# Patient Record
Sex: Male | Born: 1960
Health system: Southern US, Community
[De-identification: ages and names within clinical notes are randomized; demographics above are authoritative.]

## PROBLEM LIST (undated history)

## (undated) DIAGNOSIS — R932 Abnormal findings on diagnostic imaging of liver and biliary tract: Secondary | ICD-10-CM

## (undated) DIAGNOSIS — F329 Major depressive disorder, single episode, unspecified: Secondary | ICD-10-CM

## (undated) DIAGNOSIS — K766 Portal hypertension: Secondary | ICD-10-CM

## (undated) DIAGNOSIS — Z8619 Personal history of other infectious and parasitic diseases: Secondary | ICD-10-CM

## (undated) DIAGNOSIS — F319 Bipolar disorder, unspecified: Secondary | ICD-10-CM

## (undated) DIAGNOSIS — F419 Anxiety disorder, unspecified: Secondary | ICD-10-CM

## (undated) DIAGNOSIS — K3189 Other diseases of stomach and duodenum: Secondary | ICD-10-CM

## (undated) DIAGNOSIS — M539 Dorsopathy, unspecified: Secondary | ICD-10-CM

## (undated) DIAGNOSIS — I10 Essential (primary) hypertension: Secondary | ICD-10-CM

## (undated) DIAGNOSIS — Z972 Presence of dental prosthetic device (complete) (partial): Secondary | ICD-10-CM

## (undated) DIAGNOSIS — K0889 Other specified disorders of teeth and supporting structures: Secondary | ICD-10-CM

## (undated) DIAGNOSIS — M489 Spondylopathy, unspecified: Secondary | ICD-10-CM

## (undated) DIAGNOSIS — K7460 Unspecified cirrhosis of liver: Secondary | ICD-10-CM

## (undated) DIAGNOSIS — I8500 Esophageal varices without bleeding: Secondary | ICD-10-CM

## (undated) DIAGNOSIS — K2990 Gastroduodenitis, unspecified, without bleeding: Secondary | ICD-10-CM

## (undated) DIAGNOSIS — F4310 Post-traumatic stress disorder, unspecified: Secondary | ICD-10-CM

## (undated) DIAGNOSIS — K219 Gastro-esophageal reflux disease without esophagitis: Secondary | ICD-10-CM

## (undated) DIAGNOSIS — R519 Headache, unspecified: Secondary | ICD-10-CM

## (undated) DIAGNOSIS — R51 Headache: Secondary | ICD-10-CM

## (undated) DIAGNOSIS — F32A Depression, unspecified: Secondary | ICD-10-CM

## (undated) HISTORY — DX: Abnormal findings on diagnostic imaging of liver and biliary tract: R93.2

## (undated) HISTORY — DX: Gastro-esophageal reflux disease without esophagitis: K21.9

## (undated) HISTORY — DX: Major depressive disorder, single episode, unspecified: F32.9

## (undated) HISTORY — DX: Bipolar disorder, unspecified: F31.9

## (undated) HISTORY — PX: FACIAL RECONSTRUCTION SURGERY: SHX631

## (undated) HISTORY — DX: Depression, unspecified: F32.A

## (undated) HISTORY — DX: Essential (primary) hypertension: I10

---

## 2011-04-23 DIAGNOSIS — IMO0002 Reserved for concepts with insufficient information to code with codable children: Secondary | ICD-10-CM | POA: Diagnosis not present

## 2011-04-23 DIAGNOSIS — M7989 Other specified soft tissue disorders: Secondary | ICD-10-CM | POA: Diagnosis not present

## 2011-04-26 DIAGNOSIS — A419 Sepsis, unspecified organism: Secondary | ICD-10-CM | POA: Diagnosis not present

## 2011-04-26 DIAGNOSIS — A491 Streptococcal infection, unspecified site: Secondary | ICD-10-CM | POA: Diagnosis present

## 2011-04-26 DIAGNOSIS — F319 Bipolar disorder, unspecified: Secondary | ICD-10-CM | POA: Diagnosis not present

## 2011-04-26 DIAGNOSIS — F17200 Nicotine dependence, unspecified, uncomplicated: Secondary | ICD-10-CM | POA: Diagnosis present

## 2011-04-26 DIAGNOSIS — M79609 Pain in unspecified limb: Secondary | ICD-10-CM | POA: Diagnosis not present

## 2011-04-26 DIAGNOSIS — M549 Dorsalgia, unspecified: Secondary | ICD-10-CM | POA: Diagnosis not present

## 2011-04-26 DIAGNOSIS — IMO0002 Reserved for concepts with insufficient information to code with codable children: Secondary | ICD-10-CM | POA: Diagnosis not present

## 2011-04-26 DIAGNOSIS — Z7189 Other specified counseling: Secondary | ICD-10-CM | POA: Diagnosis not present

## 2012-05-15 DIAGNOSIS — J019 Acute sinusitis, unspecified: Secondary | ICD-10-CM | POA: Diagnosis not present

## 2012-06-13 DIAGNOSIS — F17200 Nicotine dependence, unspecified, uncomplicated: Secondary | ICD-10-CM | POA: Diagnosis not present

## 2012-06-13 DIAGNOSIS — F411 Generalized anxiety disorder: Secondary | ICD-10-CM | POA: Diagnosis not present

## 2012-06-13 DIAGNOSIS — H53149 Visual discomfort, unspecified: Secondary | ICD-10-CM | POA: Diagnosis not present

## 2012-06-13 DIAGNOSIS — G43909 Migraine, unspecified, not intractable, without status migrainosus: Secondary | ICD-10-CM | POA: Diagnosis not present

## 2012-06-13 DIAGNOSIS — G44009 Cluster headache syndrome, unspecified, not intractable: Secondary | ICD-10-CM | POA: Diagnosis not present

## 2012-07-26 DIAGNOSIS — R197 Diarrhea, unspecified: Secondary | ICD-10-CM | POA: Diagnosis not present

## 2012-09-30 DIAGNOSIS — R51 Headache: Secondary | ICD-10-CM | POA: Diagnosis not present

## 2012-09-30 DIAGNOSIS — M549 Dorsalgia, unspecified: Secondary | ICD-10-CM | POA: Diagnosis not present

## 2012-09-30 DIAGNOSIS — L259 Unspecified contact dermatitis, unspecified cause: Secondary | ICD-10-CM | POA: Diagnosis not present

## 2012-09-30 DIAGNOSIS — I10 Essential (primary) hypertension: Secondary | ICD-10-CM | POA: Diagnosis not present

## 2012-10-24 DIAGNOSIS — R7989 Other specified abnormal findings of blood chemistry: Secondary | ICD-10-CM | POA: Diagnosis not present

## 2012-10-24 DIAGNOSIS — R51 Headache: Secondary | ICD-10-CM | POA: Diagnosis not present

## 2012-10-24 DIAGNOSIS — I10 Essential (primary) hypertension: Secondary | ICD-10-CM | POA: Diagnosis not present

## 2012-11-01 DIAGNOSIS — M549 Dorsalgia, unspecified: Secondary | ICD-10-CM | POA: Diagnosis not present

## 2012-11-01 DIAGNOSIS — B1920 Unspecified viral hepatitis C without hepatic coma: Secondary | ICD-10-CM | POA: Diagnosis not present

## 2012-11-01 DIAGNOSIS — I10 Essential (primary) hypertension: Secondary | ICD-10-CM | POA: Diagnosis not present

## 2012-11-20 ENCOUNTER — Ambulatory Visit: Payer: Self-pay | Admitting: Surgery

## 2012-11-20 DIAGNOSIS — B182 Chronic viral hepatitis C: Secondary | ICD-10-CM | POA: Diagnosis not present

## 2012-11-20 LAB — PROTIME-INR
INR: 1
Prothrombin Time: 13.5 secs (ref 11.5–14.7)

## 2012-11-20 LAB — FERRITIN: Ferritin (ARMC): 478 ng/mL — ABNORMAL HIGH (ref 8–388)

## 2012-11-25 ENCOUNTER — Ambulatory Visit: Payer: Self-pay | Admitting: Gastroenterology

## 2012-11-25 DIAGNOSIS — B182 Chronic viral hepatitis C: Secondary | ICD-10-CM | POA: Diagnosis not present

## 2012-11-25 DIAGNOSIS — B1920 Unspecified viral hepatitis C without hepatic coma: Secondary | ICD-10-CM | POA: Diagnosis not present

## 2012-12-06 DIAGNOSIS — M549 Dorsalgia, unspecified: Secondary | ICD-10-CM | POA: Diagnosis not present

## 2012-12-06 DIAGNOSIS — J309 Allergic rhinitis, unspecified: Secondary | ICD-10-CM | POA: Diagnosis not present

## 2012-12-06 DIAGNOSIS — I10 Essential (primary) hypertension: Secondary | ICD-10-CM | POA: Diagnosis not present

## 2012-12-06 DIAGNOSIS — F411 Generalized anxiety disorder: Secondary | ICD-10-CM | POA: Diagnosis not present

## 2012-12-18 ENCOUNTER — Ambulatory Visit: Payer: Self-pay | Admitting: Pain Medicine

## 2012-12-18 DIAGNOSIS — M545 Low back pain, unspecified: Secondary | ICD-10-CM | POA: Diagnosis not present

## 2012-12-18 DIAGNOSIS — F411 Generalized anxiety disorder: Secondary | ICD-10-CM | POA: Diagnosis not present

## 2012-12-18 DIAGNOSIS — IMO0002 Reserved for concepts with insufficient information to code with codable children: Secondary | ICD-10-CM | POA: Diagnosis not present

## 2012-12-18 DIAGNOSIS — F3289 Other specified depressive episodes: Secondary | ICD-10-CM | POA: Diagnosis not present

## 2012-12-18 DIAGNOSIS — F410 Panic disorder [episodic paroxysmal anxiety] without agoraphobia: Secondary | ICD-10-CM | POA: Diagnosis not present

## 2012-12-18 DIAGNOSIS — M47817 Spondylosis without myelopathy or radiculopathy, lumbosacral region: Secondary | ICD-10-CM | POA: Diagnosis not present

## 2012-12-18 DIAGNOSIS — F329 Major depressive disorder, single episode, unspecified: Secondary | ICD-10-CM | POA: Diagnosis not present

## 2012-12-18 DIAGNOSIS — M79609 Pain in unspecified limb: Secondary | ICD-10-CM | POA: Diagnosis not present

## 2013-01-08 DIAGNOSIS — F312 Bipolar disorder, current episode manic severe with psychotic features: Secondary | ICD-10-CM | POA: Diagnosis not present

## 2013-01-08 DIAGNOSIS — K219 Gastro-esophageal reflux disease without esophagitis: Secondary | ICD-10-CM | POA: Diagnosis not present

## 2013-01-08 DIAGNOSIS — I10 Essential (primary) hypertension: Secondary | ICD-10-CM | POA: Diagnosis not present

## 2013-01-08 DIAGNOSIS — Z23 Encounter for immunization: Secondary | ICD-10-CM | POA: Diagnosis not present

## 2013-04-02 DIAGNOSIS — I10 Essential (primary) hypertension: Secondary | ICD-10-CM | POA: Diagnosis not present

## 2013-04-02 DIAGNOSIS — M549 Dorsalgia, unspecified: Secondary | ICD-10-CM | POA: Diagnosis not present

## 2013-04-02 DIAGNOSIS — L0291 Cutaneous abscess, unspecified: Secondary | ICD-10-CM | POA: Diagnosis not present

## 2013-04-02 DIAGNOSIS — J398 Other specified diseases of upper respiratory tract: Secondary | ICD-10-CM | POA: Diagnosis not present

## 2013-04-10 DIAGNOSIS — K219 Gastro-esophageal reflux disease without esophagitis: Secondary | ICD-10-CM | POA: Diagnosis not present

## 2013-04-10 DIAGNOSIS — F312 Bipolar disorder, current episode manic severe with psychotic features: Secondary | ICD-10-CM | POA: Diagnosis not present

## 2013-04-10 DIAGNOSIS — M549 Dorsalgia, unspecified: Secondary | ICD-10-CM | POA: Diagnosis not present

## 2013-04-10 DIAGNOSIS — I10 Essential (primary) hypertension: Secondary | ICD-10-CM | POA: Diagnosis not present

## 2013-05-12 DIAGNOSIS — H40019 Open angle with borderline findings, low risk, unspecified eye: Secondary | ICD-10-CM | POA: Diagnosis not present

## 2013-07-11 DIAGNOSIS — F312 Bipolar disorder, current episode manic severe with psychotic features: Secondary | ICD-10-CM | POA: Diagnosis not present

## 2013-07-11 DIAGNOSIS — K219 Gastro-esophageal reflux disease without esophagitis: Secondary | ICD-10-CM | POA: Diagnosis not present

## 2013-07-11 DIAGNOSIS — I10 Essential (primary) hypertension: Secondary | ICD-10-CM | POA: Diagnosis not present

## 2013-07-11 DIAGNOSIS — M549 Dorsalgia, unspecified: Secondary | ICD-10-CM | POA: Diagnosis not present

## 2013-07-15 DIAGNOSIS — F3132 Bipolar disorder, current episode depressed, moderate: Secondary | ICD-10-CM | POA: Diagnosis not present

## 2013-07-21 ENCOUNTER — Emergency Department: Payer: Self-pay | Admitting: Emergency Medicine

## 2013-07-21 DIAGNOSIS — M25469 Effusion, unspecified knee: Secondary | ICD-10-CM | POA: Diagnosis not present

## 2013-07-21 DIAGNOSIS — F17200 Nicotine dependence, unspecified, uncomplicated: Secondary | ICD-10-CM | POA: Diagnosis not present

## 2013-07-21 DIAGNOSIS — M1610 Unilateral primary osteoarthritis, unspecified hip: Secondary | ICD-10-CM | POA: Diagnosis not present

## 2013-07-21 DIAGNOSIS — I10 Essential (primary) hypertension: Secondary | ICD-10-CM | POA: Diagnosis not present

## 2013-07-21 DIAGNOSIS — M25459 Effusion, unspecified hip: Secondary | ICD-10-CM | POA: Diagnosis not present

## 2013-07-21 DIAGNOSIS — M169 Osteoarthritis of hip, unspecified: Secondary | ICD-10-CM | POA: Diagnosis not present

## 2013-07-24 ENCOUNTER — Emergency Department: Payer: Self-pay | Admitting: Emergency Medicine

## 2013-07-24 DIAGNOSIS — G8929 Other chronic pain: Secondary | ICD-10-CM | POA: Diagnosis not present

## 2013-07-24 DIAGNOSIS — M25559 Pain in unspecified hip: Secondary | ICD-10-CM | POA: Diagnosis not present

## 2013-07-24 DIAGNOSIS — I10 Essential (primary) hypertension: Secondary | ICD-10-CM | POA: Diagnosis not present

## 2013-07-24 DIAGNOSIS — M25569 Pain in unspecified knee: Secondary | ICD-10-CM | POA: Diagnosis not present

## 2013-07-24 DIAGNOSIS — F17200 Nicotine dependence, unspecified, uncomplicated: Secondary | ICD-10-CM | POA: Diagnosis not present

## 2013-08-01 ENCOUNTER — Encounter: Payer: Self-pay | Admitting: Physical Medicine & Rehabilitation

## 2013-08-20 ENCOUNTER — Emergency Department: Payer: Self-pay | Admitting: Emergency Medicine

## 2013-08-20 DIAGNOSIS — F17200 Nicotine dependence, unspecified, uncomplicated: Secondary | ICD-10-CM | POA: Diagnosis not present

## 2013-08-20 DIAGNOSIS — S61209A Unspecified open wound of unspecified finger without damage to nail, initial encounter: Secondary | ICD-10-CM | POA: Diagnosis not present

## 2013-08-20 DIAGNOSIS — Z79899 Other long term (current) drug therapy: Secondary | ICD-10-CM | POA: Diagnosis not present

## 2013-08-20 DIAGNOSIS — S6000XA Contusion of unspecified finger without damage to nail, initial encounter: Secondary | ICD-10-CM | POA: Diagnosis not present

## 2013-08-20 DIAGNOSIS — I10 Essential (primary) hypertension: Secondary | ICD-10-CM | POA: Diagnosis not present

## 2013-09-05 ENCOUNTER — Encounter: Payer: Self-pay | Admitting: Physical Medicine & Rehabilitation

## 2013-09-05 ENCOUNTER — Ambulatory Visit (HOSPITAL_BASED_OUTPATIENT_CLINIC_OR_DEPARTMENT_OTHER): Payer: Medicare Other | Admitting: Physical Medicine & Rehabilitation

## 2013-09-05 ENCOUNTER — Encounter: Payer: Medicare Other | Attending: Physical Medicine & Rehabilitation

## 2013-09-05 VITALS — BP 136/95 | HR 81 | Resp 14 | Wt 243.8 lb

## 2013-09-05 DIAGNOSIS — M545 Low back pain, unspecified: Secondary | ICD-10-CM | POA: Diagnosis not present

## 2013-09-05 DIAGNOSIS — K219 Gastro-esophageal reflux disease without esophagitis: Secondary | ICD-10-CM | POA: Diagnosis not present

## 2013-09-05 DIAGNOSIS — M25569 Pain in unspecified knee: Secondary | ICD-10-CM

## 2013-09-05 DIAGNOSIS — Z5181 Encounter for therapeutic drug level monitoring: Secondary | ICD-10-CM

## 2013-09-05 DIAGNOSIS — F319 Bipolar disorder, unspecified: Secondary | ICD-10-CM | POA: Insufficient documentation

## 2013-09-05 DIAGNOSIS — M549 Dorsalgia, unspecified: Secondary | ICD-10-CM | POA: Diagnosis not present

## 2013-09-05 DIAGNOSIS — R209 Unspecified disturbances of skin sensation: Secondary | ICD-10-CM | POA: Insufficient documentation

## 2013-09-05 DIAGNOSIS — G8929 Other chronic pain: Secondary | ICD-10-CM | POA: Diagnosis not present

## 2013-09-05 DIAGNOSIS — I10 Essential (primary) hypertension: Secondary | ICD-10-CM | POA: Insufficient documentation

## 2013-09-05 DIAGNOSIS — Z79899 Other long term (current) drug therapy: Secondary | ICD-10-CM | POA: Diagnosis not present

## 2013-09-05 DIAGNOSIS — S22000A Wedge compression fracture of unspecified thoracic vertebra, initial encounter for closed fracture: Secondary | ICD-10-CM | POA: Insufficient documentation

## 2013-09-05 DIAGNOSIS — S22009A Unspecified fracture of unspecified thoracic vertebra, initial encounter for closed fracture: Secondary | ICD-10-CM

## 2013-09-05 DIAGNOSIS — F17200 Nicotine dependence, unspecified, uncomplicated: Secondary | ICD-10-CM | POA: Insufficient documentation

## 2013-09-05 DIAGNOSIS — M25559 Pain in unspecified hip: Secondary | ICD-10-CM | POA: Diagnosis not present

## 2013-09-05 MED ORDER — TRAMADOL HCL 50 MG PO TABS: 50.0000 mg | ORAL_TABLET | Freq: Four times a day (QID) | ORAL | Status: DC

## 2013-09-05 NOTE — Patient Instructions (Signed)
Carpal Tunnel Syndrome The carpal tunnel is an area under the skin of the palm of your hand. Nerves, blood vessels, and strong tissues (tendons) pass through the tunnel. The tunnel can become puffy (swollen). If this happens, a nerve can be pinched in the wrist. This causes carpal tunnel syndrome.  HOME CARE  Take all medicine as told by your doctor.  If you were given a splint, wear it as told. Wear it at night or at times when your doctor told you to.  Rest your wrist from the activity that causes your pain.  Put ice on your wrist after long periods of wrist activity.  Put ice in a plastic bag.  Place a towel between your skin and the bag.  Leave the ice on for 15-20 minutes, 03-04 times a day.  Keep all doctor visits as told. GET HELP RIGHT AWAY IF:  You have new problems you cannot explain.  Your problems get worse and medicine does not help. MAKE SURE YOU:   Understand these instructions.  Will watch your condition.  Will get help right away if you are not doing well or get worse. Document Released: 03/09/2011 Document Revised: 06/12/2011 Document Reviewed: 03/09/2011 ExitCare Patient Information 2014 [employer].  

## 2013-09-05 NOTE — Progress Notes (Signed)
Subjective:    Patient ID: Christian Sanders, male    DOB: 12/13/1960, 53 y.o.   MRN: [id number]  HPI Chief complaint mid back pain Onset of pain 2001 after a fall. Reviewed MRIs from 2008 in 2009 which showed compression fracture, old at either T78 or T89, patient has not had any surgery for this in the past. Back pain is worse early in the morning as well as later in the evening. Standing bending and lifting seems to aggravate the pain.  Also has left hip pain that radiates down to the knee. X-rays from earlier this year demonstrate mild left sacroiliac joint arthritis, no knee osteoarthritis  On disability since 2010 for back pain and bipolar disorder.  Currently sees a psychiatrist for bipolar disorder. Also sees primary care physician for hypertension  Pain Inventory Average Pain 10 Pain Right Now 10 My pain is sharp, burning, dull, stabbing and aching  In the last 24 hours, has pain interfered with the following? General activity 10 Relation with others 8 Enjoyment of life 10 What TIME of day is your pain at its worst? all Sleep (in general) Poor  Pain is worse with: walking, bending, sitting, inactivity, standing, unsure and some activites Pain improves with: medication Relief from Meds: 8  Mobility walk without assistance how many minutes can you walk? 10 ability to climb steps?  yes do you drive?  yes  Function disabled: date disabled 2010 I need assistance with the following:  household duties and shopping  Neuro/Psych weakness numbness tingling trouble walking spasms dizziness depression anxiety  Prior Studies Any changes since last visit?  yes  Physicians involved in your care Primary care Venora MaplesJames Hawkins   No family history on file. History   Social History  . Marital Status: Unknown    Spouse Name: N/A    Number of Children: N/A  . Years of Education: N/A   Social History Main Topics  . Smoking status: Current Every Day Smoker -- 0.50  packs/day for 30 years    Types: Cigarettes  . Smokeless tobacco: Never Used  . Alcohol Use: None  . Drug Use: None  . Sexual Activity: None   Other Topics Concern  . None   Social History Narrative  . None   Past Surgical History  Procedure Laterality Date  . Facial reconstruction surgery      eye socket with 2 metal plates   Past Medical History  Diagnosis Date  . Hypertension   . GERD (gastroesophageal reflux disease)   . Depression   . Bipolar disorder    BP 136/95  Pulse 81  Resp 14  Wt 243 lb 12.8 oz (110.587 kg)  SpO2 97%  Opioid Risk Score: 3 Fall Risk Score: Low Fall Risk (0-5 points) (educated and handout on fall prevention inthe home was given) Review of Systems  Constitutional: Positive for unexpected weight change.  Gastrointestinal: Positive for diarrhea.  Musculoskeletal: Positive for gait problem.       Spasms  Neurological: Positive for dizziness, weakness and numbness.       Tingling  Psychiatric/Behavioral: Positive for dysphoric mood. The patient is nervous/anxious.   All other systems reviewed and are negative.      Objective:   Physical Exam  Nursing note and vitals reviewed. Constitutional: He is oriented to person, place, and time. He appears well-developed and well-nourished.  HENT:  Head: Normocephalic and atraumatic.  Eyes: Conjunctivae and EOM are normal. Pupils are equal, round, and reactive to light.  Neck:  Normal range of motion. Neck supple.  Cardiovascular: Normal rate, regular rhythm and normal heart sounds.   Pulmonary/Chest: Effort normal and breath sounds normal.  Abdominal: Soft. Bowel sounds are normal.  Musculoskeletal:       Right shoulder: Normal.       Right hip: Normal.       Left hip: Normal.       Right knee: Normal.       Left knee: Normal.       Thoracic back: He exhibits decreased range of motion.       Lumbar back: He exhibits decreased range of motion.  Phalen's Positive  Neurological: He is alert and  oriented to person, place, and time. He has normal strength. No sensory deficit. He displays a negative Romberg sign.  Reflex Scores:      Tricep reflexes are 1+ on the right side and 1+ on the left side.      Bicep reflexes are 1+ on the right side and 1+ on the left side.      Brachioradialis reflexes are 1+ on the right side and 1+ on the left side.      Patellar reflexes are 1+ on the right side and 1+ on the left side.      Achilles reflexes are 1+ on the right side and 1+ on the left side. Psychiatric: He has a normal mood and affect.          Assessment & Plan:  1. Chronic mid back pain. Has chronic depression deformity around T 7-T8 which is likely causing abnormal biomechanical stress to that area. Has been on Percocet in the past. Has not tried other narcotic analgesics. Opioid risks score is low  Recommend multimodal therapy including exercise, medication and injection. Steps approach to narcotic analgesics starting with tramadol 50 mg 4 times per day. If this is not helpful would go up to Tylenol #3  May need another round of physical therapy for thoracic extension exercises  2. Bilateral hand numbness with positive Phalen's sign, suspect carpal tunnel but we'll need to EMG to further assess in the meantime can start with wrist orthosis at night  3. Left-sided low back pain radiating to the hip as well as knee. Do not think he has primary hip or knee pathology based on x-rays as well as examination. This may be radiating pain from the low back or the sacroiliac area. We'll recommend sacroiliac injections given arthrosis noted on x-ray

## 2013-09-09 ENCOUNTER — Telehealth: Payer: Self-pay

## 2013-09-09 NOTE — Telephone Encounter (Signed)
Contacted patient's EC to inform her that Dr. Wynn Banker advised patient to stop the Tramadol for 2 days to see if the symptoms go away. Patient is nauseated and has a rash. Patient will notify us in 2 days and let us know if the symptoms go away.

## 2013-09-09 NOTE — Telephone Encounter (Signed)
Stop tramadol for 2 days and see if symptoms go away Please clarify is this nausea or vomiting?

## 2013-09-09 NOTE — Telephone Encounter (Signed)
Christian Sanders called on behalf of patient.  He thinks the tramadol is making him sick.  They would like to know what to do?

## 2013-09-09 NOTE — Telephone Encounter (Signed)
Attempted to contact patient/EC. Left a voicemail to return call to clinic.

## 2013-09-12 ENCOUNTER — Telehealth: Payer: Self-pay

## 2013-09-12 NOTE — Telephone Encounter (Signed)
Christian Sanders, patients fiance called to let us know that stopping the tramadol has stopped the rash.  They would like to know what to do next.  Please advise.

## 2013-09-12 NOTE — Telephone Encounter (Signed)
Advised patient we would need to get urine drug screen back before any other medication could be prescribed.

## 2013-09-12 NOTE — Telephone Encounter (Signed)
Need results of your Urine drug screen prior to a stronger narcotic such as Tylenol #3

## 2013-09-18 ENCOUNTER — Telehealth: Payer: Self-pay

## 2013-09-18 NOTE — Telephone Encounter (Signed)
Christian Sanders called on pt's behalf requesting a stronger medication for patient. Informed patient that per Dr. Wynn BankerKirsteins he would need to be seen before prescribing anything stronger. The reason being is Dr. Wynn BankerKirsteins wants a UDS did not inform pt that he needed a UDS though.

## 2013-09-30 DIAGNOSIS — F315 Bipolar disorder, current episode depressed, severe, with psychotic features: Secondary | ICD-10-CM | POA: Diagnosis not present

## 2013-10-14 DIAGNOSIS — M549 Dorsalgia, unspecified: Secondary | ICD-10-CM | POA: Diagnosis not present

## 2013-10-14 DIAGNOSIS — I10 Essential (primary) hypertension: Secondary | ICD-10-CM | POA: Diagnosis not present

## 2013-10-14 DIAGNOSIS — K219 Gastro-esophageal reflux disease without esophagitis: Secondary | ICD-10-CM | POA: Diagnosis not present

## 2013-10-14 DIAGNOSIS — F312 Bipolar disorder, current episode manic severe with psychotic features: Secondary | ICD-10-CM | POA: Diagnosis not present

## 2013-10-16 DIAGNOSIS — F315 Bipolar disorder, current episode depressed, severe, with psychotic features: Secondary | ICD-10-CM | POA: Diagnosis not present

## 2013-10-23 ENCOUNTER — Encounter: Payer: Medicare Other | Attending: Physical Medicine & Rehabilitation

## 2013-10-23 ENCOUNTER — Encounter: Payer: Self-pay | Admitting: Physical Medicine & Rehabilitation

## 2013-10-23 ENCOUNTER — Ambulatory Visit (HOSPITAL_BASED_OUTPATIENT_CLINIC_OR_DEPARTMENT_OTHER): Payer: Medicare Other | Admitting: Physical Medicine & Rehabilitation

## 2013-10-23 VITALS — BP 166/97 | HR 99 | Resp 16 | Ht 74.0 in | Wt 245.0 lb

## 2013-10-23 DIAGNOSIS — M25569 Pain in unspecified knee: Secondary | ICD-10-CM | POA: Insufficient documentation

## 2013-10-23 DIAGNOSIS — M549 Dorsalgia, unspecified: Secondary | ICD-10-CM | POA: Insufficient documentation

## 2013-10-23 DIAGNOSIS — F319 Bipolar disorder, unspecified: Secondary | ICD-10-CM | POA: Insufficient documentation

## 2013-10-23 DIAGNOSIS — I10 Essential (primary) hypertension: Secondary | ICD-10-CM | POA: Diagnosis not present

## 2013-10-23 DIAGNOSIS — F17200 Nicotine dependence, unspecified, uncomplicated: Secondary | ICD-10-CM | POA: Insufficient documentation

## 2013-10-23 DIAGNOSIS — K219 Gastro-esophageal reflux disease without esophagitis: Secondary | ICD-10-CM | POA: Insufficient documentation

## 2013-10-23 DIAGNOSIS — M25559 Pain in unspecified hip: Secondary | ICD-10-CM | POA: Insufficient documentation

## 2013-10-23 DIAGNOSIS — M79642 Pain in left hand: Secondary | ICD-10-CM

## 2013-10-23 DIAGNOSIS — R209 Unspecified disturbances of skin sensation: Secondary | ICD-10-CM | POA: Diagnosis not present

## 2013-10-23 DIAGNOSIS — M79609 Pain in unspecified limb: Secondary | ICD-10-CM

## 2013-10-23 DIAGNOSIS — G8929 Other chronic pain: Secondary | ICD-10-CM | POA: Insufficient documentation

## 2013-10-23 MED ORDER — ACETAMINOPHEN-CODEINE #3 300-30 MG PO TABS: 1.0000 | ORAL_TABLET | Freq: Three times a day (TID) | ORAL | Status: DC | PRN

## 2013-10-23 NOTE — Patient Instructions (Signed)
Electromyography (EMG) Test This is a test in which very small electrodes are placed into your muscle tissue. It looks at the electrical impulses of your muscle tissue. This test is used to determine whether or not there are involuntary or spontaneous muscle movements. Involuntary or spontaneous means muscle movements that happen by themselves. This may indicate injury or disease of the nerves which supply that muscle. PREPARATION FOR TEST No preparation or fasting is necessary. Some stimulants such as caffeine and tobacco may need to be avoided for 2-3 hours before test or as instructed by your caregiver. NORMAL FINDINGS No evidence of neuromuscular abnormalities. Ranges for normal findings may vary among different laboratories and hospitals. You should always check with your doctor after having lab work or other tests done to discuss the meaning of your test results and whether your values are considered within normal limits. MEANING OF TEST  Your caregiver will go over the test results with you and discuss the importance and meaning of your results, as well as treatment options and the need for additional tests if necessary. OBTAINING THE TEST RESULTS It is your responsibility to obtain your test results. Ask the lab or department performing the test when and how you will get your results. Document Released: 07/21/2004 Document Revised: 06/12/2011 Document Reviewed: 02/28/2008 ExitCare Patient Information 2015 [employer]. This information is not intended to replace advice given to you by your health care provider. Make sure you discuss any questions you have with your health care provider.  

## 2013-10-23 NOTE — Progress Notes (Signed)
EMG performed 10/23/2013.  See EMG report under media tab.

## 2013-12-11 ENCOUNTER — Ambulatory Visit: Payer: Medicare Other | Admitting: Physical Medicine & Rehabilitation

## 2013-12-12 DIAGNOSIS — J069 Acute upper respiratory infection, unspecified: Secondary | ICD-10-CM | POA: Diagnosis not present

## 2013-12-12 DIAGNOSIS — F312 Bipolar disorder, current episode manic severe with psychotic features: Secondary | ICD-10-CM | POA: Diagnosis not present

## 2013-12-12 DIAGNOSIS — J329 Chronic sinusitis, unspecified: Secondary | ICD-10-CM | POA: Diagnosis not present

## 2013-12-16 ENCOUNTER — Ambulatory Visit (HOSPITAL_BASED_OUTPATIENT_CLINIC_OR_DEPARTMENT_OTHER): Payer: Medicare Other | Admitting: Physical Medicine & Rehabilitation

## 2013-12-16 ENCOUNTER — Encounter: Payer: Self-pay | Admitting: Physical Medicine & Rehabilitation

## 2013-12-16 ENCOUNTER — Encounter: Payer: Medicare Other | Attending: Physical Medicine & Rehabilitation

## 2013-12-16 VITALS — BP 139/84 | HR 83 | Resp 16 | Ht 75.0 in | Wt 247.0 lb

## 2013-12-16 DIAGNOSIS — M549 Dorsalgia, unspecified: Secondary | ICD-10-CM

## 2013-12-16 DIAGNOSIS — G8929 Other chronic pain: Secondary | ICD-10-CM | POA: Diagnosis not present

## 2013-12-16 DIAGNOSIS — R209 Unspecified disturbances of skin sensation: Secondary | ICD-10-CM | POA: Insufficient documentation

## 2013-12-16 DIAGNOSIS — M25569 Pain in unspecified knee: Secondary | ICD-10-CM | POA: Insufficient documentation

## 2013-12-16 DIAGNOSIS — F319 Bipolar disorder, unspecified: Secondary | ICD-10-CM | POA: Diagnosis not present

## 2013-12-16 DIAGNOSIS — I10 Essential (primary) hypertension: Secondary | ICD-10-CM | POA: Diagnosis not present

## 2013-12-16 DIAGNOSIS — F17200 Nicotine dependence, unspecified, uncomplicated: Secondary | ICD-10-CM | POA: Diagnosis not present

## 2013-12-16 DIAGNOSIS — K219 Gastro-esophageal reflux disease without esophagitis: Secondary | ICD-10-CM | POA: Insufficient documentation

## 2013-12-16 DIAGNOSIS — IMO0002 Reserved for concepts with insufficient information to code with codable children: Secondary | ICD-10-CM

## 2013-12-16 DIAGNOSIS — M25559 Pain in unspecified hip: Secondary | ICD-10-CM | POA: Insufficient documentation

## 2013-12-16 DIAGNOSIS — M546 Pain in thoracic spine: Secondary | ICD-10-CM

## 2013-12-16 DIAGNOSIS — S22000S Wedge compression fracture of unspecified thoracic vertebra, sequela: Secondary | ICD-10-CM

## 2013-12-16 MED ORDER — ACETAMINOPHEN-CODEINE #3 300-30 MG PO TABS: 1.0000 | ORAL_TABLET | Freq: Three times a day (TID) | ORAL | Status: DC | PRN

## 2013-12-16 NOTE — Patient Instructions (Signed)
Continue walking every day.

## 2013-12-16 NOTE — Progress Notes (Signed)
Subjective:    Patient ID: Christian Sanders, male    DOB: [birth year], 53 y.o.   MRN: [id number]  HPI Hand pain and finger pain improving Discussed EMG results from 10/23/2013. This was essentially a normal study. No further workup of hand pain needed unless symptoms increased once again Still walking 15-36min, 7days a week  Pain Inventory Average Pain 8 Pain Right Now 7 My pain is sharp, burning, stabbing and aching  In the last 24 hours, has pain interfered with the following? General activity 6 Relation with others 8 Enjoyment of life 8 What TIME of day is your pain at its worst? morning,evening Sleep (in general) Poor  Pain is worse with: walking, bending, sitting, inactivity, standing, unsure and some activites Pain improves with: medication Relief from Meds: 7  Mobility walk without assistance transfers alone  Function disabled: date disabled na  Neuro/Psych weakness numbness dizziness confusion depression anxiety  Prior Studies Any changes since last visit?  no  Physicians involved in your care Any changes since last visit?  no   History reviewed. No pertinent family history. History   Social History  . Marital Status: Unknown    Spouse Name: N/A    Number of Children: N/A  . Years of Education: N/A   Social History Main Topics  . Smoking status: Current Every Day Smoker -- 0.50 packs/day for 30 years    Types: Cigarettes  . Smokeless tobacco: Never Used  . Alcohol Use: None  . Drug Use: None  . Sexual Activity: None   Other Topics Concern  . None   Social History Narrative  . None   Past Surgical History  Procedure Laterality Date  . Facial reconstruction surgery      eye socket with 2 metal plates   Past Medical History  Diagnosis Date  . Hypertension   . GERD (gastroesophageal reflux disease)   . Depression   . Bipolar disorder    BP 139/84  Pulse 83  Resp 16  Ht  (1.905 m)  Wt 247 lb (112.038 kg)  BMI 30.87 kg/m2  SpO2  98%  Opioid Risk Score:   Fall Risk Score: Low Fall Risk (0-5 points)   Review of Systems  Constitutional: Positive for chills, diaphoresis and unexpected weight change.  Gastrointestinal: Positive for diarrhea.  Neurological: Positive for dizziness, weakness and numbness.  Psychiatric/Behavioral: Positive for confusion. The patient is nervous/anxious.        Anxiety  All other systems reviewed and are negative.      Objective:   Physical Exam  Nursing note and vitals reviewed. Constitutional: He is oriented to person, place, and time. He appears well-developed and well-nourished.  HENT:  Head: Normocephalic and atraumatic.  Eyes: Pupils are equal, round, and reactive to light.  Neurological: He is alert and oriented to person, place, and time.  Psychiatric: He has a normal mood and affect.    No evidence of joint tenderness or joint swelling Negative Tinel's test, at the wrist Normal sensation to light touch and pinprick in both hands No evidence of hand intrinsic atrophy  Tenderness to palpation around ET 67 area     Assessment & Plan:  1. Chronic mid back pain. Has chronic depression deformity around T 7-T8 which is likely causing abnormal biomechanical stress to that area. Has been on Percocet in the past. Has not tried other narcotic analgesics. Opioid risks score is low  Recommend multimodal therapy including exercise, medication and injection. Responsive to Tylenol #3  May need another round of physical therapy for thoracic extension exercises  2. Bilateral hand numbness improved. No evidence of compression neuropathy in the upper extremities. No need for repeat EMG at this point  3. Left-sided low back pain radiating to the hip as well as knee at this point appears to be well controlled on current medication and exercise program.. We'll recommend sacroiliac injections if this becomes more severe given arthrosis noted on x-ray

## 2013-12-17 ENCOUNTER — Encounter: Payer: Self-pay | Admitting: Physical Medicine & Rehabilitation

## 2013-12-18 ENCOUNTER — Encounter: Payer: Self-pay | Admitting: Physical Medicine & Rehabilitation

## 2013-12-23 DIAGNOSIS — Z79899 Other long term (current) drug therapy: Secondary | ICD-10-CM | POA: Diagnosis not present

## 2013-12-23 DIAGNOSIS — F315 Bipolar disorder, current episode depressed, severe, with psychotic features: Secondary | ICD-10-CM | POA: Diagnosis not present

## 2013-12-24 DIAGNOSIS — J329 Chronic sinusitis, unspecified: Secondary | ICD-10-CM | POA: Diagnosis not present

## 2014-01-26 DIAGNOSIS — Z23 Encounter for immunization: Secondary | ICD-10-CM | POA: Diagnosis not present

## 2014-02-18 DIAGNOSIS — J302 Other seasonal allergic rhinitis: Secondary | ICD-10-CM | POA: Diagnosis not present

## 2014-02-18 DIAGNOSIS — G8929 Other chronic pain: Secondary | ICD-10-CM | POA: Diagnosis not present

## 2014-02-18 DIAGNOSIS — F312 Bipolar disorder, current episode manic severe with psychotic features: Secondary | ICD-10-CM | POA: Diagnosis not present

## 2014-02-18 DIAGNOSIS — M549 Dorsalgia, unspecified: Secondary | ICD-10-CM | POA: Diagnosis not present

## 2014-02-18 DIAGNOSIS — R51 Headache: Secondary | ICD-10-CM | POA: Diagnosis not present

## 2014-02-18 DIAGNOSIS — I10 Essential (primary) hypertension: Secondary | ICD-10-CM | POA: Diagnosis not present

## 2014-02-18 DIAGNOSIS — K219 Gastro-esophageal reflux disease without esophagitis: Secondary | ICD-10-CM | POA: Diagnosis not present

## 2014-03-11 ENCOUNTER — Emergency Department: Payer: Self-pay | Admitting: Emergency Medicine

## 2014-03-11 DIAGNOSIS — S39012A Strain of muscle, fascia and tendon of lower back, initial encounter: Secondary | ICD-10-CM | POA: Diagnosis not present

## 2014-03-11 DIAGNOSIS — I10 Essential (primary) hypertension: Secondary | ICD-10-CM | POA: Diagnosis not present

## 2014-03-11 DIAGNOSIS — Z720 Tobacco use: Secondary | ICD-10-CM | POA: Diagnosis not present

## 2014-03-11 DIAGNOSIS — Z79899 Other long term (current) drug therapy: Secondary | ICD-10-CM | POA: Diagnosis not present

## 2014-03-17 ENCOUNTER — Other Ambulatory Visit: Payer: Self-pay | Admitting: Physical Medicine & Rehabilitation

## 2014-03-17 ENCOUNTER — Encounter: Payer: Medicare Other | Attending: Physical Medicine & Rehabilitation

## 2014-03-17 ENCOUNTER — Encounter: Payer: Self-pay | Admitting: Physical Medicine & Rehabilitation

## 2014-03-17 ENCOUNTER — Ambulatory Visit (HOSPITAL_BASED_OUTPATIENT_CLINIC_OR_DEPARTMENT_OTHER): Payer: Medicare Other | Admitting: Physical Medicine & Rehabilitation

## 2014-03-17 ENCOUNTER — Ambulatory Visit (HOSPITAL_COMMUNITY)
Admission: RE | Admit: 2014-03-17 | Discharge: 2014-03-17 | Disposition: A | Discharge status: Home / Self Care | Payer: Medicare Other | Source: Ambulatory Visit | Attending: Physical Medicine & Rehabilitation | Admitting: Physical Medicine & Rehabilitation

## 2014-03-17 VITALS — BP 172/110 | HR 70 | Resp 14 | Ht 74.0 in | Wt 251.0 lb

## 2014-03-17 DIAGNOSIS — M5489 Other dorsalgia: Secondary | ICD-10-CM | POA: Diagnosis not present

## 2014-03-17 DIAGNOSIS — Z79899 Other long term (current) drug therapy: Secondary | ICD-10-CM

## 2014-03-17 DIAGNOSIS — S22000S Wedge compression fracture of unspecified thoracic vertebra, sequela: Secondary | ICD-10-CM

## 2014-03-17 DIAGNOSIS — M546 Pain in thoracic spine: Secondary | ICD-10-CM

## 2014-03-17 DIAGNOSIS — K219 Gastro-esophageal reflux disease without esophagitis: Secondary | ICD-10-CM | POA: Insufficient documentation

## 2014-03-17 DIAGNOSIS — G894 Chronic pain syndrome: Secondary | ICD-10-CM | POA: Diagnosis not present

## 2014-03-17 DIAGNOSIS — I10 Essential (primary) hypertension: Secondary | ICD-10-CM | POA: Insufficient documentation

## 2014-03-17 DIAGNOSIS — M549 Dorsalgia, unspecified: Secondary | ICD-10-CM

## 2014-03-17 DIAGNOSIS — Z5181 Encounter for therapeutic drug level monitoring: Secondary | ICD-10-CM

## 2014-03-17 DIAGNOSIS — M545 Low back pain, unspecified: Secondary | ICD-10-CM

## 2014-03-17 DIAGNOSIS — G8929 Other chronic pain: Secondary | ICD-10-CM | POA: Diagnosis not present

## 2014-03-17 DIAGNOSIS — F17210 Nicotine dependence, cigarettes, uncomplicated: Secondary | ICD-10-CM | POA: Diagnosis not present

## 2014-03-17 MED ORDER — ACETAMINOPHEN-CODEINE #3 300-30 MG PO TABS: 1.0000 | ORAL_TABLET | Freq: Three times a day (TID) | ORAL | Status: DC | PRN

## 2014-03-17 NOTE — Patient Instructions (Signed)
You may go across the street to [facility] to their outpatient radiology department for your back x-rays. I will review these and let you know if there is anything we need to follow-up on.  No news is good news

## 2014-03-17 NOTE — Progress Notes (Signed)
Subjective:    Patient ID: Christian Sanders, male    DOB: 04/20/1960, 53 y.o.   MRN: [id number]  HPI History of chronic thoracic pain secondary to prior history of compression fracture with angulation. Now complaining more of low back pain. Went to the emergency department and was prescribed 20 tablets of hydrocodone. We discussed that in the future if there is a back issue he should call this office first.He has a controlled substance agreement. Violation may result in discharge Overall getting decent relief from Tylenol No. 3 although the low back has been more problematic Lately.He states that he was helping a family member lift something when he had onset of low back pain. No pain shooting down the legs.  Pain Inventory Average Pain 8 Pain Right Now 8 My pain is constant, sharp and burning  In the last 24 hours, has pain interfered with the following? General activity 5 Relation with others 8 Enjoyment of life 8 What TIME of day is your pain at its worst? daytime Sleep (in general) Poor  Pain is worse with: walking, bending, standing and some activites Pain improves with: medication Relief from Meds: 3  Mobility walk without assistance how many minutes can you walk? 15 ability to climb steps?  yes do you drive?  yes  Function disabled: date disabled 2010  Neuro/Psych depression anxiety  Prior Studies Any changes since last visit?  no  Physicians involved in your care Any changes since last visit?  no   History reviewed. No pertinent family history. History   Social History  . Marital Status: Married    Spouse Name: N/A    Number of Children: N/A  . Years of Education: N/A   Social History Main Topics  . Smoking status: Current Every Day Smoker -- 0.50 packs/day for 30 years    Types: Cigarettes  . Smokeless tobacco: Never Used  . Alcohol Use: None  . Drug Use: None  . Sexual Activity: None   Other Topics Concern  . None   Social History Narrative    Past Surgical History  Procedure Laterality Date  . Facial reconstruction surgery      eye socket with 2 metal plates   Past Medical History  Diagnosis Date  . Hypertension   . GERD (gastroesophageal reflux disease)   . Depression   . Bipolar disorder    BP 172/110 mmHg  Pulse 70  Resp 14  Ht 6\' 2"  (1.88 m)  Wt 251 lb (113.853 kg)  BMI 32.21 kg/m2  SpO2 98%  Opioid Risk Score:   Fall Risk Score: High Fall Risk (>13 points)  Review of Systems  Constitutional: Negative.   HENT: Negative.   Eyes: Negative.   Respiratory: Negative.   Cardiovascular: Negative.   Gastrointestinal: Negative.   Endocrine: Negative.   Genitourinary: Negative.   Musculoskeletal: Positive for myalgias and back pain.  Skin: Negative.   Allergic/Immunologic: Negative.   Neurological: Negative.   Hematological: Negative.   Psychiatric/Behavioral: Positive for dysphoric mood. The patient is nervous/anxious.        Objective:   Physical Exam  Constitutional: He is oriented to person, place, and time. He appears well-developed and well-nourished.  Eyes: Conjunctivae are normal. Pupils are equal, round, and reactive to light.  Neurological: He is alert and oriented to person, place, and time.  Psychiatric: He has a normal mood and affect.  Nursing note and vitals reviewed.   Mild tenderness palpation in the thoracic paraspinal muscles starting around T4-T8  Moderate tenderness palpation in the lumbar paraspinal muscles starting L3 through S1  Negative straight leg raising test Normal strength in lower extremities Normal sensation in the lower extremities Normal pulses in the lower extremities  Skin shows evidence of eczema      Assessment & Plan:  1. History of closed compression of thoracic vertebraeWith chronic mid back pain. Getting good relief with Tylenol 3 one by mouth 3 times a day  2. Low back pain this is more acute. Has been over the last couple weeks. Received  hydrocodone at an urgent care. I do not see any evidence that there were films done. He has continued pain. Given his history of thoracic compression will check x-ray for lumbar compression fracture Discussed with patient agrees to plan. No evidence of radicular discomfort., There was no major trauma just a lifting injury.  As the patient was exiting the office, his wife took a poinsettia from the front desk  Will need to follow up with PCP regarding blood pressure. We did review that he is on 2 blood pressure medications and he states he's taking them

## 2014-03-18 LAB — PMP ALCOHOL METABOLITE (ETG): Ethyl Glucuronide (EtG): NEGATIVE ng/mL

## 2014-03-22 LAB — OPIATES/OPIOIDS (LC/MS-MS)
CODEINE URINE: NEGATIVE ng/mL — AB (ref <50)
HYDROCODONE: 59 ng/mL — AB (ref <50)
Hydromorphone: NEGATIVE ng/mL (ref <50)
Morphine Urine: NEGATIVE ng/mL — AB (ref <50)
NORHYDROCODONE, UR: NEGATIVE ng/mL (ref <50)
Noroxycodone, Ur: NEGATIVE ng/mL (ref <50)
Oxycodone, ur: NEGATIVE ng/mL (ref <50)
Oxymorphone: NEGATIVE ng/mL (ref <50)

## 2014-03-22 LAB — BENZODIAZEPINES (GC/LC/MS), URINE
Alprazolam metabolite (GC/LC/MS), ur confirm: NEGATIVE ng/mL (ref <25)
Clonazepam metabolite (GC/LC/MS), ur confirm: NEGATIVE ng/mL (ref <25)
FLURAZEPAMU: NEGATIVE ng/mL (ref <50)
Lorazepam (GC/LC/MS), ur confirm: NEGATIVE ng/mL (ref <50)
MIDAZOLAMU: NEGATIVE ng/mL (ref <50)
NORDIAZEPAMU: NEGATIVE ng/mL (ref <50)
OXAZEPAMU: NEGATIVE ng/mL (ref <50)
Temazepam (GC/LC/MS), ur confirm: NEGATIVE ng/mL (ref <50)
Triazolam metabolite (GC/LC/MS), ur confirm: NEGATIVE ng/mL (ref <50)

## 2014-03-24 LAB — PRESCRIPTION MONITORING PROFILE (SOLSTAS)
Amphetamine/Meth: NEGATIVE ng/mL
BUPRENORPHINE, URINE: NEGATIVE ng/mL
Barbiturate Screen, Urine: NEGATIVE ng/mL
CARISOPRODOL, URINE: NEGATIVE ng/mL
Cannabinoid Scrn, Ur: NEGATIVE ng/mL
Cocaine Metabolites: NEGATIVE ng/mL
Creatinine, Urine: 30.84 mg/dL (ref >20.0)
FENTANYL URINE: NEGATIVE ng/mL
MDMA URINE: NEGATIVE ng/mL
Meperidine, Ur: NEGATIVE ng/mL
Methadone Screen, Urine: NEGATIVE ng/mL
NITRITES URINE, INITIAL: NEGATIVE ug/mL
Oxycodone Screen, Ur: NEGATIVE ng/mL
PROPOXYPHENE: NEGATIVE ng/mL
Tapentadol, urine: NEGATIVE ng/mL
Tramadol Scrn, Ur: NEGATIVE ng/mL
Zolpidem, Urine: NEGATIVE ng/mL
pH, Initial: 5.5 pH (ref 4.5–8.9)

## 2014-03-30 ENCOUNTER — Telehealth: Payer: Self-pay | Admitting: *Deleted

## 2014-03-30 NOTE — Telephone Encounter (Signed)
Pt had x-rays, calling for results, I called pt back informing him that x-rays would be reviewed when Dr. Wynn BankerKirsteins gets back into town

## 2014-04-08 NOTE — Progress Notes (Signed)
Urine drug screen for this encounter is consistent for metabolites of prescribed medication. Med was taken one day before test.

## 2014-04-16 ENCOUNTER — Telehealth: Payer: Self-pay | Admitting: *Deleted

## 2014-04-16 NOTE — Telephone Encounter (Signed)
See Dr Wynn BankerKirsteins message.  We need to schedule him an appt with Kirsteins as soon as available

## 2014-04-16 NOTE — Telephone Encounter (Signed)
-----   Message from Erick ColaceAndrew E Kirsteins, MD sent at 04/11/2014 10:49 AM EST ----- Lets get pt in to see me this week, may need MRI if no better

## 2014-04-17 NOTE — Telephone Encounter (Signed)
FYI 2nd attempt to reach patient.  No voicemail and phone rings and then disconnects.

## 2014-04-17 NOTE — Telephone Encounter (Signed)
I have tried calling patient to set up an appointment, but there is no voicemail, phone just hangs up after a few rings.

## 2014-04-21 DIAGNOSIS — K089 Disorder of teeth and supporting structures, unspecified: Secondary | ICD-10-CM | POA: Diagnosis not present

## 2014-04-21 DIAGNOSIS — K088 Other specified disorders of teeth and supporting structures: Secondary | ICD-10-CM | POA: Diagnosis not present

## 2014-04-21 DIAGNOSIS — Z720 Tobacco use: Secondary | ICD-10-CM | POA: Diagnosis not present

## 2014-04-21 DIAGNOSIS — F17210 Nicotine dependence, cigarettes, uncomplicated: Secondary | ICD-10-CM | POA: Diagnosis not present

## 2014-05-22 DIAGNOSIS — M549 Dorsalgia, unspecified: Secondary | ICD-10-CM | POA: Diagnosis not present

## 2014-05-22 DIAGNOSIS — J302 Other seasonal allergic rhinitis: Secondary | ICD-10-CM | POA: Diagnosis not present

## 2014-05-22 DIAGNOSIS — K219 Gastro-esophageal reflux disease without esophagitis: Secondary | ICD-10-CM | POA: Diagnosis not present

## 2014-05-22 DIAGNOSIS — L301 Dyshidrosis [pompholyx]: Secondary | ICD-10-CM | POA: Diagnosis not present

## 2014-05-22 DIAGNOSIS — I10 Essential (primary) hypertension: Secondary | ICD-10-CM | POA: Diagnosis not present

## 2014-05-22 DIAGNOSIS — Z1389 Encounter for screening for other disorder: Secondary | ICD-10-CM | POA: Diagnosis not present

## 2014-05-22 DIAGNOSIS — F312 Bipolar disorder, current episode manic severe with psychotic features: Secondary | ICD-10-CM | POA: Diagnosis not present

## 2014-05-22 DIAGNOSIS — G8929 Other chronic pain: Secondary | ICD-10-CM | POA: Diagnosis not present

## 2014-06-16 ENCOUNTER — Encounter: Payer: Medicare Other | Attending: Physical Medicine & Rehabilitation

## 2014-06-16 ENCOUNTER — Ambulatory Visit: Payer: Medicare Other | Admitting: Physical Medicine & Rehabilitation

## 2014-06-16 DIAGNOSIS — M549 Dorsalgia, unspecified: Secondary | ICD-10-CM | POA: Insufficient documentation

## 2014-06-16 DIAGNOSIS — F17210 Nicotine dependence, cigarettes, uncomplicated: Secondary | ICD-10-CM | POA: Insufficient documentation

## 2014-06-16 DIAGNOSIS — G8929 Other chronic pain: Secondary | ICD-10-CM | POA: Insufficient documentation

## 2014-06-16 DIAGNOSIS — I10 Essential (primary) hypertension: Secondary | ICD-10-CM | POA: Insufficient documentation

## 2014-06-16 DIAGNOSIS — K219 Gastro-esophageal reflux disease without esophagitis: Secondary | ICD-10-CM | POA: Insufficient documentation

## 2014-06-22 DIAGNOSIS — R51 Headache: Secondary | ICD-10-CM | POA: Diagnosis not present

## 2014-06-22 DIAGNOSIS — I10 Essential (primary) hypertension: Secondary | ICD-10-CM | POA: Diagnosis not present

## 2014-06-22 DIAGNOSIS — M549 Dorsalgia, unspecified: Secondary | ICD-10-CM | POA: Diagnosis not present

## 2014-06-22 DIAGNOSIS — F312 Bipolar disorder, current episode manic severe with psychotic features: Secondary | ICD-10-CM | POA: Diagnosis not present

## 2014-06-22 DIAGNOSIS — K219 Gastro-esophageal reflux disease without esophagitis: Secondary | ICD-10-CM | POA: Diagnosis not present

## 2014-06-22 DIAGNOSIS — G8929 Other chronic pain: Secondary | ICD-10-CM | POA: Diagnosis not present

## 2014-06-22 DIAGNOSIS — R000 Tachycardia, unspecified: Secondary | ICD-10-CM | POA: Diagnosis not present

## 2014-07-13 DIAGNOSIS — I10 Essential (primary) hypertension: Secondary | ICD-10-CM | POA: Diagnosis not present

## 2014-07-13 DIAGNOSIS — R000 Tachycardia, unspecified: Secondary | ICD-10-CM | POA: Diagnosis not present

## 2014-07-13 DIAGNOSIS — F312 Bipolar disorder, current episode manic severe with psychotic features: Secondary | ICD-10-CM | POA: Diagnosis not present

## 2014-07-13 DIAGNOSIS — K219 Gastro-esophageal reflux disease without esophagitis: Secondary | ICD-10-CM | POA: Diagnosis not present

## 2014-07-13 DIAGNOSIS — Z720 Tobacco use: Secondary | ICD-10-CM | POA: Diagnosis not present

## 2014-08-07 DIAGNOSIS — I10 Essential (primary) hypertension: Secondary | ICD-10-CM | POA: Diagnosis not present

## 2014-08-07 DIAGNOSIS — L732 Hidradenitis suppurativa: Secondary | ICD-10-CM | POA: Diagnosis not present

## 2014-08-17 DIAGNOSIS — B078 Other viral warts: Secondary | ICD-10-CM | POA: Diagnosis not present

## 2014-08-17 DIAGNOSIS — R51 Headache: Secondary | ICD-10-CM | POA: Diagnosis not present

## 2014-08-17 DIAGNOSIS — R000 Tachycardia, unspecified: Secondary | ICD-10-CM | POA: Diagnosis not present

## 2014-08-17 DIAGNOSIS — I10 Essential (primary) hypertension: Secondary | ICD-10-CM | POA: Diagnosis not present

## 2014-08-17 DIAGNOSIS — J302 Other seasonal allergic rhinitis: Secondary | ICD-10-CM | POA: Diagnosis not present

## 2014-08-17 DIAGNOSIS — K219 Gastro-esophageal reflux disease without esophagitis: Secondary | ICD-10-CM | POA: Diagnosis not present

## 2014-08-17 DIAGNOSIS — M549 Dorsalgia, unspecified: Secondary | ICD-10-CM | POA: Diagnosis not present

## 2014-08-17 DIAGNOSIS — F312 Bipolar disorder, current episode manic severe with psychotic features: Secondary | ICD-10-CM | POA: Diagnosis not present

## 2014-09-07 ENCOUNTER — Other Ambulatory Visit: Payer: Self-pay | Admitting: Family Medicine

## 2014-09-07 MED ORDER — TRIAMCINOLONE ACETONIDE 0.1 % EX CREA: 1.0000 "application " | TOPICAL_CREAM | Freq: Two times a day (BID) | CUTANEOUS | Status: DC

## 2014-09-23 ENCOUNTER — Telehealth: Payer: Self-pay

## 2014-09-23 ENCOUNTER — Other Ambulatory Visit: Payer: Self-pay

## 2014-09-23 NOTE — Telephone Encounter (Signed)
Pt scheduled for a colonoscopy on 10-09-14 at [facility]. Instructs/rx mailed.

## 2014-09-23 NOTE — Telephone Encounter (Signed)
-----   Message from Samar R Ellison sent at 09/07/2014 10:26 AM EDT ----- Regarding: Patient requesting call from nursing services From: Edmonds, Michelle Sent: 08/25/2014  Office Visit: 11/20/2012  [phone] Colon traige 

## 2014-09-23 NOTE — Telephone Encounter (Signed)
Gastroenterology Pre-Procedure Review  Request Date: 10-09-14 Requesting Physician: Dr. Venora Maples  PATIENT REVIEW QUESTIONS: The patient responded to the following health history questions as indicated:    1. Are you having any GI issues? yes (constipation) 2. Do you have a personal history of Polyps? no 3. Do you have a family history of Colon Cancer or Polyps? no 4. Diabetes Mellitus? no 5. Joint replacements in the past 12 months?no 6. Major health problems in the past 3 months?no 7. Any artificial heart valves, MVP, or defibrillator?no    MEDICATIONS & ALLERGIES:    Patient reports the following regarding taking any anticoagulation/antiplatelet therapy:   Plavix, Coumadin, Eliquis, Xarelto, Lovenox, Pradaxa, Brilinta, or Effient? no Aspirin? no  Patient confirms/reports the following medications:  Current Outpatient Prescriptions  Medication Sig Dispense Refill  . hydrochlorothiazide (MICROZIDE) 12.5 MG capsule Take 12.5 mg by mouth daily.    Marland Kitchen lisinopril (PRINIVIL,ZESTRIL) 40 MG tablet Take 40 mg by mouth daily.    Marland Kitchen omeprazole (PRILOSEC) 20 MG capsule Take 20 mg by mouth daily.    Marland Kitchen acetaminophen-codeine (TYLENOL #3) 300-30 MG per tablet Take 1 tablet by mouth every 8 (eight) hours as needed for moderate pain. (Patient not taking: Reported on 09/23/2014) 90 tablet 2  . ALPRAZolam (XANAX) 0.5 MG tablet     . amoxicillin-clavulanate (AUGMENTIN) 875-125 MG per tablet     . baclofen (LIORESAL) 20 MG tablet Take 20 mg by mouth 3 (three) times daily.    Marland Kitchen escitalopram (LEXAPRO) 10 MG tablet Take 10 mg by mouth daily.    Marland Kitchen etodolac (LODINE) 500 MG tablet     . QUEtiapine (SEROQUEL) 50 MG tablet Take 150 mg by mouth at bedtime.    . triamcinolone cream (KENALOG) 0.1 % Apply 1 application topically 2 (two) times daily. (Patient not taking: Reported on 09/23/2014) 28.4 g 1   No current facility-administered medications for this visit.    Patient confirms/reports the following  allergies:  No Known Allergies  No orders of the defined types were placed in this encounter.    AUTHORIZATION INFORMATION Primary Insurance: 1D#: Group #:  Secondary Insurance: 1D#: Group #:  SCHEDULE INFORMATION: Date: 10-09-14 Time: Location: [facility]

## 2014-09-23 NOTE — Telephone Encounter (Signed)
-----   Message from Myrtis Hopping sent at 09/07/2014 10:26 AM EDT ----- Regarding: Patient requesting call from nursing services From: Suella Grove Sent: 08/25/2014  Office Visit: 11/20/2012  [phone] Colon traige

## 2014-10-06 ENCOUNTER — Encounter: Payer: Self-pay | Admitting: *Deleted

## 2014-10-08 NOTE — Discharge Instructions (Signed)

## 2014-10-09 ENCOUNTER — Ambulatory Visit: Payer: Medicare Other | Admitting: Anesthesiology

## 2014-10-09 ENCOUNTER — Ambulatory Visit
Admission: RE | Admit: 2014-10-09 | Discharge: 2014-10-09 | Disposition: A | Discharge status: Home / Self Care | Payer: Medicare Other | Source: Ambulatory Visit | Attending: Gastroenterology | Admitting: Gastroenterology

## 2014-10-09 ENCOUNTER — Encounter
Admission: RE | Disposition: A | Discharge status: Home / Self Care | Payer: Self-pay | Source: Ambulatory Visit | Attending: Gastroenterology

## 2014-10-09 ENCOUNTER — Encounter: Payer: Self-pay | Admitting: *Deleted

## 2014-10-09 DIAGNOSIS — F419 Anxiety disorder, unspecified: Secondary | ICD-10-CM | POA: Diagnosis not present

## 2014-10-09 DIAGNOSIS — F418 Other specified anxiety disorders: Secondary | ICD-10-CM | POA: Insufficient documentation

## 2014-10-09 DIAGNOSIS — F17210 Nicotine dependence, cigarettes, uncomplicated: Secondary | ICD-10-CM | POA: Diagnosis not present

## 2014-10-09 DIAGNOSIS — Z79899 Other long term (current) drug therapy: Secondary | ICD-10-CM | POA: Insufficient documentation

## 2014-10-09 DIAGNOSIS — R51 Headache: Secondary | ICD-10-CM | POA: Insufficient documentation

## 2014-10-09 DIAGNOSIS — K219 Gastro-esophageal reflux disease without esophagitis: Secondary | ICD-10-CM | POA: Diagnosis not present

## 2014-10-09 DIAGNOSIS — F4310 Post-traumatic stress disorder, unspecified: Secondary | ICD-10-CM | POA: Insufficient documentation

## 2014-10-09 DIAGNOSIS — Z7982 Long term (current) use of aspirin: Secondary | ICD-10-CM | POA: Insufficient documentation

## 2014-10-09 DIAGNOSIS — Z1211 Encounter for screening for malignant neoplasm of colon: Secondary | ICD-10-CM | POA: Diagnosis not present

## 2014-10-09 DIAGNOSIS — K640 First degree hemorrhoids: Secondary | ICD-10-CM | POA: Diagnosis not present

## 2014-10-09 DIAGNOSIS — I10 Essential (primary) hypertension: Secondary | ICD-10-CM | POA: Insufficient documentation

## 2014-10-09 DIAGNOSIS — F319 Bipolar disorder, unspecified: Secondary | ICD-10-CM | POA: Insufficient documentation

## 2014-10-09 DIAGNOSIS — K5730 Diverticulosis of large intestine without perforation or abscess without bleeding: Secondary | ICD-10-CM | POA: Insufficient documentation

## 2014-10-09 HISTORY — DX: Post-traumatic stress disorder, unspecified: F43.10

## 2014-10-09 HISTORY — DX: Other specified disorders of teeth and supporting structures: Z97.2

## 2014-10-09 HISTORY — DX: Headache, unspecified: R51.9

## 2014-10-09 HISTORY — DX: Other specified disorders of teeth and supporting structures: K08.89

## 2014-10-09 HISTORY — DX: Headache: R51

## 2014-10-09 HISTORY — DX: Anxiety disorder, unspecified: F41.9

## 2014-10-09 HISTORY — DX: Dorsopathy, unspecified: M53.9

## 2014-10-09 HISTORY — DX: Spondylopathy, unspecified: M48.9

## 2014-10-09 HISTORY — PX: COLONOSCOPY WITH PROPOFOL: SHX5780

## 2014-10-09 SURGERY — COLONOSCOPY WITH PROPOFOL
Anesthesia: Monitor Anesthesia Care | Wound class: Contaminated

## 2014-10-09 MED ORDER — PROPOFOL 10 MG/ML IV BOLUS
INTRAVENOUS | Status: DC | PRN
  Administered 2014-10-09: 50 mg via INTRAVENOUS
  Administered 2014-10-09: 30 mg via INTRAVENOUS
  Administered 2014-10-09: 70 mg via INTRAVENOUS
  Administered 2014-10-09 (×4): 30 mg via INTRAVENOUS

## 2014-10-09 MED ORDER — LIDOCAINE HCL (CARDIAC) 20 MG/ML IV SOLN
INTRAVENOUS | Status: DC | PRN
  Administered 2014-10-09: 50 mg via INTRAVENOUS

## 2014-10-09 MED ORDER — ACETAMINOPHEN 325 MG PO TABS: 325.0000 mg | ORAL_TABLET | ORAL | Status: DC | PRN

## 2014-10-09 MED ORDER — STERILE WATER FOR IRRIGATION IR SOLN
Status: DC | PRN
  Administered 2014-10-09: 11:00:00

## 2014-10-09 MED ORDER — LABETALOL HCL 5 MG/ML IV SOLN
INTRAVENOUS | Status: DC | PRN
  Administered 2014-10-09: 5 mg via INTRAVENOUS
  Administered 2014-10-09: 10 mg via INTRAVENOUS
  Administered 2014-10-09: 5 mg via INTRAVENOUS

## 2014-10-09 MED ORDER — ACETAMINOPHEN 160 MG/5ML PO SOLN: 325.0000 mg | ORAL | Status: DC | PRN

## 2014-10-09 MED ORDER — LACTATED RINGERS IV SOLN
INTRAVENOUS | Status: DC
  Administered 2014-10-09: 10:00:00 via INTRAVENOUS

## 2014-10-09 SURGICAL SUPPLY — 28 items

## 2014-10-09 NOTE — Anesthesia Procedure Notes (Signed)
Procedure Name: MAC Performed by: Donna Silverman Pre-anesthesia Checklist: Patient identified, Emergency Drugs available, Suction available, Timeout performed and Patient being monitored Patient Re-evaluated:Patient Re-evaluated prior to inductionOxygen Delivery Method: Nasal cannula Placement Confirmation: positive ETCO2       

## 2014-10-09 NOTE — Transfer of Care (Signed)
Immediate Anesthesia Transfer of Care Note  Patient: Christian Sanders  Procedure(s) Performed: Procedure(s): COLONOSCOPY WITH PROPOFOL (N/A)  Patient Location: PACU  Anesthesia Type: MAC  Level of Consciousness: awake, alert  and patient cooperative  Airway and Oxygen Therapy: Patient Spontanous Breathing and Patient connected to supplemental oxygen  Post-op Assessment: Post-op Vital signs reviewed, Patient's Cardiovascular Status Stable, Respiratory Function Stable, Patent Airway and No signs of Nausea or vomiting  Post-op Vital Signs: Reviewed and stable  Complications: No apparent anesthesia complications

## 2014-10-09 NOTE — Anesthesia Preprocedure Evaluation (Addendum)
Anesthesia Evaluation  Patient identified by MRN, date of birth, ID band Patient awake    Reviewed: Allergy & Precautions, NPO status , reviewed documented beta blocker date and time   Airway Mallampati: II  TM Distance: >3 FB Neck ROM: full    Dental no notable dental hx.    Pulmonary Current Smoker,    Pulmonary exam normal       Cardiovascular hypertension, On Medications and On Home Beta Blockers Normal cardiovascular exam    Neuro/Psych  Headaches, PSYCHIATRIC DISORDERS Anxiety Depression Bipolar Disorder    GI/Hepatic Neg liver ROS, GERD-  ,  Endo/Other  negative endocrine ROS  Renal/GU negative Renal ROS  negative genitourinary   Musculoskeletal   Abdominal   Peds negative pediatric ROS (+)  Hematology negative hematology ROS (+)   Anesthesia Other Findings Has not taken lisinopril for 2 days.  BP up this AM  Reproductive/Obstetrics                           Anesthesia Physical Anesthesia Plan  ASA: II  Anesthesia Plan: MAC   Post-op Pain Management:    Induction: Intravenous  Airway Management Planned: Nasal Cannula  Additional Equipment:   Intra-op Plan:   Post-operative Plan:   Informed Consent: I have reviewed the patients History and Physical, chart, labs and discussed the procedure including the risks, benefits and alternatives for the proposed anesthesia with the patient or authorized representative who has indicated his/her understanding and acceptance.     Plan Discussed with: CRNA  Anesthesia Plan Comments: (Labetalol IV intraop as needed for BP control.)        Anesthesia Quick Evaluation

## 2014-10-09 NOTE — H&P (Signed)
[facility]  [address] [place], [place] [address] Phone: [phone] Fax : [phone]  Primary Care Physician:  Fidel LevyJames Hawkins Jr, MD Primary Gastroenterologist:  Dr. Servando SnareWohl  Pre-Procedure History & Physical: HPI:  Christian Sanders is a 54 y.o. male is here for a screening colonoscopy.   Past Medical History  Diagnosis Date  . Hypertension   . GERD (gastroesophageal reflux disease)   . Depression   . Bipolar disorder   . Anxiety   . Dentures complicating chewing     full upper, partial lower  . Headache     s/p facial injury and reconstruction  . Vertebral column disorder     2 crushed thoracic vertebra  . PTSD (post-traumatic stress disorder)     Past Surgical History  Procedure Laterality Date  . Facial reconstruction surgery      eye socket with 2 metal plates    Prior to Admission medications   Medication Sig Start Date End Date Taking? Authorizing Provider  Aspirin-Acetaminophen-Caffeine (GOODY HEADACHE PO) Take by mouth daily as needed.   Yes Historical Provider, MD  metoprolol tartrate (LOPRESSOR) 25 MG tablet Take 25 mg by mouth 2 (two) times daily.   Yes Historical Provider, MD  omeprazole (PRILOSEC) 20 MG capsule Take 20 mg by mouth daily. PM   Yes Historical Provider, MD  acetaminophen-codeine (TYLENOL #3) 300-30 MG per tablet Take 1 tablet by mouth every 8 (eight) hours as needed for moderate pain. Patient not taking: Reported on 09/23/2014 03/17/14   Erick ColaceAndrew E Kirsteins, MD  ALPRAZolam Prudy Feeler(XANAX) 0.5 MG tablet  11/27/13   Historical Provider, MD  amoxicillin-clavulanate (AUGMENTIN) 875-125 MG per tablet  12/12/13   Historical Provider, MD  aspirin 120 MG suppository Place 120 mg rectally every 6 (six) hours as needed for fever.    Historical Provider, MD  baclofen (LIORESAL) 20 MG tablet Take 20 mg by mouth 3 (three) times daily.    Historical Provider, MD  escitalopram (LEXAPRO) 10 MG tablet Take 10 mg by mouth daily.    Historical Provider,  MD  etodolac (LODINE) 500 MG tablet  12/12/13   Historical Provider, MD  hydrochlorothiazide (MICROZIDE) 12.5 MG capsule Take 12.5 mg by mouth daily.    Historical Provider, MD  lisinopril (PRINIVIL,ZESTRIL) 40 MG tablet Take 20 mg by mouth daily. AM    Historical Provider, MD  QUEtiapine (SEROQUEL) 50 MG tablet Take 150 mg by mouth at bedtime.    Historical Provider, MD  triamcinolone cream (KENALOG) 0.1 % Apply 1 application topically 2 (two) times daily. Patient not taking: Reported on 09/23/2014 09/07/14   Janeann ForehandJames H Hawkins Jr., MD    Allergies as of 09/23/2014  . (No Known Allergies)    History reviewed. No pertinent family history.  History   Social History  . Marital Status: Married    Spouse Name: N/A  . Number of Children: N/A  . Years of Education: N/A   Occupational History  . Not on file.   Social History Main Topics  . Smoking status: Current Every Day Smoker -- 0.50 packs/day for 30 years    Types: Cigarettes  . Smokeless tobacco: Never Used  . Alcohol Use: Yes     Comment: 3 or 4 drinks, once per month  . Drug Use: Not on file  . Sexual Activity: Not on file   Other Topics Concern  . Not on file   Social History Narrative    Review of Systems: See HPI, otherwise negative ROS  Physical Exam: BP 154/107 mmHg  Pulse 70  Temp(Src) 97.7 F (36.5 C)  Resp 18  Ht  (1.88 m)  Wt 237 lb (107.502 kg)  BMI 30.42 kg/m2  SpO2 94% General:   Alert,  pleasant and cooperative in NAD Head:  Normocephalic and atraumatic. Neck:  Supple; no masses or thyromegaly. Lungs:  Clear throughout to auscultation.    Heart:  Regular rate and rhythm. Abdomen:  Soft, nontender and nondistended. Normal bowel sounds, without guarding, and without rebound.   Neurologic:  Alert and  oriented x4;  grossly normal neurologically.  Impression/Plan: [name] is now here to undergo a screening colonoscopy.  Risks, benefits, and alternatives regarding colonoscopy have been  reviewed with the patient.  Questions have been answered.  All parties agreeable.

## 2014-10-09 NOTE — Op Note (Signed)
[facility] Gastroenterology Patient Name: Christian Sanders Procedure Date: 10/09/2014 11:09 AM MRN: [id number] Account #: [id number] Date of Birth: [birth year] Admit Type: Outpatient Age: 54 Room: [facility] OR ROOM 01 Gender: Male Note Status: Finalized Procedure:         Colonoscopy Indications:       Screening for colorectal malignant neoplasm Providers:         Midge Minium, MD Medicines:         Propofol per Anesthesia Complications:     No immediate complications. Procedure:         Pre-Anesthesia Assessment:                    - Prior to the procedure, a History and Physical was                     performed, and patient medications and allergies were                     reviewed. The patient's tolerance of previous anesthesia                     was also reviewed. The risks and benefits of the procedure                     and the sedation options and risks were discussed with the                     patient. All questions were answered, and informed consent                     was obtained. Prior Anticoagulants: The patient has taken                     no previous anticoagulant or antiplatelet agents. ASA                     Grade Assessment: II - A patient with mild systemic                     disease. After reviewing the risks and benefits, the                     patient was deemed in satisfactory condition to undergo                     the procedure.                    After obtaining informed consent, the colonoscope was                     passed under direct vision. Throughout the procedure, the                     patient's blood pressure, pulse, and oxygen saturations                     were monitored continuously. The Olympus CF H180AL                     colonoscope (S#: [id number]) was introduced through the anus                     and advanced to the the cecum, identified by  appendiceal                     orifice and ileocecal valve. The colonoscopy  was performed                     without difficulty. The patient tolerated the procedure                     well. The quality of the bowel preparation was excellent. Findings:      The perianal and digital rectal examinations were normal.      Many small-mouthed diverticula were found in the entire colon.      Non-bleeding internal hemorrhoids were found during retroflexion. The       hemorrhoids were Grade I (internal hemorrhoids that do not prolapse). Impression:        - Diverticulosis in the entire examined colon.                    - Non-bleeding internal hemorrhoids.                    - No specimens collected. Recommendation:    - Repeat colonoscopy in 10 years for screening unless any                     change in family history or lower GI problems. Procedure Code(s): --- Professional ---                    [phone], Colonoscopy, flexible; diagnostic, including                     collection of specimen(s) by brushing or washing, when                     performed (separate procedure) Diagnosis Code(s): --- Professional ---                    Z12.11, Encounter for screening for malignant neoplasm of                     colon CPT copyright 2014 American Medical Association. All rights reserved. The codes documented in this report are preliminary and upon coder review may  be revised to meet current compliance requirements. Midge Miniumarren Karandeep Resende, MD 10/09/2014 11:32:35 AM This report has been signed electronically. Number of Addenda: 0 Note Initiated On: 10/09/2014 11:09 AM Scope Withdrawal Time: 0 hours 6 minutes 2 seconds  Total Procedure Duration: 0 hours 10 minutes 43 seconds       [facility]

## 2014-10-09 NOTE — Anesthesia Postprocedure Evaluation (Signed)
  Anesthesia Post-op Note  Patient: Christian Sanders  Procedure(s) Performed: Procedure(s): COLONOSCOPY WITH PROPOFOL (N/A)  Anesthesia type:MAC  Patient location: PACU  Post pain: Pain level controlled  Post assessment: Post-op Vital signs reviewed, Patient's Cardiovascular Status Stable, Respiratory Function Stable, Patent Airway and No signs of Nausea or vomiting  Post vital signs: Reviewed and stable  Last Vitals:  Filed Vitals:   10/09/14 1137  BP: 128/82  Pulse: 73  Temp:   Resp: 16    Level of consciousness: awake, alert  and patient cooperative  Complications: No apparent anesthesia complications

## 2014-11-23 ENCOUNTER — Encounter: Payer: Self-pay | Admitting: Family Medicine

## 2014-11-23 ENCOUNTER — Ambulatory Visit (INDEPENDENT_AMBULATORY_CARE_PROVIDER_SITE_OTHER): Payer: Medicare Other | Admitting: Family Medicine

## 2014-11-23 VITALS — BP 160/110 | HR 67 | Temp 97.6°F | Resp 16 | Ht 72.0 in | Wt 235.0 lb

## 2014-11-23 DIAGNOSIS — F310 Bipolar disorder, current episode hypomanic: Secondary | ICD-10-CM | POA: Diagnosis not present

## 2014-11-23 DIAGNOSIS — I10 Essential (primary) hypertension: Secondary | ICD-10-CM | POA: Diagnosis not present

## 2014-11-23 DIAGNOSIS — F319 Bipolar disorder, unspecified: Secondary | ICD-10-CM | POA: Insufficient documentation

## 2014-11-23 DIAGNOSIS — F4310 Post-traumatic stress disorder, unspecified: Secondary | ICD-10-CM | POA: Diagnosis not present

## 2014-11-23 MED ORDER — LISINOPRIL 40 MG PO TABS: 40.0000 mg | ORAL_TABLET | Freq: Every day | ORAL | Status: DC

## 2014-11-23 MED ORDER — HYDROCHLOROTHIAZIDE 12.5 MG PO CAPS: 12.5000 mg | ORAL_CAPSULE | Freq: Every day | ORAL | Status: DC

## 2014-11-23 MED ORDER — METOPROLOL TARTRATE 25 MG PO TABS: 25.0000 mg | ORAL_TABLET | Freq: Two times a day (BID) | ORAL | Status: DC

## 2014-11-23 NOTE — Patient Instructions (Signed)
Make and keep Psychiatry appt.  Restart HCTZ and increase dose of Lisinopril back to 40 mg/d.

## 2014-11-23 NOTE — Progress Notes (Signed)
Name: Christian Sanders   MRN: [id number]    DOB: 1961/02/04   Date:11/23/2014       Progress Note  Subjective  Chief Complaint  Chief Complaint  Patient presents with  . Follow-up  . Hypertension    HPI  Here for f/u of HBP.  Not taking HCTZ (left it off by error).  Also not taking Seroquel from Psych sec. To lack of f/u there.     Past Medical History  Diagnosis Date  . Hypertension   . GERD (gastroesophageal reflux disease)   . Depression   . Bipolar disorder   . Anxiety   . Dentures complicating chewing     full upper, partial lower  . Headache     s/p facial injury and reconstruction  . Vertebral column disorder     2 crushed thoracic vertebra  . PTSD (post-traumatic stress disorder)     Social History  Substance Use Topics  . Smoking status: Current Every Day Smoker -- 0.50 packs/day for 30 years    Types: Cigarettes  . Smokeless tobacco: Never Used  . Alcohol Use: 0.0 oz/week    0 Standard drinks or equivalent per week     Comment: 3 or 4 drinks, once per month     Current outpatient prescriptions:  .  Aspirin-Acetaminophen-Caffeine (GOODY HEADACHE PO), Take by mouth daily as needed., Disp: , Rfl:  .  lisinopril (PRINIVIL,ZESTRIL) 40 MG tablet, Take 20 mg by mouth daily. AM, Disp: , Rfl:  .  metoprolol tartrate (LOPRESSOR) 25 MG tablet, Take 25 mg by mouth 2 (two) times daily., Disp: , Rfl:  .  triamcinolone cream (KENALOG) 0.1 %, Apply 1 application topically 2 (two) times daily., Disp: 28.4 g, Rfl: 1 .  baclofen (LIORESAL) 20 MG tablet, Take 20 mg by mouth 3 (three) times daily., Disp: , Rfl:  .  hydrochlorothiazide (MICROZIDE) 12.5 MG capsule, Take 12.5 mg by mouth daily., Disp: , Rfl:  .  QUEtiapine (SEROQUEL) 50 MG tablet, Take 150 mg by mouth at bedtime., Disp: , Rfl:   Allergies  Allergen Reactions  . Pineapple Nausea And Vomiting    Review of Systems  Constitutional: Negative for fever, chills and malaise/fatigue.  HENT: Negative for hearing loss.    Eyes: Negative for blurred vision and double vision.  Respiratory: Negative for cough, sputum production, shortness of breath and wheezing.   Cardiovascular: Negative for chest pain, palpitations, claudication and leg swelling.  Gastrointestinal: Negative for heartburn, nausea, vomiting and abdominal pain.  Genitourinary: Negative for dysuria, urgency and frequency.  Musculoskeletal: Negative for myalgias and joint pain.  Skin: Negative for rash.  Neurological: Positive for headaches. Negative for dizziness, tingling, sensory change, focal weakness and weakness.  Psychiatric/Behavioral: Positive for depression. The patient is nervous/anxious.       Objective  Filed Vitals:   11/23/14 1036  BP: 156/115  Pulse: 67  Temp: 97.6 F (36.4 C)  TempSrc: Oral  Resp: 16  Height: 6' (1.829 m)  Weight: 235 lb (106.595 kg)     Physical Exam  Constitutional: He is well-developed, well-nourished, and in no distress. No distress.  HENT:  Head: Normocephalic and atraumatic.  Eyes: Conjunctivae and EOM are normal. Pupils are equal, round, and reactive to light. No scleral icterus.  Neck: Normal range of motion. Neck supple. Carotid bruit is not present. No thyromegaly present.  Cardiovascular: Normal rate, regular rhythm, normal heart sounds and intact distal pulses.  Exam reveals no gallop and no friction rub.   No  murmur heard. Pulmonary/Chest: Effort normal and breath sounds normal. No respiratory distress. He has no wheezes. He has no rales.  Musculoskeletal: Normal range of motion. He exhibits no edema.  Lymphadenopathy:    He has no cervical adenopathy.  Psychiatric: Mood, memory, affect and judgment normal.  Vitals reviewed.     No results found for this or any previous visit (from the past 2160 hour(s)).   Assessment & Plan  1. Essential hypertension  - hydrochlorothiazide (MICROZIDE) 12.5 MG capsule; Take 1 capsule (12.5 mg total) by mouth daily.  Dispense: 30 capsule;  Refill: 12 - lisinopril (PRINIVIL,ZESTRIL) 40 MG tablet; Take 1 tablet (40 mg total) by mouth daily. AM  Dispense: 30 tablet; Refill: 12 - metoprolol tartrate (LOPRESSOR) 25 MG tablet; Take 1 tablet (25 mg total) by mouth 2 (two) times daily.  Dispense: 60 tablet; Refill: 12  2. Bipolar affective disorder, current episode hypomanic  -see Psych  3. PTSD (post-traumatic stress disorder)  -see Psych

## 2014-12-25 ENCOUNTER — Encounter: Payer: Self-pay | Admitting: Family Medicine

## 2014-12-25 ENCOUNTER — Ambulatory Visit (INDEPENDENT_AMBULATORY_CARE_PROVIDER_SITE_OTHER): Payer: Medicare Other | Admitting: Family Medicine

## 2014-12-25 VITALS — BP 145/100 | HR 58 | Temp 97.7°F | Resp 16 | Ht 74.0 in | Wt 236.6 lb

## 2014-12-25 DIAGNOSIS — Z23 Encounter for immunization: Secondary | ICD-10-CM

## 2014-12-25 DIAGNOSIS — I10 Essential (primary) hypertension: Secondary | ICD-10-CM | POA: Diagnosis not present

## 2014-12-25 MED ORDER — HYDROCHLOROTHIAZIDE 25 MG PO TABS: 25.0000 mg | ORAL_TABLET | Freq: Every day | ORAL | Status: DC

## 2014-12-25 NOTE — Patient Instructions (Signed)
Discussed again trying to stop smoking.

## 2014-12-25 NOTE — Progress Notes (Signed)
Name: Christian Sanders   MRN: [id number]    DOB: Apr 24, 1960   Date:12/25/2014       Progress Note  Subjective  Chief Complaint  Chief Complaint  Patient presents with  . Hypertension    follow up 1 month     HPI Here to f/u of HBP.  Taking meds.  C/o dizziness with higher dose of Lisinopril unless he eats before he takes it.  No problem-specific assessment & plan notes found for this encounter.   Past Medical History  Diagnosis Date  . Hypertension   . GERD (gastroesophageal reflux disease)   . Depression   . Bipolar disorder   . Anxiety   . Dentures complicating chewing     full upper, partial lower  . Headache     s/p facial injury and reconstruction  . Vertebral column disorder     2 crushed thoracic vertebra  . PTSD (post-traumatic stress disorder)     Social History  Substance Use Topics  . Smoking status: Current Every Day Smoker -- 0.50 packs/day for 30 years    Types: Cigarettes  . Smokeless tobacco: Never Used  . Alcohol Use: 0.0 oz/week    0 Standard drinks or equivalent per week     Comment: 3 or 4 drinks, once per month     Current outpatient prescriptions:  .  Aspirin-Acetaminophen-Caffeine (GOODY HEADACHE PO), Take by mouth daily as needed., Disp: , Rfl:  .  baclofen (LIORESAL) 20 MG tablet, Take 20 mg by mouth 3 (three) times daily., Disp: , Rfl:  .  hydrochlorothiazide (MICROZIDE) 12.5 MG capsule, Take 1 capsule (12.5 mg total) by mouth daily., Disp: 30 capsule, Rfl: 12 .  lisinopril (PRINIVIL,ZESTRIL) 40 MG tablet, Take 1 tablet (40 mg total) by mouth daily. AM, Disp: 30 tablet, Rfl: 12 .  metoprolol tartrate (LOPRESSOR) 25 MG tablet, Take 1 tablet (25 mg total) by mouth 2 (two) times daily., Disp: 60 tablet, Rfl: 12 .  QUEtiapine (SEROQUEL) 50 MG tablet, Take 150 mg by mouth at bedtime., Disp: , Rfl:  .  triamcinolone cream (KENALOG) 0.1 %, Apply 1 application topically 2 (two) times daily., Disp: 28.4 g, Rfl: 1  Allergies  Allergen Reactions  .  Pineapple Nausea And Vomiting    Review of Systems  Constitutional: Negative for fever, chills, weight loss and malaise/fatigue.  HENT: Negative for hearing loss.   Eyes: Negative for blurred vision and double vision.  Respiratory: Negative for cough, sputum production, shortness of breath and wheezing.   Cardiovascular: Negative for chest pain, palpitations, orthopnea and leg swelling.  Gastrointestinal: Positive for heartburn (chronic.). Negative for abdominal pain and blood in stool.  Genitourinary: Negative for dysuria, urgency and frequency.  Musculoskeletal: Negative for myalgias and joint pain.  Skin: Negative for rash.  Neurological: Positive for headaches (chronic). Negative for dizziness, sensory change, focal weakness and weakness.      Objective  Filed Vitals:   12/25/14 0929  BP: 148/102  Pulse: 58  Temp: 97.7 F (36.5 C)  TempSrc: Oral  Resp: 16  Height:  (1.88 m)  Weight: 236 lb 9.6 oz (107.321 kg)     Physical Exam  Constitutional: He is oriented to person, place, and time and well-developed, well-nourished, and in no distress.  HENT:  Head: Normocephalic and atraumatic.  Eyes: Conjunctivae and EOM are normal. Pupils are equal, round, and reactive to light. No scleral icterus.  Neck: Normal range of motion. Neck supple. Carotid bruit is not present. No thyromegaly present.  Cardiovascular: Normal rate, regular rhythm, normal heart sounds and intact distal pulses.  Exam reveals no gallop and no friction rub.   No murmur heard. Pulmonary/Chest: Effort normal and breath sounds normal. No respiratory distress. He has no wheezes. He has no rales.  Abdominal: Soft. Bowel sounds are normal. He exhibits no distension. There is no tenderness. There is no rebound.  Musculoskeletal: He exhibits no edema.  Lymphadenopathy:    He has no cervical adenopathy.  Neurological: He is alert and oriented to person, place, and time.  Vitals reviewed.     No results  found for this or any previous visit (from the past 2160 hour(s)).   Assessment & Plan  1. Essential hypertension -cont. Lisinopril and Metoprolol.  Take Lisinopril at bedtime.  - hydrochlorothiazide (HYDRODIURIL) 25 MG tablet; Take 1 tablet (25 mg total) by mouth daily.  Dispense: 30 tablet; Refill: 12  2. Need for influenza vaccination  - Flu Vaccine QUAD 36+ mos PF IM (Fluarix & Fluzone Quad PF)

## 2015-02-09 ENCOUNTER — Ambulatory Visit (INDEPENDENT_AMBULATORY_CARE_PROVIDER_SITE_OTHER): Payer: Medicare Other | Admitting: Family Medicine

## 2015-02-09 ENCOUNTER — Encounter: Payer: Self-pay | Admitting: Family Medicine

## 2015-02-09 VITALS — BP 95/65 | HR 82 | Resp 16 | Ht 74.0 in | Wt 238.0 lb

## 2015-02-09 DIAGNOSIS — M549 Dorsalgia, unspecified: Secondary | ICD-10-CM

## 2015-02-09 DIAGNOSIS — F4310 Post-traumatic stress disorder, unspecified: Secondary | ICD-10-CM | POA: Diagnosis not present

## 2015-02-09 DIAGNOSIS — R519 Headache, unspecified: Secondary | ICD-10-CM | POA: Insufficient documentation

## 2015-02-09 DIAGNOSIS — M5489 Other dorsalgia: Secondary | ICD-10-CM

## 2015-02-09 DIAGNOSIS — G8929 Other chronic pain: Secondary | ICD-10-CM | POA: Diagnosis not present

## 2015-02-09 DIAGNOSIS — I10 Essential (primary) hypertension: Secondary | ICD-10-CM

## 2015-02-09 DIAGNOSIS — R51 Headache: Secondary | ICD-10-CM

## 2015-02-09 DIAGNOSIS — Z720 Tobacco use: Secondary | ICD-10-CM | POA: Diagnosis not present

## 2015-02-09 DIAGNOSIS — F310 Bipolar disorder, current episode hypomanic: Secondary | ICD-10-CM | POA: Diagnosis not present

## 2015-02-09 DIAGNOSIS — M546 Pain in thoracic spine: Secondary | ICD-10-CM

## 2015-02-09 DIAGNOSIS — F17200 Nicotine dependence, unspecified, uncomplicated: Secondary | ICD-10-CM

## 2015-02-09 NOTE — Patient Instructions (Signed)
Discontinue HCTZ.  Discussed stopping smoking altogether again.

## 2015-02-09 NOTE — Progress Notes (Signed)
Name: Christian Sanders   MRN: [id number]    DOB: 11/06/1960   Date:02/09/2015       Progress Note  Subjective  Chief Complaint  Chief Complaint  Patient presents with  . Hypertension    has tried to quit smoking and down to 3 daily.     HPI Here for f/u of HBP.  Still smoking but only about 3 a day.  Has chronic daily headacxhes.  Takes [employer] for them daily.  No problem-specific assessment & plan notes found for this encounter.   Past Medical History  Diagnosis Date  . Hypertension   . GERD (gastroesophageal reflux disease)   . Depression   . Bipolar disorder (HCC)   . Anxiety   . Dentures complicating chewing     full upper, partial lower  . Headache     s/p facial injury and reconstruction  . Vertebral column disorder     2 crushed thoracic vertebra  . PTSD (post-traumatic stress disorder)     Social History  Substance Use Topics  . Smoking status: Current Every Day Smoker -- 0.25 packs/day for 30 years    Types: Cigarettes  . Smokeless tobacco: Never Used  . Alcohol Use: 3.6 oz/week    0 Standard drinks or equivalent, 6 Cans of beer per week     Comment: 3 or 4 drinks, once per month     Current outpatient prescriptions:  .  Aspirin-Acetaminophen-Caffeine (GOODY HEADACHE PO), Take by mouth daily as needed., Disp: , Rfl:  .  hydrochlorothiazide (HYDRODIURIL) 25 MG tablet, Take 1 tablet (25 mg total) by mouth daily., Disp: 30 tablet, Rfl: 12 .  lisinopril (PRINIVIL,ZESTRIL) 40 MG tablet, Take 1 tablet (40 mg total) by mouth daily. AM, Disp: 30 tablet, Rfl: 12 .  metoprolol tartrate (LOPRESSOR) 25 MG tablet, Take 1 tablet (25 mg total) by mouth 2 (two) times daily., Disp: 60 tablet, Rfl: 12 .  triamcinolone cream (KENALOG) 0.1 %, Apply 1 application topically 2 (two) times daily., Disp: 28.4 g, Rfl: 1  Allergies  Allergen Reactions  . Pineapple Nausea And Vomiting    Review of Systems  Constitutional: Negative for fever, chills, weight loss and  malaise/fatigue.  HENT: Negative for hearing loss.   Eyes: Negative for blurred vision and double vision.  Respiratory: Negative for cough, sputum production, shortness of breath and wheezing.   Cardiovascular: Negative for chest pain, palpitations and leg swelling.  Gastrointestinal: Negative for heartburn, abdominal pain and blood in stool.  Genitourinary: Negative for dysuria, urgency and frequency.  Musculoskeletal: Positive for back pain. Negative for myalgias.  Skin: Negative for rash.  Neurological: Positive for headaches. Negative for dizziness, tremors and weakness.  Psychiatric/Behavioral: Positive for depression. The patient is nervous/anxious.       Objective  Filed Vitals:   02/09/15 0949  BP: 105/69  Pulse: 74  Resp: 16  Height: 6\' 2"  (1.88 m)  Weight: 238 lb (107.956 kg)     Physical Exam  Constitutional: He is oriented to person, place, and time and well-developed, well-nourished, and in no distress. No distress.  HENT:  Head: Normocephalic and atraumatic.  Eyes: Conjunctivae and EOM are normal. Pupils are equal, round, and reactive to light. No scleral icterus.  Neck: Normal range of motion. Neck supple. Carotid bruit is not present. No thyromegaly present.  Cardiovascular: Normal rate, regular rhythm, normal heart sounds and intact distal pulses.  Exam reveals no gallop and no friction rub.   No murmur heard. Pulmonary/Chest: Effort normal  and breath sounds normal. No respiratory distress. He has no wheezes. He has no rales.  Abdominal: Bowel sounds are normal. He exhibits no distension, no abdominal bruit and no mass. There is no tenderness.  Musculoskeletal: He exhibits no edema.  Lymphadenopathy:    He has no cervical adenopathy.  Neurological: He is alert and oriented to person, place, and time.  Vitals reviewed.     No results found for this or any previous visit (from the past 2160 hour(s)).   Assessment & Plan  1. Essential  hypertension -Discontinue HCTZ  -Continue Metoprolol and Lisinopril  2. Smoker  - Provide Smoking / Tobacco cessation education.  Provide education material and information on community counseling.  3. Chronic mid back pain   4. Bipolar affective disorder, current episode hypomanic (HCC)   5. PTSD (post-traumatic stress disorder)   6. Chronic nonintractable headache, unspecified headache type

## 2015-04-27 ENCOUNTER — Ambulatory Visit: Payer: Medicare Other | Admitting: Family Medicine

## 2015-06-03 ENCOUNTER — Encounter: Payer: Self-pay | Admitting: Family Medicine

## 2015-06-03 ENCOUNTER — Ambulatory Visit: Payer: Medicare Other | Admitting: Family Medicine

## 2015-06-03 ENCOUNTER — Ambulatory Visit (INDEPENDENT_AMBULATORY_CARE_PROVIDER_SITE_OTHER): Payer: Medicare Other | Admitting: Family Medicine

## 2015-06-03 VITALS — BP 150/105 | HR 68 | Temp 98.3°F | Resp 16 | Ht 74.0 in | Wt 224.0 lb

## 2015-06-03 DIAGNOSIS — I10 Essential (primary) hypertension: Secondary | ICD-10-CM

## 2015-06-03 MED ORDER — LISINOPRIL 40 MG PO TABS: 40.0000 mg | ORAL_TABLET | Freq: Every day | ORAL | Status: DC

## 2015-06-03 MED ORDER — METOPROLOL TARTRATE 50 MG PO TABS: 50.0000 mg | ORAL_TABLET | Freq: Two times a day (BID) | ORAL | Status: DC

## 2015-06-03 NOTE — Progress Notes (Signed)
Name: Christian Sanders   MRN: [id number]    DOB: [birth year]   Date:06/03/2015       Progress Note  Subjective  Chief Complaint  Chief Complaint  Patient presents with  . Follow-up    Hypertension    HPI Here for f/u of HBP.  He has chronic back pain but has stopped going to pain management clinic because of transportation issue. C/o chronic headaches.  Taking "lots" of Goody powders No problem-specific assessment & plan notes found for this encounter.   Past Medical History  Diagnosis Date  . Hypertension   . GERD (gastroesophageal reflux disease)   . Depression   . Bipolar disorder (HCC)   . Anxiety   . Dentures complicating chewing     full upper, partial lower  . Headache     s/p facial injury and reconstruction  . Vertebral column disorder     2 crushed thoracic vertebra  . PTSD (post-traumatic stress disorder)     Past Surgical History  Procedure Laterality Date  . Facial reconstruction surgery      eye socket with 2 metal plates  . Colonoscopy with propofol N/A 10/09/2014    Procedure: COLONOSCOPY WITH PROPOFOL;  Surgeon: Christian Minium, MD;  Location: [facility];  Service: Endoscopy;  Laterality: N/A;    Family History  Problem Relation Age of Onset  . Family history unknown: Yes    Social History   Social History  . Marital Status: Married    Spouse Name: N/A  . Number of Children: N/A  . Years of Education: N/A   Occupational History  . Not on file.   Social History Main Topics  . Smoking status: Current Every Day Smoker -- 0.25 packs/day for 30 years    Types: Cigarettes  . Smokeless tobacco: Never Used  . Alcohol Use: 3.6 oz/week    0 Standard drinks or equivalent, 6 Cans of beer per week     Comment: 3 or 4 drinks, once per month  . Drug Use: No  . Sexual Activity: Not on file   Other Topics Concern  . Not on file   Social History Narrative     Current outpatient prescriptions:  .  Aspirin-Acetaminophen-Caffeine (GOODY HEADACHE PO),  Take by mouth daily as needed., Disp: , Rfl:  .  lisinopril (PRINIVIL,ZESTRIL) 40 MG tablet, Take 1 tablet (40 mg total) by mouth daily. AM, Disp: 30 tablet, Rfl: 12 .  metoprolol tartrate (LOPRESSOR) 50 MG tablet, Take 1 tablet (50 mg total) by mouth 2 (two) times daily., Disp: 60 tablet, Rfl: 12 .  triamcinolone cream (KENALOG) 0.1 %, Apply 1 application topically 2 (two) times daily., Disp: 28.4 g, Rfl: 1  Allergies  Allergen Reactions  . Pineapple Nausea And Vomiting  . Pollen Extract Other (See Comments)    Runny nose, itchy, watery eyes, congestion     Review of Systems  Constitutional: Negative for fever, chills, weight loss and malaise/fatigue.  HENT: Positive for congestion. Negative for hearing loss.   Eyes: Negative for blurred vision and double vision.  Respiratory: Negative for cough, shortness of breath and wheezing.   Cardiovascular: Negative for chest pain, palpitations and leg swelling.  Gastrointestinal: Negative for heartburn, abdominal pain and blood in stool.  Genitourinary: Negative for dysuria, urgency and frequency.  Musculoskeletal: Positive for back pain. Negative for myalgias.  Skin: Negative for rash.  Neurological: Positive for headaches. Negative for tremors and weakness.      Objective  Filed Vitals:  06/03/15 1548 06/03/15 1620  BP: 151/110 150/105  Pulse: 72 68  Temp: 98.3 F (36.8 C)   TempSrc: Oral   Resp: 16   Height:  (1.88 m)   Weight: 224 lb (101.606 kg)   SpO2: 98%     Physical Exam  Constitutional: He is oriented to person, place, and time and well-developed, well-nourished, and in no distress. No distress.  HENT:  Head: Normocephalic and atraumatic.  Eyes: Conjunctivae are normal. Pupils are equal, round, and reactive to light. No scleral icterus.  Neck: Neck supple. Carotid bruit is not present. No thyromegaly present.  Cardiovascular: Normal rate, regular rhythm and normal heart sounds.  Exam reveals no gallop and no  friction rub.   No murmur heard. Pulmonary/Chest: Effort normal and breath sounds normal. No respiratory distress. He has no wheezes. He has no rales.  Musculoskeletal: He exhibits no edema.  Lymphadenopathy:    He has no cervical adenopathy.  Neurological: He is alert and oriented to person, place, and time. No cranial nerve deficit. Gait normal.  Vitals reviewed.      No results found for this or any previous visit (from the past 2160 hour(s)).   Assessment & Plan  Problem List Items Addressed This Visit      Cardiovascular and Mediastinum   Hypertension - Primary   Relevant Medications   metoprolol tartrate (LOPRESSOR) 50 MG tablet   lisinopril (PRINIVIL,ZESTRIL) 40 MG tablet      Meds ordered this encounter  Medications  . metoprolol tartrate (LOPRESSOR) 50 MG tablet    Sig: Take 1 tablet (50 mg total) by mouth 2 (two) times daily.    Dispense:  60 tablet    Refill:  12  . lisinopril (PRINIVIL,ZESTRIL) 40 MG tablet    Sig: Take 1 tablet (40 mg total) by mouth daily. AM    Dispense:  30 tablet    Refill:  12   1. Essential hypertension  - metoprolol tartrate (LOPRESSOR) 50 MG tablet; Take 1 tablet (50 mg total) by mouth 2 (two) times daily.  Dispense: 60 tablet; Refill: 12 - lisinopril (PRINIVIL,ZESTRIL) 40 MG tablet; Take 1 tablet (40 mg total) by mouth daily. AM  Dispense: 30 tablet; Refill: 12

## 2015-06-07 ENCOUNTER — Other Ambulatory Visit: Payer: Self-pay | Admitting: Family Medicine

## 2015-07-22 ENCOUNTER — Ambulatory Visit (INDEPENDENT_AMBULATORY_CARE_PROVIDER_SITE_OTHER): Payer: Medicare Other | Admitting: Family Medicine

## 2015-07-22 ENCOUNTER — Encounter: Payer: Self-pay | Admitting: Family Medicine

## 2015-07-22 VITALS — BP 160/100 | HR 60 | Temp 98.0°F | Resp 16 | Ht 74.0 in | Wt 225.0 lb

## 2015-07-22 DIAGNOSIS — J302 Other seasonal allergic rhinitis: Secondary | ICD-10-CM | POA: Diagnosis not present

## 2015-07-22 DIAGNOSIS — I10 Essential (primary) hypertension: Secondary | ICD-10-CM | POA: Diagnosis not present

## 2015-07-22 MED ORDER — LORATADINE 10 MG PO TABS: 10.0000 mg | ORAL_TABLET | Freq: Every day | ORAL | Status: DC

## 2015-07-22 MED ORDER — AMLODIPINE BESYLATE 5 MG PO TABS: 5.0000 mg | ORAL_TABLET | Freq: Every day | ORAL | Status: DC

## 2015-07-22 NOTE — Progress Notes (Signed)
Name: Christian Sanders   MRN: [id number]    DOB: [birth year]   Date:07/22/2015       Progress Note  Subjective  Chief Complaint  Chief Complaint  Patient presents with  . Hypertension    smoking less than 5 ciggerettes a day.   Marland Kitchen Headache    all day today     HPI Here for f/u of HBP.  He has had a mild frontal HA for past day.  May be allergies.  He is taking both BP pills.  He is down to  1/4 ppd. Of cigs  No problem-specific assessment & plan notes found for this encounter.   Past Medical History  Diagnosis Date  . Hypertension   . GERD (gastroesophageal reflux disease)   . Depression   . Bipolar disorder (HCC)   . Anxiety   . Dentures complicating chewing     full upper, partial lower  . Headache     s/p facial injury and reconstruction  . Vertebral column disorder     2 crushed thoracic vertebra  . PTSD (post-traumatic stress disorder)     Past Surgical History  Procedure Laterality Date  . Facial reconstruction surgery      eye socket with 2 metal plates  . Colonoscopy with propofol N/A 10/09/2014    Procedure: COLONOSCOPY WITH PROPOFOL;  Surgeon: Midge Minium, MD;  Location: [facility];  Service: Endoscopy;  Laterality: N/A;    Family History  Problem Relation Age of Onset  . Family history unknown: Yes    Social History   Social History  . Marital Status: Married    Spouse Name: N/A  . Number of Children: N/A  . Years of Education: N/A   Occupational History  . Not on file.   Social History Main Topics  . Smoking status: Current Every Day Smoker -- 0.25 packs/day for 30 years    Types: Cigarettes  . Smokeless tobacco: Never Used  . Alcohol Use: 3.6 oz/week    0 Standard drinks or equivalent, 6 Cans of beer per week     Comment: 3 or 4 drinks, once per month  . Drug Use: No  . Sexual Activity: Not on file   Other Topics Concern  . Not on file   Social History Narrative     Current outpatient prescriptions:  .   Aspirin-Acetaminophen-Caffeine (GOODY HEADACHE PO), Take by mouth daily as needed., Disp: , Rfl:  .  lisinopril (PRINIVIL,ZESTRIL) 40 MG tablet, Take 1 tablet (40 mg total) by mouth daily. AM, Disp: 30 tablet, Rfl: 12 .  metoprolol tartrate (LOPRESSOR) 50 MG tablet, Take 1 tablet (50 mg total) by mouth 2 (two) times daily., Disp: 60 tablet, Rfl: 12 .  omeprazole (PRILOSEC) 20 MG capsule, TAKE 1 CAPSULE BY MOUTH TWICE DAILY AS NEEDED., Disp: 60 capsule, Rfl: 6 .  amLODipine (NORVASC) 5 MG tablet, Take 1 tablet (5 mg total) by mouth daily., Disp: 30 tablet, Rfl: 6 .  loratadine (CLARITIN) 10 MG tablet, Take 1 tablet (10 mg total) by mouth daily., Disp: 30 tablet, Rfl: 11  Allergies  Allergen Reactions  . Pineapple Nausea And Vomiting  . Pollen Extract Other (See Comments)    Runny nose, itchy, watery eyes, congestion     Review of Systems  Constitutional: Negative for fever, chills, weight loss and malaise/fatigue.  HENT: Positive for congestion. Negative for hearing loss.   Eyes: Negative for blurred vision and double vision.  Respiratory: Negative for cough, shortness of breath and  wheezing.   Cardiovascular: Negative for chest pain, palpitations and leg swelling.  Gastrointestinal: Negative for heartburn, abdominal pain and blood in stool.  Genitourinary: Negative for dysuria, urgency and frequency.  Musculoskeletal: Negative for myalgias and joint pain.  Skin: Negative for rash.  Neurological: Positive for headaches. Negative for dizziness, tremors and weakness.      Objective  Filed Vitals:   07/22/15 1319 07/22/15 1402  BP: 170/122 160/100  Pulse: 60   Temp: 98 F (36.7 C)   TempSrc: Oral   Resp: 16   Height: 6\' 2"  (1.88 m)   Weight: 225 lb (102.059 kg)     Physical Exam  Constitutional: He is oriented to person, place, and time and well-developed, well-nourished, and in no distress. No distress.  HENT:  Head: Normocephalic and atraumatic.  Nose: Rhinorrhea (clear)  present.  Eyes: Conjunctivae and EOM are normal. Pupils are equal, round, and reactive to light. No scleral icterus.  Neck: Normal range of motion. Neck supple. No thyromegaly present.  Cardiovascular: Normal rate, regular rhythm and normal heart sounds.  Exam reveals no gallop and no friction rub.   No murmur heard. Pulmonary/Chest: Effort normal and breath sounds normal. No respiratory distress. He has no wheezes. He has no rales.  Musculoskeletal: He exhibits no edema.  Lymphadenopathy:    He has no cervical adenopathy.  Neurological: He is alert and oriented to person, place, and time.  Vitals reviewed.      No results found for this or any previous visit (from the past 2160 hour(s)).   Assessment & Plan  Problem List Items Addressed This Visit      Cardiovascular and Mediastinum   Hypertension - Primary   Relevant Medications   amLODipine (NORVASC) 5 MG tablet    Other Visit Diagnoses    Seasonal allergies        Relevant Medications    loratadine (CLARITIN) 10 MG tablet       Meds ordered this encounter  Medications  . amLODipine (NORVASC) 5 MG tablet    Sig: Take 1 tablet (5 mg total) by mouth daily.    Dispense:  30 tablet    Refill:  6  . loratadine (CLARITIN) 10 MG tablet    Sig: Take 1 tablet (10 mg total) by mouth daily.    Dispense:  30 tablet    Refill:  11   1. Essential hypertension Cont. Lisinopril and Metoprolol - amLODipine (NORVASC) 5 MG tablet; Take 1 tablet (5 mg total) by mouth daily.  Dispense: 30 tablet; Refill: 6  2. Seasonal allergies  - loratadine (CLARITIN) 10 MG tablet; Take 1 tablet (10 mg total) by mouth daily.  Dispense: 30 tablet; Refill: 11

## 2015-09-02 ENCOUNTER — Ambulatory Visit: Payer: Medicare Other | Admitting: Family Medicine

## 2015-09-14 DIAGNOSIS — F439 Reaction to severe stress, unspecified: Secondary | ICD-10-CM | POA: Diagnosis not present

## 2015-10-11 ENCOUNTER — Ambulatory Visit: Payer: Medicare Other | Admitting: Family Medicine

## 2015-11-10 DIAGNOSIS — F438 Other reactions to severe stress: Secondary | ICD-10-CM | POA: Diagnosis not present

## 2015-11-30 ENCOUNTER — Other Ambulatory Visit: Payer: Self-pay | Admitting: Family Medicine

## 2015-12-28 ENCOUNTER — Ambulatory Visit (INDEPENDENT_AMBULATORY_CARE_PROVIDER_SITE_OTHER): Payer: Medicare Other | Admitting: Family Medicine

## 2015-12-28 ENCOUNTER — Encounter: Payer: Self-pay | Admitting: Family Medicine

## 2015-12-28 ENCOUNTER — Other Ambulatory Visit: Payer: Self-pay | Admitting: *Deleted

## 2015-12-28 VITALS — BP 138/95 | HR 74 | Temp 98.1°F | Resp 16 | Ht 74.0 in | Wt 227.0 lb

## 2015-12-28 DIAGNOSIS — Z23 Encounter for immunization: Secondary | ICD-10-CM | POA: Diagnosis not present

## 2015-12-28 DIAGNOSIS — I10 Essential (primary) hypertension: Secondary | ICD-10-CM

## 2015-12-28 DIAGNOSIS — N529 Male erectile dysfunction, unspecified: Secondary | ICD-10-CM

## 2015-12-28 DIAGNOSIS — L821 Other seborrheic keratosis: Secondary | ICD-10-CM

## 2015-12-28 DIAGNOSIS — Z720 Tobacco use: Secondary | ICD-10-CM | POA: Diagnosis not present

## 2015-12-28 MED ORDER — AMLODIPINE BESYLATE 10 MG PO TABS: 10.0000 mg | ORAL_TABLET | Freq: Every day | ORAL | 5 refills | Status: DC

## 2015-12-28 MED ORDER — SILDENAFIL CITRATE 20 MG PO TABS: ORAL_TABLET | ORAL | 2 refills | Status: DC

## 2015-12-28 NOTE — Assessment & Plan Note (Signed)
Consistent with benign SK on Right upper back, prior problem with scratched and bleeding. Now stable and non inflamed. - Schedule future procedure visit to remove if interested, can do cryotherapy vs shave removal

## 2015-12-28 NOTE — Patient Instructions (Signed)
Thank you for coming in to clinic today.  1. Your blood pressure is improved on the new regimen. However it is still right at borderline elevated, your bottom number is actually closer to 95 to 100. - We will increase Amlodipine to 10mg  daily (finish your current 5 mg tabs, take 2 daily at same time), sent new rx for 10mg  tabs to pharmacy - No other changes to BP meds at this time, future may consider an additional low dose if needed can do combo pills  2. For Smoking, strongly encouraged to cut back and quit - 1 800-QUIT NOW for counseling and therapy advice - Consider Wellbutrin or Zyban - discuss this med with your Psychiatrist, as it is an anti-depressant as well.  3. For the skin growth on back, it is a Seborrheic Keratosis. It is benign, not cancer. - We can freeze it off or cut it off, schedule a separate appointment for this procedure in the future if you would like to remove it.  Flu shot today, you may get immune response, it should pass. Take Tylenol as needed.  Please schedule a follow-up appointment with Dr. Juanetta GoslingHawkins in 1 to 3 months for Annual Physical with fasting blood work (no food or drink, except water and black coffee after midnight before), re-check BP follow-up  If you have any other questions or concerns, please feel free to call the clinic or send a message through MyChart. You may also schedule an earlier appointment if necessary.  Saralyn PilarAlexander Darcia Lampi, DO [facility]

## 2015-12-28 NOTE — Assessment & Plan Note (Signed)
Active smoker, increased now up to 0.5ppd. Prior failed Chantix (side effects), not tolerate NRT patch or gum. Smoking cessation counseling today, consider future Wellbutrin, advised to discuss with Psych, and given quitline resource.

## 2015-12-28 NOTE — Assessment & Plan Note (Signed)
Significantly improved BP today after switch HCTZ for Amlodipine 5, near goal but still elevated DBP >95-100. Still smoking, has increased to 0.5ppd, limited exercise.  Plan: 1. Increase Amlodipine to 10mg  daily - finish old pills, sent new rx 2. Continue Lisinopril 40mg  and Metoprolol 50 BID 3. Remain off thiazide, however would consider re-visiting this with alternative thiazide in future, can consider combo pill as well 4. Check BP outside office, bring readings 5. Smoking cessation 6. Follow-up within 1-3 months for annual physical with fasting labs, CMET, Lipids

## 2015-12-28 NOTE — Assessment & Plan Note (Addendum)
Suspect related to HTN and smoking. Refilled Sildenafil

## 2015-12-28 NOTE — Progress Notes (Signed)
Subjective:    Patient ID: Christian Sanders, male    DOB: 02/17/1961, 55 y.o.   MRN: [id number]  Lawerance CruelLawerance CruelKarl Chronister is a 55 y.o. male presenting on 12/28/2015 for Follow-up (BP)   HPI   CHRONIC HTN: Reports doing well. Feels well. Also admits seeing his Psychiatrist and treated with Xanax regularly, thinks this helps his BP. Not checking BP outside office. [facility], added Amlodipine, previously discontinued HCTZ due to ineffective. Current Meds - Amlodipine 5mg , Metoprolol 50mg  BID, Lisinopril 40mg    Reports good compliance, took meds today. Tolerating well, w/o complaints. Lifestyle - limited regular exercise, admits to drinking caffeine (had Monster energy drink today) Admits occasional dizziness briefly only when standing quickly, but resolved Admits occasional heartburn Denies CP, dyspnea, HA, edema, lightheadedness  TOBACCO ABUSE: - Active smoker, now back up to 0.5 ppd, previously down to 3 cigs - Interested in quitting but not quite ready. Had quit before for 9 months. Tried Chantix with help but had bad dreams. Unsuccessful with NRT in past. Never tried Wellbutrin.  Seborrheic Keratosis, Right upper back - Reports skin growth thinks it may have grown over past several months, unsure timeline. Only concern was recently he scratched it and it bled, he put a bandaid on it to keep it intact, and it has healed. Wants it looked at today - No history of skin cancer - Denies any redness, swelling, pain, drainage of pus or bleeding, itching, other skin growths or abnormal moles  Erectile Dysfunction: - Chronic problem, currently doing well on sildenafil PRN, takes 3-4 per dose, not taking 5 usually, seems to have significant improvement - Request refill, out today, needs 50 pills and refills  Health Maintenance: - Due for Flu shot, will get today. Initially he declined due to concern of fever or sweats after shot years passed, counseled on immune response following  vaccine.  Social History  Substance Use Topics  . Smoking status: Current Every Day Smoker    Packs/day: 0.25    Years: 30.00    Types: Cigarettes  . Smokeless tobacco: Never Used  . Alcohol use 3.6 oz/week    6 Cans of beer per week     Comment: 3 or 4 drinks, once per month    Review of Systems Per HPI unless specifically indicated above     Objective:    BP (!) 138/95 (BP Location: Left Arm, Cuff Size: Normal)   Pulse 74   Temp 98.1 F (36.7 C) (Oral)   Resp 16   Ht 6\' 2"  (1.88 m)   Wt 227 lb (103 kg)   BMI 29.15 kg/m   Wt Readings from Last 3 Encounters:  12/28/15 227 lb (103 kg)  07/22/15 225 lb (102.1 kg)  06/03/15 224 lb (101.6 kg)    Physical Exam  Constitutional: He is oriented to person, place, and time. He appears well-developed and well-nourished. No distress.  Well-appearing, comfortable, cooperative  HENT:  Head: Normocephalic and atraumatic.  Mouth/Throat: Oropharynx is clear and moist.  Cardiovascular: Normal rate, regular rhythm, normal heart sounds and intact distal pulses.   No murmur heard. Pulmonary/Chest: Effort normal and breath sounds normal. No respiratory distress. He has no wheezes. He has no rales.  Musculoskeletal: Normal range of motion. He exhibits no edema or tenderness.  Neurological: He is alert and oriented to person, place, and time.  Skin: Skin is warm and dry. He is not diaphoretic.  Right upper back - Small 1 x 1 cm raised  stuck on brown variable texture lesion, consistent with non inflamed Seborrheic Keratosis  Nursing note and vitals reviewed.           Assessment & Plan:   Problem List Items Addressed This Visit    Tobacco abuse    Active smoker, increased now up to 0.5ppd. Prior failed Chantix (side effects), not tolerate NRT patch or gum. Smoking cessation counseling today, consider future Wellbutrin, advised to discuss with Psych, and given quitline resource.      SK (seborrheic keratosis)    Consistent  with benign SK on Right upper back, prior problem with scratched and bleeding. Now stable and non inflamed. - Schedule future procedure visit to remove if interested, can do cryotherapy vs shave removal      Hypertension - Primary    Significantly improved BP today after switch HCTZ for Amlodipine 5, near goal but still elevated DBP >95-100. Still smoking, has increased to 0.5ppd, limited exercise.  Plan: 1. Increase Amlodipine to 10mg  daily - finish old pills, sent new rx 2. Continue Lisinopril 40mg  and Metoprolol 50 BID 3. Remain off thiazide, however would consider re-visiting this with alternative thiazide in future, can consider combo pill as well 4. Check BP outside office, bring readings 5. Smoking cessation 6. Follow-up within 1-3 months for annual physical with fasting labs, CMET, Lipids      Relevant Medications   amLODipine (NORVASC) 10 MG tablet   sildenafil (REVATIO) 20 MG tablet   ED (erectile dysfunction)    Suspect related to HTN and smoking. Refilled Sildenafil      Relevant Medications   sildenafil (REVATIO) 20 MG tablet    Other Visit Diagnoses    Needs flu shot       Relevant Orders   Flu Vaccine QUAD 36+ mos IM (Completed)      Meds ordered this encounter  Medications  . amLODipine (NORVASC) 10 MG tablet    Sig: Take 1 tablet (10 mg total) by mouth daily.    Dispense:  30 tablet    Refill:  5  . sildenafil (REVATIO) 20 MG tablet    Sig: TAKE 1 TO 5 TABLETS BY MOUTH ONCE DAILY AS NEEDED    Dispense:  50 tablet    Refill:  2      Follow up plan: Return in about 2 months (around 02/27/2016) for blood pressure, annual physical for fasting labs.  Saralyn Pilar, DO [facility] 12/28/2015, 11:56 AM

## 2015-12-29 DIAGNOSIS — F438 Other reactions to severe stress: Secondary | ICD-10-CM | POA: Diagnosis not present

## 2016-02-09 DIAGNOSIS — F438 Other reactions to severe stress: Secondary | ICD-10-CM | POA: Diagnosis not present

## 2016-02-28 ENCOUNTER — Ambulatory Visit (INDEPENDENT_AMBULATORY_CARE_PROVIDER_SITE_OTHER): Payer: Medicare Other | Admitting: Family Medicine

## 2016-02-28 ENCOUNTER — Other Ambulatory Visit: Payer: Self-pay | Admitting: Family Medicine

## 2016-02-28 ENCOUNTER — Encounter: Payer: Self-pay | Admitting: Family Medicine

## 2016-02-28 ENCOUNTER — Other Ambulatory Visit: Payer: Self-pay | Admitting: *Deleted

## 2016-02-28 VITALS — BP 116/76 | HR 71 | Temp 98.3°F | Resp 16 | Ht 74.0 in | Wt 226.0 lb

## 2016-02-28 DIAGNOSIS — G8929 Other chronic pain: Secondary | ICD-10-CM

## 2016-02-28 DIAGNOSIS — Z0000 Encounter for general adult medical examination without abnormal findings: Secondary | ICD-10-CM

## 2016-02-28 DIAGNOSIS — F310 Bipolar disorder, current episode hypomanic: Secondary | ICD-10-CM | POA: Diagnosis not present

## 2016-02-28 DIAGNOSIS — M546 Pain in thoracic spine: Secondary | ICD-10-CM

## 2016-02-28 DIAGNOSIS — N529 Male erectile dysfunction, unspecified: Secondary | ICD-10-CM

## 2016-02-28 DIAGNOSIS — R748 Abnormal levels of other serum enzymes: Secondary | ICD-10-CM | POA: Diagnosis not present

## 2016-02-28 DIAGNOSIS — I10 Essential (primary) hypertension: Secondary | ICD-10-CM

## 2016-02-28 DIAGNOSIS — F4310 Post-traumatic stress disorder, unspecified: Secondary | ICD-10-CM

## 2016-02-28 DIAGNOSIS — N400 Enlarged prostate without lower urinary tract symptoms: Secondary | ICD-10-CM | POA: Diagnosis not present

## 2016-02-28 MED ORDER — SILDENAFIL CITRATE 20 MG PO TABS
ORAL_TABLET | ORAL | 6 refills | Status: DC
Start: 2016-02-28 — End: 2017-05-08

## 2016-02-28 NOTE — Progress Notes (Signed)
Name: Christian Sanders   MRN: [id number]    DOB: 03/08/1961   Date:02/28/2016       Progress Note  Subjective  Chief Complaint  Chief Complaint  Patient presents with  . Annual Exam    HPI  Here for annual health maintenance physical.  Has HBP and ED and GERD.  Also with metal health issue (PTSD).   Overall doing pretty well. No problem-specific Assessment & Plan notes found for this encounter.   Past Medical History:  Diagnosis Date  . Anxiety   . Bipolar disorder (HCC)   . Dentures complicating chewing    full upper, partial lower  . Depression   . GERD (gastroesophageal reflux disease)   . Headache    s/p facial injury and reconstruction  . Hypertension   . PTSD (post-traumatic stress disorder)   . Vertebral column disorder    2 crushed thoracic vertebra    Past Surgical History:  Procedure Laterality Date  . COLONOSCOPY WITH PROPOFOL N/A 10/09/2014   Procedure: COLONOSCOPY WITH PROPOFOL;  Surgeon: Midge Miniumarren Wohl, MD;  [facility];  Service: Endoscopy;  Laterality: N/A;  . FACIAL RECONSTRUCTION SURGERY     eye socket with 2 metal plates    Family History  Problem Relation Age of Onset  . Family history unknown: Yes    Social History   Social History  . Marital status: Married    Spouse name: N/A  . Number of children: N/A  . Years of education: N/A   Occupational History  . Not on file.   Social History Main Topics  . Smoking status: Current Every Day Smoker    Packs/day: 0.25    Years: 30.00    Types: Cigarettes  . Smokeless tobacco: Never Used  . Alcohol use 3.6 oz/week    6 Cans of beer per week     Comment: 3 or 4 drinks, once per month  . Drug use: No  . Sexual activity: Not on file   Other Topics Concern  . Not on file   Social History Narrative  . No narrative on file     Current Outpatient Prescriptions:  .  ALPRAZolam (XANAX) 0.5 MG tablet, Take 0.5 mg by mouth daily as needed., Disp: , Rfl:  .  amLODipine (NORVASC) 10  MG tablet, Take 1 tablet (10 mg total) by mouth daily., Disp: 30 tablet, Rfl: 5 .  Aspirin-Acetaminophen-Caffeine (GOODY HEADACHE PO), Take by mouth daily as needed., Disp: , Rfl:  .  lisinopril (PRINIVIL,ZESTRIL) 40 MG tablet, Take 1 tablet (40 mg total) by mouth daily. AM, Disp: 30 tablet, Rfl: 12 .  loratadine (CLARITIN) 10 MG tablet, Take 1 tablet (10 mg total) by mouth daily., Disp: 30 tablet, Rfl: 11 .  metoprolol tartrate (LOPRESSOR) 50 MG tablet, Take 1 tablet (50 mg total) by mouth 2 (two) times daily., Disp: 60 tablet, Rfl: 12 .  omeprazole (PRILOSEC) 20 MG capsule, TAKE 1 CAPSULE BY MOUTH TWICE DAILY AS NEEDED., Disp: 60 capsule, Rfl: 6 .  sildenafil (REVATIO) 20 MG tablet, TAKE 1 TO 5 TABLETS BY MOUTH ONCE DAILY AS NEEDED, Disp: 50 tablet, Rfl: 6 .  LATUDA 40 MG TABS tablet, Take 40 mg by mouth daily., Disp: , Rfl:   Allergies  Allergen Reactions  . Bee Venom Swelling    Throat and Tongue   . Pineapple Nausea And Vomiting  . Pollen Extract Other (See Comments)    Runny nose, itchy, watery eyes, congestion     Review  of Systems  Constitutional: Negative for chills, fever, malaise/fatigue and weight loss.  HENT: Negative for hearing loss and tinnitus.   Eyes: Negative for blurred vision and double vision.  Respiratory: Negative for cough, shortness of breath and wheezing.   Cardiovascular: Negative for chest pain, palpitations and leg swelling.  Gastrointestinal: Negative for abdominal pain, blood in stool and heartburn.  Genitourinary: Negative for dysuria, frequency and urgency.  Musculoskeletal: Negative for joint pain and myalgias.  Skin: Negative for rash.  Neurological: Negative for dizziness, tingling, tremors, weakness and headaches.      Objective  Vitals:   02/28/16 1346  BP: 116/76  Pulse: 71  Resp: 16  Temp: 98.3 F (36.8 C)  TempSrc: Oral  Weight: 102.5 kg (226 lb)  Height: 6\' 2"  (1.88 m)    Physical Exam  Constitutional: He is oriented to  person, place, and time and well-developed, well-nourished, and in no distress. No distress.  HENT:  Head: Normocephalic and atraumatic.  Right Ear: External ear normal.  Left Ear: External ear normal.  Nose: Nose normal.  Mouth/Throat: Oropharynx is clear and moist.  Eyes: Conjunctivae and EOM are normal. Pupils are equal, round, and reactive to light. No scleral icterus.  Fundoscopic exam:      The right eye shows no arteriolar narrowing, no AV nicking, no exudate, no hemorrhage and no papilledema.       The left eye shows no arteriolar narrowing, no AV nicking, no exudate, no hemorrhage and no papilledema.  Neck: Normal range of motion. Neck supple. Carotid bruit is not present. No thyromegaly present.  Cardiovascular: Normal rate, regular rhythm and normal heart sounds.  Exam reveals no gallop and no friction rub.   No murmur heard. Pulmonary/Chest: Effort normal and breath sounds normal. No respiratory distress. He has no wheezes. He has no rales. Right breast exhibits no inverted nipple, no mass, no nipple discharge, no skin change and no tenderness. Left breast exhibits no inverted nipple, no mass, no nipple discharge, no skin change and no tenderness. Breasts are symmetrical.  Abdominal: Soft. Bowel sounds are normal. He exhibits no distension, no abdominal bruit and no mass. There is no tenderness.  Genitourinary: Rectum normal and penis normal. Prostate is enlarged.  Genitourinary Comments: Prostate 1+ enlarged, symmetrical, no nodules, not tender.  Musculoskeletal: He exhibits no edema.  Lymphadenopathy:    He has no cervical adenopathy.  Neurological: He is alert and oriented to person, place, and time. No cranial nerve deficit. Gait normal.  Skin: Skin is warm and dry.  Psychiatric: Mood, memory, affect and judgment normal.  Vitals reviewed.      No results found for this or any previous visit (from the past 2160 hour(s)).   Assessment & Plan  Problem List Items  Addressed This Visit      Cardiovascular and Mediastinum   Hypertension   Relevant Medications   sildenafil (REVATIO) 20 MG tablet   Other Relevant Orders   COMPLETE METABOLIC PANEL WITH GFR   CBC with Differential   Lipid panel     Genitourinary   ED (erectile dysfunction)   Relevant Medications   sildenafil (REVATIO) 20 MG tablet   BPH without urinary obstruction   Relevant Orders   PSA     Other   Chronic mid back pain   Bipolar disorder (HCC)   PTSD (post-traumatic stress disorder)    Other Visit Diagnoses    Health maintenance examination    -  Primary      Meds  ordered this encounter  Medications  . sildenafil (REVATIO) 20 MG tablet    Sig: TAKE 1 TO 5 TABLETS BY MOUTH ONCE DAILY AS NEEDED    Dispense:  50 tablet    Refill:  6   1. Health maintenance examination   2. Essential hypertension Cont Amlodipine, Lisinopril, metyoprolol - COMPLETE METABOLIC PANEL WITH GFR - CBC with Differential - Lipid panel  3. Erectile dysfunction, unspecified erectile dysfunction type  - sildenafil (REVATIO) 20 MG tablet; TAKE 1 TO 5 TABLETS BY MOUTH ONCE DAILY AS NEEDED  Dispense: 50 tablet; Refill: 6  4. PTSD (post-traumatic stress disorder)   5. Bipolar affective disorder, current episode hypomanic (HCC)   6. Chronic bilateral thoracic back pain   7. BPH without urinary obstruction  - PSA

## 2016-02-29 ENCOUNTER — Encounter: Payer: Medicare Other | Admitting: Family Medicine

## 2016-03-01 ENCOUNTER — Other Ambulatory Visit: Payer: Medicare Other

## 2016-03-01 DIAGNOSIS — N400 Enlarged prostate without lower urinary tract symptoms: Secondary | ICD-10-CM | POA: Diagnosis not present

## 2016-03-01 DIAGNOSIS — I10 Essential (primary) hypertension: Secondary | ICD-10-CM | POA: Diagnosis not present

## 2016-03-01 LAB — CBC WITH DIFFERENTIAL/PLATELET
Basophils Absolute: 76 cells/uL (ref 0–200)
Basophils Relative: 1 %
Eosinophils Absolute: 228 cells/uL (ref 15–500)
Eosinophils Relative: 3 %
HCT: 46.7 % (ref 38.5–50.0)
HEMOGLOBIN: 16.5 g/dL (ref 13.2–17.1)
Lymphocytes Relative: 34 %
Lymphs Abs: 2584 cells/uL (ref 850–3900)
MCH: 36.7 pg — AB (ref 27.0–33.0)
MCHC: 35.3 g/dL (ref 32.0–36.0)
MCV: 104 fL — ABNORMAL HIGH (ref 80.0–100.0)
MPV: 11.1 fL (ref 7.5–12.5)
Monocytes Absolute: 836 cells/uL (ref 200–950)
Monocytes Relative: 11 %
NEUTROS ABS: 3876 {cells}/uL (ref 1500–7800)
Neutrophils Relative %: 51 %
Platelets: 229 10*3/uL (ref 140–400)
RBC: 4.49 MIL/uL (ref 4.20–5.80)
RDW: 13.1 % (ref 11.0–15.0)
WBC: 7.6 10*3/uL (ref 3.8–10.8)

## 2016-03-01 LAB — COMPLETE METABOLIC PANEL WITH GFR
ALBUMIN: 4.1 g/dL (ref 3.6–5.1)
ALT: 108 U/L — ABNORMAL HIGH (ref 9–46)
AST: 97 U/L — ABNORMAL HIGH (ref 10–35)
Alkaline Phosphatase: 114 U/L (ref 40–115)
BILIRUBIN TOTAL: 1.4 mg/dL — AB (ref 0.2–1.2)
BUN: 7 mg/dL (ref 7–25)
CALCIUM: 9.8 mg/dL (ref 8.6–10.3)
CO2: 27 mmol/L (ref 20–31)
Chloride: 99 mmol/L (ref 98–110)
Creat: 0.86 mg/dL (ref 0.70–1.33)
GFR, Est Non African American: >89 mL/min (ref >=60)
Glucose, Bld: 96 mg/dL (ref 65–99)
Potassium: 4.5 mmol/L (ref 3.5–5.3)
Sodium: 136 mmol/L (ref 135–146)
TOTAL PROTEIN: 8 g/dL (ref 6.1–8.1)

## 2016-03-01 LAB — LIPID PANEL
Cholesterol: 188 mg/dL (ref <200)
HDL: 37 mg/dL — ABNORMAL LOW (ref >40)
LDL CALC: 117 mg/dL — AB (ref <100)
TRIGLYCERIDES: 169 mg/dL — AB (ref <150)
Total CHOL/HDL Ratio: 5.1 Ratio — ABNORMAL HIGH (ref <5.0)
VLDL: 34 mg/dL — AB (ref <30)

## 2016-03-02 ENCOUNTER — Encounter: Payer: Self-pay | Admitting: Family Medicine

## 2016-03-02 DIAGNOSIS — R748 Abnormal levels of other serum enzymes: Secondary | ICD-10-CM | POA: Insufficient documentation

## 2016-03-02 LAB — HEPATITIS C ANTIBODY: HCV AB: REACTIVE — AB

## 2016-03-02 LAB — PSA: PSA: 0.6 ng/mL (ref <=4.0)

## 2016-03-07 ENCOUNTER — Telehealth: Payer: Self-pay | Admitting: *Deleted

## 2016-03-07 DIAGNOSIS — R768 Other specified abnormal immunological findings in serum: Secondary | ICD-10-CM

## 2016-03-07 DIAGNOSIS — R899 Unspecified abnormal finding in specimens from other organs, systems and tissues: Secondary | ICD-10-CM

## 2016-03-07 DIAGNOSIS — R945 Abnormal results of liver function studies: Secondary | ICD-10-CM

## 2016-03-07 DIAGNOSIS — R748 Abnormal levels of other serum enzymes: Secondary | ICD-10-CM

## 2016-03-07 LAB — HEPATITIS C RNA QUANTITATIVE

## 2016-03-07 NOTE — Telephone Encounter (Signed)
Order Hep C and ask patient to come back in at his convenience to have it drawn.-jh

## 2016-03-07 NOTE — Telephone Encounter (Signed)
[facility] called to report there was not enough of specimen left for [facility] Hep C .

## 2016-05-10 DIAGNOSIS — F438 Other reactions to severe stress: Secondary | ICD-10-CM | POA: Diagnosis not present

## 2016-06-10 ENCOUNTER — Other Ambulatory Visit: Payer: Self-pay | Admitting: Family Medicine

## 2016-06-10 DIAGNOSIS — I10 Essential (primary) hypertension: Secondary | ICD-10-CM

## 2016-06-20 ENCOUNTER — Ambulatory Visit: Payer: Medicare Other | Admitting: Family Medicine

## 2016-07-07 ENCOUNTER — Ambulatory Visit: Payer: Medicare Other | Admitting: Family Medicine

## 2016-07-12 DIAGNOSIS — F438 Other reactions to severe stress: Secondary | ICD-10-CM | POA: Diagnosis not present

## 2016-07-13 ENCOUNTER — Other Ambulatory Visit: Payer: Self-pay | Admitting: Family Medicine

## 2016-07-13 MED ORDER — OMEPRAZOLE 20 MG PO CPDR: DELAYED_RELEASE_CAPSULE | ORAL | 6 refills | Status: DC

## 2016-07-14 ENCOUNTER — Other Ambulatory Visit: Payer: Self-pay | Admitting: Family Medicine

## 2016-07-14 DIAGNOSIS — I10 Essential (primary) hypertension: Secondary | ICD-10-CM

## 2016-10-02 ENCOUNTER — Ambulatory Visit: Payer: Medicare Other | Admitting: Family Medicine

## 2016-10-13 DIAGNOSIS — F438 Other reactions to severe stress: Secondary | ICD-10-CM | POA: Diagnosis not present

## 2016-10-23 ENCOUNTER — Emergency Department (HOSPITAL_COMMUNITY): Payer: Medicare Other

## 2016-10-23 ENCOUNTER — Encounter (HOSPITAL_COMMUNITY): Payer: Self-pay

## 2016-10-23 ENCOUNTER — Emergency Department (HOSPITAL_COMMUNITY)
Admission: EM | Admit: 2016-10-23 | Discharge: 2016-10-24 | Disposition: A | Discharge status: Home / Self Care | Payer: Medicare Other | Attending: Emergency Medicine | Admitting: Emergency Medicine

## 2016-10-23 DIAGNOSIS — R10817 Generalized abdominal tenderness: Secondary | ICD-10-CM | POA: Insufficient documentation

## 2016-10-23 DIAGNOSIS — I10 Essential (primary) hypertension: Secondary | ICD-10-CM | POA: Diagnosis not present

## 2016-10-23 DIAGNOSIS — R531 Weakness: Secondary | ICD-10-CM | POA: Insufficient documentation

## 2016-10-23 DIAGNOSIS — K7030 Alcoholic cirrhosis of liver without ascites: Secondary | ICD-10-CM | POA: Diagnosis not present

## 2016-10-23 DIAGNOSIS — R110 Nausea: Secondary | ICD-10-CM | POA: Diagnosis not present

## 2016-10-23 DIAGNOSIS — R05 Cough: Secondary | ICD-10-CM | POA: Insufficient documentation

## 2016-10-23 DIAGNOSIS — Z79899 Other long term (current) drug therapy: Secondary | ICD-10-CM | POA: Diagnosis not present

## 2016-10-23 DIAGNOSIS — R0602 Shortness of breath: Secondary | ICD-10-CM | POA: Insufficient documentation

## 2016-10-23 DIAGNOSIS — R61 Generalized hyperhidrosis: Secondary | ICD-10-CM | POA: Diagnosis not present

## 2016-10-23 DIAGNOSIS — F17210 Nicotine dependence, cigarettes, uncomplicated: Secondary | ICD-10-CM | POA: Insufficient documentation

## 2016-10-23 DIAGNOSIS — K5730 Diverticulosis of large intestine without perforation or abscess without bleeding: Secondary | ICD-10-CM | POA: Diagnosis not present

## 2016-10-23 DIAGNOSIS — R079 Chest pain, unspecified: Secondary | ICD-10-CM | POA: Diagnosis not present

## 2016-10-23 DIAGNOSIS — R42 Dizziness and giddiness: Secondary | ICD-10-CM | POA: Diagnosis not present

## 2016-10-23 DIAGNOSIS — R600 Localized edema: Secondary | ICD-10-CM | POA: Insufficient documentation

## 2016-10-23 DIAGNOSIS — R5383 Other fatigue: Secondary | ICD-10-CM | POA: Insufficient documentation

## 2016-10-23 DIAGNOSIS — R0789 Other chest pain: Secondary | ICD-10-CM | POA: Diagnosis not present

## 2016-10-23 MED ORDER — NITROGLYCERIN 0.4 MG SL SUBL
0.4000 mg | SUBLINGUAL_TABLET | SUBLINGUAL | Status: DC | PRN
  Administered 2016-10-24: 0.4 mg via SUBLINGUAL
  Filled 2016-10-23: qty 1

## 2016-10-23 MED ORDER — ASPIRIN 81 MG PO CHEW
324.0000 mg | CHEWABLE_TABLET | Freq: Once | ORAL | Status: AC
  Administered 2016-10-24: 324 mg via ORAL
  Filled 2016-10-23: qty 4

## 2016-10-23 MED ORDER — SODIUM CHLORIDE 0.9 % IV BOLUS (SEPSIS)
1000.0000 mL | Freq: Once | INTRAVENOUS | Status: AC
  Administered 2016-10-24: 1000 mL via INTRAVENOUS

## 2016-10-23 NOTE — ED Triage Notes (Signed)
Reports of sudden onset of left sided chest pain and shortness of breath while sitting on cough.

## 2016-10-23 NOTE — ED Provider Notes (Signed)
[facility] DEPT Provider Note   CSN: [id number] Arrival date & time: 10/23/16  2241     History   Chief Complaint Chief Complaint  Patient presents with  . Chest Pain  . Shortness of Breath    HPI Christian Sanders is a 56 y.o. male.  HPI  56 year old male with a history of hypertension on multiple medicines as well as smoking presents with chest pain starting around 3 PM. He states it was sudden onset while at rest sitting on the couch. Feels like a sharp stabbing left-sided chest pain. Does not radiate anywhere. No back pain or abdominal pain. He feels short of breath. The pain originally last about 20 minutes and was severe. Since then it has been coming and going. No clear cause for what makes it come and go. He has felt diffusely weak and dizzy when standing. He was unable to walk without assistance from his significant other due to weakness. Felt nauseated, did not vomit. Broke out into a sweat when this first started. Pain has not been as bad as initial but still 5/10. Took goody powder earlier with temporary relief. Has noticed bilateral ankle swelling recently. Started developing a cough after the pain started.  He last took his sildenafil 2 nights ago.   Past Medical History:  Diagnosis Date  . Anxiety   . Bipolar disorder (HCC)   . Dentures complicating chewing    full upper, partial lower  . Depression   . GERD (gastroesophageal reflux disease)   . Headache    s/p facial injury and reconstruction  . Hypertension   . PTSD (post-traumatic stress disorder)   . Vertebral column disorder    2 crushed thoracic vertebra    Patient Active Problem List   Diagnosis Date Noted  . Elevated liver enzymes 03/02/2016  . BPH without urinary obstruction 02/28/2016  . ED (erectile dysfunction) 12/28/2015  . SK (seborrheic keratosis) 12/28/2015  . Chronic headaches 02/09/2015  . Tobacco abuse 02/09/2015  . Hypertension 11/23/2014  . Bipolar disorder (HCC) 11/23/2014  . PTSD  (post-traumatic stress disorder) 11/23/2014  . Special screening for malignant neoplasms, colon   . Chronic mid back pain 12/16/2013  . Closed compression fracture of thoracic vertebra (HCC) 09/05/2013    Past Surgical History:  Procedure Laterality Date  . COLONOSCOPY WITH PROPOFOL N/A 10/09/2014   Procedure: COLONOSCOPY WITH PROPOFOL;  Surgeon: Midge Minium, MD;  Location: [facility];  Service: Endoscopy;  Laterality: N/A;  . FACIAL RECONSTRUCTION SURGERY     eye socket with 2 metal plates       Home Medications    Prior to Admission medications   Medication Sig Start Date End Date Taking? Authorizing Provider  ALPRAZolam Prudy Feeler) 0.5 MG tablet Take 0.5 mg by mouth daily as needed. 12/15/15   [provider]  amLODipine (NORVASC) 10 MG tablet TAKE 1 TABLET BY MOUTH ONCE DAILY 07/14/16   Althea Charon, Netta Neat, DO  Aspirin-Acetaminophen-Caffeine (GOODY HEADACHE PO) Take by mouth daily as needed.    [provider]  LATUDA 40 MG TABS tablet Take 40 mg by mouth daily. 02/04/16   [provider]  lisinopril (PRINIVIL,ZESTRIL) 40 MG tablet TAKE 1 TABLET BY MOUTH ONCE EVERY MORNING 06/12/16   Janeann Forehand., MD  loratadine (CLARITIN) 10 MG tablet Take 1 tablet (10 mg total) by mouth daily. 07/22/15   Janeann Forehand., MD  metoprolol (LOPRESSOR) 50 MG tablet TAKE 1 TABLET BY MOUTH TWICE DAILY. 06/12/16   Juanetta Gosling,  Teresita Madura., MD  omeprazole (PRILOSEC) 20 MG capsule TAKE 1 CAPSULE BY MOUTH TWICE DAILY AS NEEDED. 07/13/16   Smitty Cords, DO  sildenafil (REVATIO) 20 MG tablet TAKE 1 TO 5 TABLETS BY MOUTH ONCE DAILY AS NEEDED 02/28/16   Janeann Forehand., MD    Family History Family History  Problem Relation Age of Onset  . Family history unknown: Yes    Social History Social History  Substance Use Topics  . Smoking status: Current Every Day Smoker    Packs/day: 0.25    Years: 30.00    Types: Cigarettes  . Smokeless tobacco:  Never Used  . Alcohol use 3.6 oz/week    6 Cans of beer per week     Comment: daily     Allergies   Bee venom; Pineapple; and Pollen extract   Review of Systems Review of Systems  Constitutional: Positive for diaphoresis and fatigue. Negative for fever.  Respiratory: Positive for cough and shortness of breath.   Cardiovascular: Positive for chest pain and leg swelling (ankles).  Gastrointestinal: Positive for nausea. Negative for abdominal pain and vomiting.  Musculoskeletal: Negative for back pain.  Neurological: Positive for dizziness.  All other systems reviewed and are negative.    Physical Exam Updated Vital Signs BP (!) 145/88   Pulse 85   Temp 97.8 F (36.6 C) (Oral)   Resp 18   Ht 6\' 2"  (1.88 m)   Wt 109.8 kg (242 lb)   SpO2 97%   BMI 31.07 kg/m   Physical Exam  Constitutional: He is oriented to person, place, and time. He appears well-developed and well-nourished.  HENT:  Head: Normocephalic and atraumatic.  Right Ear: External ear normal.  Left Ear: External ear normal.  Nose: Nose normal.  Eyes: Right eye exhibits no discharge. Left eye exhibits no discharge.  Neck: Neck supple.  Cardiovascular: Normal rate, regular rhythm and normal heart sounds.   Pulses:      Radial pulses are 2+ on the right side, and 2+ on the left side.  Pulmonary/Chest: Effort normal and breath sounds normal. He exhibits no tenderness.  Abdominal: Soft. There is generalized tenderness (mild) and tenderness in the epigastric area.  Musculoskeletal: He exhibits no edema.  Neurological: He is alert and oriented to person, place, and time.  Skin: Skin is warm and dry. He is not diaphoretic.  Nursing note and vitals reviewed.    ED Treatments / Results  Labs (all labs ordered are listed, but only abnormal results are displayed) Labs Reviewed  D-DIMER, QUANTITATIVE (NOT AT [facility]) - Abnormal; Notable for the following:       Result Value   D-Dimer, Quant 1.74 (*)    All other  components within normal limits  CBC WITH DIFFERENTIAL/PLATELET - Abnormal; Notable for the following:    WBC 11.3 (*)    RBC 3.43 (*)    HCT 36.4 (*)    MCV 106.1 (*)    MCH 38.2 (*)    Platelets 124 (*)    All other components within normal limits  COMPREHENSIVE METABOLIC PANEL - Abnormal; Notable for the following:    Glucose, Bld 113 (*)    BUN <5 (*)    Calcium 8.8 (*)    Total Protein 8.4 (*)    Albumin 2.8 (*)    AST 181 (*)    ALT 75 (*)    Alkaline Phosphatase 141 (*)    Total Bilirubin 4.2 (*)    All other  components within normal limits  LIPASE, BLOOD - Abnormal; Notable for the following:    Lipase 58 (*)    All other components within normal limits  TROPONIN I  TROPONIN I    EKG  EKG Interpretation  Date/Time:  Monday October 23 2016 22:49:14 EDT Ventricular Rate:  95 PR Interval:  152 QRS Duration: 100 QT Interval:  390 QTC Calculation: 490 R Axis:   -19 Text Interpretation:  Normal sinus rhythm Cannot rule out Anterior infarct , age undetermined Abnormal ECG No old tracing to compare Confirmed by Pricilla Loveless [phone]) on 10/23/2016 10:53:03 PM       EKG Interpretation  Date/Time:  Tuesday October 24 2016 04:17:07 EDT Ventricular Rate:  91 PR Interval:  152 QRS Duration: 105 QT Interval:  391 QTC Calculation: 482 R Axis:   -29 Text Interpretation:  Sinus rhythm Borderline left axis deviation Anteroseptal infarct, old Baseline wander in lead(s) V4 V5 no significant change comapred to yesterday Confirmed by Pricilla Loveless [phone]) on 10/24/2016 4:27:19 AM        Radiology Dg Chest 2 View  Result Date: 10/24/2016 CLINICAL DATA:  Right-sided chest pain and dyspnea since yesterday. Three episodes tonight. EXAM: CHEST  2 VIEW COMPARISON:  None. FINDINGS: Chronic appearing interstitial coarsening. No airspace consolidation. No pleural effusion. Normal heart size. Normal pulmonary vasculature. Unremarkable hilar and mediastinal contours. IMPRESSION: No active  cardiopulmonary disease. Electronically Signed   By: Ellery Plunk M.D.   On: 10/24/2016 01:08   Ct Angio Chest Pe W/cm &/or Wo Cm  Result Date: 10/24/2016 CLINICAL DATA:  Sudden onset of left-sided chest pain. Abdominal pain. EXAM: CT ANGIOGRAPHY CHEST CT ABDOMEN AND PELVIS WITH CONTRAST TECHNIQUE: Multidetector CT imaging of the chest was performed using the standard protocol during bolus administration of intravenous contrast. Multiplanar CT image reconstructions and MIPs were obtained to evaluate the vascular anatomy. Multidetector CT imaging of the abdomen and pelvis was performed using the standard protocol during bolus administration of intravenous contrast. CONTRAST:  100 cc Isovue 370 IV COMPARISON:  Chest radiograph earlier this day. FINDINGS: CTA CHEST FINDINGS Cardiovascular: There are no filling defects within the pulmonary arteries to suggest pulmonary embolus to the segmental level. Cannot assess subsegmental branches due to contrast bolus timing. Thoracic aorta is normal in caliber. The heart is normal in size. No pericardial effusion. Mediastinum/Nodes: Prominent right lower paratracheal node measures 13 mm, nonspecific. No other mediastinal or hilar adenopathy. Visualized thyroid gland is normal. The esophagus is decompressed. Lungs/Pleura: Hypoventilatory atelectasis at the lung bases. Scattered linear atelectasis. No confluent consolidation. No evidence pulmonary edema. No pleural fluid. Musculoskeletal: Moderate to severe T7 compression fracture. Milder loss of height of T8-10 to a lesser extent T9. There are no acute or suspicious osseous abnormalities. Review of the MIP images confirms the above findings. CT ABDOMEN and PELVIS FINDINGS Hepatobiliary: Liver appears prominent size with caudate and left lower hypertrophy. There are nodular hepatic contours. No focal hepatic lesion. Gallbladder is distended with a Phrygian cap. Gallbladder wall thickening versus small amount  pericholecystic fluid. No calcified gallstones. No biliary dilatation. Pancreas: No ductal dilatation or inflammation. Spleen: Splenomegaly, spleen measures 17.6 x 11.7 x 7.1 cm (volume = 770 cm^3). Splenule inferiorly. Adrenals/Urinary Tract: No adrenal nodule. No hydronephrosis or perinephric edema. Homogeneous renal enhancement and symmetric excretion on delayed phase imaging. Urinary bladder is physiologically distended, no bladder wall thickening. Stomach/Bowel: Small hiatal hernia. Suggestion of mild gastric wall thickening about the greater curvature. No bowel obstruction. Moderate colonic diverticulosis  of the distal descending and sigmoid colon without acute inflammation. Lesser diverticulosis elsewhere throughout the colon. Normal appendix. Vascular/Lymphatic: Prominent periportal node measures 14 mm. Additional smaller upper abdominal nodes in the periportal region and celiac axis. Mild aortic atherosclerosis without aneurysm. There is no pelvic adenopathy. Reproductive: Central prostatic calcifications. Other: No ascites. No free air or intra-abdominal abscess. Small fat containing umbilical hernia. Musculoskeletal: Mild compression fracture superior endplate of L5 and to a lesser extent L3. There are no acute or suspicious osseous abnormalities. Review of the MIP images confirms the above findings. IMPRESSION: 1. No pulmonary embolus. Mild hypoventilatory change in the lungs, otherwise no acute intrathoracic abnormality. 2. Hepatic morphology is suggesting cirrhosis. Splenomegaly consistent with portal hypertension. No ascites. 3. Gallbladder distention with wall thickening or small amount pericholecystic fluid. This can be seen in the setting of chronic liver disease. No calcified gallstone. 4. Multifocal colonic diverticulosis without acute inflammation. 5. Possible gastric wall thickening versus nondistention. 6. Multiple compression fractures in the thoracic and lumbar spine. Aortic Atherosclerosis  (ICD10-I70.0). Electronically Signed   By: Rubye Oaks M.D.   On: 10/24/2016 02:23   Ct Abdomen Pelvis W Contrast  Result Date: 10/24/2016 CLINICAL DATA:  Sudden onset of left-sided chest pain. Abdominal pain. EXAM: CT ANGIOGRAPHY CHEST CT ABDOMEN AND PELVIS WITH CONTRAST TECHNIQUE: Multidetector CT imaging of the chest was performed using the standard protocol during bolus administration of intravenous contrast. Multiplanar CT image reconstructions and MIPs were obtained to evaluate the vascular anatomy. Multidetector CT imaging of the abdomen and pelvis was performed using the standard protocol during bolus administration of intravenous contrast. CONTRAST:  100 cc Isovue 370 IV COMPARISON:  Chest radiograph earlier this day. FINDINGS: CTA CHEST FINDINGS Cardiovascular: There are no filling defects within the pulmonary arteries to suggest pulmonary embolus to the segmental level. Cannot assess subsegmental branches due to contrast bolus timing. Thoracic aorta is normal in caliber. The heart is normal in size. No pericardial effusion. Mediastinum/Nodes: Prominent right lower paratracheal node measures 13 mm, nonspecific. No other mediastinal or hilar adenopathy. Visualized thyroid gland is normal. The esophagus is decompressed. Lungs/Pleura: Hypoventilatory atelectasis at the lung bases. Scattered linear atelectasis. No confluent consolidation. No evidence pulmonary edema. No pleural fluid. Musculoskeletal: Moderate to severe T7 compression fracture. Milder loss of height of T8-10 to a lesser extent T9. There are no acute or suspicious osseous abnormalities. Review of the MIP images confirms the above findings. CT ABDOMEN and PELVIS FINDINGS Hepatobiliary: Liver appears prominent size with caudate and left lower hypertrophy. There are nodular hepatic contours. No focal hepatic lesion. Gallbladder is distended with a Phrygian cap. Gallbladder wall thickening versus small amount pericholecystic fluid. No  calcified gallstones. No biliary dilatation. Pancreas: No ductal dilatation or inflammation. Spleen: Splenomegaly, spleen measures 17.6 x 11.7 x 7.1 cm (volume = 770 cm^3). Splenule inferiorly. Adrenals/Urinary Tract: No adrenal nodule. No hydronephrosis or perinephric edema. Homogeneous renal enhancement and symmetric excretion on delayed phase imaging. Urinary bladder is physiologically distended, no bladder wall thickening. Stomach/Bowel: Small hiatal hernia. Suggestion of mild gastric wall thickening about the greater curvature. No bowel obstruction. Moderate colonic diverticulosis of the distal descending and sigmoid colon without acute inflammation. Lesser diverticulosis elsewhere throughout the colon. Normal appendix. Vascular/Lymphatic: Prominent periportal node measures 14 mm. Additional smaller upper abdominal nodes in the periportal region and celiac axis. Mild aortic atherosclerosis without aneurysm. There is no pelvic adenopathy. Reproductive: Central prostatic calcifications. Other: No ascites. No free air or intra-abdominal abscess. Small fat containing umbilical hernia. Musculoskeletal:  Mild compression fracture superior endplate of L5 and to a lesser extent L3. There are no acute or suspicious osseous abnormalities. Review of the MIP images confirms the above findings. IMPRESSION: 1. No pulmonary embolus. Mild hypoventilatory change in the lungs, otherwise no acute intrathoracic abnormality. 2. Hepatic morphology is suggesting cirrhosis. Splenomegaly consistent with portal hypertension. No ascites. 3. Gallbladder distention with wall thickening or small amount pericholecystic fluid. This can be seen in the setting of chronic liver disease. No calcified gallstone. 4. Multifocal colonic diverticulosis without acute inflammation. 5. Possible gastric wall thickening versus nondistention. 6. Multiple compression fractures in the thoracic and lumbar spine. Aortic Atherosclerosis (ICD10-I70.0).  Electronically Signed   By: Rubye OaksMelanie  Ehinger M.D.   On: 10/24/2016 02:23    Procedures Procedures (including critical care time)  Medications Ordered in ED Medications  nitroGLYCERIN (NITROSTAT) SL tablet 0.4 mg (0.4 mg Sublingual Given 10/24/16 0001)  aspirin chewable tablet 324 mg (324 mg Oral Given 10/24/16 0001)  sodium chloride 0.9 % bolus 1,000 mL (0 mLs Intravenous Stopped 10/24/16 0140)  fentaNYL (SUBLIMAZE) injection 100 mcg (100 mcg Intravenous Given 10/24/16 0121)  iopamidol (ISOVUE-370) 76 % injection 100 mL (100 mLs Intravenous Contrast Given 10/24/16 0138)     Initial Impression / Assessment and Plan / ED Course  I have reviewed the triage vital signs and the nursing notes.  Pertinent labs & imaging results that were available during my care of the patient were reviewed by me and considered in my medical decision making (see chart for details).     Patient's chest pain is atypical. It seems to have resolved although not with nitroglycerin. He has 2 negative troponins and a unchanging ECG. He has some risk factors with his smoking and hypertension but his HEART score is a 3. Given somewhat pleuritic and sharp pain, d-dimer sent for otherwise low risk PE. This is positive. Given his nonspecific abdominal pain, LFTs and lipase were obtained. This shows some abnormalities as above. Thus CT chest abdomen pelvis was obtained. No PE or acute chest pathology. CT of the abdomen shows what is likely new onset cirrhosis. Further discussion with patient shows that he has had significant alcohol abuse in the past and used to drink a case a day. He is down to 1 or 2 drinks per day now. I have counseled him on trying to stop if possible. No Tylenol use. His bilirubin is up a little bit. I discussed his labs with Dr. Darrick PennaFields of gastroenterology. His could all be consistent with cirrhosis. He has some per cholecystic fluid but this could be seen with cirrhosis. No right upper quadrant pain on exam. On  final exam, he has no pain altogether at all. No abdominal tenderness. I highly doubt cholecystitis. Plan to discharge home with outpatient follow-up for his chest pain with his PCP and follow-up with GI for the cirrhosis. No ascites. Discussed strict return precautions.  Final Clinical Impressions(s) / ED Diagnoses   Final diagnoses:  Atypical chest pain  Alcoholic cirrhosis of liver without ascites (HCC)    New Prescriptions New Prescriptions   No medications on file     Pricilla LovelessGoldston, Marquel Pottenger, MD 10/24/16 [phone]

## 2016-10-24 ENCOUNTER — Emergency Department (HOSPITAL_COMMUNITY): Payer: Medicare Other

## 2016-10-24 DIAGNOSIS — K5730 Diverticulosis of large intestine without perforation or abscess without bleeding: Secondary | ICD-10-CM | POA: Diagnosis not present

## 2016-10-24 DIAGNOSIS — R079 Chest pain, unspecified: Secondary | ICD-10-CM | POA: Diagnosis not present

## 2016-10-24 LAB — CBC WITH DIFFERENTIAL/PLATELET
Basophils Absolute: 0.1 10*3/uL (ref 0.0–0.1)
Basophils Relative: 1 %
EOS ABS: 0.2 10*3/uL (ref 0.0–0.7)
Eosinophils Relative: 2 %
HCT: 36.4 % — ABNORMAL LOW (ref 39.0–52.0)
HEMOGLOBIN: 13.1 g/dL (ref 13.0–17.0)
LYMPHS ABS: 3.3 10*3/uL (ref 0.7–4.0)
Lymphocytes Relative: 29 %
MCH: 38.2 pg — AB (ref 26.0–34.0)
MCHC: 36 g/dL (ref 30.0–36.0)
MCV: 106.1 fL — ABNORMAL HIGH (ref 78.0–100.0)
MONO ABS: 0.9 10*3/uL (ref 0.1–1.0)
MONOS PCT: 8 %
NEUTROS PCT: 60 %
Neutro Abs: 6.8 10*3/uL (ref 1.7–7.7)
Platelets: 124 10*3/uL — ABNORMAL LOW (ref 150–400)
RBC: 3.43 MIL/uL — ABNORMAL LOW (ref 4.22–5.81)
RDW: 14.3 % (ref 11.5–15.5)
WBC: 11.3 10*3/uL — ABNORMAL HIGH (ref 4.0–10.5)

## 2016-10-24 LAB — D-DIMER, QUANTITATIVE: D-Dimer, Quant: 1.74 ug/mL-FEU — ABNORMAL HIGH (ref 0.00–0.50)

## 2016-10-24 LAB — COMPREHENSIVE METABOLIC PANEL
ALK PHOS: 141 U/L — AB (ref 38–126)
ALT: 75 U/L — ABNORMAL HIGH (ref 17–63)
ANION GAP: 10 (ref 5–15)
AST: 181 U/L — ABNORMAL HIGH (ref 15–41)
Albumin: 2.8 g/dL — ABNORMAL LOW (ref 3.5–5.0)
BUN: <5 mg/dL — ABNORMAL LOW (ref 6–20)
CALCIUM: 8.8 mg/dL — AB (ref 8.9–10.3)
CO2: 22 mmol/L (ref 22–32)
Chloride: 109 mmol/L (ref 101–111)
Creatinine, Ser: 0.84 mg/dL (ref 0.61–1.24)
GFR calc non Af Amer: >60 mL/min (ref >60)
Glucose, Bld: 113 mg/dL — ABNORMAL HIGH (ref 65–99)
Potassium: 3.7 mmol/L (ref 3.5–5.1)
SODIUM: 141 mmol/L (ref 135–145)
TOTAL PROTEIN: 8.4 g/dL — AB (ref 6.5–8.1)
Total Bilirubin: 4.2 mg/dL — ABNORMAL HIGH (ref 0.3–1.2)

## 2016-10-24 LAB — LIPASE, BLOOD: Lipase: 58 U/L — ABNORMAL HIGH (ref 11–51)

## 2016-10-24 LAB — TROPONIN I: Troponin I: <0.03 ng/mL (ref <0.03)

## 2016-10-24 MED ORDER — IOPAMIDOL (ISOVUE-370) INJECTION 76%
100.0000 mL | Freq: Once | INTRAVENOUS | Status: AC | PRN
  Administered 2016-10-24: 100 mL via INTRAVENOUS

## 2016-10-24 MED ORDER — FENTANYL CITRATE (PF) 100 MCG/2ML IJ SOLN
100.0000 ug | Freq: Once | INTRAMUSCULAR | Status: AC
  Administered 2016-10-24: 100 ug via INTRAVENOUS
  Filled 2016-10-24: qty 2

## 2016-10-24 NOTE — Discharge Instructions (Signed)
Your CT scan shows evidence of cirrhosis (damaged liver). Do not drink alcohol or use tylenol. See the GI specialist listed in the next 1-2 weeks. If you develop abdominal pain or swelling, return to the ER for evaluation Your Workup today did not show a heart attack. However you could still have heart disease, so following up with your doctor this week is very important. Please come back to the ER or call 911 for recurrent pain or new/concerning symptoms.

## 2016-11-06 ENCOUNTER — Encounter: Payer: Self-pay | Admitting: Gastroenterology

## 2016-11-15 DIAGNOSIS — F438 Other reactions to severe stress: Secondary | ICD-10-CM | POA: Diagnosis not present

## 2016-12-20 DIAGNOSIS — R932 Abnormal findings on diagnostic imaging of liver and biliary tract: Secondary | ICD-10-CM

## 2016-12-20 HISTORY — DX: Abnormal findings on diagnostic imaging of liver and biliary tract: R93.2

## 2016-12-27 ENCOUNTER — Encounter: Payer: Self-pay | Admitting: Nurse Practitioner

## 2016-12-27 ENCOUNTER — Ambulatory Visit (INDEPENDENT_AMBULATORY_CARE_PROVIDER_SITE_OTHER): Payer: Medicare HMO | Admitting: Nurse Practitioner

## 2016-12-27 ENCOUNTER — Other Ambulatory Visit: Payer: Self-pay

## 2016-12-27 DIAGNOSIS — R1011 Right upper quadrant pain: Secondary | ICD-10-CM

## 2016-12-27 DIAGNOSIS — K7030 Alcoholic cirrhosis of liver without ascites: Secondary | ICD-10-CM | POA: Diagnosis not present

## 2016-12-27 DIAGNOSIS — K7460 Unspecified cirrhosis of liver: Secondary | ICD-10-CM

## 2016-12-27 DIAGNOSIS — R69 Illness, unspecified: Secondary | ICD-10-CM | POA: Diagnosis not present

## 2016-12-27 DIAGNOSIS — K766 Portal hypertension: Secondary | ICD-10-CM | POA: Diagnosis not present

## 2016-12-27 NOTE — Assessment & Plan Note (Signed)
Noted right upper quadrant pain which radiates into his ribs. Possibly due to hepatomegaly versus question of mild cholecystitis/chronic cholecystitis on CT imaging. Right upper quadrant ultrasound is ordered to evaluate for his cirrhosis should showed some light on this picture. Return for follow-up in 2 months.

## 2016-12-27 NOTE — Progress Notes (Addendum)
REVIEWED-NO ADDITIONAL RECOMMENDATIONS.  Primary Care Physician:  Smitty Cords, DO Primary Gastroenterologist:  Dr. Darrick Penna  Chief Complaint  Patient presents with  . Cirrhosis  . Abdominal Pain    ruq and into back    HPI:   Christian Sanders is a 56 y.o. male who presents on referral from the emergency department for cirrhosis. He was seen in the emergency department on 10/23/2016 for atypical chest pain and alcoholic cirrhosis of the liver. He presented complaining of chest pain with sudden onset. Noted diffuse weakness and dizziness when standing. Nausea but no vomiting. Previously took [place] powder with temporary relief. Noted bilateral ankle swelling recently. Labs include CBC with white blood cell count of 11.3, platelet count of 124. CMP notes AST/ALT elevated at 181/75, alkaline phosphatase elevated at 141, bilirubin elevated at 4.2. His albumin is low at 2.8. Lipase was elevated at 58. CT of the abdomen and pelvis found liver with prominent size and nodular hepatic contours, no focal hepatic lesion. Noted splenomegaly. Overall hepatic morphology suggests cirrhosis and splenomegaly consistent with portal hypertension, no ascites. Some gallbladder distention and wall thickening which can be found in the setting of chronic liver disease. Diverticulosis without inflammation, possible gastric wall thickening versus nondistention. CT angiogram of the chest for PE found no pulmonary embolus. Troponins were negative.  The patient has a significant alcohol abuse history and "used to drink a case a day but now down to 1-2 drinks a day." Recommended alcohol cessation and no Tylenol use.  Today he states he's doing ok overall. Has right-sided pain which started 1.5 weeks ago, dull, constant and radiates into his ribs. Denies other abdominal pain, N/V, hematochezia, melena, fever, chills, unintentional weight loss. States he has been putting on weight. Denies yellowing of skin/eyes, darkened  urine. Has had episodes of acute confusion last happened about a week ago. Denies tremors. Has LE edema, no abdominal swelling. Has new dyspnea, has smoked for years. Denies chest pain, dizziness, lightheadedness, syncope, near syncope. Denies any other upper or lower GI symptoms.  Last ETOH use was last night.  Past Medical History:  Diagnosis Date  . Abnormal CT of liver 12/20/2016  . Anxiety   . Bipolar disorder (HCC)   . Dentures complicating chewing    full upper, partial lower  . Depression   . GERD (gastroesophageal reflux disease)   . Headache    s/p facial injury and reconstruction  . Hypertension   . PTSD (post-traumatic stress disorder)   . Vertebral column disorder    2 crushed thoracic vertebra    Past Surgical History:  Procedure Laterality Date  . COLONOSCOPY WITH PROPOFOL N/A 10/09/2014   Procedure: COLONOSCOPY WITH PROPOFOL;  Surgeon: Midge Minium, MD;  Location: [facility];  Service: Endoscopy;  Laterality: N/A;  . FACIAL RECONSTRUCTION SURGERY     eye socket with 2 metal plates    Current Outpatient Prescriptions  Medication Sig Dispense Refill  . ALPRAZolam (XANAX) 0.5 MG tablet Take 0.5 mg by mouth daily as needed.    Marland Kitchen amLODipine (NORVASC) 10 MG tablet TAKE 1 TABLET BY MOUTH ONCE DAILY (Patient taking differently: TAKE 1 TABLET BY MOUTH ONCE DAILY. Takes as needed) 30 tablet 11  . Aspirin-Acetaminophen-Caffeine (GOODY HEADACHE PO) Take by mouth daily as needed.    Marland Kitchen LATUDA 40 MG TABS tablet Take 40 mg by mouth daily.    Marland Kitchen lisinopril (PRINIVIL,ZESTRIL) 40 MG tablet TAKE 1 TABLET BY MOUTH ONCE EVERY MORNING 30 tablet 6  . omeprazole (  PRILOSEC) 20 MG capsule TAKE 1 CAPSULE BY MOUTH TWICE DAILY AS NEEDED. 60 capsule 6  . sildenafil (REVATIO) 20 MG tablet TAKE 1 TO 5 TABLETS BY MOUTH ONCE DAILY AS NEEDED 50 tablet 6   No current facility-administered medications for this visit.     Allergies as of 12/27/2016 - Review Complete 12/27/2016  Allergen  Reaction Noted  . Bee venom Swelling 12/28/2015  . Pineapple Nausea And Vomiting 10/06/2014  . Pollen extract Other (See Comments) 06/03/2015    Family History  Problem Relation Age of Onset  . Colon cancer Neg Hx   . Liver disease Neg Hx     Social History   Social History  . Marital status: Married    Spouse name: N/A  . Number of children: N/A  . Years of education: N/A   Occupational History  . Not on file.   Social History Main Topics  . Smoking status: Current Every Day Smoker    Packs/day: 0.25    Years: 30.00    Types: Cigarettes  . Smokeless tobacco: Never Used  . Alcohol use 3.6 oz/week    6 Cans of beer per week     Comment: Currently drinks 2 beers a day (as of 12/27/16); previously 1-1.5 cases of beer a day in his 76s.  . Drug use: No  . Sexual activity: Not on file   Other Topics Concern  . Not on file   Social History Narrative  . No narrative on file    Review of Systems: Complete ROS negative except as per HPI.   Physical Exam: BP 131/82   Pulse 82   Temp 97.7 F (36.5 C) (Oral)   Ht  (1.88 m)   Wt 242 lb 6.4 oz (110 kg)   BMI 31.12 kg/m  General:   Alert and oriented. Pleasant and cooperative. Well-nourished and well-developed.  Head:  Normocephalic and atraumatic. Eyes:  Without icterus, sclera clear and conjunctiva pink.  Ears:  Normal auditory acuity. Cardiovascular:  S1, S2 present without murmurs appreciated. Extremities without clubbing or edema. Respiratory:  Clear to auscultation bilaterally. No wheezes, rales, or rhonchi. No distress.  Gastrointestinal:  +BS, rounded but soft, and non-distended. Mild RUQ TTP. Hepatomegaly noted 2 fingerbreadths below the right costal margin. No guarding or rebound. No masses appreciated.  Rectal:  Deferred  Musculoskalatal:  Symmetrical without gross deformities. Neurologic:  Alert and oriented x4;  grossly normal neurologically. Psych:  Alert and cooperative. Normal mood and  affect. Heme/Lymph/Immune: No excessive bruising noted.    12/27/2016 9:18 AM   Disclaimer: This note was dictated with voice recognition software. Similar sounding words can inadvertently be transcribed and may not be corrected upon review.

## 2016-12-27 NOTE — Assessment & Plan Note (Signed)
Splenomegaly and mildly depressed platelet count consistent with likely portal hypertension related to likely cirrhosis. At this point given his portal hypertension he should undergo variceal screening.  Proceed with EGD with possible banding on propofol/MAC with Dr. Oneida Alar in near future: the risks, benefits, and alternatives have been discussed with the patient in detail. The patient states understanding and desires to proceed.  The patient is currently on Xanax. No other anticoagulants, anxiolytics, chronic pain medications, or antidepressants. He does have a history of significant alcohol abuse. I will plan for the procedure on propofol/MAC to promote adequate sedation.

## 2016-12-27 NOTE — Assessment & Plan Note (Signed)
Patient with CT imaging to suggest new diagnosis of cirrhosis. He has a significant history of alcohol abuse and previously was drinking 1-1-1/2 cases of beer a day. He was recommended by the ER physician to stop drinking all alcohol. He is currently drinking about 2 beers a day. I have again reinforced complete alcohol cessation. We discussed the clinical progression and other aspects of pathophysiology related to cirrhosis. He has a friend who died of alcoholic cirrhosis and is familiar with the "and picture." At this point I will check his labs including CBC, CMP, PT/INR, AFP. I will also check viral hepatitis labs. Return for follow-up in 2 months. May consider autoimmune serologies pending results.

## 2016-12-27 NOTE — Patient Instructions (Signed)
1. We will help you schedule your ultrasound of your liver. 2. Have your labs drawn when you're able to. 3. We will schedule your upper endoscopy to check for swollen blood vessels. 4. As we discussed, it is very important to stop drinking all alcohol. There are resources available if he should need to to help. 5. Return for follow-up in 2 months. 6. Call if you have any questions or concerns.

## 2016-12-28 ENCOUNTER — Telehealth: Payer: Self-pay | Admitting: Nurse Practitioner

## 2016-12-28 NOTE — Telephone Encounter (Signed)
Please contact [facility] to ask about his HepC antibody test. It popped up as "canced due to no appropriate specimen received." I'm assuming they didn't draw the right tube. If that is the case, rather than just canceling the order, they should contact the patient to have him come back for another blood draw. Let me know if I need to do anything.

## 2016-12-28 NOTE — Telephone Encounter (Signed)
I called Terry at [facility]) and they do not have this pt listed. He has not had any blood drawn there. I called pt and he said he has not been to the lab yet, but will go tomorrow.

## 2016-12-29 DIAGNOSIS — R1011 Right upper quadrant pain: Secondary | ICD-10-CM | POA: Diagnosis not present

## 2016-12-29 DIAGNOSIS — K766 Portal hypertension: Secondary | ICD-10-CM | POA: Diagnosis not present

## 2016-12-29 DIAGNOSIS — R69 Illness, unspecified: Secondary | ICD-10-CM | POA: Diagnosis not present

## 2017-01-01 ENCOUNTER — Ambulatory Visit (HOSPITAL_COMMUNITY): Admission: RE | Admit: 2017-01-01 | Payer: Medicare HMO | Source: Ambulatory Visit

## 2017-01-02 LAB — COMPREHENSIVE METABOLIC PANEL
AG Ratio: 0.8 (calc) — ABNORMAL LOW (ref 1.0–2.5)
ALBUMIN MSPROF: 3.6 g/dL (ref 3.6–5.1)
ALT: 22 U/L (ref 9–46)
AST: 40 U/L — AB (ref 10–35)
Alkaline phosphatase (APISO): 95 U/L (ref 40–115)
BUN/Creatinine Ratio: 8 (calc) (ref 6–22)
BUN: 6 mg/dL — ABNORMAL LOW (ref 7–25)
CALCIUM: 8.8 mg/dL (ref 8.6–10.3)
CHLORIDE: 105 mmol/L (ref 98–110)
CO2: 24 mmol/L (ref 20–32)
Creat: 0.78 mg/dL (ref 0.70–1.33)
GLOBULIN: 4.8 g/dL — AB (ref 1.9–3.7)
GLUCOSE: 96 mg/dL (ref 65–99)
POTASSIUM: 3.9 mmol/L (ref 3.5–5.3)
Sodium: 137 mmol/L (ref 135–146)
Total Bilirubin: 3.6 mg/dL — ABNORMAL HIGH (ref 0.2–1.2)
Total Protein: 8.4 g/dL — ABNORMAL HIGH (ref 6.1–8.1)

## 2017-01-02 LAB — CBC WITH DIFFERENTIAL/PLATELET
BASOS PCT: 0.9 %
Basophils Absolute: 62 cells/uL (ref 0–200)
Eosinophils Absolute: 228 cells/uL (ref 15–500)
Eosinophils Relative: 3.3 %
HCT: 37.1 % — ABNORMAL LOW (ref 38.5–50.0)
Hemoglobin: 13.5 g/dL (ref 13.2–17.1)
Lymphs Abs: 2084 cells/uL (ref 850–3900)
MCH: 38.5 pg — ABNORMAL HIGH (ref 27.0–33.0)
MCHC: 36.4 g/dL — ABNORMAL HIGH (ref 32.0–36.0)
MCV: 105.7 fL — ABNORMAL HIGH (ref 80.0–100.0)
MONOS PCT: 9.8 %
MPV: 10.7 fL (ref 7.5–12.5)
Neutro Abs: 3850 cells/uL (ref 1500–7800)
Neutrophils Relative %: 55.8 %
PLATELETS: 107 10*3/uL — AB (ref 140–400)
RBC: 3.51 10*6/uL — AB (ref 4.20–5.80)
RDW: 11.6 % (ref 11.0–15.0)
TOTAL LYMPHOCYTE: 30.2 %
WBC mixed population: 676 cells/uL (ref 200–950)
WBC: 6.9 10*3/uL (ref 3.8–10.8)

## 2017-01-02 LAB — HEPATITIS B SURFACE ANTIGEN: HEP B S AG: NONREACTIVE

## 2017-01-02 LAB — HEPATITIS A ANTIBODY, TOTAL: HEPATITIS A AB,TOTAL: NONREACTIVE

## 2017-01-02 LAB — PROTIME-INR
INR: 1.4 — AB
PROTHROMBIN TIME: 15.1 s — AB (ref 9.0–11.5)

## 2017-01-02 LAB — AFP TUMOR MARKER: AFP TUMOR MARKER: 9.4 ng/mL — AB (ref <6.1)

## 2017-01-02 LAB — HEPATITIS B CORE ANTIBODY, TOTAL: Hep B Core Total Ab: NONREACTIVE

## 2017-01-02 LAB — HEPATITIS B SURFACE ANTIBODY,QUALITATIVE: Hep B S Ab: NONREACTIVE

## 2017-01-04 ENCOUNTER — Ambulatory Visit (HOSPITAL_COMMUNITY)
Admission: RE | Admit: 2017-01-04 | Discharge: 2017-01-04 | Disposition: A | Discharge status: Home / Self Care | Payer: Medicare HMO | Source: Ambulatory Visit | Attending: Nurse Practitioner | Admitting: Nurse Practitioner

## 2017-01-04 DIAGNOSIS — R1011 Right upper quadrant pain: Secondary | ICD-10-CM

## 2017-01-04 DIAGNOSIS — K7030 Alcoholic cirrhosis of liver without ascites: Secondary | ICD-10-CM

## 2017-01-04 DIAGNOSIS — K766 Portal hypertension: Secondary | ICD-10-CM

## 2017-01-04 NOTE — Telephone Encounter (Signed)
Noted  

## 2017-01-05 ENCOUNTER — Ambulatory Visit (HOSPITAL_COMMUNITY)
Admission: RE | Admit: 2017-01-05 | Discharge: 2017-01-05 | Disposition: A | Discharge status: Home / Self Care | Payer: Medicare HMO | Source: Ambulatory Visit | Attending: Nurse Practitioner | Admitting: Nurse Practitioner

## 2017-01-05 DIAGNOSIS — R69 Illness, unspecified: Secondary | ICD-10-CM | POA: Diagnosis not present

## 2017-01-05 DIAGNOSIS — K7030 Alcoholic cirrhosis of liver without ascites: Secondary | ICD-10-CM | POA: Diagnosis not present

## 2017-01-05 DIAGNOSIS — R1011 Right upper quadrant pain: Secondary | ICD-10-CM | POA: Diagnosis not present

## 2017-01-05 DIAGNOSIS — R161 Splenomegaly, not elsewhere classified: Secondary | ICD-10-CM | POA: Insufficient documentation

## 2017-01-05 DIAGNOSIS — K766 Portal hypertension: Secondary | ICD-10-CM | POA: Insufficient documentation

## 2017-01-05 DIAGNOSIS — K824 Cholesterolosis of gallbladder: Secondary | ICD-10-CM | POA: Diagnosis not present

## 2017-01-15 NOTE — Progress Notes (Signed)
LMOM to call.

## 2017-01-15 NOTE — Progress Notes (Signed)
PT is aware. OK to schedule the MRI but he will need something to relax him to do the MRI. He is willing to be vaccinated for Hep A and B.

## 2017-01-15 NOTE — Progress Notes (Signed)
PT is aware and will keep appt.

## 2017-01-16 NOTE — Patient Instructions (Signed)
Dorin Stooksbury  01/16/2017     @   Your procedure is scheduled on  01/23/2017 .  Report to [facility] at  845  A.M.  Call this number if you have problems the morning of surgery:  [phone]   Remember:  Do not eat food or drink liquids after midnight.  Take these medicines the morning of surgery with A SIP OF WATER  Xanax, amlodipine.   Do not wear jewelry, make-up or nail polish.  Do not wear lotions, powders, or perfumes, or deoderant.  Do not shave 48 hours prior to surgery.  Men may shave face and neck.  Do not bring valuables to the hospital.  [facility] is not responsible for any belongings or valuables.  Contacts, dentures or bridgework may not be worn into surgery.  Leave your suitcase in the car.  After surgery it may be brought to your room.  For patients admitted to the hospital, discharge time will be determined by your treatment team.  Patients discharged the day of surgery will not be allowed to drive home.   Name and phone number of your driver:   family Special instructions:  Follow the diet instructions given to you by Dr Evelina Dun office.  Please read over the following fact sheets that you were given. Anesthesia Post-op Instructions and Care and Recovery After Surgery       Esophagogastroduodenoscopy Esophagogastroduodenoscopy (EGD) is a procedure to examine the lining of the esophagus, stomach, and first part of the small intestine (duodenum). This procedure is done to check for problems such as inflammation, bleeding, ulcers, or growths. During this procedure, a long, flexible, lighted tube with a camera attached (endoscope) is inserted down the throat. Tell a health care provider about:  Any allergies you have.  All medicines you are taking, including vitamins, herbs, eye drops, creams, and over-the-counter medicines.  Any problems you or family members have had with anesthetic medicines.  Any blood disorders  you have.  Any surgeries you have had.  Any medical conditions you have.  Whether you are pregnant or may be pregnant. What are the risks? Generally, this is a safe procedure. However, problems may occur, including:  Infection.  Bleeding.  A tear (perforation) in the esophagus, stomach, or duodenum.  Trouble breathing.  Excessive sweating.  Spasms of the larynx.  A slowed heartbeat.  Low blood pressure.  What happens before the procedure?  Follow instructions from your health care provider about eating or drinking restrictions.  Ask your health care provider about: ? Changing or stopping your regular medicines. This is especially important if you are taking diabetes medicines or blood thinners. ? Taking medicines such as aspirin and ibuprofen. These medicines can thin your blood. Do not take these medicines before your procedure if your health care provider instructs you not to.  Plan to have someone take you home after the procedure.  If you wear dentures, be ready to remove them before the procedure. What happens during the procedure?  To reduce your risk of infection, your health care team will wash or sanitize their hands.  An IV tube will be put in a vein in your hand or arm. You will get medicines and fluids through this tube.  You will be given one or more of the following: ? A medicine to help you relax (sedative). ? A medicine to numb the area (local anesthetic). This medicine may be sprayed into your throat.  It will make you feel more comfortable and keep you from gagging or coughing during the procedure. ? A medicine for pain.  A mouth guard may be placed in your mouth to protect your teeth and to keep you from biting on the endoscope.  You will be asked to lie on your left side.  The endoscope will be lowered down your throat into your esophagus, stomach, and duodenum.  Air will be put into the endoscope. This will help your health care provider see  better.  The lining of your esophagus, stomach, and duodenum will be examined.  Your health care provider may: ? Take a tissue sample so it can be looked at in a lab (biopsy). ? Remove growths. ? Remove objects (foreign bodies) that are stuck. ? Treat any bleeding with medicines or other devices that stop tissue from bleeding. ? Widen (dilate) or stretch narrowed areas of your esophagus and stomach.  The endoscope will be taken out. The procedure may vary among health care providers and hospitals. What happens after the procedure?  Your blood pressure, heart rate, breathing rate, and blood oxygen level will be monitored often until the medicines you were given have worn off.  Do not eat or drink anything until the numbing medicine has worn off and your gag reflex has returned. This information is not intended to replace advice given to you by your health care provider. Make sure you discuss any questions you have with your health care provider. Document Released: 07/21/2004 Document Revised: 08/26/2015 Document Reviewed: 02/11/2015 Elsevier Interactive Patient Education  2018 [employer]. Esophagogastroduodenoscopy, Care After Refer to this sheet in the next few weeks. These instructions provide you with information about caring for yourself after your procedure. Your health care provider may also give you more specific instructions. Your treatment has been planned according to current medical practices, but problems sometimes occur. Call your health care provider if you have any problems or questions after your procedure. What can I expect after the procedure? After the procedure, it is common to have:  A sore throat.  Nausea.  Bloating.  Dizziness.  Fatigue.  Follow these instructions at home:  Do not eat or drink anything until the numbing medicine (local anesthetic) has worn off and your gag reflex has returned. You will know that the local anesthetic has worn off when you  can swallow comfortably.  Do not drive for 24 hours if you received a medicine to help you relax (sedative).  If your health care provider took a tissue sample for testing during the procedure, make sure to get your test results. This is your responsibility. Ask your health care provider or the department performing the test when your results will be ready.  Keep all follow-up visits as told by your health care provider. This is important. Contact a health care provider if:  You cannot stop coughing.  You are not urinating.  You are urinating less than usual. Get help right away if:  You have trouble swallowing.  You cannot eat or drink.  You have throat or chest pain that gets worse.  You are dizzy or light-headed.  You faint.  You have nausea or vomiting.  You have chills.  You have a fever.  You have severe abdominal pain.  You have black, tarry, or bloody stools. This information is not intended to replace advice given to you by your health care provider. Make sure you discuss any questions you have with your health care provider. Document  Released: 03/06/2012 Document Revised: 08/26/2015 Document Reviewed: 02/11/2015 Elsevier Interactive Patient Education  2018 [employer]  Monitored Anesthesia Care Anesthesia is a term that refers to techniques, procedures, and medicines that help a person stay safe and comfortable during a medical procedure. Monitored anesthesia care, or sedation, is one type of anesthesia. Your anesthesia specialist may recommend sedation if you will be having a procedure that does not require you to be unconscious, such as:  Cataract surgery.  A dental procedure.  A biopsy.  A colonoscopy.  During the procedure, you may receive a medicine to help you relax (sedative). There are three levels of sedation:  Mild sedation. At this level, you may feel awake and relaxed. You will be able to follow directions.  Moderate sedation. At this  level, you will be sleepy. You may not remember the procedure.  Deep sedation. At this level, you will be asleep. You will not remember the procedure.  The more medicine you are given, the deeper your level of sedation will be. Depending on how you respond to the procedure, the anesthesia specialist may change your level of sedation or the type of anesthesia to fit your needs. An anesthesia specialist will monitor you closely during the procedure. Let your health care provider know about:  Any allergies you have.  All medicines you are taking, including vitamins, herbs, eye drops, creams, and over-the-counter medicines.  Any use of steroids (by mouth or as a cream).  Any problems you or family members have had with sedatives and anesthetic medicines.  Any blood disorders you have.  Any surgeries you have had.  Any medical conditions you have, such as sleep apnea.  Whether you are pregnant or may be pregnant.  Any use of cigarettes, alcohol, or street drugs. What are the risks? Generally, this is a safe procedure. However, problems may occur, including:  Getting too much medicine (oversedation).  Nausea.  Allergic reaction to medicines.  Trouble breathing. If this happens, a breathing tube may be used to help with breathing. It will be removed when you are awake and breathing on your own.  Heart trouble.  Lung trouble.  Before the procedure Staying hydrated Follow instructions from your health care provider about hydration, which may include:  Up to 2 hours before the procedure - you may continue to drink clear liquids, such as water, clear fruit juice, black coffee, and plain tea.  Eating and drinking restrictions Follow instructions from your health care provider about eating and drinking, which may include:  8 hours before the procedure - stop eating heavy meals or foods such as meat, fried foods, or fatty foods.  6 hours before the procedure - stop eating light  meals or foods, such as toast or cereal.  6 hours before the procedure - stop drinking milk or drinks that contain milk.  2 hours before the procedure - stop drinking clear liquids.  Medicines Ask your health care provider about:  Changing or stopping your regular medicines. This is especially important if you are taking diabetes medicines or blood thinners.  Taking medicines such as aspirin and ibuprofen. These medicines can thin your blood. Do not take these medicines before your procedure if your health care provider instructs you not to.  Tests and exams  You will have a physical exam.  You may have blood tests done to show: ? How well your kidneys and liver are working. ? How well your blood can clot.  General instructions  Plan to have someone  take you home from the hospital or clinic.  If you will be going home right after the procedure, plan to have someone with you for 24 hours.  What happens during the procedure?  Your blood pressure, heart rate, breathing, level of pain and overall condition will be monitored.  An IV tube will be inserted into one of your veins.  Your anesthesia specialist will give you medicines as needed to keep you comfortable during the procedure. This may mean changing the level of sedation.  The procedure will be performed. After the procedure  Your blood pressure, heart rate, breathing rate, and blood oxygen level will be monitored until the medicines you were given have worn off.  Do not drive for 24 hours if you received a sedative.  You may: ? Feel sleepy, clumsy, or nauseous. ? Feel forgetful about what happened after the procedure. ? Have a sore throat if you had a breathing tube during the procedure. ? Vomit. This information is not intended to replace advice given to you by your health care provider. Make sure you discuss any questions you have with your health care provider. Document Released: 12/14/2004 Document Revised:  08/27/2015 Document Reviewed: 07/11/2015 Elsevier Interactive Patient Education  2018 [employer] Inc. Monitored Anesthesia Care, Care After These instructions provide you with information about caring for yourself after your procedure. Your health care provider may also give you more specific instructions. Your treatment has been planned according to current medical practices, but problems sometimes occur. Call your health care provider if you have any problems or questions after your procedure. What can I expect after the procedure? After your procedure, it is common to:  Feel sleepy for several hours.  Feel clumsy and have poor balance for several hours.  Feel forgetful about what happened after the procedure.  Have poor judgment for several hours.  Feel nauseous or vomit.  Have a sore throat if you had a breathing tube during the procedure.  Follow these instructions at home: For at least 24 hours after the procedure:   Do not: ? Participate in activities in which you could fall or become injured. ? Drive. ? Use heavy machinery. ? Drink alcohol. ? Take sleeping pills or medicines that cause drowsiness. ? Make important decisions or sign legal documents. ? Take care of children on your own.  Rest. Eating and drinking  Follow the diet that is recommended by your health care provider.  If you vomit, drink water, juice, or soup when you can drink without vomiting.  Make sure you have little or no nausea before eating solid foods. General instructions  Have a responsible adult stay with you until you are awake and alert.  Take over-the-counter and prescription medicines only as told by your health care provider.  If you smoke, do not smoke without supervision.  Keep all follow-up visits as told by your health care provider. This is important. Contact a health care provider if:  You keep feeling nauseous or you keep vomiting.  You feel light-headed.  You develop a  rash.  You have a fever. Get help right away if:  You have trouble breathing. This information is not intended to replace advice given to you by your health care provider. Make sure you discuss any questions you have with your health care provider. Document Released: 07/11/2015 Document Revised: 11/10/2015 Document Reviewed: 07/11/2015 Elsevier Interactive Patient Education  [employer]2018 [employer]vier Inc.

## 2017-01-18 ENCOUNTER — Other Ambulatory Visit: Payer: Self-pay

## 2017-01-18 ENCOUNTER — Encounter (HOSPITAL_COMMUNITY): Payer: Self-pay

## 2017-01-18 ENCOUNTER — Encounter (HOSPITAL_COMMUNITY)
Admission: RE | Admit: 2017-01-18 | Discharge: 2017-01-18 | Disposition: A | Discharge status: Home / Self Care | Payer: Medicare HMO | Source: Ambulatory Visit | Attending: Gastroenterology | Admitting: Gastroenterology

## 2017-01-18 DIAGNOSIS — R945 Abnormal results of liver function studies: Secondary | ICD-10-CM

## 2017-01-18 DIAGNOSIS — K7460 Unspecified cirrhosis of liver: Secondary | ICD-10-CM | POA: Insufficient documentation

## 2017-01-18 DIAGNOSIS — Z01818 Encounter for other preprocedural examination: Secondary | ICD-10-CM | POA: Diagnosis present

## 2017-01-18 DIAGNOSIS — I10 Essential (primary) hypertension: Secondary | ICD-10-CM | POA: Diagnosis not present

## 2017-01-18 DIAGNOSIS — Z79899 Other long term (current) drug therapy: Secondary | ICD-10-CM | POA: Insufficient documentation

## 2017-01-18 DIAGNOSIS — Z7982 Long term (current) use of aspirin: Secondary | ICD-10-CM | POA: Insufficient documentation

## 2017-01-18 LAB — BASIC METABOLIC PANEL
ANION GAP: 8 (ref 5–15)
BUN: 5 mg/dL — ABNORMAL LOW (ref 6–20)
CHLORIDE: 108 mmol/L (ref 101–111)
CO2: 21 mmol/L — AB (ref 22–32)
Calcium: 8.8 mg/dL — ABNORMAL LOW (ref 8.9–10.3)
Creatinine, Ser: 0.74 mg/dL (ref 0.61–1.24)
GFR calc non Af Amer: >60 mL/min (ref >60)
Glucose, Bld: 130 mg/dL — ABNORMAL HIGH (ref 65–99)
Potassium: 3.5 mmol/L (ref 3.5–5.1)
Sodium: 137 mmol/L (ref 135–145)

## 2017-01-18 LAB — CBC WITH DIFFERENTIAL/PLATELET
BASOS ABS: 0 10*3/uL (ref 0.0–0.1)
BASOS PCT: 1 %
Eosinophils Absolute: 0.2 10*3/uL (ref 0.0–0.7)
Eosinophils Relative: 3 %
HEMATOCRIT: 37.7 % — AB (ref 39.0–52.0)
HEMOGLOBIN: 13.2 g/dL (ref 13.0–17.0)
Lymphocytes Relative: 33 %
Lymphs Abs: 2.4 10*3/uL (ref 0.7–4.0)
MCH: 38.2 pg — ABNORMAL HIGH (ref 26.0–34.0)
MCHC: 35 g/dL (ref 30.0–36.0)
MCV: 109 fL — ABNORMAL HIGH (ref 78.0–100.0)
MONOS PCT: 11 %
Monocytes Absolute: 0.8 10*3/uL (ref 0.1–1.0)
NEUTROS ABS: 3.8 10*3/uL (ref 1.7–7.7)
NEUTROS PCT: 53 %
Platelets: 101 10*3/uL — ABNORMAL LOW (ref 150–400)
RBC: 3.46 MIL/uL — ABNORMAL LOW (ref 4.22–5.81)
RDW: 13 % (ref 11.5–15.5)
WBC: 7.2 10*3/uL (ref 4.0–10.5)

## 2017-01-18 LAB — SURGICAL PCR SCREEN
MRSA, PCR: NEGATIVE
STAPHYLOCOCCUS AUREUS: POSITIVE — AB

## 2017-01-19 NOTE — Progress Notes (Signed)
Dr Jayme CloudGonzalez notified of platelets 101. No orders given,

## 2017-01-23 ENCOUNTER — Ambulatory Visit (HOSPITAL_COMMUNITY): Admission: RE | Admit: 2017-01-23 | Payer: Medicare HMO | Source: Ambulatory Visit | Admitting: Gastroenterology

## 2017-01-23 ENCOUNTER — Encounter (HOSPITAL_COMMUNITY): Admission: RE | Payer: Self-pay | Source: Ambulatory Visit

## 2017-01-23 ENCOUNTER — Telehealth: Payer: Self-pay

## 2017-01-23 SURGERY — ESOPHAGOGASTRODUODENOSCOPY (EGD) WITH PROPOFOL
Anesthesia: Monitor Anesthesia Care

## 2017-01-23 NOTE — OR Nursing (Signed)
PATIENT CALLED AND CANCELLED PROCEDURE,  STATES THAT HE IS SICK AND WILL RESCHEDULE

## 2017-01-23 NOTE — Telephone Encounter (Signed)
Due to same endo/cancellation. PT NEED OPV PRIOR TO Tower OF EGD W/ MAC.

## 2017-01-23 NOTE — Telephone Encounter (Signed)
Endo scheduler called office. Pt's EGD/-/+varices banding w/Propofol was canceled for today d/t pt being sick.  Minerva Areolaric, does pt need to have OV to reschedule since he is being done with Propofol?

## 2017-01-23 NOTE — Op Note (Addendum)
[facility] Patient Name: Christian Sanders Procedure Date: 01/23/2017 10:58 AM MRN: [id number] Date of Birth: 07/17/1960 Attending MD: Jonette EvaSandi Vinicius Brockman MD, MD CSN: [id number] Age: 56 Admit Type:  THIS EXAM WAS SENT IN ERROR

## 2017-01-24 MED ORDER — MUPIROCIN 2 % EX OINT
TOPICAL_OINTMENT | CUTANEOUS | Status: AC
  Filled 2017-01-24: qty 22

## 2017-01-25 ENCOUNTER — Ambulatory Visit (HOSPITAL_COMMUNITY)
Admission: RE | Admit: 2017-01-25 | Discharge: 2017-01-25 | Disposition: A | Discharge status: Home / Self Care | Payer: Medicare HMO | Source: Ambulatory Visit | Attending: Nurse Practitioner | Admitting: Nurse Practitioner

## 2017-01-25 DIAGNOSIS — K7460 Unspecified cirrhosis of liver: Secondary | ICD-10-CM | POA: Diagnosis not present

## 2017-01-25 DIAGNOSIS — R161 Splenomegaly, not elsewhere classified: Secondary | ICD-10-CM | POA: Insufficient documentation

## 2017-01-25 DIAGNOSIS — R945 Abnormal results of liver function studies: Secondary | ICD-10-CM | POA: Diagnosis not present

## 2017-01-25 MED ORDER — GADOBENATE DIMEGLUMINE 529 MG/ML IV SOLN
20.0000 mL | Freq: Once | INTRAVENOUS | Status: AC | PRN
  Administered 2017-01-25: 20 mL via INTRAVENOUS

## 2017-01-25 NOTE — Telephone Encounter (Signed)
Noted  

## 2017-01-30 DIAGNOSIS — R69 Illness, unspecified: Secondary | ICD-10-CM | POA: Diagnosis not present

## 2017-02-02 ENCOUNTER — Telehealth: Payer: Self-pay

## 2017-02-02 NOTE — Telephone Encounter (Signed)
Ok per Minerva AreolaEric to separate injections for HEP C treatment. PT will receive an injection of Hep A & Hep B.

## 2017-02-02 NOTE — Telephone Encounter (Signed)
Christian Sanders called from [facility] to ask if it was ok to separate injections Hep A & B. They don't have the combination injection in the store. They can order it if needed.

## 2017-02-07 DIAGNOSIS — F438 Other reactions to severe stress: Secondary | ICD-10-CM | POA: Diagnosis not present

## 2017-02-07 DIAGNOSIS — R69 Illness, unspecified: Secondary | ICD-10-CM | POA: Diagnosis not present

## 2017-02-19 NOTE — Progress Notes (Signed)
PT is aware.

## 2017-03-08 ENCOUNTER — Ambulatory Visit (INDEPENDENT_AMBULATORY_CARE_PROVIDER_SITE_OTHER): Payer: Medicare HMO | Admitting: Nurse Practitioner

## 2017-03-08 ENCOUNTER — Encounter: Payer: Self-pay | Admitting: *Deleted

## 2017-03-08 ENCOUNTER — Encounter: Payer: Self-pay | Admitting: Nurse Practitioner

## 2017-03-08 ENCOUNTER — Other Ambulatory Visit: Payer: Self-pay | Admitting: *Deleted

## 2017-03-08 ENCOUNTER — Telehealth: Payer: Self-pay | Admitting: *Deleted

## 2017-03-08 VITALS — BP 140/88 | HR 95 | Temp 97.1°F | Ht 74.0 in | Wt 236.0 lb

## 2017-03-08 DIAGNOSIS — K766 Portal hypertension: Secondary | ICD-10-CM | POA: Diagnosis not present

## 2017-03-08 DIAGNOSIS — R161 Splenomegaly, not elsewhere classified: Secondary | ICD-10-CM | POA: Diagnosis not present

## 2017-03-08 DIAGNOSIS — K7030 Alcoholic cirrhosis of liver without ascites: Secondary | ICD-10-CM | POA: Diagnosis not present

## 2017-03-08 DIAGNOSIS — R69 Illness, unspecified: Secondary | ICD-10-CM | POA: Diagnosis not present

## 2017-03-08 NOTE — Assessment & Plan Note (Signed)
Noted alcoholic cirrhosis.  He was congratulated on cutting back on alcohol.  He previously drank a case a day, decreased to 1 or 2 a day, and is now drinking 1 or 2 on the weekends only has not had any alcohol in 3 days.  Discussed the disease progression if he continues to drink alcohol.  Discussed the inability to refer him to a transplant center if he is still drinking.  He verbalized understanding.  His best friend from childhood developed alcoholic cirrhosis and passed away from it so he is very well aware of how the disease progresses.  He will continue to try to cut out alcohol.  I have offered that he can contact us for any necessary resources.  His labs are up-to-date with a meld score of 15, child Pugh B.  The only lab not drawn was hepatitis C because the lab indicated that did not draw an appropriate amount of sample.  I will have him repeat this.  Repeat EGD as per below.  Return for follow-up in 3 months.  At that time he will be due for routine imaging and labs.

## 2017-03-08 NOTE — Progress Notes (Signed)
cc'ed to pcp °

## 2017-03-08 NOTE — Telephone Encounter (Signed)
LMOVM. Pt pre-op appt scheduled for 04/12/17 at 9:00am. Letter mailed to pt.

## 2017-03-08 NOTE — Assessment & Plan Note (Signed)
Patient with noted splenomegaly on abdominal imaging.  His spleen was palpable 3 fingerbreadths below the left costal margin on exam during inspiration.  Associated thrombocytopenia with his platelet count in the low 100s.  Continue to monitor, follow-up in 3 months.

## 2017-03-08 NOTE — Patient Instructions (Signed)
1. Have your blood test drawn when you are able to. 2. We will schedule your upper endoscopy with possible banding for you. 3. Return for follow-up in 3 months. 4. Call if you have any questions or concerns.

## 2017-03-08 NOTE — Assessment & Plan Note (Addendum)
Portal hypertension based on imaging with confirmed cirrhosis.  He became ill with a sore throat prior to his EGD that was previously scheduled.  We will reschedule his EGD today to evaluate for esophageal varices and plan for possible banding.  Return for follow-up in 3 months.  Proceed with EGD with Dr. Oneida Alar in near future: the risks, benefits, and alternatives have been discussed with the patient in detail. The patient states understanding and desires to proceed.  The patient is currently on Xanax.  No other anticoagulants, anxiolytics, chronic pain medications, or antidepressants.  Drinks at least weekly.  History of significant alcoholism.  Because of this we will plan for the procedure on propofol/MAC.

## 2017-03-08 NOTE — Progress Notes (Addendum)
REVIEWED-NO ADDITIONAL RECOMMENDATIONS.  Referring Provider: Saralyn Pilar * Primary Care Physician:  Smitty Cords, DO Primary GI:  Dr. Darrick Penna  Chief Complaint  Patient presents with  . Cirrhosis    HPI:   Christian Sanders is a 56 y.o. male who presents for follow-up on alcoholic cirrhosis.  The patient was last seen in our office 12/27/2016 for alcoholic cirrhosis, right upper quadrant pain, portal hypertension.  Referral from the emergency department for atypical chest pain and alcoholic cirrhosis.  In the emergency department that triggered his referral his platelet count was 124, AST/ALT elevated as well as alkaline phosphatase elevation and bilirubin elevated to 4.2.  His albumin was low at 2.8.  CT findings consistent with cirrhosis and portal hypertension with noted splenomegaly.  Alcohol use at his last visit was described as "used to drink a lot, about a case a day but now down to 1-2 drinks a day."  At his last visit he was doing okay, right upper quadrant pain dull and constant which started 1-1/2 weeks prior.  Noted weight gain.  Some episodes of acute confusion, noted complaint of lower extremity edema but no abdominal swelling.  No other GI or hepatic symptoms.  His last alcohol use was the night before.  Recommended ultrasound of the liver, routine labs, EGD, alcohol cessation, avoidance of hepatotoxic medications.  Also recommended EGD with possible banding.  The patient canceled his upper endoscopy due to being sick.  Recommended follow-up office visit to reschedule.  Labs completed 12/29/2016 found CBC with normal hemoglobin, low platelet count at 107.  CMP with normal creatinine, normal albumin at 3.6, normal sodium, mildly elevated AST at 40, normal ALT, normal alkaline phosphatase, elevated total bilirubin at 3.6.  AFP was elevated at 9.4.  INR elevated at 1.4.  Hepatitis B and and a were negative for immunization or infection.  Hepatitis C appears to have been  canceled by the lab.  MELD 15, Child Pugh B.  His ultrasound showed some F3 and F4 with a high risk of fibrosis, normal common bile duct, liver consistent with cirrhosis and no liver mass visualized.  Due to his elevated AFP despite no mass on ultrasound we completed an MRI of the liver.  Noted image degradation by motion artifact, demonstration of cirrhosis, no liver masses identified, no hepatic masses identified.  Mild wall thickening of the gallbladder unchanged and likely due to cirrhosis.  Splenomegaly noted with a length of 17 cm  Today he states he's doing well overall. Had a sore throat up to his EGD and cancelled due to this and unsure if he was contagious. Denies abdominal pain. Has N/V only if he takes Jordan. Decreased appetite (chronic for him). Denies early satiety. Has been having intermittent toilet tissue hematochezia with history of Hemorrhoids. Had last TCS 2 years ago and was cleared for 10 years repeat in [place], [place] [facility] Denies melena, fever, chills, unintentional weight loss. Denies yellowing of skin/eyes, darkened urine, acute episodic confusion, tremors, abdominal swelling. Occasional LE edema. Denies chest pain, dyspnea, dizziness, lightheadedness, syncope, near syncope. Denies any other upper or lower GI symptoms.  He has started with Hep A/B vaccination series. One complete, receiving the second on Saturday and then the thirs in a few months after that.  Past Medical History:  Diagnosis Date  . Abnormal CT of liver 12/20/2016  . Anxiety   . Bipolar disorder (HCC)   . Dentures complicating chewing    full upper, partial lower  .  Depression   . GERD (gastroesophageal reflux disease)   . Glaucoma   . Headache    s/p facial injury and reconstruction  . Hypertension   . PTSD (post-traumatic stress disorder)   . Vertebral column disorder    2 crushed thoracic vertebra    Past Surgical History:  Procedure Laterality Date  . COLONOSCOPY WITH  PROPOFOL N/A 10/09/2014   Procedure: COLONOSCOPY WITH PROPOFOL;  Surgeon: Midge Miniumarren Wohl, MD;  [facility];  Service: Endoscopy;  Laterality: N/A;  . FACIAL RECONSTRUCTION SURGERY     eye socket with 2 metal plates    Current Outpatient Medications  Medication Sig Dispense Refill  . ALPRAZolam (XANAX) 0.5 MG tablet Take 0.5 mg by mouth daily.     Marland Kitchen. amLODipine (NORVASC) 10 MG tablet TAKE 1 TABLET BY MOUTH ONCE DAILY (Patient taking differently: TAKE 1 TABLET BY MOUTH ONCE DAILY.) 30 tablet 11  . Aspirin-Acetaminophen-Caffeine (GOODY HEADACHE PO) Take 1 packet by mouth daily as needed (pain).     Marland Kitchen. LATUDA 40 MG TABS tablet Take 40 mg by mouth daily.    Marland Kitchen. lisinopril (PRINIVIL,ZESTRIL) 40 MG tablet TAKE 1 TABLET BY MOUTH ONCE EVERY MORNING 30 tablet 6  . omeprazole (PRILOSEC) 20 MG capsule TAKE 1 CAPSULE BY MOUTH TWICE DAILY AS NEEDED. 60 capsule 6  . sildenafil (REVATIO) 20 MG tablet TAKE 1 TO 5 TABLETS BY MOUTH ONCE DAILY AS NEEDED (Patient taking differently: Take 100 mg by mouth as needed (before intercourse). TAKE 1 TO 5 TABLETS BY MOUTH ONCE DAILY AS NEEDE) 50 tablet 6   No current facility-administered medications for this visit.     Allergies as of 03/08/2017 - Review Complete 03/08/2017  Allergen Reaction Noted  . Bee venom Swelling 12/28/2015  . Pineapple Nausea And Vomiting 10/06/2014  . Pollen extract Other (See Comments) 06/03/2015    Family History  Problem Relation Age of Onset  . Colon cancer Neg Hx   . Liver disease Neg Hx     Social History   Socioeconomic History  . Marital status: Single    Spouse name: None  . Number of children: None  . Years of education: None  . Highest education level: None  Social Needs  . Financial resource strain: None  . Food insecurity - worry: None  . Food insecurity - inability: None  . Transportation needs - medical: None  . Transportation needs - non-medical: None  Occupational History  . None  Tobacco Use  .  Smoking status: Current Every Day Smoker    Packs/day: 0.25    Years: 30.00    Pack years: 7.50    Types: Cigarettes  . Smokeless tobacco: Never Used  Substance and Sexual Activity  . Alcohol use: Yes    Alcohol/week: 3.6 oz    Types: 6 Cans of beer per week    Comment: None in 3 days (as of 03/08/17). Last visit: drinks 2 beers a day (as of 12/27/16); previously 1-1.5 cases of beer a day in his 8920s.  . Drug use: No  . Sexual activity: No    Birth control/protection: None  Other Topics Concern  . None  Social History Narrative  . None    Review of Systems: Complete ROS negative except as per HPI.  Physical Exam: BP 140/88   Pulse 95   Temp (!) 97.1 F (36.2 C) (Oral)   Ht 6\' 2"  (1.88 m)   Wt 236 lb (107 kg)   BMI 30.30 kg/m  General:  Alert and oriented. Pleasant and cooperative. Well-nourished and well-developed.  Eyes:  Without icterus, sclera clear and conjunctiva pink.  Ears:  Normal auditory acuity. Cardiovascular:  S1, S2 present without murmurs appreciated. Extremities without clubbing or edema. Respiratory:  Clear to auscultation bilaterally. No wheezes, rales, or rhonchi. No distress.  Gastrointestinal:  +BS, soft, non-tender and non-distended. Spleen noted 3 fingerbreadths below the left costal margin on inspiration; Liver border firm and 2 fingerbreadths below the right costal margin on inspiration. No guarding or rebound. No masses appreciated.  Rectal:  Deferred  Musculoskalatal:  Symmetrical without gross deformities. Neurologic:  Alert and oriented x4;  grossly normal neurologically. Psych:  Alert and cooperative. Normal mood and affect. Heme/Lymph/Immune: No excessive bruising noted.    03/08/2017 8:49 AM   Disclaimer: This note was dictated with voice recognition software. Similar sounding words can inadvertently be transcribed and may not be corrected upon review.

## 2017-03-12 ENCOUNTER — Ambulatory Visit: Payer: Medicare HMO | Admitting: Nurse Practitioner

## 2017-04-06 NOTE — Patient Instructions (Signed)
Christian Sanders  04/06/2017     @PREFPERIOPPHARMACY @   Your procedure is scheduled on  04/17/2017   Report to [facility] at  845  A.M.  Call this number if you have problems the morning of surgery:  [phone]   Remember:  Do not eat food or drink liquids after midnight.  Take these medicines the morning of surgery with A SIP OF WATER  Xanax.   Do not wear jewelry, make-up or nail polish.  Do not wear lotions, powders, or perfumes, or deodorant.  Do not shave 48 hours prior to surgery.  Men may shave face and neck.  Do not bring valuables to the hospital.  [facility] is not responsible for any belongings or valuables.  Contacts, dentures or bridgework may not be worn into surgery.  Leave your suitcase in the car.  After surgery it may be brought to your room.  For patients admitted to the hospital, discharge time will be determined by your treatment team.  Patients discharged the day of surgery will not be allowed to drive home.   Name and phone number of your driver:   family Special instructions:  Follow the diet instructions given to you by Dr Evelina DunField's office.  Please read over the following fact sheets that you were given. Anesthesia Post-op Instructions and Care and Recovery After Surgery       Esophagogastroduodenoscopy Esophagogastroduodenoscopy (EGD) is a procedure to examine the lining of the esophagus, stomach, and first part of the small intestine (duodenum). This procedure is done to check for problems such as inflammation, bleeding, ulcers, or growths. During this procedure, a long, flexible, lighted tube with a camera attached (endoscope) is inserted down the throat. Tell a health care provider about:  Any allergies you have.  All medicines you are taking, including vitamins, herbs, eye drops, creams, and over-the-counter medicines.  Any problems you or family members have had with anesthetic medicines.  Any blood disorders you have.  Any  surgeries you have had.  Any medical conditions you have.  Whether you are pregnant or may be pregnant. What are the risks? Generally, this is a safe procedure. However, problems may occur, including:  Infection.  Bleeding.  A tear (perforation) in the esophagus, stomach, or duodenum.  Trouble breathing.  Excessive sweating.  Spasms of the larynx.  A slowed heartbeat.  Low blood pressure.  What happens before the procedure?  Follow instructions from your health care provider about eating or drinking restrictions.  Ask your health care provider about: ? Changing or stopping your regular medicines. This is especially important if you are taking diabetes medicines or blood thinners. ? Taking medicines such as aspirin and ibuprofen. These medicines can thin your blood. Do not take these medicines before your procedure if your health care provider instructs you not to.  Plan to have someone take you home after the procedure.  If you wear dentures, be ready to remove them before the procedure. What happens during the procedure?  To reduce your risk of infection, your health care team will wash or sanitize their hands.  An IV tube will be put in a vein in your hand or arm. You will get medicines and fluids through this tube.  You will be given one or more of the following: ? A medicine to help you relax (sedative). ? A medicine to numb the area (local anesthetic). This medicine may be sprayed into your throat. It  will make you feel more comfortable and keep you from gagging or coughing during the procedure. ? A medicine for pain.  A mouth guard may be placed in your mouth to protect your teeth and to keep you from biting on the endoscope.  You will be asked to lie on your left side.  The endoscope will be lowered down your throat into your esophagus, stomach, and duodenum.  Air will be put into the endoscope. This will help your health care provider see better.  The  lining of your esophagus, stomach, and duodenum will be examined.  Your health care provider may: ? Take a tissue sample so it can be looked at in a lab (biopsy). ? Remove growths. ? Remove objects (foreign bodies) that are stuck. ? Treat any bleeding with medicines or other devices that stop tissue from bleeding. ? Widen (dilate) or stretch narrowed areas of your esophagus and stomach.  The endoscope will be taken out. The procedure may vary among health care providers and hospitals. What happens after the procedure?  Your blood pressure, heart rate, breathing rate, and blood oxygen level will be monitored often until the medicines you were given have worn off.  Do not eat or drink anything until the numbing medicine has worn off and your gag reflex has returned. This information is not intended to replace advice given to you by your health care provider. Make sure you discuss any questions you have with your health care provider. Document Released: 07/21/2004 Document Revised: 08/26/2015 Document Reviewed: 02/11/2015 Elsevier Interactive Patient Education  2018 [employer] Esophagogastroduodenoscopy, Care After Refer to this sheet in the next few weeks. These instructions provide you with information about caring for yourself after your procedure. Your health care provider may also give you more specific instructions. Your treatment has been planned according to current medical practices, but problems sometimes occur. Call your health care provider if you have any problems or questions after your procedure. What can I expect after the procedure? After the procedure, it is common to have:  A sore throat.  Nausea.  Bloating.  Dizziness.  Fatigue.  Follow these instructions at home:  Do not eat or drink anything until the numbing medicine (local anesthetic) has worn off and your gag reflex has returned. You will know that the local anesthetic has worn off when you can swallow  comfortably.  Do not drive for 24 hours if you received a medicine to help you relax (sedative).  If your health care provider took a tissue sample for testing during the procedure, make sure to get your test results. This is your responsibility. Ask your health care provider or the department performing the test when your results will be ready.  Keep all follow-up visits as told by your health care provider. This is important. Contact a health care provider if:  You cannot stop coughing.  You are not urinating.  You are urinating less than usual. Get help right away if:  You have trouble swallowing.  You cannot eat or drink.  You have throat or chest pain that gets worse.  You are dizzy or light-headed.  You faint.  You have nausea or vomiting.  You have chills.  You have a fever.  You have severe abdominal pain.  You have black, tarry, or bloody stools. This information is not intended to replace advice given to you by your health care provider. Make sure you discuss any questions you have with your health care provider. Document Released:  03/06/2012 Document Revised: 08/26/2015 Document Reviewed: 02/11/2015 Elsevier Interactive Patient Education  2018 [employer].  Monitored Anesthesia Care Anesthesia is a term that refers to techniques, procedures, and medicines that help a person stay safe and comfortable during a medical procedure. Monitored anesthesia care, or sedation, is one type of anesthesia. Your anesthesia specialist may recommend sedation if you will be having a procedure that does not require you to be unconscious, such as:  Cataract surgery.  A dental procedure.  A biopsy.  A colonoscopy.  During the procedure, you may receive a medicine to help you relax (sedative). There are three levels of sedation:  Mild sedation. At this level, you may feel awake and relaxed. You will be able to follow directions.  Moderate sedation. At this level, you will  be sleepy. You may not remember the procedure.  Deep sedation. At this level, you will be asleep. You will not remember the procedure.  The more medicine you are given, the deeper your level of sedation will be. Depending on how you respond to the procedure, the anesthesia specialist may change your level of sedation or the type of anesthesia to fit your needs. An anesthesia specialist will monitor you closely during the procedure. Let your health care provider know about:  Any allergies you have.  All medicines you are taking, including vitamins, herbs, eye drops, creams, and over-the-counter medicines.  Any use of steroids (by mouth or as a cream).  Any problems you or family members have had with sedatives and anesthetic medicines.  Any blood disorders you have.  Any surgeries you have had.  Any medical conditions you have, such as sleep apnea.  Whether you are pregnant or may be pregnant.  Any use of cigarettes, alcohol, or street drugs. What are the risks? Generally, this is a safe procedure. However, problems may occur, including:  Getting too much medicine (oversedation).  Nausea.  Allergic reaction to medicines.  Trouble breathing. If this happens, a breathing tube may be used to help with breathing. It will be removed when you are awake and breathing on your own.  Heart trouble.  Lung trouble.  Before the procedure Staying hydrated Follow instructions from your health care provider about hydration, which may include:  Up to 2 hours before the procedure - you may continue to drink clear liquids, such as water, clear fruit juice, black coffee, and plain tea.  Eating and drinking restrictions Follow instructions from your health care provider about eating and drinking, which may include:  8 hours before the procedure - stop eating heavy meals or foods such as meat, fried foods, or fatty foods.  6 hours before the procedure - stop eating light meals or foods,  such as toast or cereal.  6 hours before the procedure - stop drinking milk or drinks that contain milk.  2 hours before the procedure - stop drinking clear liquids.  Medicines Ask your health care provider about:  Changing or stopping your regular medicines. This is especially important if you are taking diabetes medicines or blood thinners.  Taking medicines such as aspirin and ibuprofen. These medicines can thin your blood. Do not take these medicines before your procedure if your health care provider instructs you not to.  Tests and exams  You will have a physical exam.  You may have blood tests done to show: ? How well your kidneys and liver are working. ? How well your blood can clot.  General instructions  Plan to have someone take  you home from the hospital or clinic.  If you will be going home right after the procedure, plan to have someone with you for 24 hours.  What happens during the procedure?  Your blood pressure, heart rate, breathing, level of pain and overall condition will be monitored.  An IV tube will be inserted into one of your veins.  Your anesthesia specialist will give you medicines as needed to keep you comfortable during the procedure. This may mean changing the level of sedation.  The procedure will be performed. After the procedure  Your blood pressure, heart rate, breathing rate, and blood oxygen level will be monitored until the medicines you were given have worn off.  Do not drive for 24 hours if you received a sedative.  You may: ? Feel sleepy, clumsy, or nauseous. ? Feel forgetful about what happened after the procedure. ? Have a sore throat if you had a breathing tube during the procedure. ? Vomit. This information is not intended to replace advice given to you by your health care provider. Make sure you discuss any questions you have with your health care provider. Document Released: 12/14/2004 Document Revised: 08/27/2015 Document  Reviewed: 07/11/2015 Elsevier Interactive Patient Education  2018 [employer] Inc. Monitored Anesthesia Care, Care After These instructions provide you with information about caring for yourself after your procedure. Your health care provider may also give you more specific instructions. Your treatment has been planned according to current medical practices, but problems sometimes occur. Call your health care provider if you have any problems or questions after your procedure. What can I expect after the procedure? After your procedure, it is common to:  Feel sleepy for several hours.  Feel clumsy and have poor balance for several hours.  Feel forgetful about what happened after the procedure.  Have poor judgment for several hours.  Feel nauseous or vomit.  Have a sore throat if you had a breathing tube during the procedure.  Follow these instructions at home: For at least 24 hours after the procedure:   Do not: ? Participate in activities in which you could fall or become injured. ? Drive. ? Use heavy machinery. ? Drink alcohol. ? Take sleeping pills or medicines that cause drowsiness. ? Make important decisions or sign legal documents. ? Take care of children on your own.  Rest. Eating and drinking  Follow the diet that is recommended by your health care provider.  If you vomit, drink water, juice, or soup when you can drink without vomiting.  Make sure you have little or no nausea before eating solid foods. General instructions  Have a responsible adult stay with you until you are awake and alert.  Take over-the-counter and prescription medicines only as told by your health care provider.  If you smoke, do not smoke without supervision.  Keep all follow-up visits as told by your health care provider. This is important. Contact a health care provider if:  You keep feeling nauseous or you keep vomiting.  You feel light-headed.  You develop a rash.  You have a  fever. Get help right away if:  You have trouble breathing. This information is not intended to replace advice given to you by your health care provider. Make sure you discuss any questions you have with your health care provider. Document Released: 07/11/2015 Document Revised: 11/10/2015 Document Reviewed: 07/11/2015 Elsevier Interactive Patient Education  [employer].

## 2017-04-12 ENCOUNTER — Other Ambulatory Visit: Payer: Self-pay

## 2017-04-12 ENCOUNTER — Encounter (HOSPITAL_COMMUNITY)
Admission: RE | Admit: 2017-04-12 | Discharge: 2017-04-12 | Disposition: A | Discharge status: Home / Self Care | Payer: Medicare HMO | Source: Ambulatory Visit | Attending: Gastroenterology | Admitting: Gastroenterology

## 2017-04-12 ENCOUNTER — Encounter (HOSPITAL_COMMUNITY): Payer: Self-pay

## 2017-04-12 DIAGNOSIS — Z01812 Encounter for preprocedural laboratory examination: Secondary | ICD-10-CM | POA: Diagnosis present

## 2017-04-12 LAB — CBC WITH DIFFERENTIAL/PLATELET
Basophils Absolute: 0.1 10*3/uL (ref 0.0–0.1)
Basophils Relative: 1 %
EOS PCT: 3 %
Eosinophils Absolute: 0.3 10*3/uL (ref 0.0–0.7)
HCT: 34.6 % — ABNORMAL LOW (ref 39.0–52.0)
Hemoglobin: 11.7 g/dL — ABNORMAL LOW (ref 13.0–17.0)
LYMPHS ABS: 2.5 10*3/uL (ref 0.7–4.0)
LYMPHS PCT: 31 %
MCH: 36.6 pg — AB (ref 26.0–34.0)
MCHC: 33.8 g/dL (ref 30.0–36.0)
MCV: 108.1 fL — AB (ref 78.0–100.0)
MONO ABS: 0.7 10*3/uL (ref 0.1–1.0)
MONOS PCT: 8 %
Neutro Abs: 4.7 10*3/uL (ref 1.7–7.7)
Neutrophils Relative %: 57 %
PLATELETS: 140 10*3/uL — AB (ref 150–400)
RBC: 3.2 MIL/uL — ABNORMAL LOW (ref 4.22–5.81)
RDW: 13.9 % (ref 11.5–15.5)
WBC: 8.2 10*3/uL (ref 4.0–10.5)

## 2017-04-12 LAB — BASIC METABOLIC PANEL
Anion gap: 12 (ref 5–15)
BUN: 6 mg/dL (ref 6–20)
CALCIUM: 9.4 mg/dL (ref 8.9–10.3)
CO2: 22 mmol/L (ref 22–32)
Chloride: 107 mmol/L (ref 101–111)
Creatinine, Ser: 0.73 mg/dL (ref 0.61–1.24)
GFR calc Af Amer: >60 mL/min (ref >60)
GLUCOSE: 91 mg/dL (ref 65–99)
Potassium: 3.4 mmol/L — ABNORMAL LOW (ref 3.5–5.1)
Sodium: 141 mmol/L (ref 135–145)

## 2017-04-13 ENCOUNTER — Telehealth: Payer: Self-pay | Admitting: Gastroenterology

## 2017-04-13 DIAGNOSIS — B182 Chronic viral hepatitis C: Secondary | ICD-10-CM

## 2017-04-13 LAB — HEPATITIS C ANTIBODY: HCV Ab: >11 s/co ratio — ABNORMAL HIGH (ref 0.0–0.9)

## 2017-04-13 MED ORDER — POTASSIUM CHLORIDE ER 20 MEQ PO TBCR: EXTENDED_RELEASE_TABLET | ORAL | 0 refills | Status: DC

## 2017-04-13 NOTE — Telephone Encounter (Signed)
PLEASE CALL PT. HIS POTASSIUM IS LOW. A RX WAS SENT FOR REPLACEMENT. HIS PLATELET COUNT(140K) AND BLOOD COUNT(Hb 11.7) ARE LOW. HE HAS A POSITIVE Ab FOR HEPATITIS C.  HE CANNOT RECEIVE TREATMENT UNTIL HE STOPS DRINKING COMPLETELY.   HE NEEDS ADDITIONAL BLOOD DRAWN TO SEE IF HE STILL HAS THE VIRUS AND TO SEE IF HE HAS VITAMIN DEFICIENCIES THAT ARE CAUSING HIS BLOOD COUNT TO BE LOW.

## 2017-04-16 NOTE — Pre-Procedure Instructions (Signed)
Have attempted to call patient about potassium and have had to eave voice messages for him. Have not received phone call back to be sure if he has received the message.

## 2017-04-17 ENCOUNTER — Ambulatory Visit (HOSPITAL_COMMUNITY)
Admission: RE | Admit: 2017-04-17 | Discharge: 2017-04-17 | Disposition: A | Discharge status: Home / Self Care | Payer: Medicare HMO | Source: Ambulatory Visit | Attending: Gastroenterology | Admitting: Gastroenterology

## 2017-04-17 ENCOUNTER — Ambulatory Visit (HOSPITAL_COMMUNITY): Payer: Medicare HMO | Admitting: Anesthesiology

## 2017-04-17 ENCOUNTER — Encounter (HOSPITAL_COMMUNITY)
Admission: RE | Disposition: A | Discharge status: Home / Self Care | Payer: Self-pay | Source: Ambulatory Visit | Attending: Gastroenterology

## 2017-04-17 ENCOUNTER — Encounter (HOSPITAL_COMMUNITY): Payer: Self-pay | Admitting: *Deleted

## 2017-04-17 DIAGNOSIS — I10 Essential (primary) hypertension: Secondary | ICD-10-CM | POA: Diagnosis not present

## 2017-04-17 DIAGNOSIS — F17210 Nicotine dependence, cigarettes, uncomplicated: Secondary | ICD-10-CM | POA: Insufficient documentation

## 2017-04-17 DIAGNOSIS — K2970 Gastritis, unspecified, without bleeding: Secondary | ICD-10-CM | POA: Diagnosis not present

## 2017-04-17 DIAGNOSIS — K7460 Unspecified cirrhosis of liver: Secondary | ICD-10-CM | POA: Diagnosis not present

## 2017-04-17 DIAGNOSIS — K2980 Duodenitis without bleeding: Secondary | ICD-10-CM | POA: Diagnosis not present

## 2017-04-17 DIAGNOSIS — K3189 Other diseases of stomach and duodenum: Secondary | ICD-10-CM | POA: Insufficient documentation

## 2017-04-17 DIAGNOSIS — Z91018 Allergy to other foods: Secondary | ICD-10-CM | POA: Insufficient documentation

## 2017-04-17 DIAGNOSIS — K2950 Unspecified chronic gastritis without bleeding: Secondary | ICD-10-CM | POA: Diagnosis not present

## 2017-04-17 DIAGNOSIS — I8510 Secondary esophageal varices without bleeding: Secondary | ICD-10-CM | POA: Diagnosis not present

## 2017-04-17 DIAGNOSIS — K2990 Gastroduodenitis, unspecified, without bleeding: Secondary | ICD-10-CM

## 2017-04-17 DIAGNOSIS — Z91048 Other nonmedicinal substance allergy status: Secondary | ICD-10-CM | POA: Insufficient documentation

## 2017-04-17 DIAGNOSIS — K920 Hematemesis: Secondary | ICD-10-CM | POA: Diagnosis present

## 2017-04-17 DIAGNOSIS — Z91030 Bee allergy status: Secondary | ICD-10-CM | POA: Diagnosis not present

## 2017-04-17 DIAGNOSIS — F319 Bipolar disorder, unspecified: Secondary | ICD-10-CM | POA: Diagnosis not present

## 2017-04-17 DIAGNOSIS — F419 Anxiety disorder, unspecified: Secondary | ICD-10-CM | POA: Insufficient documentation

## 2017-04-17 DIAGNOSIS — K219 Gastro-esophageal reflux disease without esophagitis: Secondary | ICD-10-CM | POA: Insufficient documentation

## 2017-04-17 DIAGNOSIS — F4310 Post-traumatic stress disorder, unspecified: Secondary | ICD-10-CM | POA: Insufficient documentation

## 2017-04-17 DIAGNOSIS — K766 Portal hypertension: Secondary | ICD-10-CM | POA: Diagnosis not present

## 2017-04-17 DIAGNOSIS — I8500 Esophageal varices without bleeding: Secondary | ICD-10-CM

## 2017-04-17 DIAGNOSIS — R69 Illness, unspecified: Secondary | ICD-10-CM | POA: Diagnosis not present

## 2017-04-17 DIAGNOSIS — Z79899 Other long term (current) drug therapy: Secondary | ICD-10-CM | POA: Diagnosis not present

## 2017-04-17 HISTORY — PX: ESOPHAGEAL BANDING: SHX5518

## 2017-04-17 HISTORY — PX: BIOPSY: SHX5522

## 2017-04-17 HISTORY — PX: ESOPHAGOGASTRODUODENOSCOPY (EGD) WITH PROPOFOL: SHX5813

## 2017-04-17 SURGERY — ESOPHAGOGASTRODUODENOSCOPY (EGD) WITH PROPOFOL
Anesthesia: Monitor Anesthesia Care

## 2017-04-17 MED ORDER — PHENYLEPHRINE 40 MCG/ML (10ML) SYRINGE FOR IV PUSH (FOR BLOOD PRESSURE SUPPORT)
PREFILLED_SYRINGE | INTRAVENOUS | Status: AC
  Filled 2017-04-17: qty 10

## 2017-04-17 MED ORDER — CIPROFLOXACIN IN D5W 400 MG/200ML IV SOLN
INTRAVENOUS | Status: DC | PRN
  Administered 2017-04-17: 400 mg via INTRAVENOUS

## 2017-04-17 MED ORDER — LIDOCAINE VISCOUS 2 % MT SOLN: OROMUCOSAL | 5 refills | Status: DC

## 2017-04-17 MED ORDER — FENTANYL CITRATE (PF) 100 MCG/2ML IJ SOLN
25.0000 ug | Freq: Once | INTRAMUSCULAR | Status: AC
  Administered 2017-04-17: 25 ug via INTRAVENOUS

## 2017-04-17 MED ORDER — LIDOCAINE VISCOUS 2 % MT SOLN
5.0000 mL | Freq: Once | OROMUCOSAL | Status: AC
  Administered 2017-04-17: 5 mL via OROMUCOSAL

## 2017-04-17 MED ORDER — MIDAZOLAM HCL 2 MG/2ML IJ SOLN
1.0000 mg | INTRAMUSCULAR | Status: AC
  Administered 2017-04-17 (×2): 2 mg via INTRAVENOUS
  Filled 2017-04-17: qty 2

## 2017-04-17 MED ORDER — LIDOCAINE VISCOUS 2 % MT SOLN
OROMUCOSAL | Status: AC
  Filled 2017-04-17: qty 15

## 2017-04-17 MED ORDER — SODIUM CHLORIDE 0.9% FLUSH
INTRAVENOUS | Status: AC
  Filled 2017-04-17: qty 10

## 2017-04-17 MED ORDER — OMEPRAZOLE 20 MG PO CPDR: DELAYED_RELEASE_CAPSULE | ORAL | 11 refills | Status: DC

## 2017-04-17 MED ORDER — CIPROFLOXACIN HCL 500 MG PO TABS: ORAL_TABLET | ORAL | 0 refills | Status: DC

## 2017-04-17 MED ORDER — LACTATED RINGERS IV SOLN
INTRAVENOUS | Status: DC
  Administered 2017-04-17: 10:00:00 via INTRAVENOUS

## 2017-04-17 MED ORDER — MIDAZOLAM HCL 2 MG/2ML IJ SOLN
INTRAMUSCULAR | Status: AC
  Filled 2017-04-17: qty 2

## 2017-04-17 MED ORDER — MIDAZOLAM HCL 5 MG/5ML IJ SOLN
INTRAMUSCULAR | Status: DC | PRN
  Administered 2017-04-17: 2 mg via INTRAVENOUS

## 2017-04-17 MED ORDER — LIDOCAINE HCL (PF) 1 % IJ SOLN
INTRAMUSCULAR | Status: AC
  Filled 2017-04-17: qty 5

## 2017-04-17 MED ORDER — LIDOCAINE VISCOUS 2 % MT SOLN
10.0000 mL | Freq: Once | OROMUCOSAL | Status: AC
  Administered 2017-04-17: 10 mL via OROMUCOSAL

## 2017-04-17 MED ORDER — FENTANYL CITRATE (PF) 100 MCG/2ML IJ SOLN
INTRAMUSCULAR | Status: AC
  Filled 2017-04-17: qty 2

## 2017-04-17 MED ORDER — CIPROFLOXACIN IN D5W 400 MG/200ML IV SOLN
INTRAVENOUS | Status: AC
  Filled 2017-04-17: qty 200

## 2017-04-17 MED ORDER — PROPOFOL 500 MG/50ML IV EMUL
INTRAVENOUS | Status: DC | PRN
  Administered 2017-04-17: 100 ug/kg/min via INTRAVENOUS

## 2017-04-17 MED ORDER — PROPOFOL 10 MG/ML IV BOLUS
INTRAVENOUS | Status: AC
  Filled 2017-04-17: qty 40

## 2017-04-17 NOTE — H&P (Signed)
Primary Care Physician:  Smitty CordsKaramalegos, Alexander J, DO Primary Gastroenterologist:  Dr. Darrick PennaFields  Pre-Procedure History & Physical: HPI:  Christian Sanders is a 57 y.o. male here for SCREENING FOR VARICES.  Past Medical History:  Diagnosis Date  . Abnormal CT of liver 12/20/2016  . Anxiety   . Bipolar disorder (HCC)   . Dentures complicating chewing    full upper, partial lower  . Depression   . GERD (gastroesophageal reflux disease)   . Headache    s/p facial injury and reconstruction  . Hypertension   . PTSD (post-traumatic stress disorder)   . Vertebral column disorder    2 crushed thoracic vertebra    Past Surgical History:  Procedure Laterality Date  . COLONOSCOPY WITH PROPOFOL N/A 10/09/2014   Procedure: COLONOSCOPY WITH PROPOFOL;  Surgeon: Midge Miniumarren Wohl, MD;  [facility];  Service: Endoscopy;  Laterality: N/A;  . FACIAL RECONSTRUCTION SURGERY     eye socket with 2 metal plates    Prior to Admission medications   Medication Sig Start Date End Date Taking? Authorizing Provider  ALPRAZolam Prudy Feeler(XANAX) 0.5 MG tablet Take 0.5 mg by mouth 2 (two) times daily.  12/15/15  Yes [provider]  Aspirin-Acetaminophen-Caffeine (GOODY HEADACHE PO) Take 1 packet by mouth daily as needed (for pain or headache).    Yes [provider]  lisinopril (PRINIVIL,ZESTRIL) 40 MG tablet TAKE 1 TABLET BY MOUTH ONCE EVERY MORNING 06/12/16  Yes Janeann ForehandHawkins, James H Jr., MD  Potassium Chloride ER 20 MEQ TBCR 2 PO BID FOR 3 DAYS 04/13/17  Yes Kenecia Barren L, MD  amLODipine (NORVASC) 10 MG tablet TAKE 1 TABLET BY MOUTH ONCE DAILY Patient not taking: Reported on 04/04/2017 07/14/16   Smitty CordsKaramalegos, Alexander J, DO  omeprazole (PRILOSEC) 20 MG capsule TAKE 1 CAPSULE BY MOUTH TWICE DAILY AS NEEDED. Patient not taking: Reported on 04/04/2017 07/13/16   Smitty CordsKaramalegos, Alexander J, DO  sildenafil (REVATIO) 20 MG tablet TAKE 1 TO 5 TABLETS BY MOUTH ONCE DAILY AS NEEDED Patient not taking: Reported  on 04/04/2017 02/28/16   Janeann ForehandHawkins, James H Jr., MD    Allergies as of 03/08/2017 - Review Complete 03/08/2017  Allergen Reaction Noted  . Bee venom Swelling 12/28/2015  . Pineapple Nausea And Vomiting 10/06/2014  . Pollen extract Other (See Comments) 06/03/2015    Family History  Problem Relation Age of Onset  . Colon cancer Neg Hx   . Liver disease Neg Hx     Social History   Socioeconomic History  . Marital status: Single    Spouse name: Not on file  . Number of children: Not on file  . Years of education: Not on file  . Highest education level: Not on file  Social Needs  . Financial resource strain: Not on file  . Food insecurity - worry: Not on file  . Food insecurity - inability: Not on file  . Transportation needs - medical: Not on file  . Transportation needs - non-medical: Not on file  Occupational History  . Not on file  Tobacco Use  . Smoking status: Current Every Day Smoker    Packs/day: 0.25    Years: 30.00    Pack years: 7.50    Types: Cigarettes  . Smokeless tobacco: Never Used  Substance and Sexual Activity  . Alcohol use: Yes    Alcohol/week: 3.6 oz    Types: 6 Cans of beer per week    Comment: None as of 03/08/17; previously 1-1.5 cases of beer a day  in his 75s.  . Drug use: No  . Sexual activity: No    Birth control/protection: None  Other Topics Concern  . Not on file  Social History Narrative  . Not on file    Review of Systems: See HPI, otherwise negative ROS   Physical Exam: BP (!) 150/94   Pulse 83   Temp 98.3 F (36.8 C) (Oral)   Resp 13   Ht 6\' 2"  (1.88 m)   Wt 240 lb (108.9 kg)   SpO2 96%   BMI 30.81 kg/m  General:   Alert,  pleasant and cooperative in NAD Head:  Normocephalic and atraumatic. Neck:  Supple; Lungs:  Clear throughout to auscultation.    Heart:  Regular rate and rhythm. Abdomen:  Soft, nontender and nondistended. Normal bowel sounds, without guarding, and without rebound.   Neurologic:  Alert and  oriented  x4;  grossly normal neurologically.  Impression/Plan:     SCREENING VARICES  PLAN:  1.EGD TODAY DISCUSSED PROCEDURE, BENEFITS, & RISKS: < 1% chance of medication reaction, bleeding, OR perforation.

## 2017-04-17 NOTE — Discharge Instructions (Signed)
You have esophageal varices. I PLACED THREE BANDS TO PREVENT BLEEDING. YOU have changes in your stomach due to scarring in your liver. YOU HAVE GASTRITIS AND DUODENITIS DUE TO GOODY POWDERS. I BIOPSIED YOUR STOMACH.     YOU SHOULD STRICTLY AVOID ASPIRIN, BC/GOODY POWDERS, IBUPROFEN/MOTRIN, OR NAPROXEN/ALEVE. If YOU DO NOT IT COULD RESULT IN BLEEDING THAT COULD LEAD TO DEATH.   TAKE OMEPRAZOLE every morning.  TAKE ALL OF YOUR CIPRO AS PRESCRIBED. TAKE YOUR FIRST DOSE TONIGHT. MEDICATION SIDE EFFECTS INCLUDE HEEL PAIN, NAUSEA, VOMITING.   REPEAT EGD IN 4 WEEKS TO REASSESS VARICES & POSSIBLE BANDING.  FOLLOW UP IN [facility] 2019.  UPPER ENDOSCOPY AFTER CARE Read the instructions outlined below and refer to this sheet in the next week. These discharge instructions provide you with general information on caring for yourself after you leave the hospital. While your treatment has been planned according to the most current medical practices available, unavoidable complications occasionally occur. If you have any problems or questions after discharge, call DR. Yardley Lekas, [phone].  ACTIVITY  You may resume your regular activity, but move at a slower pace for the next 24 hours.   Take frequent rest periods for the next 24 hours.   Walking will help get rid of the air and reduce the bloated feeling in your belly (abdomen).   No driving for 24 hours (because of the medicine (anesthesia) used during the test).   You may shower.   Do not sign any important legal documents or operate any machinery for 24 hours (because of the anesthesia used during the test).    NUTRITION  Drink plenty of fluids.   You may resume your normal diet as instructed by your doctor.   Begin with a light meal and progress to your normal diet. Heavy or fried foods are harder to digest and may make you feel sick to your stomach (nauseated).   Avoid alcoholic beverages for 24 hours or as instructed.     MEDICATIONS  You may resume your normal medications.   WHAT YOU CAN EXPECT TODAY  Some feelings of bloating in the abdomen.   Passage of more gas than usual.    IF YOU HAD A BIOPSY TAKEN DURING THE UPPER ENDOSCOPY:  Eat a soft diet IF YOU HAVE NAUSEA, BLOATING, ABDOMINAL PAIN, OR VOMITING.    FINDING OUT THE RESULTS OF YOUR TEST Not all test results are available during your visit. DR. Darrick Penna WILL CALL YOU WITHIN 14 DAYS OF YOUR PROCEDUE WITH YOUR RESULTS. Do not assume everything is normal if you have not heard from DR. Kallin Henk, CALL HER OFFICE AT [phone].  SEEK IMMEDIATE MEDICAL ATTENTION AND CALL THE OFFICE: [phone] IF:  You have more than a spotting of blood in your stool.   Your belly is swollen (abdominal distention).   You are nauseated or vomiting.   You have a temperature over 101F.   You have abdominal pain or discomfort that is severe or gets worse throughout the day.   Moderate Conscious Sedation, Adult, Care After These instructions provide you with information about caring for yourself after your procedure. Your health care provider may also give you more specific instructions. Your treatment has been planned according to current medical practices, but problems sometimes occur. Call your health care provider if you have any problems or questions after your procedure. What can I expect after the procedure? After your procedure, it is common:  To feel sleepy for several hours.  To feel clumsy and have  poor balance for several hours.  To have poor judgment for several hours.  To vomit if you eat too soon.  Follow these instructions at home: For at least 24 hours after the procedure:   Do not: ? Participate in activities where you could fall or become injured. ? Drive. ? Use heavy machinery. ? Drink alcohol. ? Take sleeping pills or medicines that cause drowsiness. ? Make important decisions or sign legal documents. ? Take care of  children on your own.  Rest. Eating and drinking  Follow the diet recommended by your health care provider.  If you vomit: ? Drink water, juice, or soup when you can drink without vomiting. ? Make sure you have little or no nausea before eating solid foods. General instructions  Have a responsible adult stay with you until you are awake and alert.  Take over-the-counter and prescription medicines only as told by your health care provider.  If you smoke, do not smoke without supervision.  Keep all follow-up visits as told by your health care provider. This is important. Contact a health care provider if:  You keep feeling nauseous or you keep vomiting.  You feel light-headed.  You develop a rash.  You have a fever. Get help right away if:  You have trouble breathing. This information is not intended to replace advice given to you by your health care provider. Make sure you discuss any questions you have with your health care provider. Document Released: 01/08/2013 Document Revised: 08/23/2015 Document Reviewed: 07/10/2015 Elsevier Interactive Patient Education  [employer]2018 [employer]

## 2017-04-17 NOTE — Anesthesia Preprocedure Evaluation (Signed)
Anesthesia Evaluation  Patient identified by MRN, date of birth, ID band Patient awake    Reviewed: Allergy & Precautions, NPO status , Patient's Chart, lab work & pertinent test results  Airway Mallampati: II  TM Distance: >3 FB Neck ROM: Full    Dental  (+) Edentulous Upper   Pulmonary Current Smoker,    breath sounds clear to auscultation       Cardiovascular hypertension, Pt. on medications  Rhythm:Regular Rate:Normal     Neuro/Psych  Headaches, PSYCHIATRIC DISORDERS (PTSD) Anxiety Depression Bipolar Disorder    GI/Hepatic GERD  ,(+) Cirrhosis  ( Portal hypertension )  Esophageal Varices    ,   Endo/Other    Renal/GU      Musculoskeletal 2 crushed thoracic vertebra   Abdominal   Peds  Hematology   Anesthesia Other Findings   Reproductive/Obstetrics                             Anesthesia Physical Anesthesia Plan  ASA: III  Anesthesia Plan: MAC   Post-op Pain Management:    Induction: Intravenous  PONV Risk Score and Plan:   Airway Management Planned: Simple Face Mask  Additional Equipment:   Intra-op Plan:   Post-operative Plan:   Informed Consent: I have reviewed the patients History and Physical, chart, labs and discussed the procedure including the risks, benefits and alternatives for the proposed anesthesia with the patient or authorized representative who has indicated his/her understanding and acceptance.     Plan Discussed with:   Anesthesia Plan Comments:         Anesthesia Quick Evaluation

## 2017-04-17 NOTE — Transfer of Care (Signed)
Immediate Anesthesia Transfer of Care Note  Patient: Christian Sanders  Procedure(s) Performed: ESOPHAGOGASTRODUODENOSCOPY (EGD) WITH PROPOFOL (N/A ) ESOPHAGEAL BANDING (N/A ) BIOPSY  Patient Location: PACU  Anesthesia Type:MAC  Level of Consciousness: awake, alert  and oriented  Airway & Oxygen Therapy: Patient Spontanous Breathing and Patient connected to nasal cannula oxygen  Post-op Assessment: Report given to RN and Post -op Vital signs reviewed and stable  Post vital signs: Reviewed and stable  Last Vitals:  Vitals:   04/17/17 1150 04/17/17 1155  BP: (!) 148/94 (!) 143/91  Pulse:    Resp: 16 18  Temp:    SpO2: 94% 95%    Last Pain:  Vitals:   04/17/17 0908  TempSrc: Oral      Patients Stated Pain Goal: 5 (16/10/96 0454)  Complications: No apparent anesthesia complications

## 2017-04-17 NOTE — Op Note (Signed)
[facility] Patient Name: Christian CruelKarl Sanders Procedure Date: 04/17/2017 11:59 AM MRN: [id number] Date of Birth: 11/22/1960 Attending MD: Jonette EvaSandi Nayali Talerico MD, MD CSN: [id number] Age: [age] Admit Type: Outpatient Procedure:                Upper GI endoscopy Indications:              1st degree variceal surveillance (known small                            varices, no prior bleeding). THREW UP BLOOD AT HOME                            DEC 2018. USES [employer] POWDERS. NO ETOH CURRENTLY.                            HEP C Ab POSITIVE. Providers:                Jonette EvaSandi Lourene Hoston MD, MD, Jannett CelestineAnitra Bell, RN, Edrick Kinsammy Vaught,                            RN Referring MD:             Smitty CordsAlexander J. Karamalegos Medicines:                Propofol per Anesthesia Complications:            No immediate complications. Estimated Blood Loss:     Estimated blood loss was minimal. Procedure:                Pre-Anesthesia Assessment:                           - Prior to the procedure, a History and Physical                            was performed, and patient medications and                            allergies were reviewed. The patient's tolerance of                            previous anesthesia was also reviewed. The risks                            and benefits of the procedure and the sedation                            options and risks were discussed with the patient.                            All questions were answered, and informed consent                            was obtained. Prior Anticoagulants: The patient has  taken aspirin, last dose was 1 day prior to                            procedure. ASA Grade Assessment: II - A patient                            with mild systemic disease. After reviewing the                            risks and benefits, the patient was deemed in                            satisfactory condition to undergo the procedure.                            After obtaining  informed consent, the endoscope was                            passed under direct vision. Throughout the                            procedure, the patient's blood pressure, pulse, and                            oxygen saturations were monitored continuously. The                            [id number]-[id number] ([id number]) scope was introduced through the                            mouth, and advanced to the second part of duodenum.                            The upper GI endoscopy was accomplished without                            difficulty. The patient tolerated the procedure                            well. Scope In: 12:28:20 PM Scope Out: 12:43:25 PM Total Procedure Duration: 0 hours 15 minutes 5 seconds  Findings:      TWO COLUMNS Grade III varices were found in the distal esophagus. They       were 3 to 4 mm in largest diameter. Three bands were successfully placed       with complete eradication, resulting in deflation of varices. There was       no bleeding at the end of the maneuver. ONE COLUMN GRADE II AND ONE       COLMUN GRADE 1 VARICES. Estimated blood loss was minimal.      Moderate portal hypertensive gastropathy was found in the cardia, in the       gastric fundus and in the gastric body.      Patchy mild inflammation characterized by congestion (edema), erosions  and erythema was found in the gastric antrum. Biopsies were taken with a       cold forceps for Helicobacter pylori testing.      Diffuse mild inflammation characterized by congestion (edema) and       erythema was found in the duodenal bulb.      The second portion of the duodenum was normal. Impression:               - Grade III esophageal varices. Completely                            eradicated. Banded.                           - MODERATE Portal hypertensive gastropathy.                           - MILD Gastritis AND MODERATE Duodenitis. Moderate Sedation:      Per Anesthesia Care Recommendation:           -  Repeat upper endoscopy in 1 month for retreatment.                           - Return to GI office in 2 months.                           - Resume previous diet.                           - Use Prilosec (omeprazole) 20 mg PO daily.                           - Patient has a contact number available for                            emergencies. The signs and symptoms of potential                            delayed complications were discussed with the                            patient. Return to normal activities tomorrow.                            Written discharge instructions were provided to the                            patient.                           - Continue present medications. Procedure Code(s):        --- Professional ---                           [phone], Esophagogastroduodenoscopy, flexible,  transoral; with band ligation of esophageal/gastric                            varices                           43239, Esophagogastroduodenoscopy, flexible,                            transoral; with biopsy, single or multiple Diagnosis Code(s):        --- Professional ---                           I85.00, Esophageal varices without bleeding                           K76.6, Portal hypertension                           K31.89, Other diseases of stomach and duodenum                           K29.70, Gastritis, unspecified, without bleeding                           K29.80, Duodenitis without bleeding CPT copyright 2016 [employer]. All rights reserved. The codes documented in this report are preliminary and upon coder review may  be revised to meet current compliance requirements. Jonette Eva, MD Jonette Eva MD, MD 04/17/2017 12:58:44 PM This report has been signed electronically. Number of Addenda: 0

## 2017-04-18 NOTE — Anesthesia Postprocedure Evaluation (Signed)
Anesthesia Post Note  Patient: Christian Sanders  Procedure(s) Performed: ESOPHAGOGASTRODUODENOSCOPY (EGD) WITH PROPOFOL (N/A ) ESOPHAGEAL BANDING (N/A ) BIOPSY  Patient location during evaluation: Nursing Unit Anesthesia Type: MAC Level of consciousness: awake Pain management: pain level controlled Vital Signs Assessment: post-procedure vital signs reviewed and stable Respiratory status: spontaneous breathing, nonlabored ventilation and respiratory function stable Cardiovascular status: blood pressure returned to baseline Postop Assessment: no apparent nausea or vomiting and adequate PO intake Anesthetic complications: no     Last Vitals:  Vitals:   04/17/17 1345 04/17/17 1350  BP: 133/87 (!) 141/89  Pulse: 73 70  Resp: 14 16  Temp:  36.7 C  SpO2: 95% 97%    Last Pain:  Vitals:   04/17/17 1350  TempSrc: Oral  PainSc: 0-No pain                 Sondra Blixt J

## 2017-04-19 ENCOUNTER — Other Ambulatory Visit: Payer: Self-pay | Admitting: *Deleted

## 2017-04-19 ENCOUNTER — Encounter: Payer: Self-pay | Admitting: *Deleted

## 2017-04-19 ENCOUNTER — Encounter (HOSPITAL_COMMUNITY): Payer: Self-pay | Admitting: Gastroenterology

## 2017-04-19 DIAGNOSIS — K7030 Alcoholic cirrhosis of liver without ascites: Secondary | ICD-10-CM

## 2017-04-19 NOTE — Progress Notes (Signed)
PT is aware of results and plan. He said he has not had any change in his meds. He is aware he will be called to schedule the repeat EGD to have in 4 weeks. Forwarding to [facility] to schedule.

## 2017-04-24 NOTE — Telephone Encounter (Signed)
Have attempted call x 2. Left messages. Will continue to try and contact.

## 2017-04-24 NOTE — Telephone Encounter (Signed)
PT called. He is aware that [facility] [employer] has left for the day. He can be reached at [phone]. Said he did not pick up potassium until after his procedure, but he still hasn't taken it.

## 2017-04-26 NOTE — Telephone Encounter (Signed)
Was able to speak to the patient. He states he has stopped drinking (so far, no ETOH in 2+ weeks). He is aware of lab orders and will have those completed. Has OV schedule 06/06/17 with EG, can discuss HCV treatment options at that point pending ETOH abstinence. Would likely benefit from requiring 6 months without ETOH to ok treatment.  Cc: [facility] (add any comments needed)

## 2017-04-26 NOTE — Telephone Encounter (Signed)
REVIEWED. AGREE. NO ADDITIONAL RECOMMENDATIONS. 

## 2017-05-02 ENCOUNTER — Emergency Department (HOSPITAL_COMMUNITY): Payer: Medicare HMO

## 2017-05-02 ENCOUNTER — Emergency Department (HOSPITAL_COMMUNITY)
Admission: EM | Admit: 2017-05-02 | Discharge: 2017-05-02 | Disposition: A | Discharge status: Home / Self Care | Payer: Medicare HMO | Attending: Emergency Medicine | Admitting: Emergency Medicine

## 2017-05-02 ENCOUNTER — Encounter (HOSPITAL_COMMUNITY): Payer: Self-pay

## 2017-05-02 DIAGNOSIS — R51 Headache: Secondary | ICD-10-CM | POA: Insufficient documentation

## 2017-05-02 DIAGNOSIS — F17210 Nicotine dependence, cigarettes, uncomplicated: Secondary | ICD-10-CM | POA: Insufficient documentation

## 2017-05-02 DIAGNOSIS — K5792 Diverticulitis of intestine, part unspecified, without perforation or abscess without bleeding: Secondary | ICD-10-CM

## 2017-05-02 DIAGNOSIS — I10 Essential (primary) hypertension: Secondary | ICD-10-CM | POA: Diagnosis not present

## 2017-05-02 DIAGNOSIS — R42 Dizziness and giddiness: Secondary | ICD-10-CM | POA: Diagnosis not present

## 2017-05-02 DIAGNOSIS — Z79899 Other long term (current) drug therapy: Secondary | ICD-10-CM | POA: Insufficient documentation

## 2017-05-02 DIAGNOSIS — R519 Headache, unspecified: Secondary | ICD-10-CM

## 2017-05-02 DIAGNOSIS — R109 Unspecified abdominal pain: Secondary | ICD-10-CM | POA: Diagnosis not present

## 2017-05-02 DIAGNOSIS — R69 Illness, unspecified: Secondary | ICD-10-CM | POA: Diagnosis not present

## 2017-05-02 DIAGNOSIS — H81399 Other peripheral vertigo, unspecified ear: Secondary | ICD-10-CM

## 2017-05-02 HISTORY — DX: Other diseases of stomach and duodenum: K31.89

## 2017-05-02 HISTORY — DX: Esophageal varices without bleeding: I85.00

## 2017-05-02 HISTORY — DX: Portal hypertension: K76.6

## 2017-05-02 HISTORY — DX: Gastroduodenitis, unspecified, without bleeding: K29.90

## 2017-05-02 LAB — URINALYSIS, ROUTINE W REFLEX MICROSCOPIC
Bilirubin Urine: NEGATIVE
Glucose, UA: NEGATIVE mg/dL
Hgb urine dipstick: NEGATIVE
KETONES UR: NEGATIVE mg/dL
LEUKOCYTES UA: NEGATIVE
NITRITE: NEGATIVE
PROTEIN: NEGATIVE mg/dL
Specific Gravity, Urine: 1.006 (ref 1.005–1.030)
pH: 7 (ref 5.0–8.0)

## 2017-05-02 LAB — COMPREHENSIVE METABOLIC PANEL
ALBUMIN: 3.3 g/dL — AB (ref 3.5–5.0)
ALT: 31 U/L (ref 17–63)
ANION GAP: 11 (ref 5–15)
AST: 52 U/L — ABNORMAL HIGH (ref 15–41)
Alkaline Phosphatase: 160 U/L — ABNORMAL HIGH (ref 38–126)
BILIRUBIN TOTAL: 2.4 mg/dL — AB (ref 0.3–1.2)
BUN: 7 mg/dL (ref 6–20)
CALCIUM: 9.2 mg/dL (ref 8.9–10.3)
CO2: 22 mmol/L (ref 22–32)
Chloride: 106 mmol/L (ref 101–111)
Creatinine, Ser: 0.69 mg/dL (ref 0.61–1.24)
GFR calc non Af Amer: >60 mL/min (ref >60)
GLUCOSE: 105 mg/dL — AB (ref 65–99)
POTASSIUM: 3.2 mmol/L — AB (ref 3.5–5.1)
SODIUM: 139 mmol/L (ref 135–145)
TOTAL PROTEIN: 8.1 g/dL (ref 6.5–8.1)

## 2017-05-02 LAB — CBC WITH DIFFERENTIAL/PLATELET
BASOS PCT: 1 %
Basophils Absolute: 0 10*3/uL (ref 0.0–0.1)
EOS ABS: 0.2 10*3/uL (ref 0.0–0.7)
Eosinophils Relative: 2 %
HEMATOCRIT: 36.8 % — AB (ref 39.0–52.0)
Hemoglobin: 12.2 g/dL — ABNORMAL LOW (ref 13.0–17.0)
LYMPHS ABS: 2.7 10*3/uL (ref 0.7–4.0)
Lymphocytes Relative: 30 %
MCH: 35.1 pg — AB (ref 26.0–34.0)
MCHC: 33.2 g/dL (ref 30.0–36.0)
MCV: 105.7 fL — ABNORMAL HIGH (ref 78.0–100.0)
MONO ABS: 0.8 10*3/uL (ref 0.1–1.0)
Monocytes Relative: 9 %
Neutro Abs: 5.1 10*3/uL (ref 1.7–7.7)
Neutrophils Relative %: 58 %
Platelets: 142 10*3/uL — ABNORMAL LOW (ref 150–400)
RBC: 3.48 MIL/uL — ABNORMAL LOW (ref 4.22–5.81)
RDW: 14 % (ref 11.5–15.5)
WBC: 8.9 10*3/uL (ref 4.0–10.5)

## 2017-05-02 LAB — TROPONIN I

## 2017-05-02 LAB — LIPASE, BLOOD: LIPASE: 46 U/L (ref 11–51)

## 2017-05-02 LAB — MAGNESIUM: Magnesium: 1.9 mg/dL (ref 1.7–2.4)

## 2017-05-02 MED ORDER — PROCHLORPERAZINE MALEATE 5 MG PO TABS: 5.0000 mg | ORAL_TABLET | Freq: Two times a day (BID) | ORAL | 0 refills | Status: DC | PRN

## 2017-05-02 MED ORDER — MORPHINE SULFATE (PF) 4 MG/ML IV SOLN
4.0000 mg | INTRAVENOUS | Status: DC | PRN
  Administered 2017-05-02: 4 mg via INTRAVENOUS
  Filled 2017-05-02: qty 1

## 2017-05-02 MED ORDER — AMOXICILLIN-POT CLAVULANATE 875-125 MG PO TABS
1.0000 | ORAL_TABLET | Freq: Once | ORAL | Status: AC
  Administered 2017-05-02: 1 via ORAL
  Filled 2017-05-02: qty 1

## 2017-05-02 MED ORDER — PROMETHAZINE HCL 25 MG/ML IJ SOLN: 12.5000 mg | Freq: Once | INTRAMUSCULAR | Status: DC

## 2017-05-02 MED ORDER — METRONIDAZOLE 500 MG PO TABS: 500.0000 mg | ORAL_TABLET | Freq: Once | ORAL | Status: DC

## 2017-05-02 MED ORDER — IOPAMIDOL (ISOVUE-300) INJECTION 61%
100.0000 mL | Freq: Once | INTRAVENOUS | Status: AC | PRN
  Administered 2017-05-02: 100 mL via INTRAVENOUS

## 2017-05-02 MED ORDER — PANTOPRAZOLE SODIUM 40 MG IV SOLR
40.0000 mg | Freq: Once | INTRAVENOUS | Status: AC
  Administered 2017-05-02: 40 mg via INTRAVENOUS
  Filled 2017-05-02: qty 40

## 2017-05-02 MED ORDER — PROCHLORPERAZINE EDISYLATE 5 MG/ML IJ SOLN
5.0000 mg | Freq: Once | INTRAMUSCULAR | Status: AC
  Administered 2017-05-02: 5 mg via INTRAVENOUS
  Filled 2017-05-02: qty 2

## 2017-05-02 MED ORDER — AMOXICILLIN-POT CLAVULANATE 875-125 MG PO TABS: 1.0000 | ORAL_TABLET | Freq: Two times a day (BID) | ORAL | 0 refills | Status: DC

## 2017-05-02 MED ORDER — POTASSIUM CHLORIDE CRYS ER 20 MEQ PO TBCR
40.0000 meq | EXTENDED_RELEASE_TABLET | Freq: Once | ORAL | Status: AC
  Administered 2017-05-02: 40 meq via ORAL
  Filled 2017-05-02: qty 2

## 2017-05-02 MED ORDER — PROCHLORPERAZINE MALEATE 10 MG PO TABS: 10.0000 mg | ORAL_TABLET | Freq: Two times a day (BID) | ORAL | 0 refills | Status: DC | PRN

## 2017-05-02 MED ORDER — CIPROFLOXACIN HCL 250 MG PO TABS: 500.0000 mg | ORAL_TABLET | Freq: Once | ORAL | Status: DC

## 2017-05-02 NOTE — ED Notes (Signed)
Patient eating Doritos and drinking Mt Dew at this time. Patient watching tv and laughing at this time.

## 2017-05-02 NOTE — Discharge Instructions (Addendum)
Take the prescriptions as directed.  Call your regular medical doctor tomorrow to schedule a follow up appointment within the next 2 days. Call your GI doctor to schedule a follow up appointment within the next week. Call the Neurologist tomorrow to schedule a follow up appointment within the next week.  Return to the Emergency Department immediately sooner if worsening.

## 2017-05-02 NOTE — ED Triage Notes (Signed)
Pt had endoscopy 2 weeks ago.  Reports having headaches and dizziness since then.  Reports fever daily since Friday.  C/O pain in left side of abd x 3 days.

## 2017-05-02 NOTE — ED Provider Notes (Signed)
[facility] EMERGENCY DEPARTMENT Provider Note   CSN: [id number] Arrival date & time: 05/02/17  1734     History   Chief Complaint Chief Complaint  Patient presents with  . Abdominal Pain  . Dizziness    HPI Christian Sanders is a 57 y.o. male.  HPI  Pt was seen at 1935.  Per pt, c/o gradual onset and persistence of constant multiple complaints for the past 1 to 2 weeks. Pt c/o left sided abd "pain" for the past 1 week, worse over the past 3 days. Pt also c/o acute flair of his chronic headache, as well as "dizziness" for the past 2 weeks. Describes the dizziness as "everything is spinning around," worsens with head movement side to side. Describes his headache as per his usual chronic headache pain pattern. Pt states he may have had a fever several days ago. Pt has not taken any medications to treat his symptoms. Denies headache was sudden or maximal at onset or at any time. Denies N/V, no diarrhea, no fevers, no back pain, no rash, no CP/SOB, no black or blood in stools, no rash, no neck pain, no visual changes, no focal motor weakness, no tingling/numbness in extremities, no ataxia, no slurred speech, no facial droop.  .      Past Medical History:  Diagnosis Date  . Abnormal CT of liver 12/20/2016  . Anxiety   . Bipolar disorder (HCC)   . Dentures complicating chewing    full upper, partial lower  . Depression   . Esophageal varices determined by endoscopy (HCC)   . Gastritis and duodenitis   . GERD (gastroesophageal reflux disease)   . Headache    chronic, s/p facial injury and reconstruction  . Hypertension   . Portal hypertensive gastropathy (HCC)   . PTSD (post-traumatic stress disorder)   . Vertebral column disorder    2 crushed thoracic vertebra    Patient Active Problem List   Diagnosis Date Noted  . Varices of esophagus determined by endoscopy (HCC)   . Gastritis and duodenitis   . Splenomegaly 03/08/2017  . Hepatic cirrhosis (HCC) 12/27/2016  . RUQ pain  12/27/2016  . Portal hypertension (HCC) 12/27/2016  . Elevated liver enzymes 03/02/2016  . BPH without urinary obstruction 02/28/2016  . ED (erectile dysfunction) 12/28/2015  . SK (seborrheic keratosis) 12/28/2015  . Chronic headaches 02/09/2015  . Tobacco abuse 02/09/2015  . Hypertension 11/23/2014  . Bipolar disorder (HCC) 11/23/2014  . PTSD (post-traumatic stress disorder) 11/23/2014  . Special screening for malignant neoplasms, colon   . Chronic mid back pain 12/16/2013  . Closed compression fracture of thoracic vertebra (HCC) 09/05/2013    Past Surgical History:  Procedure Laterality Date  . BIOPSY  04/17/2017   Procedure: BIOPSY;  Surgeon: West Bali, MD;  [facility]: [facility];  Service: Endoscopy;;  gastric for h pylori  . COLONOSCOPY WITH PROPOFOL N/A 10/09/2014   Procedure: COLONOSCOPY WITH PROPOFOL;  Surgeon: Midge Minium, MD;  Location: [facility];  Service: Endoscopy;  Laterality: N/A;  . ESOPHAGEAL BANDING N/A 04/17/2017   Procedure: ESOPHAGEAL BANDING;  Surgeon: West Bali, MD;  Location: [facility];  Service: Endoscopy;  Laterality: N/A;  . ESOPHAGOGASTRODUODENOSCOPY (EGD) WITH PROPOFOL N/A 04/17/2017   Procedure: ESOPHAGOGASTRODUODENOSCOPY (EGD) WITH PROPOFOL;  Surgeon: West Bali, MD;  Location: [facility];  Service: Endoscopy;  Laterality: N/A;  10:45am  . FACIAL RECONSTRUCTION SURGERY     eye socket with 2 metal plates  Home Medications    Prior to Admission medications   Medication Sig Start Date End Date Taking? Authorizing Provider  ALPRAZolam Prudy Feeler) 0.5 MG tablet Take 0.5 mg by mouth 2 (two) times daily.  12/15/15   [provider]  amLODipine (NORVASC) 10 MG tablet TAKE 1 TABLET BY MOUTH ONCE DAILY Patient not taking: Reported on 04/04/2017 07/14/16   Smitty Cords, DO  ciprofloxacin (CIPRO) 500 MG tablet 1 po bid for 7 days 04/17/17   West Bali, MD  lidocaine (XYLOCAINE) 2 % solution 2 TSP PO Q4H  IF NEEDED. USE 30 MINS PRIOR TO MEALS AND PRN FOR ABDOMINAL/CHEST PAIN.  NO MORE> 8 DOSES/DAY. 04/17/17   Fields, Darleene Cleaver, MD  lisinopril (PRINIVIL,ZESTRIL) 40 MG tablet TAKE 1 TABLET BY MOUTH ONCE EVERY MORNING 06/12/16   Janeann Forehand., MD  omeprazole (PRILOSEC) 20 MG capsule 1 PO 30 MINS PRIOR TO FIRST MEAL 04/17/17   Fields, Darleene Cleaver, MD  sildenafil (REVATIO) 20 MG tablet TAKE 1 TO 5 TABLETS BY MOUTH ONCE DAILY AS NEEDED Patient not taking: Reported on 04/04/2017 02/28/16   Janeann Forehand., MD    Family History Family History  Problem Relation Age of Onset  . Colon cancer Neg Hx   . Liver disease Neg Hx     Social History Social History   Tobacco Use  . Smoking status: Current Every Day Smoker    Packs/day: 0.25    Years: 30.00    Pack years: 7.50    Types: Cigarettes  . Smokeless tobacco: Never Used  Substance Use Topics  . Alcohol use: Yes    Alcohol/week: 3.6 oz    Types: 6 Cans of beer per week    Comment: None as of 03/08/17; previously 1-1.5 cases of beer a day in his 11s.  . Drug use: No     Allergies   Bee venom; Pineapple; and Pollen extract   Review of Systems Review of Systems ROS: Statement: All systems negative except as marked or noted in the HPI; Constitutional: Negative for fever and +chills. ; ; Eyes: Negative for eye pain, redness and discharge. ; ; ENMT: Negative for ear pain, hoarseness, nasal congestion, sinus pressure and sore throat. ; ; Cardiovascular: Negative for chest pain, palpitations, diaphoresis, dyspnea and peripheral edema. ; ; Respiratory: Negative for cough, wheezing and stridor. ; ; Gastrointestinal: +abd pain. Negative for nausea, vomiting, diarrhea, blood in stool, hematemesis, jaundice and rectal bleeding. . ; ; Genitourinary: Negative for dysuria, flank pain and hematuria. ; ; Genital:  No penile drainage or rash, no testicular pain or swelling, no scrotal rash or swelling. ;; Musculoskeletal: Negative for back pain and neck pain.  Negative for swelling and trauma.; ; Skin: Negative for pruritus, rash, abrasions, blisters, bruising and skin lesion.; ; Neuro: +"spinning," chronic headache. Negative for lightheadedness and neck stiffness. Negative for weakness, altered level of consciousness, altered mental status, extremity weakness, paresthesias, involuntary movement, seizure and syncope.       Physical Exam Updated Vital Signs BP (!) 156/91 (BP Location: Left Arm)   Temp 98.1 F (36.7 C) (Oral)   Resp 17   Ht 6\' 2"  (1.88 m)   Wt 108.9 kg (240 lb)   SpO2 99%   BMI 30.81 kg/m   Physical Exam 1940: Physical examination:  Nursing notes reviewed; Vital signs and O2 SAT reviewed;  Constitutional: Well developed, Well nourished, Well hydrated, In no acute distress; Head:  Normocephalic, atraumatic; Eyes: EOMI, PERRL, No scleral  icterus; ENMT: TM's clear bilat. +edemetous nasal turbinates bilat with clear rhinorrhea. Mouth and pharynx normal, Mucous membranes moist; Neck: Supple, Full range of motion, No lymphadenopathy. No meningeal signs.; Cardiovascular: Regular rate and rhythm, No gallop; Respiratory: Breath sounds clear & equal bilaterally, No wheezes.  Speaking full sentences with ease, Normal respiratory effort/excursion; Chest: Nontender, Movement normal; Abdomen: Soft, +LUQ and LLQ tenderness to palp. Nondistended, Normal bowel sounds; Genitourinary: No CVA tenderness; Extremities: Pulses normal, No tenderness, No edema, No calf edema or asymmetry.; Neuro: AA&Ox3, Major CN grossly intact. No facial droop. +right horizontal end gaze fatigable nystagmus which reproduces pt's spinning. Speech clear. No gross focal motor or sensory deficits in extremities. Climbs on and off stretcher easily by himself. Gait steady..; Skin: Color normal, Warm, Dry.   ED Treatments / Results  Labs (all labs ordered are listed, but only abnormal results are displayed)   EKG  EKG Interpretation  Date/Time:  Wednesday May 02 2017  19:02:46 EST Ventricular Rate:  91 PR Interval:  158 QRS Duration: 104 QT Interval:  408 QTC Calculation: 501 R Axis:   -51 Text Interpretation:  Normal sinus rhythm Incomplete right bundle branch block Left anterior fascicular block Septal infarct , age undetermined Prolonged QT When compared with ECG of 10/24/2016 QT has lengthened Confirmed by Samuel Jester [phone]) on 05/02/2017 7:48:02 PM        EKG Interpretation  Date/Time:  Wednesday May 02 2017 22:42:22 EST Ventricular Rate:  80 PR Interval:  158 QRS Duration: 111 QT Interval:  434 QTC Calculation: 501 R Axis:   -30 Text Interpretation:  Sinus rhythm Left axis deviation Probable anteroseptal infarct, old Prolonged QT interval Since last tracing of earlier today No significant change was found Confirmed by Samuel Jester [phone]) on 05/02/2017 10:53:33 PM        Radiology   Procedures Procedures (including critical care time)  Medications Ordered in ED Medications  pantoprazole (PROTONIX) injection 40 mg (not administered)  morphine 4 MG/ML injection 4 mg (not administered)  prochlorperazine (COMPAZINE) injection 5 mg (not administered)     Initial Impression / Assessment and Plan / ED Course  I have reviewed the triage vital signs and the nursing notes.  Pertinent labs & imaging results that were available during my care of the patient were reviewed by me and considered in my medical decision making (see chart for details).  MDM Reviewed: previous chart, nursing note and vitals Reviewed previous: labs and ECG Interpretation: labs, ECG, x-ray and CT scan   Results for orders placed or performed during the hospital encounter of 05/02/17  CBC with Differential  Result Value Ref Range   WBC 8.9 4.0 - 10.5 K/uL   RBC 3.48 (L) 4.22 - 5.81 MIL/uL   Hemoglobin 12.2 (L) 13.0 - 17.0 g/dL   HCT 62.1 (L) 30.8 - 65.7 %   MCV 105.7 (H) 78.0 - 100.0 fL   MCH 35.1 (H) 26.0 - 34.0 pg   MCHC 33.2 30.0 - 36.0  g/dL   RDW 84.6 96.2 - 95.2 %   Platelets 142 (L) 150 - 400 K/uL   Neutrophils Relative % 58 %   Neutro Abs 5.1 1.7 - 7.7 K/uL   Lymphocytes Relative 30 %   Lymphs Abs 2.7 0.7 - 4.0 K/uL   Monocytes Relative 9 %   Monocytes Absolute 0.8 0.1 - 1.0 K/uL   Eosinophils Relative 2 %   Eosinophils Absolute 0.2 0.0 - 0.7 K/uL   Basophils Relative 1 %  Basophils Absolute 0.0 0.0 - 0.1 K/uL  Comprehensive metabolic panel  Result Value Ref Range   Sodium 139 135 - 145 mmol/L   Potassium 3.2 (L) 3.5 - 5.1 mmol/L   Chloride 106 101 - 111 mmol/L   CO2 22 22 - 32 mmol/L   Glucose, Bld 105 (H) 65 - 99 mg/dL   BUN 7 6 - 20 mg/dL   Creatinine, Ser 0.98 0.61 - 1.24 mg/dL   Calcium 9.2 8.9 - 11.9 mg/dL   Total Protein 8.1 6.5 - 8.1 g/dL   Albumin 3.3 (L) 3.5 - 5.0 g/dL   AST 52 (H) 15 - 41 U/L   ALT 31 17 - 63 U/L   Alkaline Phosphatase 160 (H) 38 - 126 U/L   Total Bilirubin 2.4 (H) 0.3 - 1.2 mg/dL   GFR calc non Af Amer >60 >60 mL/min   GFR calc Af Amer >60 >60 mL/min   Anion gap 11 5 - 15  Urinalysis, Routine w reflex microscopic  Result Value Ref Range   Color, Urine YELLOW YELLOW   APPearance CLEAR CLEAR   Specific Gravity, Urine 1.006 1.005 - 1.030   pH 7.0 5.0 - 8.0   Glucose, UA NEGATIVE NEGATIVE mg/dL   Hgb urine dipstick NEGATIVE NEGATIVE   Bilirubin Urine NEGATIVE NEGATIVE   Ketones, ur NEGATIVE NEGATIVE mg/dL   Protein, ur NEGATIVE NEGATIVE mg/dL   Nitrite NEGATIVE NEGATIVE   Leukocytes, UA NEGATIVE NEGATIVE  Lipase, blood  Result Value Ref Range   Lipase 46 11 - 51 U/L  Troponin I  Result Value Ref Range   Troponin I <0.03 <0.03 ng/mL  Magnesium  Result Value Ref Range   Magnesium 1.9 1.7 - 2.4 mg/dL   Dg Chest 2 View Result Date: 05/02/2017 CLINICAL DATA:  Left-sided abdominal pain for several days EXAM: CHEST  2 VIEW COMPARISON:  10/24/2016 FINDINGS: Cardiac shadow is within normal limits. The lungs are well aerated bilaterally. Mild apical scarring is seen. No  focal infiltrate or sizable effusion is noted. Chronic compression deformities are noted in the midthoracic spine stable from the prior study. IMPRESSION: No acute abnormality noted. Electronically Signed   By: Alcide Clever M.D.   On: 05/02/2017 21:14   Ct Head Wo Contrast Result Date: 05/02/2017 CLINICAL DATA:  Chronic headache. EXAM: CT HEAD WITHOUT CONTRAST TECHNIQUE: Contiguous axial images were obtained from the base of the skull through the vertex without intravenous contrast. COMPARISON:  None. FINDINGS: Brain: Diffusely enlarged ventricles and subarachnoid spaces. No intracranial hemorrhage, mass lesion or CT evidence of acute infarction. Vascular: No hyperdense vessel or unexpected calcification. Skull: Normal. Negative for fracture or focal lesion. Sinuses/Orbits: Left superolateral orbital wall and orbital floor fixation screws with an old blowout fracture of the orbital floor with associated soft tissue density in the superior aspect of the left maxillary sinus. Other: None. IMPRESSION: 1. No acute abnormality. 2. Mild diffuse cerebral atrophy. 3. Old left orbital trauma and hardware fixation. Electronically Signed   By: Beckie Salts M.D.   On: 05/02/2017 21:06   Ct Abdomen Pelvis W Contrast Result Date: 05/02/2017 CLINICAL DATA:  Left abdominal pain. EXAM: CT ABDOMEN AND PELVIS WITH CONTRAST TECHNIQUE: Multidetector CT imaging of the abdomen and pelvis was performed using the standard protocol following bolus administration of intravenous contrast. CONTRAST:  ISOVUE-300 IOPAMIDOL (ISOVUE-300) INJECTION 61% COMPARISON:  CT 10/24/2016 FINDINGS: Lower chest: Dependent atelectasis in the lung bases. Heart is upper limits normal in size. No effusions. Hepatobiliary: Mildly nodular contours  of the liver compatible with cirrhosis. No focal hepatic abnormality. Gallbladder unremarkable. Pancreas: No focal abnormality or ductal dilatation. Spleen: No focal abnormality.  Normal size. Adrenals/Urinary  Tract: No adrenal abnormality. No focal renal abnormality. No stones or hydronephrosis. Urinary bladder is unremarkable. Stomach/Bowel: Appendix is normal. Diffuse colonic diverticulosis. Stranding noted in the right paracolic gutter and along the ascending colon, likely acute diverticulitis. Stomach and small bowel decompressed, unremarkable. Vascular/Lymphatic: No evidence of aneurysm or adenopathy. Reproductive: Central prostate calcifications. Other: No free fluid or free air. Musculoskeletal: No acute bony abnormality. IMPRESSION: Diffuse colonic diverticulosis. Stranding along the ascending colon and in the right paracolic gutter, likely acute diverticulitis. Changes of cirrhosis. Bibasilar dependent atelectasis. Electronically Signed   By: Charlett NoseKevin  Dover M.D.   On: 05/02/2017 21:07    2230:  CT with likely diverticulitis. Pt has been ambulatory with steady gait, easy resps, NAD. Pt has tol PO well while in the ED without N/V. No stooling while in the ED. Abd benign, VSS. Remains afebrile. Potassium repleted PO. 1st dose abx given in the ED. Pt states he feels better and is ready to go home now. Dx and testing d/w pt and family.  Questions answered.  Verb understanding, agreeable to d/c home with outpt f/u.     Final Clinical Impressions(s) / ED Diagnoses   Final diagnoses:  None    ED Discharge Orders    None       Samuel JesterMcManus, Zohal Reny, DO 05/05/17 1101

## 2017-05-02 NOTE — ED Notes (Signed)
Pt ambulatory to waiting room. Pt verbalized understanding of discharge instructions.   

## 2017-05-07 ENCOUNTER — Telehealth: Payer: Self-pay

## 2017-05-07 NOTE — Telephone Encounter (Signed)
Refill Request for POT Chloride tab received from [facility] . Take 2 po twice daily for 2 says/.

## 2017-05-08 ENCOUNTER — Ambulatory Visit (INDEPENDENT_AMBULATORY_CARE_PROVIDER_SITE_OTHER): Payer: Medicare HMO | Admitting: Family Medicine

## 2017-05-08 ENCOUNTER — Encounter: Payer: Self-pay | Admitting: Family Medicine

## 2017-05-08 VITALS — BP 108/46 | HR 64 | Temp 98.2°F | Resp 16 | Ht 74.0 in | Wt 242.0 lb

## 2017-05-08 DIAGNOSIS — R69 Illness, unspecified: Secondary | ICD-10-CM | POA: Diagnosis not present

## 2017-05-08 DIAGNOSIS — F3341 Major depressive disorder, recurrent, in partial remission: Secondary | ICD-10-CM

## 2017-05-08 DIAGNOSIS — N529 Male erectile dysfunction, unspecified: Secondary | ICD-10-CM | POA: Diagnosis not present

## 2017-05-08 DIAGNOSIS — F310 Bipolar disorder, current episode hypomanic: Secondary | ICD-10-CM | POA: Diagnosis not present

## 2017-05-08 DIAGNOSIS — J3089 Other allergic rhinitis: Secondary | ICD-10-CM

## 2017-05-08 DIAGNOSIS — R519 Headache, unspecified: Secondary | ICD-10-CM

## 2017-05-08 DIAGNOSIS — F32A Depression, unspecified: Secondary | ICD-10-CM | POA: Insufficient documentation

## 2017-05-08 DIAGNOSIS — K766 Portal hypertension: Secondary | ICD-10-CM

## 2017-05-08 DIAGNOSIS — I10 Essential (primary) hypertension: Secondary | ICD-10-CM

## 2017-05-08 DIAGNOSIS — K7030 Alcoholic cirrhosis of liver without ascites: Secondary | ICD-10-CM

## 2017-05-08 DIAGNOSIS — R51 Headache: Secondary | ICD-10-CM

## 2017-05-08 DIAGNOSIS — F4310 Post-traumatic stress disorder, unspecified: Secondary | ICD-10-CM

## 2017-05-08 DIAGNOSIS — F329 Major depressive disorder, single episode, unspecified: Secondary | ICD-10-CM | POA: Insufficient documentation

## 2017-05-08 MED ORDER — FLUTICASONE PROPIONATE 50 MCG/ACT NA SUSP: 2.0000 | Freq: Every day | NASAL | 3 refills | Status: DC

## 2017-05-08 MED ORDER — GABAPENTIN 100 MG PO CAPS: ORAL_CAPSULE | ORAL | 1 refills | Status: DC

## 2017-05-08 MED ORDER — AMLODIPINE BESYLATE 5 MG PO TABS
5.0000 mg | ORAL_TABLET | Freq: Every day | ORAL | 5 refills | Status: DC
Start: 2017-05-08 — End: 2017-11-08

## 2017-05-08 MED ORDER — SILDENAFIL CITRATE 20 MG PO TABS: ORAL_TABLET | ORAL | 5 refills | Status: AC

## 2017-05-08 NOTE — Progress Notes (Signed)
Subjective:    Patient ID: Christian Sanders, male    DOB: [birth year], 57 y.o.   MRN: [id number]  Christian Sanders is a 57 y.o. male presenting on 05/08/2017 for Hypertension  Additional history provided by significant other, Christian Sanders.  HPI   Chronic Headaches / Alcoholic Cirrhosis Followed by [facility] ([place]) Last seen 03/2017, has had labs and monitoring, last reported MELD score 15 and child Pugh B, due for repeat EGD - He has concerns that he cannot take NSAIDs or goody powder as he used to take for headaches - Now has almost daily headache mostly L sided and sometimes radiate to back of neck - Admits some visual symptoms, no aura - Denies nausea vomiting, numbness tingling weakness  CHRONIC HTN: Reports no new concerns. Mild low BP now. Current Meds - Lisinopril 40mg  daily, Amlodipine 5mg  daily (reduced from 10 in past) Reports good compliance, took meds today. Tolerating well, w/o complaints. - Reduced smoking down to 4 cigs daily. Quit drinking. Denies CP, dyspnea, edema, dizziness / lightheadedness  Bipolar Depression / PTSD Chronic history of mental health problems and substance abuse, alcohol. Was followed by [facility] in past. Now they are no longer covered by [employer] now has copay $80 and unable to afford Treated in past with Xanax 0.5mg  BID and Latuda 40mg  daily Request new referral to Psychiatry, interested to go closer to [place] Denies worsening mood, anxiety, mania   Depression screen [facility] 2/9 05/08/2017 06/03/2015 02/09/2015  Decreased Interest 0 0 2  Down, Depressed, Hopeless 0 0 2  PHQ - 2 Score 0 0 4  Altered sleeping 0 - 3  Tired, decreased energy 1 - 0  Change in appetite 0 - 0  Feeling bad or failure about yourself  0 - 0  Trouble concentrating 1 - 0  Moving slowly or fidgety/restless 0 - 0  Suicidal thoughts 0 - 0  PHQ-9 Score 2 - 7  Difficult doing work/chores Not difficult at all - -   No flowsheet data found.  Social History   Tobacco Use    . Smoking status: Current Every Day Smoker    Packs/day: 0.25    Years: 30.00    Pack years: 7.50    Types: Cigarettes  . Smokeless tobacco: Never Used  Substance Use Topics  . Alcohol use: No    Alcohol/week: 3.6 oz    Types: 6 Cans of beer per week    Frequency: Never    Comment: None as of 03/08/17; previously 1-1.5 cases of beer a day in his 65s.  . Drug use: No    Review of Systems Per HPI unless specifically indicated above     Objective:    BP (!) 108/46   Pulse 64   Temp 98.2 F (36.8 C) (Oral)   Resp 16   Ht 6\' 2"  (1.88 m)   Wt 242 lb (109.8 kg)   BMI 31.07 kg/m   Wt Readings from Last 3 Encounters:  05/08/17 242 lb (109.8 kg)  05/02/17 240 lb (108.9 kg)  04/17/17 240 lb (108.9 kg)    Physical Exam  Constitutional: He is oriented to person, place, and time. He appears well-developed and well-nourished. No distress.  Well-appearing, comfortable, cooperative  HENT:  Head: Normocephalic and atraumatic.  Mouth/Throat: Oropharynx is clear and moist.  Eyes: Conjunctivae are normal. Right eye exhibits no discharge. Left eye exhibits no discharge.  Neck: Normal range of motion. Neck supple.  Cardiovascular: Normal rate, regular rhythm, normal heart sounds and  intact distal pulses.  No murmur heard. Pulmonary/Chest: Effort normal and breath sounds normal. No respiratory distress. He has no wheezes. He has no rales.  Musculoskeletal: Normal range of motion. He exhibits no edema.  Neurological: He is alert and oriented to person, place, and time.  Skin: Skin is warm and dry. No rash noted. He is not diaphoretic. No erythema.  Psychiatric: He has a normal mood and affect. His behavior is normal.  Well groomed, good eye contact, normal speech and thoughts  Nursing note and vitals reviewed.  Results for orders placed or performed during the hospital encounter of 05/02/17  CBC with Differential  Result Value Ref Range   WBC 8.9 4.0 - 10.5 K/uL   RBC 3.48 (L) 4.22 -  5.81 MIL/uL   Hemoglobin 12.2 (L) 13.0 - 17.0 g/dL   HCT 16.1 (L) 09.6 - 04.5 %   MCV 105.7 (H) 78.0 - 100.0 fL   MCH 35.1 (H) 26.0 - 34.0 pg   MCHC 33.2 30.0 - 36.0 g/dL   RDW 40.9 81.1 - 91.4 %   Platelets 142 (L) 150 - 400 K/uL   Neutrophils Relative % 58 %   Neutro Abs 5.1 1.7 - 7.7 K/uL   Lymphocytes Relative 30 %   Lymphs Abs 2.7 0.7 - 4.0 K/uL   Monocytes Relative 9 %   Monocytes Absolute 0.8 0.1 - 1.0 K/uL   Eosinophils Relative 2 %   Eosinophils Absolute 0.2 0.0 - 0.7 K/uL   Basophils Relative 1 %   Basophils Absolute 0.0 0.0 - 0.1 K/uL  Comprehensive metabolic panel  Result Value Ref Range   Sodium 139 135 - 145 mmol/L   Potassium 3.2 (L) 3.5 - 5.1 mmol/L   Chloride 106 101 - 111 mmol/L   CO2 22 22 - 32 mmol/L   Glucose, Bld 105 (H) 65 - 99 mg/dL   BUN 7 6 - 20 mg/dL   Creatinine, Ser 7.82 0.61 - 1.24 mg/dL   Calcium 9.2 8.9 - 95.6 mg/dL   Total Protein 8.1 6.5 - 8.1 g/dL   Albumin 3.3 (L) 3.5 - 5.0 g/dL   AST 52 (H) 15 - 41 U/L   ALT 31 17 - 63 U/L   Alkaline Phosphatase 160 (H) 38 - 126 U/L   Total Bilirubin 2.4 (H) 0.3 - 1.2 mg/dL   GFR calc non Af Amer >60 >60 mL/min   GFR calc Af Amer >60 >60 mL/min   Anion gap 11 5 - 15  Urinalysis, Routine w reflex microscopic  Result Value Ref Range   Color, Urine YELLOW YELLOW   APPearance CLEAR CLEAR   Specific Gravity, Urine 1.006 1.005 - 1.030   pH 7.0 5.0 - 8.0   Glucose, UA NEGATIVE NEGATIVE mg/dL   Hgb urine dipstick NEGATIVE NEGATIVE   Bilirubin Urine NEGATIVE NEGATIVE   Ketones, ur NEGATIVE NEGATIVE mg/dL   Protein, ur NEGATIVE NEGATIVE mg/dL   Nitrite NEGATIVE NEGATIVE   Leukocytes, UA NEGATIVE NEGATIVE  Lipase, blood  Result Value Ref Range   Lipase 46 11 - 51 U/L  Troponin I  Result Value Ref Range   Troponin I <0.03 <0.03 ng/mL  Magnesium  Result Value Ref Range   Magnesium 1.9 1.7 - 2.4 mg/dL      Assessment & Plan:   Problem List Items Addressed This Visit    Bipolar disorder (HCC)     Stable, in remission Depressive symptoms primarily PTSD  Referral to [facility] - needs to reestablish  [facility] (d/t cost copay ins [place]), request [place] location - Defer rx Latuda/Xanax at this time      Relevant Orders   Ambulatory referral to Psychiatry   Chronic headaches    Possible migraine vs other chronic headache syndrome Prior NSAIDs was primary treatment Limited other options now d/t cirrhosis Trial on Gabapentin low dose titrate up as tolerated 100 nightly then up to 100 TID vs 300 at night, adjust further if need      Relevant Medications   amLODipine (NORVASC) 5 MG tablet   gabapentin (NEURONTIN) 100 MG capsule   Depression   Relevant Orders   Ambulatory referral to Psychiatry   ED (erectile dysfunction)    Refill Sildenafil      Relevant Medications   sildenafil (REVATIO) 20 MG tablet   Hepatic cirrhosis (HCC)    Stable chronic alcoholic cirrhosis [facility] [place] MELD 15, Child Pugh B Portal HTN on EGD Follow-up      Hypertension - Primary    Improved BP control, some lower BP Complicaton Cirrhosis  Plan Reduce Amlodipine 10 to 5mg  new rx sent Continue Lisinopril 40mg  Off BB at this time by his report Monitor BP Follow-up      Relevant Medications   amLODipine (NORVASC) 5 MG tablet   sildenafil (REVATIO) 20 MG tablet   Portal hypertension (HCC)    Secondary to Alcohol Cirrhosis Identified on EGD [facility]ed [facility] [place]      Relevant Medications   amLODipine (NORVASC) 5 MG tablet   sildenafil (REVATIO) 20 MG tablet   PTSD (post-traumatic stress disorder)    Chronic problem Co-morbid with Bipolar, Depression [facility], request [place] location - Defer rx Latuda/Xanax at this time      Relevant Orders   Ambulatory referral to Psychiatry    Other Visit Diagnoses    Non-seasonal allergic rhinitis due to other  allergic trigger       Relevant Medications   fluticasone (FLONASE) 50 MCG/ACT nasal spray      Meds ordered this encounter  Medications  . amLODipine (NORVASC) 5 MG tablet    Sig: Take 1 tablet (5 mg total) by mouth daily.    Dispense:  30 tablet    Refill:  5    Reduced dose from 10 to 5  . fluticasone (FLONASE) 50 MCG/ACT nasal spray    Sig: Place 2 sprays into both nostrils daily. Use for 4-6 weeks then stop and use seasonally or as needed.    Dispense:  16 g    Refill:  3  . gabapentin (NEURONTIN) 100 MG capsule    Sig: Start 1 capsule daily, increase by 1 cap every 2-3 days as tolerated up to 3 times a day, or may take 3 at once in evening.    Dispense:  90 capsule    Refill:  1  . sildenafil (REVATIO) 20 MG tablet    Sig: TAKE 1 TO 5 TABLETS BY MOUTH ONCE DAILY AS NEEDED    Dispense:  50 tablet    Refill:  5     Follow up plan: Return in about 3 months (around 08/05/2017) for HTN med adjust, Headaches / Psych f/u.  Saralyn PilarAlexander Gailya Tauer, DO [facility] 05/09/2017, 1:54 AM

## 2017-05-08 NOTE — Telephone Encounter (Signed)
Lmom, pt notified to contact his pcp.

## 2017-05-08 NOTE — Telephone Encounter (Signed)
The only KCl I can find in his record was given/written by the ER provider. We do not usually manage routine KCL. He should contact his PCP for labs and continued KCl if needed.

## 2017-05-08 NOTE — Patient Instructions (Addendum)
Thank you for coming to the office today.  1. For headaches Start Gabapentin 100mg  capsules, take at night for 2-3 nights only, and then increase to 2 times a day for a few days, and then may increase to 3 times a day, it may make you drowsy, if helps significantly at night only, then you can increase instead to 3 capsules at night, instead of 3 times a day - In the future if needed, we can significantly increase the dose if tolerated well, some common doses are 300mg  three times a day up to 600mg  three times a day, usually it takes several weeks or months to get to higher doses  Refilled Flonase  Lower dose Amlodipine from 10 to 5mg  daily   PSYCHIATRY  Referral [facility] to [facility] - stay tuned for apt  [facility] [place]  Address: [address], [place], [place] [address]  Phone: [phone]  (back up plan may be [facility])  [facility] [address] Ground Floor [place], [place] [address] Phone: [phone]  IF CANNOT GET ESTABLISHED WITH [facility] THEN CONSIDER THE FOLLOWING:  [facility], available walk-in 9am-4pm M-F [address] [place], [place] [address] Hours: 9am - 4pm (M-F, walk in available) [phone]  [facility]   Address: [address], [place], [place] [address] Hours: 8AM-5PM (accepts walk in to establish) Phone: [phone]  -------------------------------------------------  Dr Naomie DeanAntonia Ahern (Migraine Specialist)  [facility]   Address: [address], [place], [place] [address] Hours: 8AM-5PM Phone: [phone]  ----------------------------------------------------------- NEUROCOGNITIVE (NEUROLOGY)  Indicated for: - Differentiate psychogenic vs neurogenic (depression vs dementia) - Normal aging, MCI, vs dementia >> and cause of dementia - Cognitive changes > due to medical vs treatment vs  psych - Education to family / treatment recs to provider (NOT to manage) - Abnormal cognitive screening (MOCA, MMSE), memory changes  [facility] [address] [place], [place] [address] Phone: [phone]   Please schedule a Follow-up Appointment to: Return in about 3 months (around 08/05/2017) for HTN med adjust, Headaches / Psych f/u.    If you have any other questions or concerns, please feel free to call the office or send a message through MyChart. You may also schedule an earlier appointment if necessary.  Additionally, you may be receiving a survey about your experience at our office within a few days to 1 week by e-mail or mail. We value your feedback.  Saralyn PilarAlexander Karamalegos, DO [facility]

## 2017-05-09 NOTE — Assessment & Plan Note (Signed)
Stable chronic alcoholic cirrhosis [facility]ed [facility], Child Pugh B Portal HTN on EGD Follow-up

## 2017-05-09 NOTE — Assessment & Plan Note (Addendum)
Stable, in remission Depressive symptoms primarily PTSD  [facility]erral to [facility] - needs to re[facility] no longer at [facility] (d/t cost copay ins chagne), request [place] location - Defer rx Latuda/Xanax at this time

## 2017-05-09 NOTE — Assessment & Plan Note (Signed)
Chronic problem Co-morbid with Bipolar, Depression [facility] - [facility], request [place] location - Defer rx Latuda/Xanax at this time

## 2017-05-09 NOTE — Assessment & Plan Note (Signed)
Improved BP control, some lower BP Complicaton Cirrhosis  Plan Reduce Amlodipine 10 to 5mg  new rx sent Continue Lisinopril 40mg  Off BB at this time by his report Monitor BP Follow-up

## 2017-05-09 NOTE — Assessment & Plan Note (Signed)
Secondary to Alcohol Cirrhosis Identified on EGD Followed by [facility] [place]

## 2017-05-09 NOTE — Assessment & Plan Note (Signed)
Possible migraine vs other chronic headache syndrome Prior NSAIDs was primary treatment Limited other options now d/t cirrhosis Trial on Gabapentin low dose titrate up as tolerated 100 nightly then up to 100 TID vs 300 at night, adjust further if need

## 2017-05-09 NOTE — Assessment & Plan Note (Signed)
Refill Sildenafil  

## 2017-05-11 ENCOUNTER — Encounter (HOSPITAL_COMMUNITY): Payer: Self-pay | Admitting: Emergency Medicine

## 2017-05-11 ENCOUNTER — Other Ambulatory Visit: Payer: Self-pay

## 2017-05-11 ENCOUNTER — Emergency Department (HOSPITAL_COMMUNITY)
Admission: EM | Admit: 2017-05-11 | Discharge: 2017-05-11 | Disposition: A | Discharge status: Home / Self Care | Payer: Medicare HMO | Attending: Emergency Medicine | Admitting: Emergency Medicine

## 2017-05-11 ENCOUNTER — Emergency Department (HOSPITAL_COMMUNITY): Payer: Medicare HMO

## 2017-05-11 DIAGNOSIS — S4992XA Unspecified injury of left shoulder and upper arm, initial encounter: Secondary | ICD-10-CM | POA: Diagnosis not present

## 2017-05-11 DIAGNOSIS — Y929 Unspecified place or not applicable: Secondary | ICD-10-CM | POA: Insufficient documentation

## 2017-05-11 DIAGNOSIS — F17210 Nicotine dependence, cigarettes, uncomplicated: Secondary | ICD-10-CM | POA: Diagnosis not present

## 2017-05-11 DIAGNOSIS — Y999 Unspecified external cause status: Secondary | ICD-10-CM | POA: Diagnosis not present

## 2017-05-11 DIAGNOSIS — Z79899 Other long term (current) drug therapy: Secondary | ICD-10-CM | POA: Insufficient documentation

## 2017-05-11 DIAGNOSIS — Y9389 Activity, other specified: Secondary | ICD-10-CM | POA: Insufficient documentation

## 2017-05-11 DIAGNOSIS — M25512 Pain in left shoulder: Secondary | ICD-10-CM | POA: Diagnosis not present

## 2017-05-11 DIAGNOSIS — W109XXA Fall (on) (from) unspecified stairs and steps, initial encounter: Secondary | ICD-10-CM | POA: Insufficient documentation

## 2017-05-11 DIAGNOSIS — R69 Illness, unspecified: Secondary | ICD-10-CM | POA: Diagnosis not present

## 2017-05-11 DIAGNOSIS — I10 Essential (primary) hypertension: Secondary | ICD-10-CM | POA: Insufficient documentation

## 2017-05-11 MED ORDER — DICLOFENAC SODIUM 1 % TD GEL: TRANSDERMAL | 1 refills | Status: DC

## 2017-05-11 MED ORDER — TRAMADOL HCL 50 MG PO TABS: 50.0000 mg | ORAL_TABLET | Freq: Four times a day (QID) | ORAL | 0 refills | Status: DC | PRN

## 2017-05-11 MED ORDER — TRAMADOL HCL 50 MG PO TABS
100.0000 mg | ORAL_TABLET | Freq: Once | ORAL | Status: AC
  Administered 2017-05-11: 100 mg via ORAL
  Filled 2017-05-11: qty 2

## 2017-05-11 MED ORDER — ONDANSETRON HCL 4 MG PO TABS
4.0000 mg | ORAL_TABLET | Freq: Once | ORAL | Status: AC
  Administered 2017-05-11: 4 mg via ORAL
  Filled 2017-05-11: qty 1

## 2017-05-11 NOTE — ED Provider Notes (Signed)
[facility] EMERGENCY DEPARTMENT Provider Note   CSN: [id number] Arrival date & time: 05/11/17  1015     History   Chief Complaint Chief Complaint  Patient presents with  . Shoulder Injury    HPI Christian Sanders is a 57 y.o. male.  Pt reports falling down stairs and injuring left shoulder.   The history is provided by the patient.  Shoulder Injury  This is a new problem. The current episode started more than 1 week ago. The problem occurs daily. The problem has been gradually worsening. Pertinent negatives include no chest pain, no abdominal pain and no shortness of breath. Exacerbated by: raising arm. Nothing relieves the symptoms. He has tried nothing for the symptoms.    Past Medical History:  Diagnosis Date  . Abnormal CT of liver 12/20/2016  . Anxiety   . Bipolar disorder (HCC)   . Dentures complicating chewing    full upper, partial lower  . Depression   . Esophageal varices determined by endoscopy (HCC)   . Gastritis and duodenitis   . GERD (gastroesophageal reflux disease)   . Headache    chronic, s/p facial injury and reconstruction  . Hypertension   . Portal hypertensive gastropathy (HCC)   . PTSD (post-traumatic stress disorder)   . Vertebral column disorder    2 crushed thoracic vertebra    Patient Active Problem List   Diagnosis Date Noted  . Depression 05/08/2017  . Varices of esophagus determined by endoscopy (HCC)   . Gastritis and duodenitis   . Splenomegaly 03/08/2017  . Hepatic cirrhosis (HCC) 12/27/2016  . RUQ pain 12/27/2016  . Portal hypertension (HCC) 12/27/2016  . Elevated liver enzymes 03/02/2016  . BPH without urinary obstruction 02/28/2016  . ED (erectile dysfunction) 12/28/2015  . SK (seborrheic keratosis) 12/28/2015  . Chronic headaches 02/09/2015  . Tobacco abuse 02/09/2015  . Hypertension 11/23/2014  . Bipolar disorder (HCC) 11/23/2014  . PTSD (post-traumatic stress disorder) 11/23/2014  . Special screening for malignant  neoplasms, colon   . Chronic mid back pain 12/16/2013  . Closed compression fracture of thoracic vertebra (HCC) 09/05/2013    Past Surgical History:  Procedure Laterality Date  . BIOPSY  04/17/2017   Procedure: BIOPSY;  Surgeon: West Bali, MD;  [facility];  Service: Endoscopy;;  gastric for h pylori  . COLONOSCOPY WITH PROPOFOL N/A 10/09/2014   Procedure: COLONOSCOPY WITH PROPOFOL;  Surgeon: Midge Minium, MD;  [facility];  Service: Endoscopy;  Laterality: N/A;  . ESOPHAGEAL BANDING N/A 04/17/2017   Procedure: ESOPHAGEAL BANDING;  Surgeon: West Bali, MD;  [facility]: [facility];  Service: Endoscopy;  Laterality: N/A;  . ESOPHAGOGASTRODUODENOSCOPY (EGD) WITH PROPOFOL N/A 04/17/2017   Procedure: ESOPHAGOGASTRODUODENOSCOPY (EGD) WITH PROPOFOL;  Surgeon: West Bali, MD;  Location: [facility];  Service: Endoscopy;  Laterality: N/A;  10:45am  . FACIAL RECONSTRUCTION SURGERY     eye socket with 2 metal plates       Home Medications    Prior to Admission medications   Medication Sig Start Date End Date Taking? Authorizing Provider  ALPRAZolam Prudy Feeler) 0.5 MG tablet Take 0.5 mg by mouth 2 (two) times daily.  12/15/15   [provider]  amLODipine (NORVASC) 5 MG tablet Take 1 tablet (5 mg total) by mouth daily. 05/08/17   Karamalegos, Netta Neat, DO  amoxicillin-clavulanate (AUGMENTIN) 875-125 MG tablet Take 1 tablet by mouth every 12 (twelve) hours. 05/02/17   Samuel Jester, DO  fluticasone (FLONASE) 50  MCG/ACT nasal spray Place 2 sprays into both nostrils daily. Use for 4-6 weeks then stop and use seasonally or as needed. 05/08/17   Karamalegos, Netta NeatAlexander J, DO  gabapentin (NEURONTIN) 100 MG capsule Start 1 capsule daily, increase by 1 cap every 2-3 days as tolerated up to 3 times a day, or may take 3 at once in evening. 05/08/17   Karamalegos, Alexander J, DO  LATUDA 40 MG TABS tablet TAKE 1 TABLET BY MOUTH AT NIGHT WITH A MEAL 04/05/17    [provider]  lidocaine (XYLOCAINE) 2 % solution 2 TSP PO Q4H IF NEEDED. USE 30 MINS PRIOR TO MEALS AND PRN FOR ABDOMINAL/CHEST PAIN.  NO MORE> 8 DOSES/DAY. 04/17/17   Fields, Darleene CleaverSandi L, MD  lisinopril (PRINIVIL,ZESTRIL) 40 MG tablet TAKE 1 TABLET BY MOUTH ONCE EVERY MORNING 06/12/16   Janeann ForehandHawkins, James H Jr., MD  omeprazole (PRILOSEC) 20 MG capsule 1 PO 30 MINS PRIOR TO FIRST MEAL Patient taking differently: Take 20 mg by mouth every morning. 1 PO 30 MINS PRIOR TO FIRST MEAL 04/17/17   Fields, Darleene CleaverSandi L, MD  prochlorperazine (COMPAZINE) 5 MG tablet Take 1 tablet (5 mg total) by mouth 2 (two) times daily as needed for nausea or vomiting (or headache). 05/02/17   Samuel JesterMcManus, Kathleen, DO  sildenafil (REVATIO) 20 MG tablet TAKE 1 TO 5 TABLETS BY MOUTH ONCE DAILY AS NEEDED 05/08/17   Smitty CordsKaramalegos, Alexander J, DO    Family History Family History  Problem Relation Age of Onset  . Colon cancer Neg Hx   . Liver disease Neg Hx     Social History Social History   Tobacco Use  . Smoking status: Current Every Day Smoker    Packs/day: 0.25    Years: 30.00    Pack years: 7.50    Types: Cigarettes  . Smokeless tobacco: Never Used  Substance Use Topics  . Alcohol use: No    Alcohol/week: 3.6 oz    Types: 6 Cans of beer per week    Frequency: Never    Comment: None as of 03/08/17; previously 1-1.5 cases of beer a day in his 6520s.  . Drug use: No     Allergies   Bee venom; Ibuprofen; Pineapple; Pollen extract; and Tylenol [acetaminophen]   Review of Systems Review of Systems  Constitutional: Negative for activity change.       All ROS Neg except as noted in HPI  HENT: Negative for nosebleeds.   Eyes: Negative for photophobia and discharge.  Respiratory: Negative for cough, shortness of breath and wheezing.   Cardiovascular: Negative for chest pain and palpitations.  Gastrointestinal: Negative for abdominal pain and blood in stool.  Genitourinary: Negative for dysuria, frequency and  hematuria.  Musculoskeletal: Positive for arthralgias. Negative for back pain and neck pain.       Shoulder pain  Skin: Negative.   Neurological: Negative for dizziness, seizures and speech difficulty.  Psychiatric/Behavioral: Negative for confusion and hallucinations.     Physical Exam Updated Vital Signs BP (!) 145/96   Pulse 78   Temp 98.1 F (36.7 C)   Resp 20   SpO2 100%   Physical Exam  Constitutional: He is oriented to person, place, and time. He appears well-developed and well-nourished.  Non-toxic appearance.  HENT:  Head: Normocephalic.  Right Ear: Tympanic membrane and external ear normal.  Left Ear: Tympanic membrane and external ear normal.  Eyes: EOM and lids are normal. Pupils are equal, round, and reactive to light.  Neck: Normal range  of motion. Neck supple. Carotid bruit is not present.  Cardiovascular: Normal rate, regular rhythm, normal heart sounds, intact distal pulses and normal pulses.  Pulmonary/Chest: Breath sounds normal. No respiratory distress.  Abdominal: Soft. Bowel sounds are normal. There is no tenderness. There is no guarding.  Musculoskeletal:       Left shoulder: He exhibits decreased range of motion and pain. He exhibits no deformity.       Arms: Lymphadenopathy:       Head (right side): No submandibular adenopathy present.       Head (left side): No submandibular adenopathy present.    He has no cervical adenopathy.  Neurological: He is alert and oriented to person, place, and time. No cranial nerve deficit or sensory deficit.  Pain with with attempted ROM. Can not measure strength do to cooperation.  Skin: Skin is warm and dry.  Psychiatric: He has a normal mood and affect. His speech is normal.  Nursing note and vitals reviewed.    ED Treatments / Results  Labs (all labs ordered are listed, but only abnormal results are displayed) Labs Reviewed - No data to display  EKG  EKG Interpretation None       Radiology Dg  Shoulder Left  Result Date: 05/11/2017 CLINICAL DATA:  Pt states he fell x 1 week ago/pain left shoulder EXAM: LEFT SHOULDER - 2+ VIEW COMPARISON:  None. FINDINGS: There is no evidence of fracture or dislocation. There is no evidence of arthropathy or other focal bone abnormality. Soft tissues are unremarkable. IMPRESSION: Negative. Electronically Signed   By: Bary Richard M.D.   On: 05/11/2017 11:25    Procedures Procedures (including critical care time)  Medications Ordered in ED Medications - No data to display   Initial Impression / Assessment and Plan / ED Course  I have reviewed the triage vital signs and the nursing notes.  Pertinent labs & imaging results that were available during my care of the patient were reviewed by me and considered in my medical decision making (see chart for details).       Final Clinical Impressions(s) / ED Diagnoses MDM  Patient fell and injured the left shoulder approximately a week ago.  He continues to have pain.  There are no gross neurologic deficits appreciated, no vascular deficits noted.  There is no deformity noted of the shoulder at this time.  X-ray of the shoulder is negative for acute changes or problems.  I have asked the patient to use a sling.  I have referred him to orthopedics for further evaluation of his shoulder, in particular evaluation of possible rotator cuff injury.  Patient is in agreement with this plan.   Final diagnoses:  Injury of left shoulder, initial encounter    ED Discharge Orders        Ordered    diclofenac sodium (VOLTAREN) 1 % GEL  Status:  Discontinued     05/11/17 1342    traMADol (ULTRAM) 50 MG tablet  Every 6 hours PRN,   Status:  Discontinued     05/11/17 1342    diclofenac sodium (VOLTAREN) 1 % GEL     05/11/17 1351    traMADol (ULTRAM) 50 MG tablet  Every 6 hours PRN     05/11/17 1351       Ivery Quale, PA-C 05/11/17 1647    Long, Arlyss Repress, MD 05/12/17 [phone]

## 2017-05-11 NOTE — ED Triage Notes (Signed)
Pt tripped and fell down the stairs and hit left shoulder last week. Pain with movement.

## 2017-05-11 NOTE — Discharge Instructions (Signed)
Your blood pressure is slightly elevated at 145/96, your vital signs are otherwise within normal limits.  Please have your blood pressure rechecked by your primary physician soon.  Your x-ray is negative for fracture or dislocation.  There are no no acute nerve or neurologic changes, and there are no vascular changes involving your left upper extremity.  There is some concern for muscle strain, and/or rotator cuff injury.  Please use your sling until seen by the orthopedic specialist.  Please use the diclofenac gel 3 times daily.  Starting tomorrow you may use the Ultram every 6 hours if needed for severe pain.

## 2017-05-11 NOTE — ED Notes (Signed)
Pt denies taking anything at home, unable to take tylenol or ibuprofen due to liver cirrhosis

## 2017-05-11 NOTE — ED Notes (Signed)
Pt returned from xray

## 2017-05-16 ENCOUNTER — Encounter (HOSPITAL_COMMUNITY): Payer: Self-pay

## 2017-05-16 ENCOUNTER — Encounter (HOSPITAL_COMMUNITY)
Admission: RE | Admit: 2017-05-16 | Discharge: 2017-05-16 | Disposition: A | Discharge status: Home / Self Care | Payer: Medicare HMO | Source: Ambulatory Visit | Attending: Gastroenterology | Admitting: Gastroenterology

## 2017-05-16 ENCOUNTER — Other Ambulatory Visit: Payer: Self-pay

## 2017-05-16 ENCOUNTER — Other Ambulatory Visit (HOSPITAL_COMMUNITY)
Admission: RE | Admit: 2017-05-16 | Discharge: 2017-05-16 | Disposition: A | Discharge status: Home / Self Care | Payer: Medicare HMO | Source: Ambulatory Visit | Attending: Gastroenterology | Admitting: Gastroenterology

## 2017-05-16 DIAGNOSIS — B182 Chronic viral hepatitis C: Secondary | ICD-10-CM | POA: Diagnosis present

## 2017-05-16 DIAGNOSIS — Z01812 Encounter for preprocedural laboratory examination: Secondary | ICD-10-CM | POA: Insufficient documentation

## 2017-05-16 LAB — BASIC METABOLIC PANEL
ANION GAP: 9 (ref 5–15)
BUN: 9 mg/dL (ref 6–20)
CALCIUM: 9.1 mg/dL (ref 8.9–10.3)
CO2: 22 mmol/L (ref 22–32)
CREATININE: 0.65 mg/dL (ref 0.61–1.24)
Chloride: 107 mmol/L (ref 101–111)
GFR calc Af Amer: >60 mL/min (ref >60)
GFR calc non Af Amer: >60 mL/min (ref >60)
Glucose, Bld: 100 mg/dL — ABNORMAL HIGH (ref 65–99)
POTASSIUM: 3.8 mmol/L (ref 3.5–5.1)
Sodium: 138 mmol/L (ref 135–145)

## 2017-05-16 LAB — VITAMIN B12: VITAMIN B 12: 737 pg/mL (ref 180–914)

## 2017-05-16 LAB — FERRITIN: FERRITIN: 42 ng/mL (ref 24–336)

## 2017-05-17 ENCOUNTER — Ambulatory Visit (INDEPENDENT_AMBULATORY_CARE_PROVIDER_SITE_OTHER): Payer: Medicare HMO | Admitting: Orthopaedic Surgery

## 2017-05-17 ENCOUNTER — Encounter: Payer: Self-pay | Admitting: Orthopaedic Surgery

## 2017-05-17 ENCOUNTER — Telehealth: Payer: Self-pay | Admitting: Orthopaedic Surgery

## 2017-05-17 VITALS — BP 132/76 | HR 85 | Temp 96.6°F | Ht 74.0 in | Wt 246.0 lb

## 2017-05-17 DIAGNOSIS — M25512 Pain in left shoulder: Secondary | ICD-10-CM

## 2017-05-17 MED ORDER — NAPROXEN 500 MG PO TABS
500.0000 mg | ORAL_TABLET | Freq: Two times a day (BID) | ORAL | 5 refills | Status: DC
Start: 2017-05-17 — End: 2017-05-17

## 2017-05-17 MED ORDER — HYDROCODONE-ACETAMINOPHEN 5-325 MG PO TABS: ORAL_TABLET | ORAL | 0 refills | Status: DC

## 2017-05-17 MED ORDER — OXYCODONE HCL 5 MG PO CAPS: ORAL_CAPSULE | ORAL | 0 refills | Status: DC

## 2017-05-17 NOTE — Telephone Encounter (Signed)
Patient called back in after going to [facility].  He says he cannot take Hydrocodone/Acetaminophen due to some medical issue.  He says the ER doctor had told him that anything with Tylenol would make him bleed.  He wants something else for pain.  His suggestion was Roxicodone.  [facility] [place]

## 2017-05-17 NOTE — Progress Notes (Signed)
cc'd to pcp 

## 2017-05-17 NOTE — Patient Instructions (Signed)
Physical therapy has been ordered for you at [facility] [phone] is the phone number to call if you want to call to schedule. Please let us know if you do not hear anything within one week.     Steps to Quit Smoking Smoking tobacco can be bad for your health. It can also affect almost every organ in your body. Smoking puts you and people around you at risk for many serious long-lasting (chronic) diseases. Quitting smoking is hard, but it is one of the best things that you can do for your health. It is never too late to quit. What are the benefits of quitting smoking? When you quit smoking, you lower your risk for getting serious diseases and conditions. They can include:  Lung cancer or lung disease.  Heart disease.  Stroke.  Heart attack.  Not being able to have children (infertility).  Weak bones (osteoporosis) and broken bones (fractures).  If you have coughing, wheezing, and shortness of breath, those symptoms may get better when you quit. You may also get sick less often. If you are pregnant, quitting smoking can help to lower your chances of having a baby of low birth weight. What can I do to help me quit smoking? Talk with your doctor about what can help you quit smoking. Some things you can do (strategies) include:  Quitting smoking totally, instead of slowly cutting back how much you smoke over a period of time.  Going to in-person counseling. You are more likely to quit if you go to many counseling sessions.  Using resources and support systems, such as: ? Online chats with a counselor. ? Phone quitlines. ? Printed self-help materials. ? Support groups or group counseling. ? Text messaging programs. ? Mobile phone apps or applications.  Taking medicines. Some of these medicines may have nicotine in them. If you are pregnant or breastfeeding, do not take any medicines to quit smoking unless your doctor says it is okay. Talk with your doctor about counseling or other  things that can help you.  Talk with your doctor about using more than one strategy at the same time, such as taking medicines while you are also going to in-person counseling. This can help make quitting easier. What things can I do to make it easier to quit? Quitting smoking might feel very hard at first, but there is a lot that you can do to make it easier. Take these steps:  Talk to your family and friends. Ask them to support and encourage you.  Call phone quitlines, reach out to support groups, or work with a counselor.  Ask people who smoke to not smoke around you.  Avoid places that make you want (trigger) to smoke, such as: ? Bars. ? Parties. ? Smoke-break areas at work.  Spend time with people who do not smoke.  Lower the stress in your life. Stress can make you want to smoke. Try these things to help your stress: ? Getting regular exercise. ? Deep-breathing exercises. ? Yoga. ? Meditating. ? Doing a body scan. To do this, close your eyes, focus on one area of your body at a time from head to toe, and notice which parts of your body are tense. Try to relax the muscles in those areas.  Download or buy apps on your mobile phone or tablet that can help you stick to your quit plan. There are many free apps, such as QuitGuide from the CDC (Centers for Disease Control and Prevention). You   can find more support from smokefree.gov and other websites.  This information is not intended to replace advice given to you by your health care provider. Make sure you discuss any questions you have with your health care provider. Document Released: 01/14/2009 Document Revised: 11/16/2015 Document Reviewed: 08/04/2014 Elsevier Interactive Patient Education  2018 [employer].  

## 2017-05-17 NOTE — Progress Notes (Signed)
Subjective:    Patient ID: Christian Sanders, male    DOB: [birth year], 57 y.o.   MRN: [id number]  HPI He fell and hurt his left shoulder on 05-04-17.  He hurt his left wrist also.  He thought it would get better.  The wrist improved, the shoulder did not. He went to the ER on 05-12-17.  X-rays of the shoulder were negative.  His shoulder still hurts.  He has limited motion.  He has pain with attempts to put on his shirt and coat.  He has no numbness.  He has taken Tylenol.  He has used ice and heat.  It still hurts.  I have reviewed the X-rays, x-ray report and ER notes.   Review of Systems  Musculoskeletal: Positive for arthralgias.  All other systems reviewed and are negative.  Past Medical History:  Diagnosis Date  . Abnormal CT of liver 12/20/2016  . Anxiety   . Bipolar disorder (HCC)   . Dentures complicating chewing    full upper, partial lower  . Depression   . Esophageal varices determined by endoscopy (HCC)   . Gastritis and duodenitis   . GERD (gastroesophageal reflux disease)   . Headache    chronic, s/p facial injury and reconstruction  . Hypertension   . Portal hypertensive gastropathy (HCC)   . PTSD (post-traumatic stress disorder)   . Vertebral column disorder    2 crushed thoracic vertebra    Past Surgical History:  Procedure Laterality Date  . BIOPSY  04/17/2017   Procedure: BIOPSY;  Surgeon: West Bali, MD;  [facility];  Service: Endoscopy;;  gastric for h pylori  . COLONOSCOPY WITH PROPOFOL N/A 10/09/2014   Procedure: COLONOSCOPY WITH PROPOFOL;  Surgeon: Midge Minium, MD;  Location: [facility];  Service: Endoscopy;  Laterality: N/A;  . ESOPHAGEAL BANDING N/A 04/17/2017   Procedure: ESOPHAGEAL BANDING;  Surgeon: West Bali, MD;  [facility];  Service: Endoscopy;  Laterality: N/A;  . ESOPHAGOGASTRODUODENOSCOPY (EGD) WITH PROPOFOL N/A 04/17/2017   Procedure: ESOPHAGOGASTRODUODENOSCOPY (EGD) WITH PROPOFOL;  Surgeon: West Bali, MD;  [facility];  Service: Endoscopy;  Laterality: N/A;  10:45am  . FACIAL RECONSTRUCTION SURGERY     eye socket with 2 metal plates    Current Outpatient Medications on File Prior to Visit  Medication Sig Dispense Refill  . ALPRAZolam (XANAX) 0.5 MG tablet Take 0.5 mg by mouth 2 (two) times daily.     Marland Kitchen amLODipine (NORVASC) 5 MG tablet Take 1 tablet (5 mg total) by mouth daily. 30 tablet 5  . fluticasone (FLONASE) 50 MCG/ACT nasal spray Place 2 sprays into both nostrils daily. Use for 4-6 weeks then stop and use seasonally or as needed. 16 g 3  . gabapentin (NEURONTIN) 100 MG capsule Start 1 capsule daily, increase by 1 cap every 2-3 days as tolerated up to 3 times a day, or may take 3 at once in evening. 90 capsule 1  . LATUDA 40 MG TABS tablet TAKE 1 TABLET BY MOUTH AT NIGHT WITH A MEAL  3  . lidocaine (XYLOCAINE) 2 % solution 2 TSP PO Q4H IF NEEDED. USE 30 MINS PRIOR TO MEALS AND PRN FOR ABDOMINAL/CHEST PAIN.  NO MORE> 8 DOSES/DAY. 300 mL 5  . lisinopril (PRINIVIL,ZESTRIL) 40 MG tablet TAKE 1 TABLET BY MOUTH ONCE EVERY MORNING 30 tablet 6  . omeprazole (PRILOSEC) 20 MG capsule 1 PO 30 MINS PRIOR TO FIRST MEAL (Patient taking differently: Take 20  mg by mouth every morning. 1 PO 30 MINS PRIOR TO FIRST MEAL) 30 capsule 11  . prochlorperazine (COMPAZINE) 5 MG tablet Take 1 tablet (5 mg total) by mouth 2 (two) times daily as needed for nausea or vomiting (or headache). 10 tablet 0  . sildenafil (REVATIO) 20 MG tablet TAKE 1 TO 5 TABLETS BY MOUTH ONCE DAILY AS NEEDED 50 tablet 5  . traMADol (ULTRAM) 50 MG tablet Take 1 tablet (50 mg total) by mouth every 6 (six) hours as needed. 10 tablet 0  . diclofenac sodium (VOLTAREN) 1 % GEL Apply to left shoulder front and back tid (Patient not taking: Reported on 05/17/2017) 100 g 1   No current facility-administered medications on file prior to visit.     Social History   Socioeconomic History  . Marital status: Single    Spouse name:  Not on file  . Number of children: Not on file  . Years of education: Not on file  . Highest education level: Not on file  Social Needs  . Financial resource strain: Not on file  . Food insecurity - worry: Not on file  . Food insecurity - inability: Not on file  . Transportation needs - medical: Not on file  . Transportation needs - non-medical: Not on file  Occupational History  . Not on file  Tobacco Use  . Smoking status: Current Every Day Smoker    Packs/day: 0.25    Years: 30.00    Pack years: 7.50    Types: Cigarettes  . Smokeless tobacco: Never Used  Substance and Sexual Activity  . Alcohol use: No    Alcohol/week: 3.6 oz    Types: 6 Cans of beer per week    Frequency: Never    Comment: None as of 03/08/17; previously 1-1.5 cases of beer a day in his 3120s.  . Drug use: No  . Sexual activity: No    Birth control/protection: None  Other Topics Concern  . Not on file  Social History Narrative  . Not on file    Family History  Problem Relation Age of Onset  . Leukemia Father   . Colon cancer Neg Hx   . Liver disease Neg Hx     BP 132/76   Pulse 85   Temp (!) 96.6 F (35.9 C)   Ht 6\' 2"  (1.88 m)   Wt 246 lb (111.6 kg)   BMI 31.58 kg/m      Objective:   Physical Exam  Constitutional: He is oriented to person, place, and time. He appears well-developed and well-nourished.  HENT:  Head: Normocephalic and atraumatic.  Eyes: Conjunctivae and EOM are normal. Pupils are equal, round, and reactive to light.  Neck: Normal range of motion. Neck supple.  Cardiovascular: Normal rate, regular rhythm and intact distal pulses.  Pulmonary/Chest: Effort normal.  Abdominal: Soft.  Musculoskeletal: He exhibits tenderness (Right shoulder negative.  Left shoulder with pain.  ROM forward 90, abduct 50, internal 20, external 10, extend 0, adduction 20, NV intact.  Neck negative.).  Neurological: He is alert and oriented to person, place, and time. He has normal reflexes. He  displays normal reflexes. No cranial nerve deficit. He exhibits normal muscle tone. Coordination normal.  Skin: Skin is warm and dry.  Psychiatric: He has a normal mood and affect. His behavior is normal. Judgment and thought content normal.       Assessment & Plan:   Encounter Diagnosis  Name Primary?  . Acute pain of left  shoulder Yes   PROCEDURE NOTE:  The patient request injection, verbal consent was obtained.  The left shoulder was prepped appropriately after time out was performed.   Sterile technique was observed and injection of 1 cc of Depo-Medrol 40 mg with several cc's of plain xylocaine. Anesthesia was provided by ethyl chloride and a 20-gauge needle was used to inject the shoulder area. A posterior approach was used.  The injection was tolerated well.  A band aid dressing was applied.  The patient was advised to apply ice later today and tomorrow to the injection sight as needed.  Begin OT for the shoulder.  I have given pain medicine.  I have reviewed the [place] Controlled Substance Reporting System web site prior to prescribing narcotic medicine for this patient.  I originally Rx Naprosyn but he has esophageal varices and I cancelled the order.  Return in one week.  Call if any problem.  Precautions discussed.  He may need MRI.   Electronically Signed Darreld Mclean, MD 2/14/20198:51 AM

## 2017-05-18 LAB — FOLATE RBC
Folate, Hemolysate: 410.2 ng/mL
Folate, RBC: UNDETERMINED ng/mL

## 2017-05-20 LAB — HEPATITIS C GENOTYPE

## 2017-05-20 LAB — HCV RNA QUANT RFLX ULTRA OR GENOTYP
HCV RNA QNT(LOG COPY/ML): 3.646 {Log_IU}/mL
HEPATITIS C QUANTITATION: 4430 [IU]/mL

## 2017-05-21 ENCOUNTER — Other Ambulatory Visit: Payer: Self-pay

## 2017-05-21 ENCOUNTER — Ambulatory Visit (HOSPITAL_COMMUNITY): Payer: Medicare HMO | Attending: Orthopaedic Surgery | Admitting: Specialist

## 2017-05-21 ENCOUNTER — Encounter (HOSPITAL_COMMUNITY): Payer: Self-pay | Admitting: Specialist

## 2017-05-21 DIAGNOSIS — M25612 Stiffness of left shoulder, not elsewhere classified: Secondary | ICD-10-CM | POA: Diagnosis not present

## 2017-05-21 DIAGNOSIS — M25512 Pain in left shoulder: Secondary | ICD-10-CM | POA: Insufficient documentation

## 2017-05-21 DIAGNOSIS — R29898 Other symptoms and signs involving the musculoskeletal system: Secondary | ICD-10-CM

## 2017-05-21 NOTE — Therapy (Signed)
[facility] [address] [place], [place], [address] Phone: [phone]   Fax:  [phone]  Occupational Therapy Evaluation  Patient Details  Name: Christian Sanders MRN: [id number] Date of Birth: 05/20/1960 Referring Provider: Dr. Darreld McleanWayne Keeling   Encounter Date: 05/21/2017  OT End of Session - 05/21/17 1353    Visit Number  1    Number of Visits  16    Date for OT Re-Evaluation  07/20/17 mini reassess on 06/18/17    Authorization Type  [employer]    OT Start Time  1300    OT Stop Time  1340    OT Time Calculation (min)  40 min    Activity Tolerance  Patient tolerated treatment well;Treatment limited secondary to medical complications (Comment)    Behavior During Therapy  [facility] for tasks assessed/performed       Past Medical History:  Diagnosis Date  . Abnormal CT of liver 12/20/2016  . Anxiety   . Bipolar disorder (HCC)   . Dentures complicating chewing    full upper, partial lower  . Depression   . Esophageal varices determined by endoscopy (HCC)   . Gastritis and duodenitis   . GERD (gastroesophageal reflux disease)   . Headache    chronic, s/p facial injury and reconstruction  . Hypertension   . Portal hypertensive gastropathy (HCC)   . PTSD (post-traumatic stress disorder)   . Vertebral column disorder    2 crushed thoracic vertebra    Past Surgical History:  Procedure Laterality Date  . BIOPSY  04/17/2017   Procedure: BIOPSY;  Surgeon: West BaliFields, Sandi L, MD;  [facility];  Service: Endoscopy;;  gastric for h pylori  . COLONOSCOPY WITH PROPOFOL N/A 10/09/2014   Procedure: COLONOSCOPY WITH PROPOFOL;  Surgeon: Midge Miniumarren Wohl, MD;  [facility];  Service: Endoscopy;  Laterality: N/A;  . ESOPHAGEAL BANDING N/A 04/17/2017   Procedure: ESOPHAGEAL BANDING;  Surgeon: West BaliFields, Sandi L, MD;  [facility];  Service: Endoscopy;  Laterality: N/A;  . ESOPHAGOGASTRODUODENOSCOPY (EGD) WITH PROPOFOL N/A  04/17/2017   Procedure: ESOPHAGOGASTRODUODENOSCOPY (EGD) WITH PROPOFOL;  Surgeon: West BaliFields, Sandi L, MD;  [facility];  Service: Endoscopy;  Laterality: N/A;  10:45am  . FACIAL RECONSTRUCTION SURGERY     eye socket with 2 metal plates    There were no vitals filed for this visit.  Subjective Assessment - 05/21/17 1350    Subjective   S:  I want to be able to use my arm without pain.    Pertinent History  Christian Sanders reports falling off of steps and landing on his left shoulder.  This accident occurred 1 week ago.  Patient felt pain would improve without medical treatment.  When it did not, he consulted with Dr. Hilda LiasKeeling.  Dr. Hilda LiasKeeling administered a cortisone injection, which has not alleviated Christian Sanders pain.  He has been referred to occupational therapy for evaluation and treatment of his left shoulder.     Special Tests  FOTO:  56%    Patient Stated Goals  I want to get my shoulder working    Currently in Pain?  Yes    Pain Score  7     Pain Location  Shoulder    Pain Orientation  Left    Pain Descriptors / Indicators  Aching    Pain Type  Acute pain    Pain Onset  In the past 7 days    Pain Frequency  Intermittent  Aggravating Factors   movement    Pain Relieving Factors  heat, ice, rest    Effect of Pain on Daily Activities  moderate        [facility] OT Assessment - 05/21/17 0001      Assessment   Medical Diagnosis  Left Shoulder Pain    Referring Provider  Dr. Darreld Mclean    Onset Date/Surgical Date  -- one week ago    Hand Dominance  Right      Precautions   Precautions  None      Restrictions   Weight Bearing Restrictions  No      Balance Screen   Has the patient fallen in the past 6 months  Yes    How many times?  1    Has the patient had a decrease in activity level because of a fear of falling?   No    Is the patient reluctant to leave their home because of a fear of falling?   No      Home  Environment   Lives With  Alone      Prior Function    Level of Independence  Independent    Vocation  On disability    Leisure  walking, working around the house      ADL   ADL comments  pain in left shoulder limiting ability to reach above waist into flexion or abduction, difficulty putting items into cabinet, unable to lift anything heavy      Written Expression   Dominant Hand  Right      Vision - History   Baseline Vision  Wears glasses all the time      Cognition   Overall Cognitive Status  Within Functional Limits for tasks assessed      Observation/Other Assessments   Focus on Therapeutic Outcomes (FOTO)   56%      Sensation   Light Touch  Appears Intact      Coordination   Gross Motor Movements are Fluid and Coordinated  Yes    Fine Motor Movements are Fluid and Coordinated  Yes      ROM / Strength   AROM / PROM / Strength  AROM;PROM;Strength      Palpation   Palpation comment  moderate fascial restrictions noted in left shoulder and scapular region      AROM   Overall AROM Comments  assessed in seated, external and internal rotation with shoulder adducted    AROM Assessment Site  Shoulder    Right/Left Shoulder  Left    Left Shoulder Flexion  40 Degrees    Left Shoulder ABduction  55 Degrees    Left Shoulder Internal Rotation  80 Degrees    Left Shoulder External Rotation  55 Degrees      PROM   Overall PROM Comments  assessed in supine    PROM Assessment Site  Shoulder    Right/Left Shoulder  Left    Left Shoulder Flexion  50 Degrees    Left Shoulder ABduction  60 Degrees    Left Shoulder Internal Rotation  90 Degrees    Left Shoulder External Rotation  50 Degrees      Strength   Overall Strength Comments  assessed in seated    Strength Assessment Site  Shoulder    Right/Left Shoulder  Left    Left Shoulder Flexion  4-/5    Left Shoulder ABduction  4-/5    Left Shoulder Internal Rotation  4+/5  Left Shoulder External Rotation  4+/5               OT Treatments/Exercises (OP) - 05/21/17 0001       Manual Therapy   Manual Therapy  Myofascial release    Manual therapy comments  manual therapy intervention completed seperately from all other interventions    Myofascial Release  Myofascial release and manual stretching to left upper arm, scapular and shoulder region to decrease pain and restrictions and imrpove pain free mobilty.             OT Education - 05/21/17 1328    Education provided  Yes    Education Details  towel stretches    Person(s) Educated  Patient    Methods  Explanation;Handout;Demonstration    Comprehension  Verbalized understanding;Returned demonstration       OT Short Term Goals - 05/21/17 1357      OT SHORT TERM GOAL #1   Title  Patient will be educated on a HEP for improved left shoulder mobility required for ADL completion.    Time  4    Period  Weeks    Status  New    Target Date  06/20/17      OT SHORT TERM GOAL #2   Title  Patient will be able to improve right shoulder P/ROM to [facility] in order to don and doff shirts with less pain.    Time  4    Period  Weeks    Status  New      OT SHORT TERM GOAL #3   Title  Patient will decrease right shoulder pain to 4/10 during functional activities.     Time  4    Period  Weeks    Status  New        OT Long Term Goals - 05/21/17 1359      OT LONG TERM GOAL #1   Title  Patient will return to prior level of independence with all B/IADLs with trace amounts of pain in his left shoulder.    Time  8    Period  Weeks    Status  New    Target Date  07/20/17      OT LONG TERM GOAL #2   Title  Patient will improve left shoulder A/ROM to [facility] in order to reach overhead into cabinets.    Time  8    Status  New      OT LONG TERM GOAL #3   Title  Patient will demonstrate 4+/5 strength in his left shoulder in order to lift bags of groceries.     Time  8    Period  Weeks    Status  New      OT LONG TERM GOAL #4   Title  Patient will decrease pain level in his left shoulder to 3/10 or better  during ADL completion.    Time  8    Period  Weeks    Status  New      OT LONG TERM GOAL #5   Title  Patient will have minimal fascial restrictions in his left shoulder for improved mobility needed for ADL completion.     Time  8    Period  Weeks    Status  New            Plan - 05/21/17 1354    Clinical Impression Statement  A:  Patient is a 57 year old male with past medical  history significant for cirrohsis, bipolar disorder, and PTSD.  He fell off of some steps and landed on his left shoulder approximately 1 week ago.  Since that time, he has not been able to reach above waist height with his left arm without significant pain.  He is not able to complete his normal daily activities due to his pain level.      Occupational Profile and client history currently impacting functional performance  age, medical history, pain tolerance    Occupational performance deficits (Please refer to evaluation for details):  ADL's;IADL's;Rest and Sleep;Leisure;Social Participation    Rehab Potential  Good    Current Impairments/barriers affecting progress:  n/a    OT Frequency  2x / week    OT Duration  8 weeks    OT Treatment/Interventions  Self-care/ADL training;Electrical Stimulation;Therapeutic exercise;Moist Heat;Neuromuscular education;Patient/family education;Energy conservation;Therapeutic activities;Passive range of motion;Manual Therapy;Contrast Bath;Ultrasound    Plan  P:  Skilled OT intervention to decrease pain and restrictions and improve pain free mobility in his left shoulder in order to regain prior level of independence with his ADLs and leisure activities.  Next session: review plan of care, P/ROM and AA/ROM progressing A/ROM as able.      Clinical Decision Making  Limited treatment options, no task modification necessary    OT Home Exercise Plan  table stretches     Consulted and Agree with Plan of Care  Patient       Patient will benefit from skilled therapeutic intervention  in order to improve the following deficits and impairments:  Pain, Increased fascial restrictions, Increased muscle spasms, Decreased strength, Decreased range of motion, Impaired UE functional use  Visit Diagnosis: Acute pain of left shoulder - Plan: Ot plan of care cert/re-cert  Stiffness of left shoulder, not elsewhere classified - Plan: Ot plan of care cert/re-cert  Other symptoms and signs involving the musculoskeletal system - Plan: Ot plan of care cert/re-cert    Problem List Patient Active Problem List   Diagnosis Date Noted  . Depression 05/08/2017  . Varices of esophagus determined by endoscopy (HCC)   . Gastritis and duodenitis   . Splenomegaly 03/08/2017  . Hepatic cirrhosis (HCC) 12/27/2016  . RUQ pain 12/27/2016  . Portal hypertension (HCC) 12/27/2016  . Elevated liver enzymes 03/02/2016  . BPH without urinary obstruction 02/28/2016  . ED (erectile dysfunction) 12/28/2015  . SK (seborrheic keratosis) 12/28/2015  . Chronic headaches 02/09/2015  . Tobacco abuse 02/09/2015  . Hypertension 11/23/2014  . Bipolar disorder (HCC) 11/23/2014  . PTSD (post-traumatic stress disorder) 11/23/2014  . Special screening for malignant neoplasms, colon   . Chronic mid back pain 12/16/2013  . Closed compression fracture of thoracic vertebra  Hospital) 09/05/2013    Shirlean Mylar, MHA, OTR/L [phone]  05/21/2017, 2:08 PM  [facility] [address] [place], [place], [address] Phone: [phone]   Fax:  [phone]  Name: Christian Sanders MRN: [id number] Date of Birth: 1960-09-22

## 2017-05-21 NOTE — Patient Instructions (Signed)
COMPLETE EACH EXERCISE FOR 1-3 MINUTES EACH, 2-3 TIMES PER DAY  SHOULDER: Flexion On Table   Place hands on table, elbows straight. Slide arms across table, leaning forward towards the table as you are able. Abduction (Passive)   With arm out to side, palm down on table, slide arm across table, leaning towards the table as you are able.  Copyright  VHI. All rights reserved.     Internal Rotation (Assistive)   Seated with elbow bent at right angle and held against side, slide arm on table surface in an inward arc. Activity: Use this motion to brush crumbs off the table.  Copyright  VHI. All rights reserved.

## 2017-05-22 ENCOUNTER — Ambulatory Visit (HOSPITAL_COMMUNITY): Payer: Medicare HMO | Admitting: Anesthesiology

## 2017-05-22 ENCOUNTER — Ambulatory Visit (HOSPITAL_COMMUNITY): Payer: Medicare HMO

## 2017-05-22 ENCOUNTER — Ambulatory Visit (HOSPITAL_COMMUNITY)
Admission: RE | Admit: 2017-05-22 | Discharge: 2017-05-22 | Disposition: A | Discharge status: Home / Self Care | Payer: Medicare HMO | Source: Ambulatory Visit | Attending: Gastroenterology | Admitting: Gastroenterology

## 2017-05-22 ENCOUNTER — Other Ambulatory Visit: Payer: Self-pay

## 2017-05-22 ENCOUNTER — Telehealth: Payer: Self-pay | Admitting: Nurse Practitioner

## 2017-05-22 ENCOUNTER — Encounter (HOSPITAL_COMMUNITY)
Admission: RE | Disposition: A | Discharge status: Home / Self Care | Payer: Self-pay | Source: Ambulatory Visit | Attending: Gastroenterology

## 2017-05-22 ENCOUNTER — Encounter (HOSPITAL_COMMUNITY): Payer: Self-pay | Admitting: *Deleted

## 2017-05-22 DIAGNOSIS — I8500 Esophageal varices without bleeding: Secondary | ICD-10-CM | POA: Insufficient documentation

## 2017-05-22 DIAGNOSIS — K766 Portal hypertension: Secondary | ICD-10-CM | POA: Diagnosis not present

## 2017-05-22 DIAGNOSIS — F4310 Post-traumatic stress disorder, unspecified: Secondary | ICD-10-CM | POA: Diagnosis not present

## 2017-05-22 DIAGNOSIS — F17210 Nicotine dependence, cigarettes, uncomplicated: Secondary | ICD-10-CM | POA: Insufficient documentation

## 2017-05-22 DIAGNOSIS — I8510 Secondary esophageal varices without bleeding: Secondary | ICD-10-CM

## 2017-05-22 DIAGNOSIS — B1920 Unspecified viral hepatitis C without hepatic coma: Secondary | ICD-10-CM

## 2017-05-22 DIAGNOSIS — F319 Bipolar disorder, unspecified: Secondary | ICD-10-CM | POA: Insufficient documentation

## 2017-05-22 DIAGNOSIS — R079 Chest pain, unspecified: Secondary | ICD-10-CM | POA: Diagnosis not present

## 2017-05-22 DIAGNOSIS — K7030 Alcoholic cirrhosis of liver without ascites: Secondary | ICD-10-CM

## 2017-05-22 DIAGNOSIS — Z79899 Other long term (current) drug therapy: Secondary | ICD-10-CM | POA: Diagnosis not present

## 2017-05-22 DIAGNOSIS — K3189 Other diseases of stomach and duodenum: Secondary | ICD-10-CM

## 2017-05-22 DIAGNOSIS — Z91030 Bee allergy status: Secondary | ICD-10-CM | POA: Diagnosis not present

## 2017-05-22 DIAGNOSIS — I10 Essential (primary) hypertension: Secondary | ICD-10-CM | POA: Insufficient documentation

## 2017-05-22 DIAGNOSIS — K219 Gastro-esophageal reflux disease without esophagitis: Secondary | ICD-10-CM | POA: Insufficient documentation

## 2017-05-22 DIAGNOSIS — R69 Illness, unspecified: Secondary | ICD-10-CM | POA: Diagnosis not present

## 2017-05-22 DIAGNOSIS — Z09 Encounter for follow-up examination after completed treatment for conditions other than malignant neoplasm: Secondary | ICD-10-CM | POA: Insufficient documentation

## 2017-05-22 DIAGNOSIS — Z91018 Allergy to other foods: Secondary | ICD-10-CM | POA: Insufficient documentation

## 2017-05-22 HISTORY — PX: ESOPHAGEAL BANDING: SHX5518

## 2017-05-22 HISTORY — PX: ESOPHAGOGASTRODUODENOSCOPY (EGD) WITH PROPOFOL: SHX5813

## 2017-05-22 SURGERY — ESOPHAGOGASTRODUODENOSCOPY (EGD) WITH PROPOFOL
Anesthesia: Monitor Anesthesia Care

## 2017-05-22 MED ORDER — LIDOCAINE VISCOUS 2 % MT SOLN
15.0000 mL | Freq: Once | OROMUCOSAL | Status: AC
  Administered 2017-05-22: 15 mL via OROMUCOSAL

## 2017-05-22 MED ORDER — FENTANYL CITRATE (PF) 100 MCG/2ML IJ SOLN
50.0000 ug | Freq: Once | INTRAMUSCULAR | Status: AC
  Administered 2017-05-22: 50 ug via INTRAVENOUS

## 2017-05-22 MED ORDER — OXYCODONE HCL 5 MG PO TABS: ORAL_TABLET | ORAL | 0 refills | Status: DC

## 2017-05-22 MED ORDER — LIDOCAINE HCL 1 % IJ SOLN
INTRAMUSCULAR | Status: DC | PRN
  Administered 2017-05-22: 30 mg via INTRADERMAL

## 2017-05-22 MED ORDER — LIDOCAINE VISCOUS 2 % MT SOLN
5.0000 mL | Freq: Once | OROMUCOSAL | Status: AC
  Administered 2017-05-22: 5 mL via OROMUCOSAL

## 2017-05-22 MED ORDER — MIDAZOLAM HCL 2 MG/2ML IJ SOLN
1.0000 mg | INTRAMUSCULAR | Status: AC
  Administered 2017-05-22: 2 mg via INTRAVENOUS

## 2017-05-22 MED ORDER — FENTANYL CITRATE (PF) 100 MCG/2ML IJ SOLN
INTRAMUSCULAR | Status: AC
  Filled 2017-05-22: qty 2

## 2017-05-22 MED ORDER — LACTATED RINGERS IV SOLN
INTRAVENOUS | Status: DC
  Administered 2017-05-22: 08:00:00 via INTRAVENOUS

## 2017-05-22 MED ORDER — PROPOFOL 500 MG/50ML IV EMUL
INTRAVENOUS | Status: DC | PRN
  Administered 2017-05-22: 125 ug/kg/min via INTRAVENOUS

## 2017-05-22 MED ORDER — LIDOCAINE VISCOUS 2 % MT SOLN
OROMUCOSAL | Status: AC
  Filled 2017-05-22: qty 15

## 2017-05-22 MED ORDER — CIPROFLOXACIN IN D5W 400 MG/200ML IV SOLN
INTRAVENOUS | Status: AC
  Filled 2017-05-22: qty 200

## 2017-05-22 MED ORDER — CIPROFLOXACIN HCL 500 MG PO TABS: ORAL_TABLET | ORAL | 0 refills | Status: DC

## 2017-05-22 MED ORDER — CIPROFLOXACIN IN D5W 400 MG/200ML IV SOLN
INTRAVENOUS | Status: DC | PRN
  Administered 2017-05-22: 400 mg via INTRAVENOUS

## 2017-05-22 MED ORDER — MIDAZOLAM HCL 2 MG/2ML IJ SOLN
INTRAMUSCULAR | Status: AC
  Filled 2017-05-22: qty 2

## 2017-05-22 MED ORDER — FENTANYL CITRATE (PF) 100 MCG/2ML IJ SOLN
25.0000 ug | Freq: Once | INTRAMUSCULAR | Status: AC
  Administered 2017-05-22: 25 ug via INTRAVENOUS

## 2017-05-22 MED ORDER — LIDOCAINE VISCOUS 2 % MT SOLN
OROMUCOSAL | 1 refills | Status: DC
Start: 2017-05-22 — End: 2017-06-27

## 2017-05-22 MED ORDER — PROPOFOL 10 MG/ML IV BOLUS
INTRAVENOUS | Status: AC
  Filled 2017-05-22: qty 120

## 2017-05-22 NOTE — Progress Notes (Signed)
See separate phone note. Pt is being referred [place] Hep [place] clinic [place]

## 2017-05-22 NOTE — Anesthesia Postprocedure Evaluation (Signed)
Anesthesia Post Note  Patient: Christian Sanders  Procedure(s) Performed: ESOPHAGOGASTRODUODENOSCOPY (EGD) WITH PROPOFOL (N/A ) ESOPHAGEAL BANDING (N/A )  Patient location during evaluation: Short Stay Anesthesia Type: MAC Level of consciousness: awake and alert, oriented and patient cooperative Pain management: pain level controlled Vital Signs Assessment: post-procedure vital signs reviewed and stable Respiratory status: spontaneous breathing and respiratory function stable Cardiovascular status: blood pressure returned to baseline and stable Postop Assessment: adequate PO intake Anesthetic complications: no     Last Vitals:  Vitals:   05/22/17 0820 05/22/17 0854  BP: 135/82 136/83  Pulse:  90  Resp: 14 15  Temp:  (P) 36.6 C  SpO2: 96% 98%    Last Pain:  Vitals:   05/22/17 0720  TempSrc: Oral                 Nayellie Sanseverino

## 2017-05-22 NOTE — Telephone Encounter (Signed)
Correction to the result note for his hepatitis C labs.  Please refer the patient to the liver clinic in [place] to discuss hepatitis C treatment given his decompensated liver disease.

## 2017-05-22 NOTE — Transfer of Care (Signed)
Immediate Anesthesia Transfer of Care Note  Patient: Christian Sanders  Procedure(s) Performed: ESOPHAGOGASTRODUODENOSCOPY (EGD) WITH PROPOFOL (N/A ) ESOPHAGEAL BANDING (N/A )  Patient Location: PACU  Anesthesia Type:MAC  Level of Consciousness: awake, alert , oriented and patient cooperative  Airway & Oxygen Therapy: Patient Spontanous Breathing and Patient connected to nasal cannula oxygen  Post-op Assessment: Report given to RN and Post -op Vital signs reviewed and stable  Post vital signs: Reviewed and stable  Last Vitals:  Vitals:   05/22/17 0815 05/22/17 0820  BP: 124/82 135/82  Resp: 15 14  Temp:    SpO2: 95% 96%    Last Pain:  Vitals:   05/22/17 0720  TempSrc: Oral         Complications: No apparent anesthesia complications

## 2017-05-22 NOTE — Anesthesia Preprocedure Evaluation (Signed)
Anesthesia Evaluation  Patient identified by MRN, date of birth, ID band Patient awake    Reviewed: Allergy & Precautions, NPO status , Patient's Chart, lab work & pertinent test results  Airway Mallampati: II  TM Distance: >3 FB Neck ROM: Full    Dental  (+) Edentulous Upper   Pulmonary Current Smoker,    breath sounds clear to auscultation       Cardiovascular hypertension, Pt. on medications  Rhythm:Regular Rate:Normal     Neuro/Psych  Headaches, PSYCHIATRIC DISORDERS (PTSD) Anxiety Depression Bipolar Disorder    GI/Hepatic GERD  ,(+) Cirrhosis  ( Portal hypertension )  Esophageal Varices    ,   Endo/Other    Renal/GU      Musculoskeletal 2 crushed thoracic vertebra   Abdominal   Peds  Hematology   Anesthesia Other Findings   Reproductive/Obstetrics                             Anesthesia Physical Anesthesia Plan  ASA: III  Anesthesia Plan: MAC   Post-op Pain Management:    Induction: Intravenous  PONV Risk Score and Plan:   Airway Management Planned: Simple Face Mask  Additional Equipment:   Intra-op Plan:   Post-operative Plan:   Informed Consent: I have reviewed the patients History and Physical, chart, labs and discussed the procedure including the risks, benefits and alternatives for the proposed anesthesia with the patient or authorized representative who has indicated his/her understanding and acceptance.     Plan Discussed with:   Anesthesia Plan Comments:         Anesthesia Quick Evaluation  

## 2017-05-22 NOTE — H&P (Signed)
Primary Care Physician:  Smitty Cords, DO Primary Gastroenterologist:  Dr. Darrick Penna  Pre-Procedure History & Physical: HPI:  Christian Sanders is a 57 y.o. male here for SCREENING for VARICES.  Past Medical History:  Diagnosis Date  . Abnormal CT of liver 12/20/2016  . Anxiety   . Bipolar disorder (HCC)   . Dentures complicating chewing    full upper, partial lower  . Depression   . Esophageal varices determined by endoscopy (HCC)   . Gastritis and duodenitis   . GERD (gastroesophageal reflux disease)   . Headache    chronic, s/p facial injury and reconstruction  . Hypertension   . Portal hypertensive gastropathy (HCC)   . PTSD (post-traumatic stress disorder)   . Vertebral column disorder    2 crushed thoracic vertebra    Past Surgical History:  Procedure Laterality Date  . BIOPSY  04/17/2017   Procedure: BIOPSY;  Surgeon: West Bali, MD;  [facility];  Service: Endoscopy;;  gastric for h pylori  . COLONOSCOPY WITH PROPOFOL N/A 10/09/2014   Procedure: COLONOSCOPY WITH PROPOFOL;  Surgeon: Midge Minium, MD;  Location: [facility];  Service: Endoscopy;  Laterality: N/A;  . ESOPHAGEAL BANDING N/A 04/17/2017   Procedure: ESOPHAGEAL BANDING;  Surgeon: West Bali, MD;  [facility];  Service: Endoscopy;  Laterality: N/A;  . ESOPHAGOGASTRODUODENOSCOPY (EGD) WITH PROPOFOL N/A 04/17/2017   Procedure: ESOPHAGOGASTRODUODENOSCOPY (EGD) WITH PROPOFOL;  Surgeon: West Bali, MD;  [facility];  Service: Endoscopy;  Laterality: N/A;  10:45am  . FACIAL RECONSTRUCTION SURGERY     eye socket with 2 metal plates    Prior to Admission medications   Medication Sig Start Date End Date Taking? Authorizing Provider  ALPRAZolam Prudy Feeler) 0.5 MG tablet Take 0.5 mg by mouth 2 (two) times daily.  12/15/15  Yes [provider]  amLODipine (NORVASC) 5 MG tablet Take 1 tablet (5 mg total) by mouth daily. 05/08/17  Yes Karamalegos, Netta Neat,  DO  diclofenac sodium (VOLTAREN) 1 % GEL Apply to left shoulder front and back tid 05/11/17  Yes Ivery Quale, PA-C  gabapentin (NEURONTIN) 100 MG capsule Start 1 capsule daily, increase by 1 cap every 2-3 days as tolerated up to 3 times a day, or may take 3 at once in evening. 05/08/17  Yes Karamalegos, Netta Neat, DO  HYDROcodone-acetaminophen (NORCO/VICODIN) 5-325 MG tablet One tablet by mouth every six hours as needed for pain.  Seven day limit 05/17/17  Yes Darreld Mclean, MD  lidocaine (XYLOCAINE) 2 % solution 2 TSP PO Q4H IF NEEDED. USE 30 MINS PRIOR TO MEALS AND PRN FOR ABDOMINAL/CHEST PAIN.  NO MORE> 8 DOSES/DAY. 04/17/17  Yes Telly Jawad L, MD  omeprazole (PRILOSEC) 20 MG capsule 1 PO 30 MINS PRIOR TO FIRST MEAL Patient taking differently: Take 20 mg by mouth every morning. 1 PO 30 MINS PRIOR TO FIRST MEAL 04/17/17  Yes Alaylah Heatherington L, MD  sildenafil (REVATIO) 20 MG tablet TAKE 1 TO 5 TABLETS BY MOUTH ONCE DAILY AS NEEDED 05/08/17  Yes Karamalegos, Alexander J, DO  fluticasone (FLONASE) 50 MCG/ACT nasal spray Place 2 sprays into both nostrils daily. Use for 4-6 weeks then stop and use seasonally or as needed. 05/08/17   Karamalegos, Alexander J, DO  LATUDA 40 MG TABS tablet TAKE 1 TABLET BY MOUTH AT NIGHT WITH A MEAL 04/05/17   [provider]  lisinopril (PRINIVIL,ZESTRIL) 40 MG tablet TAKE 1 TABLET BY MOUTH ONCE EVERY MORNING 06/12/16  Janeann ForehandHawkins, James H Jr., MD  oxycodone (OXY-IR) 5 MG capsule One every four hours as needed for pain.  5 day limit as acute pain. Patient not taking: Reported on 05/21/2017 05/17/17   Darreld McleanKeeling, Wayne, MD  prochlorperazine (COMPAZINE) 5 MG tablet Take 1 tablet (5 mg total) by mouth 2 (two) times daily as needed for nausea or vomiting (or headache). 05/02/17   Samuel JesterMcManus, Kathleen, DO  traMADol (ULTRAM) 50 MG tablet Take 1 tablet (50 mg total) by mouth every 6 (six) hours as needed. 05/11/17   Ivery QualeBryant, Hobson, PA-C    Allergies as of 04/19/2017 - Review Complete  04/17/2017  Allergen Reaction Noted  . Bee venom Swelling and Other (See Comments) 12/28/2015  . Pineapple Nausea And Vomiting 10/06/2014  . Pollen extract Other (See Comments) 06/03/2015    Family History  Problem Relation Age of Onset  . Leukemia Father   . Colon cancer Neg Hx   . Liver disease Neg Hx     Social History   Socioeconomic History  . Marital status: Single    Spouse name: Not on file  . Number of children: Not on file  . Years of education: Not on file  . Highest education level: Not on file  Social Needs  . Financial resource strain: Not on file  . Food insecurity - worry: Not on file  . Food insecurity - inability: Not on file  . Transportation needs - medical: Not on file  . Transportation needs - non-medical: Not on file  Occupational History  . Not on file  Tobacco Use  . Smoking status: Current Every Day Smoker    Packs/day: 0.25    Years: 30.00    Pack years: 7.50    Types: Cigarettes  . Smokeless tobacco: Never Used  Substance and Sexual Activity  . Alcohol use: No    Alcohol/week: 3.6 oz    Types: 6 Cans of beer per week    Frequency: Never    Comment: None as of 03/08/17; previously 1-1.5 cases of beer a day in his 3620s.  . Drug use: No  . Sexual activity: No    Birth control/protection: None  Other Topics Concern  . Not on file  Social History Narrative  . Not on file    Review of Systems: See HPI, otherwise negative ROS   Physical Exam: Temp 98.1 F (36.7 C) (Oral)   Ht 6\' 2"  (1.88 m)   Wt 246 lb (111.6 kg)   BMI 31.58 kg/m  General:   Alert,  pleasant and cooperative in NAD Head:  Normocephalic and atraumatic. Neck:  Supple; Lungs:  Clear throughout to auscultation.    Heart:  Regular rate and rhythm. Abdomen:  Soft, nontender and nondistended. Normal bowel sounds, without guarding, and without rebound.   Neurologic:  Alert and  oriented x4;  grossly normal neurologically.  Impression/Plan:     SCREENING for  VARICES  PLAN:  1.EGD TODAY DISCUSSED PROCEDURE, BENEFITS, & RISKS: < 1% chance of medication reaction, bleeding, OR perforation.

## 2017-05-22 NOTE — Telephone Encounter (Signed)
[facility].

## 2017-05-22 NOTE — Op Note (Signed)
[facility] Patient Name: Christian CruelKarl Heishman Procedure Date: 05/22/2017 8:12 AM MRN: [id number] Date of Birth: 04/11/1960 Attending MD: Jonette EvaSandi Fields MD, MD CSN: [id number] Age: 57 Admit Type: Outpatient Procedure:                Upper GI endoscopy WITH VARICEAL BANDING Indications:              1st degree variceal eradication (no prior bleeding) Providers:                Jonette EvaSandi Fields MD, MD, Judee ClaraPamela Shreve, RN, Burke Keelsrisann                            Tilley, Technician Referring MD:             Smitty CordsAlexander J. Karamalegos Medicines:                Propofol per Anesthesia, Cipro 400 mg IV Complications:            No immediate complications. Estimated Blood Loss:     Estimated blood loss was minimal. Procedure:                Pre-Anesthesia Assessment:                           - Prior to the procedure, a History and Physical                            was performed, and patient medications and                            allergies were reviewed. The patient's tolerance of                            previous anesthesia was also reviewed. The risks                            and benefits of the procedure and the sedation                            options and risks were discussed with the patient.                            All questions were answered, and informed consent                            was obtained. Prior Anticoagulants: The patient has                            taken previous NSAID medication, last dose was more                            than 4 weeks prior to procedure. ASA Grade                            Assessment: II - A patient with mild systemic  disease. After reviewing the risks and benefits,                            the patient was deemed in satisfactory condition to                            undergo the procedure. After obtaining informed                            consent, the endoscope was passed under direct   vision. Throughout the procedure, the patient's                            blood pressure, pulse, and oxygen saturations were                            monitored continuously. The EG-[id number]) scope                            was introduced through the mouth, and advanced to                            the second part of duodenum. The upper GI endoscopy                            was accomplished without difficulty. The patient                            tolerated the procedure well. Scope In: 8:35:25 AM Scope Out: 8:46:19 AM Total Procedure Duration: 0 hours 10 minutes 54 seconds  Findings:      Five columns of non-bleeding grade II, grade III varices were found in       the distal esophagus,. No stigmata of recent bleeding were evident and       no red wale signs were present. Stigmata of prior treatment were       evident. Three bands were successfully placed with complete eradication,       resulting in deflation of varices. There was no bleeding at the end of       the procedure.      Moderate portal hypertensive gastropathy was found in the entire       examined stomach.      The ampulla and examined duodenum were normal. Impression:               - Non-bleeding grade II and grade III esophageal                            varices. Completely eradicated. Banded x3.                           - MODERATE Portal hypertensive gastropathy. Moderate Sedation:      Per Anesthesia Care Recommendation:           - Resume previous diet.                           -  Continue present medications.                           - Repeat upper endoscopy in 1 month for retreatment.                           - Return to GI office in 4 months.                           - Cipro (ciprofloxacin) 500 mg PO BID for 7 days.                           - Patient has a contact number available for                            emergencies. The signs and symptoms of potential                            delayed  complications were discussed with the                            patient. Return to normal activities tomorrow.                            Written discharge instructions were provided to the                            patient. Procedure Code(s):        --- Professional ---                           [id number], Esophagogastroduodenoscopy, flexible,                            transoral; with band ligation of esophageal/gastric                            varices Diagnosis Code(s):        --- Professional ---                           I85.00, Esophageal varices without bleeding                           K76.6, Portal hypertension                           K31.89, Other diseases of stomach and duodenum CPT copyright 2016 American Medical Association. All rights reserved. The codes documented in this report are preliminary and upon coder review may  be revised to meet current compliance requirements. Jonette Eva, MD Jonette Eva MD, MD 05/22/2017 8:59:53 AM This report has been signed electronically. Number of Addenda: 0

## 2017-05-22 NOTE — Discharge Instructions (Signed)
You have esophageal varices(BULGING VEINS IN YOUR ESOPHAGUS). I PLACED THREE BANDS. YOUR ULCERS ARE IMPROVED. YOU HAVE PORTAL HYPERTENSIVE GASTROPATHY, WHICH IS DUE TO CIRRHOSIS. IT MEANS YOU MAY HAVE TROUBLE WITH A LOW BLOOD COUNT DUE TO CHRONIC BLOOD LOSS FROM THE LINIG OF YOUR STOMACH.   USE VISCOUS LIDOCAINE 2 TSP every 4 hours to relieve CHEST, OR UPPER ABDOMINAL PAIN OR HEARTBURN. USE NO MORE THAN 8 DOSES A DAY. IT WILL MAKE your  MOUTH, ESOPHAGUS, AND STOMACH NUMB.  Use oxycodone if needed for additional pain relief.  TAKE ALL OF YOUR CIPRO AS PRESCRIBED.  [facility] Office to call you.  FOLLOW UP IN JUN 2019. Follow up with Christian Sanders on June 6 ,2019 at 10:30  UPPER ENDOSCOPY AFTER CARE Read the instructions outlined below and refer to this sheet in the next week. These discharge instructions provide you with general information on caring for yourself after you leave the hospital. While your treatment has been planned according to the most current medical practices available, unavoidable complications occasionally occur. If you have any problems or questions after discharge, call Christian Sanders, [phone].  ACTIVITY  You may resume your regular activity, but move at a slower pace for the next 24 hours.   Take frequent rest periods for the next 24 hours.   Walking will help get rid of the air and reduce the bloated feeling in your belly (abdomen).   No driving for 24 hours (because of the medicine (anesthesia) used during the test).   You may shower.   Do not sign any important legal documents or operate any machinery for 24 hours (because of the anesthesia used during the test).    NUTRITION  Drink plenty of fluids.   You may resume your normal diet as instructed by your doctor.   Begin with a light meal and progress to your normal diet. Heavy or fried foods are harder to digest and may make you feel sick to your stomach (nauseated).    Avoid alcoholic beverages for 24 hours or as instructed.    MEDICATIONS  You may resume your normal medications.   WHAT YOU CAN EXPECT TODAY  Some feelings of bloating in the abdomen.   Passage of more gas than usual.    IF YOU HAD A BIOPSY TAKEN DURING THE UPPER ENDOSCOPY:  Eat a soft diet IF YOU HAVE NAUSEA, BLOATING, ABDOMINAL PAIN, OR VOMITING.    FINDING OUT THE RESULTS OF YOUR TEST Not all test results are available during your visit. Christian Sanders WILL CALL YOU WITHIN 7 DAYS OF YOUR PROCEDUE WITH YOUR RESULTS. Do not assume everything is normal if you have not heard from Christian Sanders IN ONE WEEK, CALL HER OFFICE AT [phone].  SEEK IMMEDIATE MEDICAL ATTENTION AND CALL THE OFFICE: [phone] IF:  You have more than a spotting of blood in your stool.   Your belly is swollen (abdominal distention).   You are nauseated or vomiting.   You have a temperature over 101F.   You have abdominal pain or discomfort that is severe or gets worse throughout the day.

## 2017-05-22 NOTE — Telephone Encounter (Signed)
Pt is aware and Ok to make referral.

## 2017-05-23 ENCOUNTER — Telehealth: Payer: Self-pay | Admitting: Gastroenterology

## 2017-05-23 ENCOUNTER — Telehealth: Payer: Self-pay

## 2017-05-23 ENCOUNTER — Other Ambulatory Visit: Payer: Self-pay

## 2017-05-23 ENCOUNTER — Telehealth (HOSPITAL_COMMUNITY): Payer: Self-pay | Admitting: Family Medicine

## 2017-05-23 ENCOUNTER — Ambulatory Visit (HOSPITAL_COMMUNITY): Payer: Medicare HMO

## 2017-05-23 DIAGNOSIS — I8500 Esophageal varices without bleeding: Secondary | ICD-10-CM

## 2017-05-23 DIAGNOSIS — K7460 Unspecified cirrhosis of liver: Secondary | ICD-10-CM

## 2017-05-23 NOTE — Telephone Encounter (Signed)
05/23/17  lady caller said he had an endoscopy done yesterday and wasn't feeling to good this morning

## 2017-05-23 NOTE — Telephone Encounter (Signed)
Endo scheduler advised pt would receive pre-op phone call 06/20/17 (noted on instructions). Instructions mailed to pt.

## 2017-05-23 NOTE — Telephone Encounter (Signed)
[facility]enn[facility] at [facility]. [facility] wants pt to repeat procedure in 1 month to assess varices. [facility] advised for pt to have EGD/varices banding. Called pt, EGD/varices banding with Propofol scheduled for 06/26/17 at 1:45pm. Orders entered. LMOVM for Endo scheduler to find out about pre-op for next procedure.

## 2017-05-23 NOTE — Telephone Encounter (Signed)
Patient already scheduled for procedure by MB on 06/26/17. See other phone note dated for today's date. Thanks

## 2017-05-23 NOTE — Telephone Encounter (Signed)
REVIEWED-NO ADDITIONAL RECOMMENDATIONS. 

## 2017-05-23 NOTE — Telephone Encounter (Signed)
Per SF procedure note from yesterday she wants patient to follow up in 4 months and to have a repeat EGD in one month. Does patient need OV prior to scheduling or can he be triaged?

## 2017-05-24 ENCOUNTER — Encounter (HOSPITAL_COMMUNITY): Payer: Self-pay | Admitting: Gastroenterology

## 2017-05-24 ENCOUNTER — Ambulatory Visit (INDEPENDENT_AMBULATORY_CARE_PROVIDER_SITE_OTHER): Payer: Medicare HMO | Admitting: Orthopaedic Surgery

## 2017-05-24 VITALS — BP 125/78 | HR 83 | Ht 74.0 in | Wt 233.0 lb

## 2017-05-24 DIAGNOSIS — M25512 Pain in left shoulder: Secondary | ICD-10-CM

## 2017-05-24 MED ORDER — OXYCODONE HCL 5 MG PO TABS: 5.0000 mg | ORAL_TABLET | Freq: Four times a day (QID) | ORAL | 0 refills | Status: DC | PRN

## 2017-05-24 NOTE — Progress Notes (Signed)
Patient ZO:XWRU Sanders, male DOB:[birth year], 57 y.o. EAV:[id number]  Chief Complaint  Patient presents with  . Shoulder Pain    HPI  Christian Sanders is a 57 y.o. male who has left shoulder pain. He has been to OT and is improving.  He has pain still and pain with overhead use but he can raise his arm up now.  He has no new trauma.    He had esophagus banding a few days ago. HPI  Body mass index is 29.92 kg/m.  ROS  Review of Systems  Musculoskeletal: Positive for arthralgias.  All other systems reviewed and are negative.   Past Medical History:  Diagnosis Date  . Abnormal CT of liver 12/20/2016  . Anxiety   . Bipolar disorder (HCC)   . Dentures complicating chewing    full upper, partial lower  . Depression   . Esophageal varices determined by endoscopy (HCC)   . Gastritis and duodenitis   . GERD (gastroesophageal reflux disease)   . Headache    chronic, s/p facial injury and reconstruction  . Hypertension   . Portal hypertensive gastropathy (HCC)   . PTSD (post-traumatic stress disorder)   . Vertebral column disorder    2 crushed thoracic vertebra    Past Surgical History:  Procedure Laterality Date  . BIOPSY  04/17/2017   Procedure: BIOPSY;  Surgeon: West Bali, MD;  [facility];  Service: Endoscopy;;  gastric for h pylori  . COLONOSCOPY WITH PROPOFOL N/A 10/09/2014   Procedure: COLONOSCOPY WITH PROPOFOL;  Surgeon: Midge Minium, MD;  Location: [facility];  Service: Endoscopy;  Laterality: N/A;  . ESOPHAGEAL BANDING N/A 04/17/2017   Procedure: ESOPHAGEAL BANDING;  Surgeon: West Bali, MD;  [facility];  Service: Endoscopy;  Laterality: N/A;  . ESOPHAGEAL BANDING N/A 05/22/2017   Procedure: ESOPHAGEAL BANDING;  Surgeon: West Bali, MD;  [facility];  Service: Endoscopy;  Laterality: N/A;  . ESOPHAGOGASTRODUODENOSCOPY (EGD) WITH PROPOFOL N/A 04/17/2017   Procedure: ESOPHAGOGASTRODUODENOSCOPY (EGD) WITH PROPOFOL;   Surgeon: West Bali, MD;  [facility];  Service: Endoscopy;  Laterality: N/A;  10:45am  . ESOPHAGOGASTRODUODENOSCOPY (EGD) WITH PROPOFOL N/A 05/22/2017   Procedure: ESOPHAGOGASTRODUODENOSCOPY (EGD) WITH PROPOFOL;  Surgeon: West Bali, MD;  [facility];  Service: Endoscopy;  Laterality: N/A;  . FACIAL RECONSTRUCTION SURGERY     eye socket with 2 metal plates    Family History  Problem Relation Age of Onset  . Leukemia Father   . Colon cancer Neg Hx   . Liver disease Neg Hx     Social History Social History   Tobacco Use  . Smoking status: Current Every Day Smoker    Packs/day: 0.25    Years: 30.00    Pack years: 7.50    Types: Cigarettes  . Smokeless tobacco: Never Used  Substance Use Topics  . Alcohol use: No    Alcohol/week: 3.6 oz    Types: 6 Cans of beer per week    Frequency: Never    Comment: None as of 03/08/17; previously 1-1.5 cases of beer a day in his 81s.  . Drug use: No    Allergies  Allergen Reactions  . Bee Venom Swelling and Other (See Comments)    Throat and Tongue   . Ibuprofen     Due to liver  . Pineapple Nausea And Vomiting  . Pollen Extract Other (See Comments)    Runny nose, itchy, watery eyes, congestion  .  Tylenol [Acetaminophen]     Due to liver    Current Outpatient Medications  Medication Sig Dispense Refill  . ALPRAZolam (XANAX) 0.5 MG tablet Take 0.5 mg by mouth 2 (two) times daily.     Marland Kitchen. amLODipine (NORVASC) 5 MG tablet Take 1 tablet (5 mg total) by mouth daily. 30 tablet 5  . ciprofloxacin (CIPRO) 500 MG tablet 1 po bid for 7 days 14 tablet 0  . diclofenac sodium (VOLTAREN) 1 % GEL Apply to left shoulder front and back tid 100 g 1  . fluticasone (FLONASE) 50 MCG/ACT nasal spray Place 2 sprays into both nostrils daily. Use for 4-6 weeks then stop and use seasonally or as needed. 16 g 3  . gabapentin (NEURONTIN) 100 MG capsule Start 1 capsule daily, increase by 1 cap every 2-3 days as tolerated up to 3  times a day, or may take 3 at once in evening. 90 capsule 1  . HYDROcodone-acetaminophen (NORCO/VICODIN) 5-325 MG tablet One tablet by mouth every six hours as needed for pain.  Seven day limit 28 tablet 0  . lidocaine (XYLOCAINE) 2 % solution 2 TSP PO Q4H IF NEEDED. USE 30 MINS PRIOR TO MEALS AND PRN FOR ABDOMINAL/CHEST PAIN.  NO MORE> 8 DOSES/DAY. 300 mL 5  . lidocaine (XYLOCAINE) 2 % solution 2 TSP  PO 30 MINUTES PRIOR TO MEALS AND AT BEDTIME PRN FOR ABDOMINAL OR CHEST PAIN. MAY REPEAT DOSE EVERY 4 HOURS. NO MORE THAN 8 DOSES A DAY. 300 mL 1  . omeprazole (PRILOSEC) 20 MG capsule 1 PO 30 MINS PRIOR TO FIRST MEAL (Patient taking differently: Take 20 mg by mouth every morning. 1 PO 30 MINS PRIOR TO FIRST MEAL) 30 capsule 11  . oxyCODONE (ROXICODONE) 5 MG immediate release tablet 1-2 po q6h prn chest pain 15 tablet 0  . prochlorperazine (COMPAZINE) 5 MG tablet Take 1 tablet (5 mg total) by mouth 2 (two) times daily as needed for nausea or vomiting (or headache). 10 tablet 0  . sildenafil (REVATIO) 20 MG tablet TAKE 1 TO 5 TABLETS BY MOUTH ONCE DAILY AS NEEDED 50 tablet 5   No current facility-administered medications for this visit.      Physical Exam  Blood pressure 125/78, pulse 83, height 6\' 2"  (1.88 m), weight 233 lb (105.7 kg).  Constitutional: overall normal hygiene, normal nutrition, well developed, normal grooming, normal body habitus. Assistive device:none  Musculoskeletal: gait and station Limp none, muscle tone and strength are normal, no tremors or atrophy is present.  .  Neurological: coordination overall normal.  Deep tendon reflex/nerve stretch intact.  Sensation normal.  Cranial nerves II-XII intact.   Skin:   Normal overall no scars, lesions, ulcers or rashes. No psoriasis.  Psychiatric: Alert and oriented x 3.  Recent memory intact, remote memory unclear.  Normal mood and affect. Well groomed.  Good eye contact.  Cardiovascular: overall no swelling, no varicosities, no  edema bilaterally, normal temperatures of the legs and arms, no clubbing, cyanosis and good capillary refill.  Lymphatic: palpation is normal.  The left shoulder has full motion but is painful in the extremes.  NV intact.  Neck has full motion.  All other systems reviewed and are negative   The patient has been educated about the nature of the problem(s) and counseled on treatment options.  The patient appeared to understand what I have discussed and is in agreement with it.  Encounter Diagnosis  Name Primary?  . Acute pain of left shoulder Yes  PLAN Call if any problems.  Precautions discussed.  Continue current medications.   Return to clinic 1 month   Continue OT/PT.  I have reviewed the [place] Controlled Substance Reporting System web site prior to prescribing narcotic medicine for this patient.  Electronically Signed Darreld Mclean, MD 2/21/20199:19 AM

## 2017-05-24 NOTE — Patient Instructions (Signed)
Steps to Quit Smoking Smoking tobacco can be bad for your health. It can also affect almost every organ in your body. Smoking puts you and people around you at risk for many serious long-lasting (chronic) diseases. Quitting smoking is hard, but it is one of the best things that you can do for your health. It is never too late to quit. What are the benefits of quitting smoking? When you quit smoking, you lower your risk for getting serious diseases and conditions. They can include:  Lung cancer or lung disease.  Heart disease.  Stroke.  Heart attack.  Not being able to have children (infertility).  Weak bones (osteoporosis) and broken bones (fractures).  If you have coughing, wheezing, and shortness of breath, those symptoms may get better when you quit. You may also get sick less often. If you are pregnant, quitting smoking can help to lower your chances of having a baby of low birth weight. What can I do to help me quit smoking? Talk with your doctor about what can help you quit smoking. Some things you can do (strategies) include:  Quitting smoking totally, instead of slowly cutting back how much you smoke over a period of time.  Going to in-person counseling. You are more likely to quit if you go to many counseling sessions.  Using resources and support systems, such as: ? Online chats with a counselor. ? Phone quitlines. ? Printed self-help materials. ? Support groups or group counseling. ? Text messaging programs. ? Mobile phone apps or applications.  Taking medicines. Some of these medicines may have nicotine in them. If you are pregnant or breastfeeding, do not take any medicines to quit smoking unless your doctor says it is okay. Talk with your doctor about counseling or other things that can help you.  Talk with your doctor about using more than one strategy at the same time, such as taking medicines while you are also going to in-person counseling. This can help make  quitting easier. What things can I do to make it easier to quit? Quitting smoking might feel very hard at first, but there is a lot that you can do to make it easier. Take these steps:  Talk to your family and friends. Ask them to support and encourage you.  Call phone quitlines, reach out to support groups, or work with a counselor.  Ask people who smoke to not smoke around you.  Avoid places that make you want (trigger) to smoke, such as: ? Bars. ? Parties. ? Smoke-break areas at work.  Spend time with people who do not smoke.  Lower the stress in your life. Stress can make you want to smoke. Try these things to help your stress: ? Getting regular exercise. ? Deep-breathing exercises. ? Yoga. ? Meditating. ? Doing a body scan. To do this, close your eyes, focus on one area of your body at a time from head to toe, and notice which parts of your body are tense. Try to relax the muscles in those areas.  Download or buy apps on your mobile phone or tablet that can help you stick to your quit plan. There are many free apps, such as QuitGuide from the CDC (Centers for Disease Control and Prevention). You can find more support from smokefree.gov and other websites.  This information is not intended to replace advice given to you by your health care provider. Make sure you discuss any questions you have with your health care provider. Document Released: 01/14/2009 Document   Revised: 11/16/2015 Document Reviewed: 08/04/2014 Elsevier Interactive Patient Education  2018 Elsevier Inc.  

## 2017-05-28 ENCOUNTER — Ambulatory Visit (HOSPITAL_COMMUNITY): Payer: Medicare HMO | Admitting: Specialist

## 2017-05-28 NOTE — Telephone Encounter (Signed)
Dr. Darrick PennaFields advised for pt's to have EGD with possible variceal banding. Called and informed Endo scheduler that variceal banding will be "possible".

## 2017-05-30 ENCOUNTER — Telehealth (HOSPITAL_COMMUNITY): Payer: Self-pay | Admitting: Occupational Therapy

## 2017-05-30 ENCOUNTER — Ambulatory Visit (HOSPITAL_COMMUNITY): Payer: Medicare HMO | Admitting: Occupational Therapy

## 2017-05-30 NOTE — Telephone Encounter (Signed)
Called pt regarding no show for 10:30 appt. Spoke to a woman who reports she forgot to call and cancel as he has a $40 copay. Reminded of next appt, states he will call if does not want to come.    Ezra SitesLeslie Serigne Kubicek, OTR/L  [phone] 05/30/2017

## 2017-06-04 ENCOUNTER — Ambulatory Visit (HOSPITAL_COMMUNITY): Payer: Medicare HMO | Attending: Orthopaedic Surgery | Admitting: Specialist

## 2017-06-04 ENCOUNTER — Telehealth (HOSPITAL_COMMUNITY): Payer: Self-pay | Admitting: Specialist

## 2017-06-04 NOTE — Telephone Encounter (Signed)
Called and left message regarding missed appointment.  Relayed next visit information.  Requested patient return call to verify if he is planning on attending next session.  Shirlean MylarBethany H. Murray, MHA, OTR/L [phone]

## 2017-06-05 ENCOUNTER — Other Ambulatory Visit: Payer: Self-pay | Admitting: Family Medicine

## 2017-06-05 DIAGNOSIS — R519 Headache, unspecified: Secondary | ICD-10-CM

## 2017-06-05 DIAGNOSIS — R51 Headache: Principal | ICD-10-CM

## 2017-06-05 MED ORDER — GABAPENTIN 100 MG PO CAPS: ORAL_CAPSULE | ORAL | 1 refills | Status: DC

## 2017-06-06 ENCOUNTER — Ambulatory Visit (HOSPITAL_COMMUNITY): Payer: Medicare HMO

## 2017-06-06 ENCOUNTER — Ambulatory Visit: Payer: Medicare HMO | Admitting: Nurse Practitioner

## 2017-06-06 ENCOUNTER — Telehealth (HOSPITAL_COMMUNITY): Payer: Self-pay

## 2017-06-06 ENCOUNTER — Encounter (HOSPITAL_COMMUNITY): Payer: Self-pay

## 2017-06-06 NOTE — Therapy (Signed)
[facility] [place], [place], [address] Phone: [phone]   Fax:  [phone]  Patient Details  Name: Christian Sanders MRN: [id number] Date of Birth: 1960-12-14 Referring Provider:  No ref. provider found  Encounter Date: 06/06/2017  OCCUPATIONAL THERAPY DISCHARGE SUMMARY  Visits from Start of Care: 1  Current functional level related to goals / functional outcomes: Patient did not return since initial evaluation on 05/21/17. Initially stating that $40 co-pay may be an issue; patient will be discharged due to 3 no shows with no communication. He will need a new MD referral if he wishes to return.     Plan: Patient agrees to discharge.  Patient goals were not met. Patient is being discharged due to not returning since the last visit.  ?????        Ailene Ravel, OTR/L,CBIS  [phone]  06/06/2017, 2:27 PM  [facility] [address] [place], [place], [address] Phone: [phone]   Fax:  [phone]

## 2017-06-06 NOTE — Telephone Encounter (Signed)
Patient had his 3rd no show this AM. Called and left a message regarding missed appointment. Per our attendance policy patient will be discharged from therapy services. Patient will need a new MD order if he wishes to continue.   Christian Sanders, OTR/L,CBIS  [phone]
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
## 2017-06-13 ENCOUNTER — Encounter (INDEPENDENT_AMBULATORY_CARE_PROVIDER_SITE_OTHER): Payer: Self-pay

## 2017-06-13 ENCOUNTER — Encounter (HOSPITAL_COMMUNITY): Payer: Self-pay | Admitting: Psychiatry

## 2017-06-13 ENCOUNTER — Ambulatory Visit (INDEPENDENT_AMBULATORY_CARE_PROVIDER_SITE_OTHER): Payer: Medicare HMO | Admitting: Psychiatry

## 2017-06-13 VITALS — BP 145/81 | HR 83 | Ht 74.0 in | Wt 228.0 lb

## 2017-06-13 DIAGNOSIS — F330 Major depressive disorder, recurrent, mild: Secondary | ICD-10-CM | POA: Diagnosis not present

## 2017-06-13 DIAGNOSIS — R69 Illness, unspecified: Secondary | ICD-10-CM | POA: Diagnosis not present

## 2017-06-13 DIAGNOSIS — F419 Anxiety disorder, unspecified: Secondary | ICD-10-CM | POA: Diagnosis not present

## 2017-06-13 DIAGNOSIS — F4310 Post-traumatic stress disorder, unspecified: Secondary | ICD-10-CM

## 2017-06-13 DIAGNOSIS — R450 Nervousness: Secondary | ICD-10-CM | POA: Diagnosis not present

## 2017-06-13 DIAGNOSIS — F410 Panic disorder [episodic paroxysmal anxiety] without agoraphobia: Secondary | ICD-10-CM | POA: Insufficient documentation

## 2017-06-13 DIAGNOSIS — F1021 Alcohol dependence, in remission: Secondary | ICD-10-CM | POA: Insufficient documentation

## 2017-06-13 DIAGNOSIS — K7030 Alcoholic cirrhosis of liver without ascites: Secondary | ICD-10-CM | POA: Diagnosis not present

## 2017-06-13 DIAGNOSIS — F17210 Nicotine dependence, cigarettes, uncomplicated: Secondary | ICD-10-CM | POA: Diagnosis not present

## 2017-06-13 MED ORDER — LURASIDONE HCL 20 MG PO TABS: 20.0000 mg | ORAL_TABLET | Freq: Every day | ORAL | 1 refills | Status: DC

## 2017-06-13 MED ORDER — ALPRAZOLAM 0.5 MG PO TABS: 0.2500 mg | ORAL_TABLET | Freq: Two times a day (BID) | ORAL | 1 refills | Status: DC | PRN

## 2017-06-13 MED ORDER — SERTRALINE HCL 50 MG PO TABS: ORAL_TABLET | ORAL | 1 refills | Status: DC

## 2017-06-13 NOTE — Patient Instructions (Signed)
1. Start sertraline 25 mg at night for one week, then 50 mg at night (after discussing with your doctor) 2. Continue latuda 20 mg daily 3. Continue Xanax 0.5 mg twice a day as needed for anxiety 4. Return to clinic in six weeks for 30 mins

## 2017-06-20 ENCOUNTER — Encounter (HOSPITAL_COMMUNITY)
Admission: RE | Admit: 2017-06-20 | Discharge: 2017-06-20 | Disposition: A | Discharge status: Home / Self Care | Payer: Medicare HMO | Source: Ambulatory Visit | Attending: Gastroenterology | Admitting: Gastroenterology

## 2017-06-20 ENCOUNTER — Encounter (HOSPITAL_COMMUNITY): Payer: Self-pay

## 2017-06-20 DIAGNOSIS — Z01812 Encounter for preprocedural laboratory examination: Secondary | ICD-10-CM | POA: Insufficient documentation

## 2017-06-20 DIAGNOSIS — B182 Chronic viral hepatitis C: Secondary | ICD-10-CM | POA: Diagnosis not present

## 2017-06-20 DIAGNOSIS — K7469 Other cirrhosis of liver: Secondary | ICD-10-CM | POA: Diagnosis not present

## 2017-06-25 ENCOUNTER — Encounter: Payer: Self-pay | Admitting: Orthopedic Surgery

## 2017-06-25 ENCOUNTER — Telehealth: Payer: Self-pay

## 2017-06-25 ENCOUNTER — Ambulatory Visit (INDEPENDENT_AMBULATORY_CARE_PROVIDER_SITE_OTHER): Payer: Medicare HMO | Admitting: Orthopedic Surgery

## 2017-06-25 ENCOUNTER — Telehealth: Payer: Self-pay | Admitting: Radiology

## 2017-06-25 VITALS — BP 123/82 | HR 93 | Ht 74.0 in | Wt 233.0 lb

## 2017-06-25 DIAGNOSIS — M75122 Complete rotator cuff tear or rupture of left shoulder, not specified as traumatic: Secondary | ICD-10-CM

## 2017-06-25 DIAGNOSIS — M25512 Pain in left shoulder: Secondary | ICD-10-CM | POA: Diagnosis not present

## 2017-06-25 DIAGNOSIS — G8929 Other chronic pain: Secondary | ICD-10-CM | POA: Diagnosis not present

## 2017-06-25 MED ORDER — OXYCODONE HCL 5 MG PO TABS: 5.0000 mg | ORAL_TABLET | Freq: Four times a day (QID) | ORAL | 0 refills | Status: DC | PRN

## 2017-06-25 NOTE — Telephone Encounter (Signed)
I called patient MRI was approved for [facility], gave him the number to call and schedule / told him to call schedule, then call back here to make appointment with Dr Romeo AppleHarrison.

## 2017-06-25 NOTE — Telephone Encounter (Signed)
Endo scheduler requested to see if pt could arrive earlier tomorrow for EGD/-/+var banding d/t cancellation. Called pt. He will arrive tomorrow at 7:45am for 9:45am procedure. Advised him to be NPO after midnight tonight d/t time change. LMOVM and informed endo scheduler.

## 2017-06-25 NOTE — Patient Instructions (Signed)
What You Need to Know About Prescription Opioid Pain Medicine        Please be advised. You are on a medication which is classified as an "opiod". The CDC the [place] [facility]  has recently advised all providers to advise patient's that these medications have certain risks which include but are not limited to:    drug intolerance  drug addiction  respiratory depression   respiratory failure  Death  Please keep these medications locked away. If you feel that you are becoming addicted to these medicines or you are having difficulties with these medications please alert your provider.   As your provider I will attempt to wean you off of these medications when you're severe acute pain has been taking care of. However, if we cannot wean you off of this medication you will be sent to a pain management center where they can better manage chronic pain   Opioids are powerful medicines that are used to treat moderate to severe pain. Opioids should be taken with the supervision of a trained health care provider. They should be taken for the shortest period of time as possible. This is because opioids can be addictive and the longer you take opioids, the greater your risk of addiction (opioid use disorder). What do opioids do? Opioids help reduce or eliminate pain. When used for short periods of time, they can help you:  Sleep better.  Do better in physical or occupational therapy.  Feel better in the first few days after an injury.  Recover from surgery. What kind of problems can opioids cause? Opioids can cause side effects, such as:  Constipation.  Nausea.  Vomiting.  Drowsiness.  Confusion.  Opioid use disorder.  Breathing difficulties (respiratory depression). Using opioid pain medicines for longer than 3 days increases your risk of these side effects. Taking opioid pain medicine for a long period of time can affect your ability to do daily tasks. It also  puts you at risk for:  Car accidents.  Heart attack.  Overdose, which can sometimes lead to death. What can increase my risk for developing problems while taking opioids? You may be at an especially high risk for problems while taking opioids if you:  Are over the age of 65.  Are pregnant.  Have kidney or liver disease.  Have certain mental health conditions, such as depression or anxiety.  Have a history of substance use disorder.  Have had an opioid overdose in the past. How do I stop taking opioids if I have been taking them for a long time? If you have been taking opioid medicine for more than a few weeks, you may need to slowly stop taking them (taper). Tapering your use of opioids can decrease your chances of experiencing withdrawal symptoms, such as:  Abdominal pain and cramping.  Nausea.  Sweating.  Sleepiness.  Restlessness.  Uncontrollable shaking (tremors).  Cravings for the medicine. Do not attempt to taper your use of opioids on your own. Talk with your health care provider about how to do this. Your health care provider may prescribe a step-down schedule based on how much medicine you are taking and how long you have been taking it. What are the benefits of stopping the use of opioids? By switching from opioid pain medicine to non-opioid pain management options, you will decrease your risk of accidents and injuries associated with long-term opioid use. You will also be able to:  Monitor your pain more accurately and know when to   seek medical care if it is not improving.  Decrease risk to others around you. Having opioids in the home increases the risk for accidental or intentional use or overdose by others. How can I treat pain without opioids? Pain can be managed with many types of alternative treatments. Ask your health care provider to refer you to one or more specialists who can help you manage pain through:  Physical or occupational  therapy.  Counseling (cognitive-behavioral therapy).  Good nutrition.  Biofeedback.  Massage.  Meditation.  Non-opioid medicine.  Following a gentle exercise program. Where can I get support? If you have been taking opioids for a long time, you may benefit from receiving support for quitting from a local support group or counselor. Ask your health care provider for a referral to these resources in your area. When should I seek medical care? Seek medical care right away if you are taking opioids and you experience any of the following:  Difficulty breathing.  Breathing that is more shallow or slower than normal.  A very slow heartbeat (pulse).  Severe confusion.  Unconsciousness.  Sleepiness.  Difficulty waking from sleep.  Slurred speech.  Nausea and vomiting.  Cold, clammy skin.  Blue lips or fingernails.  Limpness.  Abnormally small pupils. If you think that you or someone else may have taken too much of an opioid medicine, get medical help right away. Do not wait to see if the symptoms go away on their own. Call your local emergency services (911 in the [place] or call the hotline of the National Poison Control Center (800-222-1222 in the [place]  Where can I get more information? To learn more about opioid medicines, visit the Centers for Disease Control and Prevention web site Opioid Basics at https://www.cdc.gov/drugoverdos/opioids/index.html. Summary  Opioid medicines can help you manage moderate-to-severe pain for a short period of time.  Taking opioid pain medicine for a long period of time puts you at risk for unintentional accidents, injury, and even death.  If you think that you or someone else may have taken too much of an opioid, get medical help right away. This information is not intended to replace advice given to you by your health care provider. Make sure you discuss any questions you have with your health care provider. Document Released:  04/16/2015 Document Revised: 11/12/2015 Document Reviewed: 10/30/2014 Elsevier Interactive Patient Education  2017 Elsevier Inc.   

## 2017-06-25 NOTE — Progress Notes (Signed)
Progress Note   Patient ID: Christian Sanders, male   DOB: July 23, 1960, 57 y.o.   MRN: [id number]  Chief Complaint  Patient presents with  . Follow-up    Recheck on left shoulder    Christian Sanders is a 57 y.o. male who has left shoulder pain. He has been to OT and is improving.  He has pain still and pain with overhead use but he can raise his arm up now.  He has no new trauma.     He presents back after physical therapy.  He still cannot completely lift his arm he says his range of motion is improved from 0 degrees abduction to 90 degrees abduction.  He still cannot get the arm above his head.  He still requiring oxycodone for pain relief  So his pain is still in the severe category is nonradiating it is isolated over the anterolateral shoulder and has been present now since 05/17/2017        Review of Systems  Constitutional: Negative for fever.  Musculoskeletal: Positive for myalgias.  Skin: Negative.   Neurological: Negative for tingling and sensory change.   No outpatient medications have been marked as taking for the 06/25/17 encounter (Office Visit) with Vickki Hearing, MD.    Allergies  Allergen Reactions  . Bee Venom Swelling and Other (See Comments)    Throat and Tongue   . Ibuprofen Other (See Comments)    Due to liver  . Pineapple Nausea And Vomiting  . Pollen Extract Other (See Comments)    Runny nose, itchy, watery eyes, congestion  . Tylenol [Acetaminophen] Other (See Comments)    Due to liver     BP 123/82   Pulse 93   Ht 6\' 2"  (1.88 m)   Wt 233 lb (105.7 kg)   BMI 29.92 kg/m   Physical Exam  Constitutional: He is oriented to person, place, and time. He appears well-developed and well-nourished.  Vital signs have been reviewed and are stable. Gen. appearance the patient is well-developed and well-nourished with normal grooming and hygiene.   Musculoskeletal:       Arms: Neurological: He is alert and oriented to person, place, and time.  Skin: Skin is  warm and dry. No erythema.  Psychiatric: He has a normal mood and affect.  Vitals reviewed.    Medical decision-making Encounter Diagnoses  Name Primary?  . Acute pain of left shoulder   . Complete tear of left rotator cuff Yes    Imaging:  FINDINGS: There is no evidence of fracture or dislocation. There is no evidence of arthropathy or other focal bone abnormality. Soft tissues are unremarkable.   IMPRESSION: Negative.     Electronically Signed   By: Bary Richard M.D.   On: 05/11/2017 11:25   Patient has had adequate physical therapy and occupational therapy for his left shoulder he had a subacromial injection.  He cannot take NSAIDs for medical reasons  Recommend MRI left shoulder presumptive diagnosis torn left rotator cuff  Databank checked.  Discussion with patient regarding opioid use and abuse and potential for addiction dependence and respiratory failure and death  Plan is to wean him off of the opioids over the next several weeks.   Meds ordered this encounter  Medications  . oxyCODONE (ROXICODONE) 5 MG immediate release tablet    Sig: Take 1 tablet (5 mg total) by mouth every 6 (six) hours as needed for severe pain.    Dispense:  28 tablet    Refill:  0     Fuller CanadaStanley Iya Hamed, MD 06/25/2017 9:22 AM

## 2017-06-26 ENCOUNTER — Encounter (HOSPITAL_COMMUNITY): Payer: Self-pay | Admitting: *Deleted

## 2017-06-26 ENCOUNTER — Telehealth: Payer: Self-pay | Admitting: Gastroenterology

## 2017-06-26 ENCOUNTER — Ambulatory Visit (HOSPITAL_COMMUNITY): Payer: Medicare HMO | Admitting: Anesthesiology

## 2017-06-26 ENCOUNTER — Encounter (HOSPITAL_COMMUNITY)
Admission: RE | Disposition: A | Discharge status: Home / Self Care | Payer: Self-pay | Source: Ambulatory Visit | Attending: Gastroenterology

## 2017-06-26 ENCOUNTER — Ambulatory Visit (HOSPITAL_COMMUNITY)
Admission: RE | Admit: 2017-06-26 | Discharge: 2017-06-26 | Disposition: A | Discharge status: Home / Self Care | Payer: Medicare HMO | Source: Ambulatory Visit | Attending: Gastroenterology | Admitting: Gastroenterology

## 2017-06-26 DIAGNOSIS — F17210 Nicotine dependence, cigarettes, uncomplicated: Secondary | ICD-10-CM | POA: Diagnosis not present

## 2017-06-26 DIAGNOSIS — F319 Bipolar disorder, unspecified: Secondary | ICD-10-CM | POA: Insufficient documentation

## 2017-06-26 DIAGNOSIS — Z886 Allergy status to analgesic agent status: Secondary | ICD-10-CM | POA: Diagnosis not present

## 2017-06-26 DIAGNOSIS — Z7951 Long term (current) use of inhaled steroids: Secondary | ICD-10-CM | POA: Insufficient documentation

## 2017-06-26 DIAGNOSIS — I10 Essential (primary) hypertension: Secondary | ICD-10-CM | POA: Diagnosis not present

## 2017-06-26 DIAGNOSIS — K766 Portal hypertension: Secondary | ICD-10-CM

## 2017-06-26 DIAGNOSIS — Z79899 Other long term (current) drug therapy: Secondary | ICD-10-CM | POA: Diagnosis not present

## 2017-06-26 DIAGNOSIS — F4310 Post-traumatic stress disorder, unspecified: Secondary | ICD-10-CM | POA: Diagnosis not present

## 2017-06-26 DIAGNOSIS — R69 Illness, unspecified: Secondary | ICD-10-CM | POA: Diagnosis not present

## 2017-06-26 DIAGNOSIS — I8510 Secondary esophageal varices without bleeding: Secondary | ICD-10-CM | POA: Diagnosis not present

## 2017-06-26 DIAGNOSIS — I8500 Esophageal varices without bleeding: Secondary | ICD-10-CM | POA: Insufficient documentation

## 2017-06-26 DIAGNOSIS — K3189 Other diseases of stomach and duodenum: Secondary | ICD-10-CM | POA: Diagnosis not present

## 2017-06-26 DIAGNOSIS — K7460 Unspecified cirrhosis of liver: Secondary | ICD-10-CM | POA: Diagnosis not present

## 2017-06-26 DIAGNOSIS — K219 Gastro-esophageal reflux disease without esophagitis: Secondary | ICD-10-CM | POA: Diagnosis not present

## 2017-06-26 HISTORY — PX: ESOPHAGOGASTRODUODENOSCOPY (EGD) WITH PROPOFOL: SHX5813

## 2017-06-26 HISTORY — PX: ESOPHAGEAL BANDING: SHX5518

## 2017-06-26 SURGERY — ESOPHAGOGASTRODUODENOSCOPY (EGD) WITH PROPOFOL
Anesthesia: Monitor Anesthesia Care

## 2017-06-26 MED ORDER — FENTANYL CITRATE (PF) 100 MCG/2ML IJ SOLN: 25.0000 ug | INTRAMUSCULAR | Status: DC | PRN

## 2017-06-26 MED ORDER — MIDAZOLAM HCL 2 MG/2ML IJ SOLN
INTRAMUSCULAR | Status: AC
  Filled 2017-06-26: qty 2

## 2017-06-26 MED ORDER — FENTANYL CITRATE (PF) 100 MCG/2ML IJ SOLN
25.0000 ug | Freq: Once | INTRAMUSCULAR | Status: AC
  Administered 2017-06-26: 25 ug via INTRAVENOUS

## 2017-06-26 MED ORDER — CIPROFLOXACIN HCL 500 MG PO TABS: ORAL_TABLET | ORAL | 0 refills | Status: DC

## 2017-06-26 MED ORDER — MIDAZOLAM HCL 5 MG/5ML IJ SOLN
INTRAMUSCULAR | Status: DC | PRN
  Administered 2017-06-26: 2 mg via INTRAVENOUS

## 2017-06-26 MED ORDER — LACTATED RINGERS IV SOLN
INTRAVENOUS | Status: DC
  Administered 2017-06-26: 09:00:00 via INTRAVENOUS

## 2017-06-26 MED ORDER — CIPROFLOXACIN IN D5W 400 MG/200ML IV SOLN
INTRAVENOUS | Status: AC
  Filled 2017-06-26: qty 200

## 2017-06-26 MED ORDER — FENTANYL CITRATE (PF) 100 MCG/2ML IJ SOLN
INTRAMUSCULAR | Status: AC
  Filled 2017-06-26: qty 2

## 2017-06-26 MED ORDER — MIDAZOLAM HCL 2 MG/2ML IJ SOLN
1.0000 mg | INTRAMUSCULAR | Status: AC
  Administered 2017-06-26: 2 mg via INTRAVENOUS

## 2017-06-26 MED ORDER — PROPOFOL 10 MG/ML IV BOLUS
INTRAVENOUS | Status: AC
  Filled 2017-06-26: qty 20

## 2017-06-26 MED ORDER — PROPOFOL 500 MG/50ML IV EMUL
INTRAVENOUS | Status: DC | PRN
  Administered 2017-06-26: 150 ug/kg/min via INTRAVENOUS
  Administered 2017-06-26: 10:00:00 via INTRAVENOUS

## 2017-06-26 MED ORDER — CIPROFLOXACIN IN D5W 400 MG/200ML IV SOLN
INTRAVENOUS | Status: DC | PRN
  Administered 2017-06-26: 400 mg via INTRAVENOUS

## 2017-06-26 MED ORDER — LIDOCAINE VISCOUS 2 % MT SOLN
OROMUCOSAL | Status: DC | PRN
  Administered 2017-06-26: 6 mL via OROMUCOSAL

## 2017-06-26 MED ORDER — LIDOCAINE VISCOUS 2 % MT SOLN
OROMUCOSAL | Status: AC
  Filled 2017-06-26: qty 15

## 2017-06-26 NOTE — Anesthesia Preprocedure Evaluation (Signed)
Anesthesia Evaluation  Patient identified by MRN, date of birth, ID band Patient awake    Reviewed: Allergy & Precautions, NPO status , Patient's Chart, lab work & pertinent test results  Airway Mallampati: II  TM Distance: >3 FB Neck ROM: Full    Dental  (+) Edentulous Upper   Pulmonary Current Smoker,    breath sounds clear to auscultation       Cardiovascular hypertension, Pt. on medications  Rhythm:Regular Rate:Normal     Neuro/Psych  Headaches, PSYCHIATRIC DISORDERS (PTSD ) Anxiety Depression Bipolar Disorder    GI/Hepatic GERD  ,(+) Cirrhosis  ( Portal hypertension )  Esophageal Varices    ,   Endo/Other    Renal/GU      Musculoskeletal 2 crushed thoracic vertebra   Abdominal   Peds  Hematology   Anesthesia Other Findings   Reproductive/Obstetrics                             Anesthesia Physical Anesthesia Plan  ASA: IV  Anesthesia Plan: MAC   Post-op Pain Management:    Induction: Intravenous  PONV Risk Score and Plan:   Airway Management Planned: Simple Face Mask  Additional Equipment:   Intra-op Plan:   Post-operative Plan:   Informed Consent: I have reviewed the patients History and Physical, chart, labs and discussed the procedure including the risks, benefits and alternatives for the proposed anesthesia with the patient or authorized representative who has indicated his/her understanding and acceptance.     Plan Discussed with:   Anesthesia Plan Comments:         Anesthesia Quick Evaluation

## 2017-06-26 NOTE — Telephone Encounter (Signed)
Pt had procedure done today and wife called this afternoon to say the pharmacy ([facility] [place]) hasn't received his prescription for Lidocaine or what he needed for his chest pains. Please advise and call [phone]

## 2017-06-26 NOTE — Anesthesia Postprocedure Evaluation (Signed)
Anesthesia Post Note  Patient: Darrian Grzelak  Procedure(s) Performed: ESOPHAGOGASTRODUODENOSCOPY (EGD) WITH PROPOFOL (N/A ) ESOPHAGEAL BANDING (N/A )  Patient location during evaluation: PACU Anesthesia Type: MAC Level of consciousness: awake, awake and alert, oriented, lethargic and patient cooperative Pain management: pain level controlled Vital Signs Assessment: post-procedure vital signs reviewed and stable Respiratory status: spontaneous breathing, nonlabored ventilation, respiratory function stable and patient connected to nasal cannula oxygen Cardiovascular status: stable Postop Assessment: no apparent nausea or vomiting Anesthetic complications: no     Last Vitals:  Vitals:   06/26/17 0845 06/26/17 0850  BP:    Resp: 15 17  Temp:    SpO2: 98% 98%    Last Pain:  Vitals:   06/26/17 0803  TempSrc: Oral                 Jalayah Gutridge L

## 2017-06-26 NOTE — H&P (Signed)
Primary Care Physician:  Smitty Cords, DO Primary Gastroenterologist:  Dr. Darrick Penna  Pre-Procedure History & Physical: HPI:  Christian Sanders is a 57 y.o. male here for SCREENING FOR VARICES. LAST EGD W/ EBL x3 FEB 2019.  Past Medical History:  Diagnosis Date  . Abnormal CT of liver 12/20/2016  . Anxiety   . Bipolar disorder (HCC)   . Dentures complicating chewing    full upper, partial lower  . Depression   . Esophageal varices determined by endoscopy (HCC)   . Gastritis and duodenitis   . GERD (gastroesophageal reflux disease)   . Headache    chronic, s/p facial injury and reconstruction  . Hypertension   . Portal hypertensive gastropathy (HCC)   . PTSD (post-traumatic stress disorder)   . Vertebral column disorder    2 crushed thoracic vertebra    Past Surgical History:  Procedure Laterality Date  . BIOPSY  04/17/2017   Procedure: BIOPSY;  Surgeon: West Bali, MD;  Location: [facility];  Service: Endoscopy;;  gastric for h pylori  . COLONOSCOPY WITH PROPOFOL N/A 10/09/2014   Procedure: COLONOSCOPY WITH PROPOFOL;  Surgeon: Midge Minium, MD;  Location: [facility];  Service: Endoscopy;  Laterality: N/A;  . ESOPHAGEAL BANDING N/A 04/17/2017   Procedure: ESOPHAGEAL BANDING;  Surgeon: West Bali, MD;  Location: [facility];  Service: Endoscopy;  Laterality: N/A;  . ESOPHAGEAL BANDING N/A 05/22/2017   Procedure: ESOPHAGEAL BANDING;  Surgeon: West Bali, MD;  Location: [facility];  Service: Endoscopy;  Laterality: N/A;  . ESOPHAGOGASTRODUODENOSCOPY (EGD) WITH PROPOFOL N/A 04/17/2017   Procedure: ESOPHAGOGASTRODUODENOSCOPY (EGD) WITH PROPOFOL;  Surgeon: West Bali, MD;  Location: [facility];  Service: Endoscopy;  Laterality: N/A;  10:45am  . ESOPHAGOGASTRODUODENOSCOPY (EGD) WITH PROPOFOL N/A 05/22/2017   Procedure: ESOPHAGOGASTRODUODENOSCOPY (EGD) WITH PROPOFOL;  Surgeon: West Bali, MD;  Location: [facility];  Service: Endoscopy;   Laterality: N/A;  . FACIAL RECONSTRUCTION SURGERY     eye socket with 2 metal plates    Prior to Admission medications   Medication Sig Start Date End Date Taking? Authorizing Provider  ALPRAZolam Prudy Feeler) 0.5 MG tablet Take 0.5-1 tablets (0.25-0.5 mg total) by mouth 2 (two) times daily as needed for anxiety. 06/26/17  Yes Hisada, Barbee Cough, MD  amLODipine (NORVASC) 5 MG tablet Take 1 tablet (5 mg total) by mouth daily. 05/08/17  Yes Karamalegos, Netta Neat, DO  fluticasone (FLONASE) 50 MCG/ACT nasal spray Place 2 sprays into both nostrils daily. Use for 4-6 weeks then stop and use seasonally or as needed. 05/08/17  Yes Karamalegos, Netta Neat, DO  gabapentin (NEURONTIN) 100 MG capsule Start 1 capsule daily, increase by 1 cap every 2-3 days as tolerated up to 3 times a day, or may take 3 at once in evening. Patient taking differently: Take 100 mg by mouth 2 (two) times daily.  06/05/17  Yes Karamalegos, Netta Neat, DO  lurasidone (LATUDA) 20 MG TABS tablet Take 1 tablet (20 mg total) by mouth daily. 06/13/17  Yes Hisada, Barbee Cough, MD  omeprazole (PRILOSEC) 20 MG capsule 1 PO 30 MINS PRIOR TO FIRST MEAL Patient taking differently: Take 20 mg by mouth daily before breakfast.  04/17/17  Yes Stefhanie Kachmar L, MD  oxyCODONE (ROXICODONE) 5 MG immediate release tablet Take 1 tablet (5 mg total) by mouth every 6 (six) hours as needed for severe pain. 06/25/17  Yes Vickki Hearing, MD  sertraline (ZOLOFT) 50 MG tablet 25 mg at night for  one week, then 50 mg daily 06/13/17  Yes Hisada, Barbee Cough, MD  sildenafil (REVATIO) 20 MG tablet TAKE 1 TO 5 TABLETS BY MOUTH ONCE DAILY AS NEEDED Patient taking differently: Take 20-100 mg by mouth as needed (for ED).  05/08/17  Yes Karamalegos, Netta Neat, DO  diclofenac sodium (VOLTAREN) 1 % GEL Apply to left shoulder front and back tid Patient not taking: Reported on 06/11/2017 05/11/17   Ivery Quale, PA-C  lidocaine (XYLOCAINE) 2 % solution 2 TSP  PO 30 MINUTES PRIOR TO MEALS AND AT  BEDTIME PRN FOR ABDOMINAL OR CHEST PAIN. MAY REPEAT DOSE EVERY 4 HOURS. NO MORE THAN 8 DOSES A DAY. Patient not taking: Reported on 06/11/2017 05/22/17   West Bali, MD  prochlorperazine (COMPAZINE) 5 MG tablet Take 1 tablet (5 mg total) by mouth 2 (two) times daily as needed for nausea or vomiting (or headache). 05/02/17   Samuel Jester, DO    Allergies as of 05/23/2017 - Review Complete 05/22/2017  Allergen Reaction Noted  . Bee venom Swelling and Other (See Comments) 12/28/2015  . Ibuprofen  05/11/2017  . Pineapple Nausea And Vomiting 10/06/2014  . Pollen extract Other (See Comments) 06/03/2015  . Tylenol [acetaminophen]  05/11/2017    Family History  Problem Relation Age of Onset  . Leukemia Father   . Colon cancer Neg Hx   . Liver disease Neg Hx     Social History   Socioeconomic History  . Marital status: Single    Spouse name: Not on file  . Number of children: Not on file  . Years of education: Not on file  . Highest education level: Not on file  Occupational History  . Not on file  Social Needs  . Financial resource strain: Not on file  . Food insecurity:    Worry: Not on file    Inability: Not on file  . Transportation needs:    Medical: Not on file    Non-medical: Not on file  Tobacco Use  . Smoking status: Current Every Day Smoker    Packs/day: 0.25    Years: 30.00    Pack years: 7.50    Types: Cigarettes  . Smokeless tobacco: Never Used  Substance and Sexual Activity  . Alcohol use: No    Alcohol/week: 3.6 oz    Types: 6 Cans of beer per week    Frequency: Never    Comment: None as of 03/08/17; previously 1-1.5 cases of beer a day in his 17s.  . Drug use: No  . Sexual activity: Never    Birth control/protection: None  Lifestyle  . Physical activity:    Days per week: Not on file    Minutes per session: Not on file  . Stress: Not on file  Relationships  . Social connections:    Talks on phone: Not on file    Gets together: Not on file     Attends religious service: Not on file    Active member of club or organization: Not on file    Attends meetings of clubs or organizations: Not on file    Relationship status: Not on file  . Intimate partner violence:    Fear of current or ex partner: Not on file    Emotionally abused: Not on file    Physically abused: Not on file    Forced sexual activity: Not on file  Other Topics Concern  . Not on file  Social History Narrative  . Not on file  Review of Systems: See HPI, otherwise negative ROS   Physical Exam: BP 126/84   Temp 98 F (36.7 C) (Oral)   Resp 17   SpO2 98%  General:   Alert,  pleasant and cooperative in NAD Head:  Normocephalic and atraumatic. Neck:  Supple; Lungs:  Clear throughout to auscultation.    Heart:  Regular rate and rhythm. Abdomen:  Soft, nontender and nondistended. Normal bowel sounds, without guarding, and without rebound.   Neurologic:  Alert and  oriented x4;  grossly normal neurologically.  Impression/Plan:     SCREENING VARICES  PLAN:  1.EGD TODAY DISCUSSED PROCEDURE, BENEFITS, & RISKS: < 1% chance of medication reaction, bleeding, OR perforation.

## 2017-06-26 NOTE — Discharge Instructions (Signed)
You have esophageal varices. I PLACED TWO BANDS.    AVOID OMEPRAZOLE during therapy FOR HEPATITIS C. Do not take OTC acid reducers or other vitamins or supplements during treatment FOR HEPATITIS C. Call [facility] LIVER CARE PRIOR TO ADDING NEW MEDS WHEN TREATMENT STARTS FOR HEP C.   DURING TREATMENT FOR HEPATITIS C, USE VISCOUS LIDOCAINE 2 TSP EVERY 4 HOURS WHEN NEEDED FOR FLARES OF HEARTBURN, CHEST PAIN, OR UPPER ABDOMINAL PAIN    TAKE ALL OF YOUR CIPRO AS PRESCRIBED.  REPEAT EGD TO ASSESS VARICES IN 3 MOS.  FOLLOW UP IN AUGUST 2019.  UPPER ENDOSCOPY AFTER CARE Read the instructions outlined below and refer to this sheet in the next week. These discharge instructions provide you with general information on caring for yourself after you leave the hospital. While your treatment has been planned according to the most current medical practices available, unavoidable complications occasionally occur. If you have any problems or questions after discharge, call DR. Axxel Gude, [phone].  ACTIVITY  You may resume your regular activity, but move at a slower pace for the next 24 hours.   Take frequent rest periods for the next 24 hours.   Walking will help get rid of the air and reduce the bloated feeling in your belly (abdomen).   No driving for 24 hours (because of the medicine (anesthesia) used during the test).   You may shower.   Do not sign any important legal documents or operate any machinery for 24 hours (because of the anesthesia used during the test).    NUTRITION  Drink plenty of fluids.   You may resume your normal diet as instructed by your doctor.   Begin with a light meal and progress to your normal diet. Heavy or fried foods are harder to digest and may make you feel sick to your stomach (nauseated).   Avoid alcoholic beverages for 24 hours or as instructed.    MEDICATIONS  You may resume your normal medications.   WHAT YOU CAN EXPECT TODAY  Some feelings of  bloating in the abdomen.   Passage of more gas than usual.    IF YOU HAD A BIOPSY TAKEN DURING THE UPPER ENDOSCOPY:  Eat a soft diet IF YOU HAVE NAUSEA, BLOATING, ABDOMINAL PAIN, OR VOMITING.    FINDING OUT THE RESULTS OF YOUR TEST Not all test results are available during your visit. DR. Darrick Penna WILL CALL YOU WITHIN 7 DAYS OF YOUR PROCEDUE WITH YOUR RESULTS. Do not assume everything is normal if you have not heard from DR. Mykeal Carrick IN ONE WEEK, CALL HER OFFICE AT [phone].  SEEK IMMEDIATE MEDICAL ATTENTION AND CALL THE OFFICE: [phone] IF:  You have more than a spotting of blood in your stool.   Your belly is swollen (abdominal distention).   You are nauseated or vomiting.   You have a temperature over 101F.   You have abdominal pain or discomfort that is severe or gets worse throughout the day.  Nadolol tablets  What is this medicine?   NADOLOL (nay DOE lole) is a beta-blocker. Beta-blockers reduce the workload on the heart and help it to beat more regularly. This medicine is used to treat high blood pressure and to relieve chest pain caused by angina.  This medicine may be used for other purposes; ask your health care provider or pharmacist if you have questions.   How should I use this medicine?  Take this medicine by mouth with a glass of water. Follow the directions on the prescription  label. You can take this medicine with or without food. Take your doses at regular intervals. Do not take your medicine more often than directed. Do not stop taking this medicine suddenly. This could lead to serious heart-related effects.    What if I miss a dose?   If you miss a dose, take it as soon as you can. If it is almost time for your next dose, take only that dose. Do not take double or extra doses.   What should I watch for?  Visit your doctor or health care professional for regular check ups. Check your blood pressure and pulse rate regularly. Ask your health care  professional what your blood pressure and pulse rate should be, and when you should contact them. You may get drowsy or dizzy. Do not drive, use machinery, or do anything that needs mental alertness until you know how this drug affects you. Do not stand or sit up quickly, especially if you are an older patient. This reduces the risk of dizzy or fainting spells. Alcohol can make you more drowsy and dizzy. Avoid alcoholic drinks.    What side effects may I notice from receiving this medicine?  Side effects that you should report to your doctor or health care professional as soon as possible:  -allergic reactions like skin rash, itching or hives  -cold, tingling, or numb hands or feet  -difficulty breathing, wheezing  -irregular heart beat  -mental depression  -slow heart rate  -swelling of the legs or ankles  -FATIGUE -change in sex drive or performance  -dry itching skin  -headache

## 2017-06-26 NOTE — Op Note (Signed)
[facility] Patient Name: Christian Sanders Procedure Date: 06/26/2017 9:29 AM MRN: [id number] Date of Birth: 12-05-1960 Attending MD: Jonette Eva MD, MD CSN: [id number] Age: 57 Admit Type: Outpatient Procedure:                Upper GI endoscopy WITH ENDOSCOPIC BAND LIGATION Indications:              Follow-up of esophageal varices. USUALLY HAS CHEST                            PAIN AFTER EVL. Providers:                Jonette Eva MD, MD, Jannett Celestine, RN, Dyann Ruddle Referring MD:             Smitty Cords Medicines:                Propofol per Anesthesia, CIPRO 400 MG IV Complications:            No immediate complications. Estimated Blood Loss:     Estimated blood loss was minimal. Procedure:                Pre-Anesthesia Assessment:                           - Prior to the procedure, a History and Physical                            was performed, and patient medications and                            allergies were reviewed. The patient's tolerance of                            previous anesthesia was also reviewed. The risks                            and benefits of the procedure and the sedation                            options and risks were discussed with the patient.                            All questions were answered, and informed consent                            was obtained. Prior Anticoagulants: The patient has                            taken no previous anticoagulant or antiplatelet                            agents. ASA Grade Assessment: II - A patient with                            mild systemic disease. After reviewing the risks  and benefits, the patient was deemed in                            satisfactory condition to undergo the procedure.                            After obtaining informed consent, the endoscope was                            passed under direct vision. Throughout the   procedure, the patient's blood pressure, pulse, and                            oxygen saturations were monitored continuously. The                            [id number] ([id number]) scope was introduced through the                            mouth, and advanced to the second part of duodenum.                            The upper GI endoscopy was accomplished without                            difficulty. The patient tolerated the procedure                            well. Scope In: 10:04:25 AM Scope Out: 10:18:53 AM Total Procedure Duration: 0 hours 14 minutes 28 seconds  Findings:      Grade III varices were found in the lower third of the esophagus. Two       bands were successfully placed with complete eradication, resulting in       deflation of varices. Bleeding had stopped at the end of the maneuver.      Moderate portal hypertensive gastropathy was found in the gastric       fundus, in the gastric body and in the gastric antrum.      The examined duodenum was normal. Impression:               - Grade III esophageal varices. Completely                            eradicated. Banded x2.                           - Portal hypertensive gastropathy. Moderate Sedation:      Per Anesthesia Care Recommendation:           - Repeat upper endoscopy in 3 months for endoscopic                            band ligation.                           - Resume previous diet.                           -  Continue present medications. CIPRO 500 MG BID                            FOR 7 DAYS. NO OMEPRAZOLE OR H2B DURING Rx FOR HEP                            C. USE VISCOUS LIDOCAINE IF NEEDED FOR                            HEARTBURN/ABDOMINAL PAIN.                           - Return to my office in 6 months.                           - Patient has a contact number available for                            emergencies. The signs and symptoms of potential                            delayed complications were  discussed with the                            patient. Return to normal activities tomorrow.                            Written discharge instructions were provided to the                            patient. Procedure Code(s):        --- Professional ---                           [id number], Esophagogastroduodenoscopy, flexible,                            transoral; with band ligation of esophageal/gastric                            varices Diagnosis Code(s):        --- Professional ---                           I85.00, Esophageal varices without bleeding                           K76.6, Portal hypertension                           K31.89, Other diseases of stomach and duodenum CPT copyright 2016 American Medical Association. All rights reserved. The codes documented in this report are preliminary and upon coder review may  be revised to meet current compliance requirements. Jonette EvaSandi Rondel Episcopo, MD Jonette EvaSandi Kendra Grissett MD, MD 06/26/2017 10:33:30 AM This report has been signed electronically. Number of Addenda: 0

## 2017-06-26 NOTE — Transfer of Care (Signed)
Immediate Anesthesia Transfer of Care Note  Patient: Christian Sanders  Procedure(s) Performed: ESOPHAGOGASTRODUODENOSCOPY (EGD) WITH PROPOFOL (N/A ) ESOPHAGEAL BANDING (N/A )  Patient Location: PACU  Anesthesia Type:MAC  Level of Consciousness: awake, alert , oriented, drowsy and patient cooperative  Airway & Oxygen Therapy: Patient Spontanous Breathing and Patient connected to face mask oxygen  Post-op Assessment: Report given to RN and Post -op Vital signs reviewed and stable  Post vital signs: Reviewed and stable  Last Vitals:  Vitals Value Taken Time  BP 108/63 06/26/2017 10:27 AM  Temp    Pulse 75 06/26/2017 10:28 AM  Resp 17 06/26/2017 10:28 AM  SpO2 99 % 06/26/2017 10:28 AM  Vitals shown include unvalidated device data.  Last Pain:  Vitals:   06/26/17 0803  TempSrc: Oral         Complications: No apparent anesthesia complications

## 2017-06-27 MED ORDER — LIDOCAINE VISCOUS 2 % MT SOLN: OROMUCOSAL | 5 refills | Status: DC

## 2017-06-27 NOTE — Telephone Encounter (Signed)
Called and informed girl friend.

## 2017-06-27 NOTE — Addendum Note (Signed)
Addended by: West BaliFIELDS, Kandie Keiper L on: 06/27/2017 09:19 AM   Modules accepted: Orders

## 2017-06-27 NOTE — Telephone Encounter (Signed)
PLEASE CALL PT. The Rx was sent today with 5 refills.

## 2017-06-27 NOTE — Telephone Encounter (Signed)
I called and left VM that the lidocaine was not a new prescription, but it was given with an additional refill. It would not have been filled without you requesting it. Please call with any questions.

## 2017-06-29 ENCOUNTER — Encounter (HOSPITAL_COMMUNITY): Payer: Self-pay | Admitting: Gastroenterology

## 2017-07-06 ENCOUNTER — Other Ambulatory Visit (HOSPITAL_COMMUNITY): Payer: Self-pay | Admitting: Psychiatry

## 2017-07-06 DIAGNOSIS — M7552 Bursitis of left shoulder: Secondary | ICD-10-CM | POA: Diagnosis not present

## 2017-07-06 DIAGNOSIS — M7582 Other shoulder lesions, left shoulder: Secondary | ICD-10-CM | POA: Diagnosis not present

## 2017-07-06 DIAGNOSIS — M19012 Primary osteoarthritis, left shoulder: Secondary | ICD-10-CM | POA: Diagnosis not present

## 2017-07-06 MED ORDER — SERTRALINE HCL 50 MG PO TABS: 50.0000 mg | ORAL_TABLET | Freq: Every day | ORAL | 0 refills | Status: DC

## 2017-07-09 ENCOUNTER — Encounter (HOSPITAL_COMMUNITY): Payer: Self-pay | Admitting: Emergency Medicine

## 2017-07-09 ENCOUNTER — Other Ambulatory Visit: Payer: Self-pay

## 2017-07-09 ENCOUNTER — Emergency Department (HOSPITAL_COMMUNITY): Payer: Medicare HMO

## 2017-07-09 ENCOUNTER — Emergency Department (HOSPITAL_COMMUNITY)
Admission: EM | Admit: 2017-07-09 | Discharge: 2017-07-09 | Disposition: A | Discharge status: Home / Self Care | Payer: Medicare HMO | Attending: Emergency Medicine | Admitting: Emergency Medicine

## 2017-07-09 DIAGNOSIS — F17210 Nicotine dependence, cigarettes, uncomplicated: Secondary | ICD-10-CM | POA: Diagnosis not present

## 2017-07-09 DIAGNOSIS — M79662 Pain in left lower leg: Secondary | ICD-10-CM | POA: Diagnosis not present

## 2017-07-09 DIAGNOSIS — R69 Illness, unspecified: Secondary | ICD-10-CM | POA: Diagnosis not present

## 2017-07-09 DIAGNOSIS — I10 Essential (primary) hypertension: Secondary | ICD-10-CM | POA: Insufficient documentation

## 2017-07-09 DIAGNOSIS — Z79899 Other long term (current) drug therapy: Secondary | ICD-10-CM | POA: Diagnosis not present

## 2017-07-09 DIAGNOSIS — R2242 Localized swelling, mass and lump, left lower limb: Secondary | ICD-10-CM | POA: Insufficient documentation

## 2017-07-09 DIAGNOSIS — M79605 Pain in left leg: Secondary | ICD-10-CM | POA: Insufficient documentation

## 2017-07-09 DIAGNOSIS — R52 Pain, unspecified: Secondary | ICD-10-CM

## 2017-07-09 DIAGNOSIS — M7989 Other specified soft tissue disorders: Secondary | ICD-10-CM | POA: Diagnosis not present

## 2017-07-09 MED ORDER — OXYCODONE HCL 5 MG PO TABS
5.0000 mg | ORAL_TABLET | Freq: Once | ORAL | Status: AC
  Administered 2017-07-09: 5 mg via ORAL
  Filled 2017-07-09: qty 1

## 2017-07-09 MED ORDER — OXYCODONE HCL 5 MG PO TABS
ORAL_TABLET | ORAL | Status: AC
  Filled 2017-07-09: qty 1

## 2017-07-09 MED ORDER — OXYCODONE HCL 5 MG PO TABS: 5.0000 mg | ORAL_TABLET | ORAL | 0 refills | Status: DC | PRN

## 2017-07-09 NOTE — ED Notes (Signed)
First oxycodone tablet dropped on the floor. Discarded into sharps bin and overrode another.

## 2017-07-09 NOTE — ED Triage Notes (Signed)
Left leg pain for three days. Denies injury.

## 2017-07-09 NOTE — ED Notes (Signed)
Patient transported to X-ray 

## 2017-07-09 NOTE — ED Notes (Signed)
Pt still in x-ray

## 2017-07-09 NOTE — ED Provider Notes (Signed)
[facility] EMERGENCY DEPARTMENT Provider Note   CSN: [id number] Arrival date & time: 07/09/17  0940     History   Chief Complaint Chief Complaint  Patient presents with  . Leg Pain    HPI Christian Sanders is a 57 y.o. male with past medical history including liver cirrhosis with resultant esophageal varices and portal htn, gerd and htn, presenting with a 3 day history of left calf pain and edema, with the edema actually improved today but the pain persistent per pt and his wife. He denies numbness or weakness in the extremity, also denies injury. Endorses being fairly sedentary given his other medical conditions.  He has no sob, cp, no history of clotting disorder.  He has found no alleviators for his symptoms.  The history is provided by the patient and the spouse.    Past Medical History:  Diagnosis Date  . Abnormal CT of liver 12/20/2016  . Anxiety   . Bipolar disorder (HCC)   . Dentures complicating chewing    full upper, partial lower  . Depression   . Esophageal varices determined by endoscopy (HCC)   . Gastritis and duodenitis   . GERD (gastroesophageal reflux disease)   . Headache    chronic, s/p facial injury and reconstruction  . Hypertension   . Portal hypertensive gastropathy (HCC)   . PTSD (post-traumatic stress disorder)   . Vertebral column disorder    2 crushed thoracic vertebra    Patient Active Problem List   Diagnosis Date Noted  . Mild episode of recurrent major depressive disorder (HCC) 06/13/2017  . Panic disorder 06/13/2017  . Alcohol use disorder, severe, in early remission (HCC) 06/13/2017  . Esophageal varices in alcoholic cirrhosis (HCC)   . Depression 05/08/2017  . Varices of esophagus determined by endoscopy (HCC)   . Gastritis and duodenitis   . Splenomegaly 03/08/2017  . Hepatic cirrhosis (HCC) 12/27/2016  . RUQ pain 12/27/2016  . Portal hypertension (HCC) 12/27/2016  . Elevated liver enzymes 03/02/2016  . BPH without urinary  obstruction 02/28/2016  . ED (erectile dysfunction) 12/28/2015  . SK (seborrheic keratosis) 12/28/2015  . Chronic headaches 02/09/2015  . Tobacco abuse 02/09/2015  . Hypertension 11/23/2014  . Bipolar disorder (HCC) 11/23/2014  . PTSD (post-traumatic stress disorder) 11/23/2014  . Special screening for malignant neoplasms, colon   . Chronic mid back pain 12/16/2013  . Closed compression fracture of thoracic vertebra (HCC) 09/05/2013    Past Surgical History:  Procedure Laterality Date  . BIOPSY  04/17/2017   Procedure: BIOPSY;  Surgeon: West Bali, MD;  Location: [facility];  Service: Endoscopy;;  gastric for h pylori  . COLONOSCOPY WITH PROPOFOL N/A 10/09/2014   Procedure: COLONOSCOPY WITH PROPOFOL;  Surgeon: Midge Minium, MD;  Location: [facility];  Service: Endoscopy;  Laterality: N/A;  . ESOPHAGEAL BANDING N/A 04/17/2017   Procedure: ESOPHAGEAL BANDING;  Surgeon: West Bali, MD;  Location: [facility];  Service: Endoscopy;  Laterality: N/A;  . ESOPHAGEAL BANDING N/A 05/22/2017   Procedure: ESOPHAGEAL BANDING;  Surgeon: West Bali, MD;  Location: [facility];  Service: Endoscopy;  Laterality: N/A;  . ESOPHAGEAL BANDING N/A 06/26/2017   Procedure: ESOPHAGEAL BANDING;  Surgeon: West Bali, MD;  Location: [facility];  Service: Endoscopy;  Laterality: N/A;  . ESOPHAGOGASTRODUODENOSCOPY (EGD) WITH PROPOFOL N/A 04/17/2017   Procedure: ESOPHAGOGASTRODUODENOSCOPY (EGD) WITH PROPOFOL;  Surgeon: West Bali, MD;  Location: [facility];  Service: Endoscopy;  Laterality: N/A;  10:45am  . ESOPHAGOGASTRODUODENOSCOPY (EGD) WITH PROPOFOL N/A 05/22/2017   Procedure: ESOPHAGOGASTRODUODENOSCOPY (EGD) WITH PROPOFOL;  Surgeon: West BaliFields, Sandi L, MD;  Location: [facility];  Service: Endoscopy;  Laterality: N/A;  . ESOPHAGOGASTRODUODENOSCOPY (EGD) WITH PROPOFOL N/A 06/26/2017   Procedure: ESOPHAGOGASTRODUODENOSCOPY (EGD) WITH PROPOFOL;  Surgeon: West BaliFields, Sandi L, MD;   Location: [facility];  Service: Endoscopy;  Laterality: N/A;  1:45pm  . FACIAL RECONSTRUCTION SURGERY     eye socket with 2 metal plates        Home Medications    Prior to Admission medications   Medication Sig Start Date End Date Taking? Authorizing Provider  ALPRAZolam Prudy Feeler(XANAX) 0.5 MG tablet Take 0.5-1 tablets (0.25-0.5 mg total) by mouth 2 (two) times daily as needed for anxiety. 06/26/17  Yes Hisada, Barbee Cougheina, MD  amLODipine (NORVASC) 5 MG tablet Take 1 tablet (5 mg total) by mouth daily. 05/08/17  Yes Karamalegos, Netta NeatAlexander J, DO  fluticasone (FLONASE) 50 MCG/ACT nasal spray Place 2 sprays into both nostrils daily. Use for 4-6 weeks then stop and use seasonally or as needed. 05/08/17  Yes Karamalegos, Netta NeatAlexander J, DO  gabapentin (NEURONTIN) 100 MG capsule Start 1 capsule daily, increase by 1 cap every 2-3 days as tolerated up to 3 times a day, or may take 3 at once in evening. Patient taking differently: Take 100 mg by mouth 2 (two) times daily.  06/05/17  Yes Karamalegos, Netta NeatAlexander J, DO  omeprazole (PRILOSEC) 20 MG capsule Take 20 mg by mouth daily.   Yes [provider]  sildenafil (REVATIO) 20 MG tablet TAKE 1 TO 5 TABLETS BY MOUTH ONCE DAILY AS NEEDED Patient taking differently: Take 20-100 mg by mouth as needed (for ED).  05/08/17  Yes Karamalegos, Netta NeatAlexander J, DO  ciprofloxacin (CIPRO) 500 MG tablet 1 po bid for 7 days Patient not taking: Reported on 07/09/2017 06/26/17   West BaliFields, Sandi L, MD  diclofenac sodium (VOLTAREN) 1 % GEL Apply to left shoulder front and back tid Patient not taking: Reported on 06/11/2017 05/11/17   Ivery QualeBryant, Hobson, PA-C  lidocaine (XYLOCAINE) 2 % solution 2 TSP  PO q4h PRN FOR ABDOMINAL OR CHEST PAIN. NO MORE THAN 8 DOSES A DAY. Patient not taking: Reported on 07/09/2017 06/27/17   West BaliFields, Sandi L, MD  lurasidone (LATUDA) 20 MG TABS tablet Take 1 tablet (20 mg total) by mouth daily. Patient not taking: Reported on 07/09/2017 06/13/17   Neysa HotterHisada, Reina, MD  oxyCODONE  (ROXICODONE) 5 MG immediate release tablet Take 1 tablet (5 mg total) by mouth every 4 (four) hours as needed for severe pain. 07/09/17   Burgess AmorIdol, Deontrae Drinkard, PA-C  prochlorperazine (COMPAZINE) 5 MG tablet Take 1 tablet (5 mg total) by mouth 2 (two) times daily as needed for nausea or vomiting (or headache). Patient not taking: Reported on 07/09/2017 05/02/17   Samuel JesterMcManus, Kathleen, DO  sertraline (ZOLOFT) 50 MG tablet 25 mg at night for one week, then 50 mg daily Patient not taking: Reported on 07/09/2017 06/13/17   Neysa HotterHisada, Reina, MD  sertraline (ZOLOFT) 50 MG tablet Take 1 tablet (50 mg total) by mouth daily. Patient not taking: Reported on 07/09/2017 07/06/17   Neysa HotterHisada, Reina, MD    Family History Family History  Problem Relation Age of Onset  . Leukemia Father   . Colon cancer Neg Hx   . Liver disease Neg Hx     Social History Social History   Tobacco Use  . Smoking status: Current Every Day Smoker    Packs/day: 0.25  Years: 30.00    Pack years: 7.50    Types: Cigarettes  . Smokeless tobacco: Never Used  Substance Use Topics  . Alcohol use: No    Alcohol/week: 3.6 oz    Types: 6 Cans of beer per week    Frequency: Never    Comment: None as of 03/08/17; previously 1-1.5 cases of beer a day in his 50s.  . Drug use: No     Allergies   Bee venom; Ibuprofen; Pineapple; Pollen extract; and Tylenol [acetaminophen]   Review of Systems Review of Systems  Constitutional: Negative for chills and fever.  Respiratory: Negative for shortness of breath.   Cardiovascular: Negative for chest pain.  Musculoskeletal: Positive for arthralgias. Negative for joint swelling and myalgias.  Skin: Negative for color change and wound.  Neurological: Negative for weakness and numbness.  Hematological: Negative.      Physical Exam Updated Vital Signs BP 134/72   Pulse 85   Temp 98 F (36.7 C) (Oral)   Resp 18   Ht 6\' 2"  (1.88 m)   Wt 105.2 kg (232 lb)   SpO2 99%   BMI 29.79 kg/m   Physical Exam    Constitutional: He appears well-developed and well-nourished.  HENT:  Head: Atraumatic.  Neck: Normal range of motion.  Cardiovascular: Normal rate.  Pulses equal bilaterally  Pulmonary/Chest: Effort normal.  Musculoskeletal: He exhibits edema and tenderness.       Left lower leg: He exhibits bony tenderness and swelling. He exhibits no deformity.  Subtle nonpitting edema of left lower including ankle and dorsal foot.  No erythema, no induration, no palpable cords or varicosities.  Dorsalis pedal pulse full, distal sensation normal, less than 2 sec cap refill in toes.  Achilles tendon nontender.  Calf is soft but tender.  Positive homan's. With flexion, he does have some tenderness along the foot flexor tendons.  Neurological: He is alert. He has normal strength. He displays normal reflexes. No sensory deficit.  Skin: Skin is warm and dry.  Psychiatric: He has a normal mood and affect.     ED Treatments / Results  Labs (all labs ordered are listed, but only abnormal results are displayed) Labs Reviewed - No data to display  EKG None  Radiology Dg Tibia/fibula Left  Result Date: 07/09/2017 CLINICAL DATA:  Calf pain, swelling. EXAM: LEFT TIBIA AND FIBULA - 2 VIEW COMPARISON:  Left knee series performed 07/21/2013 FINDINGS: There is no evidence of fracture or other focal bone lesions. Soft tissues are unremarkable. IMPRESSION: Negative. Electronically Signed   By: Charlett Nose M.D.   On: 07/09/2017 12:13   US Venous Img Lower Unilateral Left  Result Date: 07/09/2017 CLINICAL DATA:  Left lower calf pain and swelling EXAM: LEFT LOWER EXTREMITY VENOUS DOPPLER ULTRASOUND TECHNIQUE: Gray-scale sonography with graded compression, as well as color Doppler and duplex ultrasound were performed to evaluate the lower extremity deep venous systems from the level of the common femoral vein and including the common femoral, femoral, profunda femoral, popliteal and calf veins including the posterior  tibial, peroneal and gastrocnemius veins when visible. The superficial great saphenous vein was also interrogated. Spectral Doppler was utilized to evaluate flow at rest and with distal augmentation maneuvers in the common femoral, femoral and popliteal veins. COMPARISON:  None. FINDINGS: Contralateral Common Femoral Vein: Respiratory phasicity is normal and symmetric with the symptomatic side. No evidence of thrombus. Normal compressibility. Common Femoral Vein: No evidence of thrombus. Normal compressibility, respiratory phasicity and response to augmentation. Saphenofemoral  Junction: No evidence of thrombus. Normal compressibility and flow on color Doppler imaging. Profunda Femoral Vein: No evidence of thrombus. Normal compressibility and flow on color Doppler imaging. Femoral Vein: No evidence of thrombus. Normal compressibility, respiratory phasicity and response to augmentation. Popliteal Vein: No evidence of thrombus. Normal compressibility, respiratory phasicity and response to augmentation. Calf Veins: No evidence of thrombus. Normal compressibility and flow on color Doppler imaging. Superficial Great Saphenous Vein: No evidence of thrombus. Normal compressibility. Venous Reflux:  None. Other Findings:  Minor ankle subcutaneous edema noted. IMPRESSION: No evidence of deep venous thrombosis. Electronically Signed   By: Judie Petit.  Shick M.D.   On: 07/09/2017 11:57    Procedures Procedures (including critical care time)  Medications Ordered in ED Medications  oxyCODONE (Oxy IR/ROXICODONE) immediate release tablet 5 mg (5 mg Oral Not Given 07/09/17 1214)     Initial Impression / Assessment and Plan / ED Course  I have reviewed the triage vital signs and the nursing notes.  Pertinent labs & imaging results that were available during my care of the patient were reviewed by me and considered in my medical decision making (see chart for details).     Imaging reviewed, no dvt per [place], plain films negative  for stress fx.  Exam somewhat suggests possible tendonitis of the foot flexors, although this does not completely explain the lower leg edema.  He was advised continued elevation, try warm compresses, few oxycodone prescribed for pain relief as pt nsaids and tylenol contraindicated. Plan f/u with ortho and/or pcp for further eval if sx persist (currently sees Dr. Romeo Apple for shoulder issue).  Final Clinical Impressions(s) / ED Diagnoses   Final diagnoses:  Pain of left lower extremity    ED Discharge Orders        Ordered    oxyCODONE (ROXICODONE) 5 MG immediate release tablet  Every 4 hours PRN     07/09/17 1249       Burgess Amor, PA-C 07/10/17 0834    Samuel Jester, DO 07/13/17 [phone]

## 2017-07-09 NOTE — Discharge Instructions (Addendum)
As discussed your ultrasound today is negative for a blood clot in your leg and your x-ray is negative for any obvious bony source for your pain.  Your exam does suggest a possible ankle sprain versus a tendinitis of your anterior foot flexors.  Use ice as discussed several times daily to help with swelling since you are unable to take anti-inflammatories.  I also recommend a heating pad 20 minutes several times daily between episodes of icing, elevation as much as possible.  Use the medication prescribed if needed for pain.  You have also been placed in an ankle support which may help to heal this quicker.  Follow-up with your orthopedist for recheck if your symptoms persist or worsen.

## 2017-07-11 ENCOUNTER — Other Ambulatory Visit: Payer: Self-pay | Admitting: Nurse Practitioner

## 2017-07-12 NOTE — Telephone Encounter (Signed)
Not sure why we have a refill request for vaccinations? Need to clarify this.

## 2017-07-13 ENCOUNTER — Ambulatory Visit (INDEPENDENT_AMBULATORY_CARE_PROVIDER_SITE_OTHER): Payer: Medicare HMO | Admitting: Orthopedic Surgery

## 2017-07-13 VITALS — BP 119/77 | HR 88 | Ht 74.0 in | Wt 233.0 lb

## 2017-07-13 DIAGNOSIS — M67912 Unspecified disorder of synovium and tendon, left shoulder: Secondary | ICD-10-CM | POA: Diagnosis not present

## 2017-07-13 DIAGNOSIS — M25512 Pain in left shoulder: Secondary | ICD-10-CM

## 2017-07-13 DIAGNOSIS — G8929 Other chronic pain: Secondary | ICD-10-CM | POA: Diagnosis not present

## 2017-07-13 MED ORDER — OXYCODONE HCL 5 MG PO TABS: 5.0000 mg | ORAL_TABLET | Freq: Three times a day (TID) | ORAL | 0 refills | Status: DC | PRN

## 2017-07-13 NOTE — Telephone Encounter (Signed)
I called and spoke to [place] at the pharmacy, he said injections was given yesterday. York SpanielSaid they must have hit a wrong button, and he apologized.

## 2017-07-13 NOTE — Patient Instructions (Signed)
See Dr Kirtland BouchardK in 2 months

## 2017-07-13 NOTE — Progress Notes (Signed)
FOLLOW UP VISIT : MRI RESULTS   Chief Complaint  Patient presents with  . Follow-up    Recheck on left shoulder, MRI results.     HPI: The patient is here TO DISCUSS THE RESULTS OF MRI  HPI  57 year old male completed occupational therapy for pain in his left shoulder he did not improve to my satisfaction and I sent him for MRI his MRI came back no evidence of rotator cuff tear just bursitis and tendinitis/tendinosis.  He has liver disease he cannot take anti-inflammatories  Still complains of pain with forward elevation  Review of systems no abdominal swelling no numbness or tingling in the arm at this time  BP 119/77   Pulse 88   Ht 6\' 2"  (1.88 m)   Wt 233 lb (105.7 kg)   BMI 29.92 kg/m     Medical decision-making section   DATA  MRI REPORT:  FINDINGS: Bones: No evidence of fracture, marrow edema, or osteonecrosis. Edema identified within the greater tuberosity with mild subchondral cystic changes. Soft tissues: Subacromial/subdeltoid fluid. Glenohumeral joint: No malalignment. No effusion. No significant degenerative changes. Acromioclavicular joint: No malalignment. No effusion. Minimal AC joint degenerative changes.  Supraspinatus tendon: Intact. Ill-defined increased T2 signal with thickening. Infraspinatus tendon: Intact. Subscapularis tendon: Intact. Ill-defined increased T2 signal. Teres minor tendon: Intact Long head of biceps tendon: Intact In anatomic position. Labrum: Limited evaluation without intra-articular contrast. No gross tear identified.  Additional findings: None    IMPRESSION: Moderate supraspinatus tendinosis.  Mild subscapularis tendinosis.  Minimal AC joint degenerative changes.  Subacromial/subdeltoid bursitis.  Electronically Signed by: Lourena SimmondsSean Ploof   MY READING: MRI OF THE T1-T2 sequencing multiple views of the left shoulder.  I do see some areas of concern for cuff tearing but I tend to agree there is no obvious rotator cuff  tear   Encounter Diagnoses  Name Primary?  . Chronic left shoulder pain Yes  . Rotator cuff dysfunction, left      PLAN:   Recommend continued home exercises.  I refilled his oxycodone, he is been on that a long time  He will see Dr. Hilda LiasKeeling in a couple months as and then as needed probably

## 2017-07-16 ENCOUNTER — Telehealth: Payer: Self-pay | Admitting: Family Medicine

## 2017-07-16 NOTE — Telephone Encounter (Signed)
Left message regarding need to schedule MWV w [facility] and physical f/u

## 2017-07-25 ENCOUNTER — Ambulatory Visit (HOSPITAL_COMMUNITY): Payer: Self-pay | Admitting: Psychiatry

## 2017-07-25 DIAGNOSIS — K7469 Other cirrhosis of liver: Secondary | ICD-10-CM | POA: Diagnosis not present

## 2017-07-25 DIAGNOSIS — R69 Illness, unspecified: Secondary | ICD-10-CM | POA: Diagnosis not present

## 2017-07-25 DIAGNOSIS — B182 Chronic viral hepatitis C: Secondary | ICD-10-CM | POA: Diagnosis not present

## 2017-07-31 ENCOUNTER — Other Ambulatory Visit: Payer: Self-pay | Admitting: Family Medicine

## 2017-07-31 DIAGNOSIS — J3089 Other allergic rhinitis: Secondary | ICD-10-CM
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
## 2017-08-09 ENCOUNTER — Encounter (HOSPITAL_COMMUNITY): Payer: Self-pay | Admitting: Psychiatry

## 2017-08-09 ENCOUNTER — Ambulatory Visit (INDEPENDENT_AMBULATORY_CARE_PROVIDER_SITE_OTHER): Payer: Medicare HMO | Admitting: Psychiatry

## 2017-08-09 VITALS — BP 145/88 | HR 79 | Ht 74.0 in | Wt 232.0 lb

## 2017-08-09 DIAGNOSIS — F4310 Post-traumatic stress disorder, unspecified: Secondary | ICD-10-CM

## 2017-08-09 DIAGNOSIS — F17210 Nicotine dependence, cigarettes, uncomplicated: Secondary | ICD-10-CM

## 2017-08-09 DIAGNOSIS — K7030 Alcoholic cirrhosis of liver without ascites: Secondary | ICD-10-CM

## 2017-08-09 DIAGNOSIS — K766 Portal hypertension: Secondary | ICD-10-CM

## 2017-08-09 DIAGNOSIS — Z79899 Other long term (current) drug therapy: Secondary | ICD-10-CM

## 2017-08-09 DIAGNOSIS — F410 Panic disorder [episodic paroxysmal anxiety] without agoraphobia: Secondary | ICD-10-CM | POA: Diagnosis not present

## 2017-08-09 DIAGNOSIS — Z62819 Personal history of unspecified abuse in childhood: Secondary | ICD-10-CM

## 2017-08-09 DIAGNOSIS — K219 Gastro-esophageal reflux disease without esophagitis: Secondary | ICD-10-CM | POA: Diagnosis not present

## 2017-08-09 DIAGNOSIS — F330 Major depressive disorder, recurrent, mild: Secondary | ICD-10-CM | POA: Diagnosis not present

## 2017-08-09 DIAGNOSIS — R69 Illness, unspecified: Secondary | ICD-10-CM | POA: Diagnosis not present

## 2017-08-09 MED ORDER — ALPRAZOLAM 0.5 MG PO TABS: 0.2500 mg | ORAL_TABLET | Freq: Two times a day (BID) | ORAL | 1 refills | Status: DC | PRN

## 2017-08-09 NOTE — Patient Instructions (Addendum)
1. Increase sertraline 50 mg daily  2. Continue latuda 20 mg daily  3. Continue Xanax 0.5 mg twice a day as needed for anxiety 4. Return to clinic in two months for 30 mins 5. Obtain record from his prior psychiatrist, Annamarie Major, NP

## 2017-08-13 DIAGNOSIS — B182 Chronic viral hepatitis C: Secondary | ICD-10-CM | POA: Diagnosis not present

## 2017-08-16 ENCOUNTER — Encounter: Payer: Self-pay | Admitting: Orthopaedic Surgery

## 2017-08-16 ENCOUNTER — Ambulatory Visit (INDEPENDENT_AMBULATORY_CARE_PROVIDER_SITE_OTHER): Payer: Medicare HMO | Admitting: Orthopaedic Surgery

## 2017-08-16 VITALS — BP 138/76 | HR 85 | Ht 74.0 in | Wt 233.0 lb

## 2017-08-16 DIAGNOSIS — M25512 Pain in left shoulder: Secondary | ICD-10-CM | POA: Diagnosis not present

## 2017-08-16 DIAGNOSIS — G8929 Other chronic pain: Secondary | ICD-10-CM

## 2017-08-16 DIAGNOSIS — M67912 Unspecified disorder of synovium and tendon, left shoulder: Secondary | ICD-10-CM | POA: Diagnosis not present

## 2017-08-16 MED ORDER — OXYCODONE HCL 5 MG PO TABS: ORAL_TABLET | ORAL | 0 refills | Status: DC

## 2017-08-16 NOTE — Patient Instructions (Signed)
Steps to Quit Smoking Smoking tobacco can be bad for your health. It can also affect almost every organ in your body. Smoking puts you and people around you at risk for many serious long-lasting (chronic) diseases. Quitting smoking is hard, but it is one of the best things that you can do for your health. It is never too late to quit. What are the benefits of quitting smoking? When you quit smoking, you lower your risk for getting serious diseases and conditions. They can include:  Lung cancer or lung disease.  Heart disease.  Stroke.  Heart attack.  Not being able to have children (infertility).  Weak bones (osteoporosis) and broken bones (fractures).  If you have coughing, wheezing, and shortness of breath, those symptoms may get better when you quit. You may also get sick less often. If you are pregnant, quitting smoking can help to lower your chances of having a baby of low birth weight. What can I do to help me quit smoking? Talk with your doctor about what can help you quit smoking. Some things you can do (strategies) include:  Quitting smoking totally, instead of slowly cutting back how much you smoke over a period of time.  Going to in-person counseling. You are more likely to quit if you go to many counseling sessions.  Using resources and support systems, such as: ? Online chats with a counselor. ? Phone quitlines. ? Printed self-help materials. ? Support groups or group counseling. ? Text messaging programs. ? Mobile phone apps or applications.  Taking medicines. Some of these medicines may have nicotine in them. If you are pregnant or breastfeeding, do not take any medicines to quit smoking unless your doctor says it is okay. Talk with your doctor about counseling or other things that can help you.  Talk with your doctor about using more than one strategy at the same time, such as taking medicines while you are also going to in-person counseling. This can help make  quitting easier. What things can I do to make it easier to quit? Quitting smoking might feel very hard at first, but there is a lot that you can do to make it easier. Take these steps:  Talk to your family and friends. Ask them to support and encourage you.  Call phone quitlines, reach out to support groups, or work with a counselor.  Ask people who smoke to not smoke around you.  Avoid places that make you want (trigger) to smoke, such as: ? Bars. ? Parties. ? Smoke-break areas at work.  Spend time with people who do not smoke.  Lower the stress in your life. Stress can make you want to smoke. Try these things to help your stress: ? Getting regular exercise. ? Deep-breathing exercises. ? Yoga. ? Meditating. ? Doing a body scan. To do this, close your eyes, focus on one area of your body at a time from head to toe, and notice which parts of your body are tense. Try to relax the muscles in those areas.  Download or buy apps on your mobile phone or tablet that can help you stick to your quit plan. There are many free apps, such as QuitGuide from the CDC (Centers for Disease Control and Prevention). You can find more support from smokefree.gov and other websites.  This information is not intended to replace advice given to you by your health care provider. Make sure you discuss any questions you have with your health care provider. Document Released: 01/14/2009 Document   Revised: 11/16/2015 Document Reviewed: 08/04/2014 Elsevier Interactive Patient Education  2018 Elsevier Inc.  

## 2017-08-16 NOTE — Progress Notes (Signed)
Patient Christian Sanders, male DOB:1960/08/09, 57 y.o. FAO:[id number]  Chief Complaint  Patient presents with  . Shoulder Pain    left    HPI  Christian Sanders is a 57 y.o. male who has chronic left shoulder pani.  He had negative MRI for tear recently.  He saw Dr. Romeo Apple in my absence.  He continues to have pain, more in the evenings.  He has no numbness, no trauma.  He is doing his exercises. HPI  Body mass index is 29.92 kg/m.  ROS  Review of Systems  Musculoskeletal: Positive for arthralgias.  All other systems reviewed and are negative.   Past Medical History:  Diagnosis Date  . Abnormal CT of liver 12/20/2016  . Anxiety   . Bipolar disorder ([facility])   . Dentures complicating chewing    full upper, partial lower  . Depression   . Esophageal varices determined by endoscopy ([facility])   . Gastritis and duodenitis   . GERD (gastroesophageal reflux disease)   . Headache    chronic, s/p facial injury and reconstruction  . Hypertension   . Portal hypertensive gastropathy ([facility])   . PTSD (post-traumatic stress disorder)   . Vertebral column disorder    2 crushed thoracic vertebra    Past Surgical History:  Procedure Laterality Date  . BIOPSY  04/17/2017   Procedure: BIOPSY;  Surgeon: West Bali, MD;  [facility];  Service: Endoscopy;;  gastric for h pylori  . COLONOSCOPY WITH PROPOFOL N/A 10/09/2014   Procedure: COLONOSCOPY WITH PROPOFOL;  Surgeon: Midge Minium, MD;  Location: [facility];  Service: Endoscopy;  Laterality: N/A;  . ESOPHAGEAL BANDING N/A 04/17/2017   Procedure: ESOPHAGEAL BANDING;  Surgeon: West Bali, MD;  [facility];  Service: Endoscopy;  Laterality: N/A;  . ESOPHAGEAL BANDING N/A 05/22/2017   Procedure: ESOPHAGEAL BANDING;  Surgeon: West Bali, MD;  [facility];  Service: Endoscopy;  Laterality: N/A;  . ESOPHAGEAL BANDING N/A 06/26/2017   Procedure: ESOPHAGEAL BANDING;  Surgeon: West Bali, MD;   [facility];  Service: Endoscopy;  Laterality: N/A;  . ESOPHAGOGASTRODUODENOSCOPY (EGD) WITH PROPOFOL N/A 04/17/2017   Procedure: ESOPHAGOGASTRODUODENOSCOPY (EGD) WITH PROPOFOL;  Surgeon: West Bali, MD;  [facility];  Service: Endoscopy;  Laterality: N/A;  10:45am  . ESOPHAGOGASTRODUODENOSCOPY (EGD) WITH PROPOFOL N/A 05/22/2017   Procedure: ESOPHAGOGASTRODUODENOSCOPY (EGD) WITH PROPOFOL;  Surgeon: West Bali, MD;  [facility];  Service: Endoscopy;  Laterality: N/A;  . ESOPHAGOGASTRODUODENOSCOPY (EGD) WITH PROPOFOL N/A 06/26/2017   Procedure: ESOPHAGOGASTRODUODENOSCOPY (EGD) WITH PROPOFOL;  Surgeon: West Bali, MD;  [facility];  Service: Endoscopy;  Laterality: N/A;  1:45pm  . FACIAL RECONSTRUCTION SURGERY     eye socket with 2 metal plates    Family History  Problem Relation Age of Onset  . Leukemia Father   . Colon cancer Neg Hx   . Liver disease Neg Hx     Social History Social History   Tobacco Use  . Smoking status: Current Every Day Smoker    Packs/day: 0.25    Years: 30.00    Pack years: 7.50    Types: Cigarettes  . Smokeless tobacco: Never Used  Substance Use Topics  . Alcohol use: No    Alcohol/week: 3.6 oz    Types: 6 Cans of beer per week    Frequency: Never    Comment: None as of 03/08/17; previously 1-1.5 cases of beer a day  in his 7s.  . Drug use: No    Allergies  Allergen Reactions  . Bee Venom Swelling and Other (See Comments)    Throat and Tongue   . Ibuprofen Other (See Comments)    Due to liver  . Pineapple Nausea And Vomiting  . Pollen Extract Other (See Comments)    Runny nose, itchy, watery eyes, congestion  . Tylenol [Acetaminophen] Other (See Comments)    Due to liver    Current Outpatient Medications  Medication Sig Dispense Refill  . [START ON 08/23/2017] ALPRAZolam (XANAX) 0.5 MG tablet Take 0.5-1 tablets (0.25-0.5 mg total) by mouth 2 (two) times daily as needed for anxiety. 60  tablet 1  . amLODipine (NORVASC) 5 MG tablet Take 1 tablet (5 mg total) by mouth daily. 30 tablet 5  . diclofenac sodium (VOLTAREN) 1 % GEL Apply to left shoulder front and back tid 100 g 1  . EPCLUSA 400-100 MG TABS   2  . fluticasone (FLONASE) 50 MCG/ACT nasal spray PLACE 2 SPRAYS INTO BOTH NOSTRILS DAILY. USE FOR 4-6 WKS THEN STOP AND USE SEASONALLY OR AS NEEDED. 16 g 2  . gabapentin (NEURONTIN) 100 MG capsule Start 1 capsule daily, increase by 1 cap every 2-3 days as tolerated up to 3 times a day, or may take 3 at once in evening. (Patient taking differently: Take 100 mg by mouth 2 (two) times daily. ) 270 capsule 1  . lidocaine (XYLOCAINE) 2 % solution 2 TSP  PO q4h PRN FOR ABDOMINAL OR CHEST PAIN. NO MORE THAN 8 DOSES A DAY. 300 mL 5  . lurasidone (LATUDA) 20 MG TABS tablet Take 1 tablet (20 mg total) by mouth daily. 30 tablet 1  . omeprazole (PRILOSEC) 20 MG capsule Take 20 mg by mouth daily.    . ondansetron (ZOFRAN-ODT) 4 MG disintegrating tablet     . oxyCODONE (ROXICODONE) 5 MG immediate release tablet One tablet every six hours as needed for pain. 30 tablet 0  . prochlorperazine (COMPAZINE) 5 MG tablet Take 1 tablet (5 mg total) by mouth 2 (two) times daily as needed for nausea or vomiting (or headache). 10 tablet 0  . ribavirin (COPEGUS) 200 MG tablet   2  . sertraline (ZOLOFT) 50 MG tablet Take 1 tablet (50 mg total) by mouth daily. 90 tablet 0  . sildenafil (REVATIO) 20 MG tablet TAKE 1 TO 5 TABLETS BY MOUTH ONCE DAILY AS NEEDED (Patient taking differently: Take 20-100 mg by mouth as needed (for ED). ) 50 tablet 5   No current facility-administered medications for this visit.      Physical Exam  Blood pressure 138/76, pulse 85, height  (1.88 m), weight 233 lb (105.7 kg).  Constitutional: overall normal hygiene, normal nutrition, well developed, normal grooming, normal body habitus. Assistive device:none  Musculoskeletal: gait and station Limp none, muscle tone and  strength are normal, no tremors or atrophy is present.  .  Neurological: coordination overall normal.  Deep tendon reflex/nerve stretch intact.  Sensation normal.  Cranial nerves II-XII intact.   Skin:   Normal overall no scars, lesions, ulcers or rashes. No psoriasis.  Psychiatric: Alert and oriented x 3.  Recent memory intact, remote memory unclear.  Normal mood and affect. Well groomed.  Good eye contact.  Cardiovascular: overall no swelling, no varicosities, no edema bilaterally, normal temperatures of the legs and arms, no clubbing, cyanosis and good capillary refill.  Lymphatic: palpation is normal.  Examination of left Upper Extremity is done.  Inspection:   Overall:  Elbow non-tender without crepitus or defects, forearm non-tender without crepitus or defects, wrist non-tender without crepitus or defects, hand non-tender.    Shoulder: with glenohumeral joint tenderness, without effusion.   Upper arm: without swelling and tenderness   Range of motion:   Overall:  Full range of motion of the elbow, full range of motion of wrist and full range of motion in fingers.   Shoulder:  left  165 degrees forward flexion; 150 degrees abduction; 35 degrees internal rotation, 35 degrees external rotation, 15 degrees extension, 40 degrees adduction.   Stability:   Overall:  Shoulder, elbow and wrist stable   Strength and Tone:   Overall full shoulder muscles strength, full upper arm strength and normal upper arm bulk and tone.  All other systems reviewed and are negative   The patient has been educated about the nature of the problem(s) and counseled on treatment options.  The patient appeared to understand what I have discussed and is in agreement with it.  Encounter Diagnoses  Name Primary?  . Chronic left shoulder pain Yes  . Rotator cuff dysfunction, left     PLAN Call if any problems.  Precautions discussed.  Continue current medications.   Return to clinic 6 weeks   I have  reviewed the [facility] Controlled Substance Reporting System web site prior to prescribing narcotic medicine for this patient.  Electronically Signed Darreld Mclean, MD 5/16/20199:30 AM

## 2017-08-21 ENCOUNTER — Emergency Department (HOSPITAL_COMMUNITY)
Admission: EM | Admit: 2017-08-21 | Discharge: 2017-08-21 | Disposition: A | Discharge status: Home / Self Care | Payer: Medicare HMO | Attending: Emergency Medicine | Admitting: Emergency Medicine

## 2017-08-21 ENCOUNTER — Emergency Department (HOSPITAL_COMMUNITY): Payer: Medicare HMO

## 2017-08-21 ENCOUNTER — Other Ambulatory Visit: Payer: Self-pay

## 2017-08-21 ENCOUNTER — Encounter (HOSPITAL_COMMUNITY): Payer: Self-pay | Admitting: Emergency Medicine

## 2017-08-21 DIAGNOSIS — R69 Illness, unspecified: Secondary | ICD-10-CM | POA: Diagnosis not present

## 2017-08-21 DIAGNOSIS — E876 Hypokalemia: Secondary | ICD-10-CM | POA: Insufficient documentation

## 2017-08-21 DIAGNOSIS — R600 Localized edema: Secondary | ICD-10-CM

## 2017-08-21 DIAGNOSIS — Z79899 Other long term (current) drug therapy: Secondary | ICD-10-CM | POA: Diagnosis not present

## 2017-08-21 DIAGNOSIS — R0989 Other specified symptoms and signs involving the circulatory and respiratory systems: Secondary | ICD-10-CM | POA: Diagnosis not present

## 2017-08-21 DIAGNOSIS — R2243 Localized swelling, mass and lump, lower limb, bilateral: Secondary | ICD-10-CM | POA: Insufficient documentation

## 2017-08-21 DIAGNOSIS — I10 Essential (primary) hypertension: Secondary | ICD-10-CM | POA: Diagnosis not present

## 2017-08-21 DIAGNOSIS — F17210 Nicotine dependence, cigarettes, uncomplicated: Secondary | ICD-10-CM | POA: Diagnosis not present

## 2017-08-21 LAB — COMPREHENSIVE METABOLIC PANEL
ALBUMIN: 3.2 g/dL — AB (ref 3.5–5.0)
ALK PHOS: 136 U/L — AB (ref 38–126)
ALT: 29 U/L (ref 17–63)
AST: 47 U/L — AB (ref 15–41)
Anion gap: 7 (ref 5–15)
BILIRUBIN TOTAL: 4 mg/dL — AB (ref 0.3–1.2)
CALCIUM: 8.9 mg/dL (ref 8.9–10.3)
CO2: 23 mmol/L (ref 22–32)
Chloride: 111 mmol/L (ref 101–111)
Creatinine, Ser: 0.73 mg/dL (ref 0.61–1.24)
GFR calc Af Amer: >60 mL/min (ref >60)
GFR calc non Af Amer: >60 mL/min (ref >60)
GLUCOSE: 154 mg/dL — AB (ref 65–99)
POTASSIUM: 3.1 mmol/L — AB (ref 3.5–5.1)
Sodium: 141 mmol/L (ref 135–145)
TOTAL PROTEIN: 7.4 g/dL (ref 6.5–8.1)

## 2017-08-21 LAB — CBC
HCT: 32.6 % — ABNORMAL LOW (ref 39.0–52.0)
HEMOGLOBIN: 11.3 g/dL — AB (ref 13.0–17.0)
MCH: 36.3 pg — ABNORMAL HIGH (ref 26.0–34.0)
MCHC: 34.7 g/dL (ref 30.0–36.0)
MCV: 104.8 fL — ABNORMAL HIGH (ref 78.0–100.0)
Platelets: 117 10*3/uL — ABNORMAL LOW (ref 150–400)
RBC: 3.11 MIL/uL — ABNORMAL LOW (ref 4.22–5.81)
RDW: 15.1 % (ref 11.5–15.5)
WBC: 5.8 10*3/uL (ref 4.0–10.5)

## 2017-08-21 MED ORDER — POTASSIUM CHLORIDE CRYS ER 20 MEQ PO TBCR: 40.0000 meq | EXTENDED_RELEASE_TABLET | Freq: Once | ORAL | Status: DC

## 2017-08-21 MED ORDER — POTASSIUM CHLORIDE CRYS ER 20 MEQ PO TBCR: 20.0000 meq | EXTENDED_RELEASE_TABLET | Freq: Two times a day (BID) | ORAL | 0 refills | Status: DC

## 2017-08-21 MED ORDER — FUROSEMIDE 40 MG PO TABS
40.0000 mg | ORAL_TABLET | Freq: Once | ORAL | Status: AC
  Administered 2017-08-21: 40 mg via ORAL
  Filled 2017-08-21: qty 1

## 2017-08-21 MED ORDER — FUROSEMIDE 40 MG PO TABS: 40.0000 mg | ORAL_TABLET | Freq: Every day | ORAL | 0 refills | Status: DC

## 2017-08-21 NOTE — ED Provider Notes (Addendum)
[facility] EMERGENCY DEPARTMENT Provider Note   CSN: [id number] Arrival date & time: 08/21/17  4098     History   Chief Complaint Chief Complaint  Patient presents with  . Leg Swelling    HPI Atsushi Yom is a 57 y.o. male.  HPI 57 year old man history of liver failure being treated presents with some increased pain and swelling of bilateral feet.  He reports he has noted some increased swelling over several days.  Reports it being in both feet.  He denies any chest pain, dyspnea, or increased abdominal girth.  Reports taking all medications as prescribed. Past Medical History:  Diagnosis Date  . Abnormal CT of liver 12/20/2016  . Anxiety   . Bipolar disorder (HCC)   . Dentures complicating chewing    full upper, partial lower  . Depression   . Esophageal varices determined by endoscopy (HCC)   . Gastritis and duodenitis   . GERD (gastroesophageal reflux disease)   . Headache    chronic, s/p facial injury and reconstruction  . Hypertension   . Portal hypertensive gastropathy (HCC)   . PTSD (post-traumatic stress disorder)   . Vertebral column disorder    2 crushed thoracic vertebra    Patient Active Problem List   Diagnosis Date Noted  . Mild episode of recurrent major depressive disorder (HCC) 06/13/2017  . Panic disorder 06/13/2017  . Alcohol use disorder, severe, in early remission (HCC) 06/13/2017  . Esophageal varices in alcoholic cirrhosis (HCC)   . Depression 05/08/2017  . Varices of esophagus determined by endoscopy (HCC)   . Gastritis and duodenitis   . Splenomegaly 03/08/2017  . Hepatic cirrhosis (HCC) 12/27/2016  . RUQ pain 12/27/2016  . Portal hypertension (HCC) 12/27/2016  . Elevated liver enzymes 03/02/2016  . BPH without urinary obstruction 02/28/2016  . ED (erectile dysfunction) 12/28/2015  . SK (seborrheic keratosis) 12/28/2015  . Chronic headaches 02/09/2015  . Tobacco abuse 02/09/2015  . Hypertension 11/23/2014  . Bipolar disorder (HCC)  11/23/2014  . PTSD (post-traumatic stress disorder) 11/23/2014  . Special screening for malignant neoplasms, colon   . Chronic mid back pain 12/16/2013  . Closed compression fracture of thoracic vertebra (HCC) 09/05/2013    Past Surgical History:  Procedure Laterality Date  . BIOPSY  04/17/2017   Procedure: BIOPSY;  Surgeon: West Bali, MD;  [facility];  Service: Endoscopy;;  gastric for h pylori  . COLONOSCOPY WITH PROPOFOL N/A 10/09/2014   Procedure: COLONOSCOPY WITH PROPOFOL;  Surgeon: Midge Minium, MD;  Location: [facility];  Service: Endoscopy;  Laterality: N/A;  . ESOPHAGEAL BANDING N/A 04/17/2017   Procedure: ESOPHAGEAL BANDING;  Surgeon: West Bali, MD;  [facility];  Service: Endoscopy;  Laterality: N/A;  . ESOPHAGEAL BANDING N/A 05/22/2017   Procedure: ESOPHAGEAL BANDING;  Surgeon: West Bali, MD;  [facility];  Service: Endoscopy;  Laterality: N/A;  . ESOPHAGEAL BANDING N/A 06/26/2017   Procedure: ESOPHAGEAL BANDING;  Surgeon: West Bali, MD;  [facility];  Service: Endoscopy;  Laterality: N/A;  . ESOPHAGOGASTRODUODENOSCOPY (EGD) WITH PROPOFOL N/A 04/17/2017   Procedure: ESOPHAGOGASTRODUODENOSCOPY (EGD) WITH PROPOFOL;  Surgeon: West Bali, MD;  [facility];  Service: Endoscopy;  Laterality: N/A;  10:45am  . ESOPHAGOGASTRODUODENOSCOPY (EGD) WITH PROPOFOL N/A 05/22/2017   Procedure: ESOPHAGOGASTRODUODENOSCOPY (EGD) WITH PROPOFOL;  Surgeon: West Bali, MD;  [facility];  Service: Endoscopy;  Laterality: N/A;  . ESOPHAGOGASTRODUODENOSCOPY (EGD) WITH PROPOFOL N/A 06/26/2017  Procedure: ESOPHAGOGASTRODUODENOSCOPY (EGD) WITH PROPOFOL;  Surgeon: West Bali, MD;  [facility];  Service: Endoscopy;  Laterality: N/A;  1:45pm  . FACIAL RECONSTRUCTION SURGERY     eye socket with 2 metal plates        Home Medications    Prior to Admission medications   Medication Sig  Start Date End Date Taking? Authorizing Provider  ALPRAZolam Prudy Feeler) 0.5 MG tablet Take 0.5-1 tablets (0.25-0.5 mg total) by mouth 2 (two) times daily as needed for anxiety. 08/23/17   Neysa Hotter, MD  amLODipine (NORVASC) 5 MG tablet Take 1 tablet (5 mg total) by mouth daily. 05/08/17   Karamalegos, Netta Neat, DO  diclofenac sodium (VOLTAREN) 1 % GEL Apply to left shoulder front and back tid 05/11/17   Ivery Quale, PA-C  EPCLUSA 400-100 MG TABS  07/27/17   [provider]  fluticasone (FLONASE) 50 MCG/ACT nasal spray PLACE 2 SPRAYS INTO BOTH NOSTRILS DAILY. USE FOR 4-6 WKS THEN STOP AND USE SEASONALLY OR AS NEEDED. 07/31/17   Althea Charon, Netta Neat, DO  gabapentin (NEURONTIN) 100 MG capsule Start 1 capsule daily, increase by 1 cap every 2-3 days as tolerated up to 3 times a day, or may take 3 at once in evening. Patient taking differently: Take 100 mg by mouth 2 (two) times daily.  06/05/17   Karamalegos, Alexander J, DO  lidocaine (XYLOCAINE) 2 % solution 2 TSP  PO q4h PRN FOR ABDOMINAL OR CHEST PAIN. NO MORE THAN 8 DOSES A DAY. 06/27/17   Fields, Sandi L, MD  lurasidone (LATUDA) 20 MG TABS tablet Take 1 tablet (20 mg total) by mouth daily. 06/13/17   Neysa Hotter, MD  omeprazole (PRILOSEC) 20 MG capsule Take 20 mg by mouth daily.    [provider]  ondansetron (ZOFRAN-ODT) 4 MG disintegrating tablet  08/14/17   [provider]  oxyCODONE (ROXICODONE) 5 MG immediate release tablet One tablet every six hours as needed for pain. 08/16/17   Darreld Mclean, MD  prochlorperazine (COMPAZINE) 5 MG tablet Take 1 tablet (5 mg total) by mouth 2 (two) times daily as needed for nausea or vomiting (or headache). 05/02/17   Samuel Jester, DO  ribavirin (COPEGUS) 200 MG tablet  07/27/17   [provider]  sertraline (ZOLOFT) 50 MG tablet Take 1 tablet (50 mg total) by mouth daily. 07/06/17   Neysa Hotter, MD  sildenafil (REVATIO) 20 MG tablet TAKE 1 TO 5 TABLETS BY MOUTH ONCE  DAILY AS NEEDED Patient taking differently: Take 20-100 mg by mouth as needed (for ED).  05/08/17   Smitty Cords, DO    Family History Family History  Problem Relation Age of Onset  . Leukemia Father   . Colon cancer Neg Hx   . Liver disease Neg Hx     Social History Social History   Tobacco Use  . Smoking status: Current Every Day Smoker    Packs/day: 0.25    Years: 30.00    Pack years: 7.50    Types: Cigarettes  . Smokeless tobacco: Never Used  Substance Use Topics  . Alcohol use: No    Alcohol/week: 3.6 oz    Types: 6 Cans of beer per week    Frequency: Never    Comment: None as of 03/08/17; previously 1-1.5 cases of beer a day in his 68s.  . Drug use: No     Allergies   Bee venom; Ibuprofen; Pineapple; Pollen extract; and Tylenol [acetaminophen]   Review of Systems  Review of Systems  All other systems reviewed and are negative.    Physical Exam Updated Vital Signs BP (!) 142/87   Pulse 72   Temp 97.9 F (36.6 C) (Oral)   Resp 18   Ht 1.88 m ( )   Wt 105.2 kg (232 lb)   SpO2 98%   BMI 29.79 kg/m   Physical Exam  Constitutional: He is oriented to person, place, and time. He appears well-developed and well-nourished.  HENT:  Head: Normocephalic and atraumatic.  Right Ear: External ear normal.  Left Ear: External ear normal.  Mouth/Throat: Oropharynx is clear and moist.  Eyes: Pupils are equal, round, and reactive to light. EOM are normal.  Neck: Normal range of motion. Neck supple.  Cardiovascular: Normal rate, regular rhythm and normal heart sounds.  Pulmonary/Chest: Effort normal and breath sounds normal.  Abdominal: Soft. Bowel sounds are normal.  Musculoskeletal:  Trace edema bilateral feet Toes are normal color Dorsal pedalis pulses are intact Bilateral calves are soft with no edema on lower legs  Neurological: He is alert and oriented to person, place, and time.  Skin: Skin is warm and dry. Capillary refill takes less than 2  seconds.  Psychiatric: He has a normal mood and affect.  Nursing note and vitals reviewed.    ED Treatments / Results  Labs (all labs ordered are listed, but only abnormal results are displayed) Labs Reviewed  CBC - Abnormal; Notable for the following components:      Result Value   RBC 3.11 (*)    Hemoglobin 11.3 (*)    HCT 32.6 (*)    MCV 104.8 (*)    MCH 36.3 (*)    Platelets 117 (*)    All other components within normal limits  COMPREHENSIVE METABOLIC PANEL - Abnormal; Notable for the following components:   Potassium 3.1 (*)    Glucose, Bld 154 (*)    BUN <5 (*)    Albumin 3.2 (*)    AST 47 (*)    Alkaline Phosphatase 136 (*)    Total Bilirubin 4.0 (*)    All other components within normal limits    EKG None  Radiology Dg Chest 2 View  Result Date: 08/21/2017 CLINICAL DATA:  Bilateral leg swelling.  No chest complaints. EXAM: CHEST - 2 VIEW COMPARISON:  05/22/2017 FINDINGS: The heart size and mediastinal contours are within normal limits. Both lungs are clear. The visualized skeletal structures are unremarkable. IMPRESSION: No active cardiopulmonary disease. Electronically Signed   By: Elige Ko   On: 08/21/2017 10:54    Procedures Procedures (including critical care time)  Medications Ordered in ED Medications  furosemide (LASIX) tablet 40 mg (40 mg Oral Given 08/21/17 1005)     Initial Impression / Assessment and Plan / ED Course  I have reviewed the triage vital signs and the nursing notes.  Pertinent labs & imaging results that were available during my care of the patient were reviewed by me and considered in my medical decision making (see chart for details).     57 year old man with known hep C presents today with some increased pedal edema.  He is also somewhat hypokalemic   Lasix and potassium repletion given in the ED Glucose 154-patient advised to have rechecked with primary care doctor AST elevated at 47 and bilirubin at 4.0 reviewed previous  bilirubin with previous high of 4.2 in July.  Plan Lasix, potassium repletion, close follow-up blood sugar and bilirubin with primary care and GI. 1:07 PM  Went to discuss with patient and he is not in room Nursing states he did not let them know he was leaving Final Clinical Impressions(s) / ED Diagnoses   Final diagnoses:  Pedal edema  Hypokalemia  Hyperbilirubinemia    ED Discharge Orders    None       Margarita Grizzle, MD 08/21/17 1244    Margarita Grizzle, MD 08/21/17 [phone]

## 2017-08-21 NOTE — ED Notes (Addendum)
Pt not in room at time of re-assessment. Pt not in waiting room.

## 2017-08-21 NOTE — ED Notes (Signed)
Returned from xray

## 2017-08-21 NOTE — Discharge Instructions (Signed)
Call your primary care doctor for recheck Recheck of elevated bilirubin with your doctor this week.

## 2017-08-21 NOTE — ED Notes (Signed)
Pt left without d/c paperwork or prescriptions.  Called pt to notify him of prescriptions but no answer.  Left message for pt to call charge RN

## 2017-08-21 NOTE — ED Triage Notes (Signed)
Pt c/o of bilateral leg swelling. Denies HX of heart failure. Pt states "this is brand new and has been going on for a week"

## 2017-08-28 DIAGNOSIS — B182 Chronic viral hepatitis C: Secondary | ICD-10-CM | POA: Diagnosis not present

## 2017-09-06 ENCOUNTER — Encounter: Payer: Self-pay | Admitting: Nurse Practitioner

## 2017-09-06 ENCOUNTER — Telehealth: Payer: Self-pay | Admitting: Gastroenterology

## 2017-09-06 ENCOUNTER — Other Ambulatory Visit: Payer: Self-pay

## 2017-09-06 ENCOUNTER — Ambulatory Visit (INDEPENDENT_AMBULATORY_CARE_PROVIDER_SITE_OTHER): Payer: Medicare HMO | Admitting: Nurse Practitioner

## 2017-09-06 VITALS — BP 138/79 | HR 72 | Temp 97.3°F | Ht 74.0 in | Wt 233.6 lb

## 2017-09-06 DIAGNOSIS — K7460 Unspecified cirrhosis of liver: Secondary | ICD-10-CM

## 2017-09-06 DIAGNOSIS — K766 Portal hypertension: Secondary | ICD-10-CM

## 2017-09-06 DIAGNOSIS — I8510 Secondary esophageal varices without bleeding: Secondary | ICD-10-CM

## 2017-09-06 DIAGNOSIS — K7030 Alcoholic cirrhosis of liver without ascites: Secondary | ICD-10-CM

## 2017-09-06 DIAGNOSIS — R69 Illness, unspecified: Secondary | ICD-10-CM | POA: Diagnosis not present

## 2017-09-06 DIAGNOSIS — B182 Chronic viral hepatitis C: Secondary | ICD-10-CM

## 2017-09-06 DIAGNOSIS — B1920 Unspecified viral hepatitis C without hepatic coma: Secondary | ICD-10-CM | POA: Insufficient documentation

## 2017-09-06 DIAGNOSIS — F1021 Alcohol dependence, in remission: Secondary | ICD-10-CM

## 2017-09-06 NOTE — Telephone Encounter (Signed)
Pt called to say he needed a prescription of Lasix 40 mg once a day called into [facility] on [address]

## 2017-09-06 NOTE — Assessment & Plan Note (Signed)
History of alcoholism likely contributing to cirrhosis.  He has not had any alcohol since December 2018.  He was congratulated on this accomplishment.  Recommend he continue to abstain from alcohol.  He is currently undergoing treatment for hepatitis C.  If he is able to abstain, and complete treatment for hepatitis C with eradication he has a much better outlook on his cirrhosis done if he were to not.  His last meld score is 15 but I suspect this may improve after hepatitis C treatment and with alcohol abstinence.  We will check routine labs and right upper quadrant imaging at this time.  Follow-up in 6 months.

## 2017-09-06 NOTE — Assessment & Plan Note (Signed)
Known esophageal varices for which she is undergone 3 EGDs with variceal band ligation.  He is currently due for repeat procedure.  No melena or hematochezia.  We will proceed with scheduling at this time.  Proceed with EGD +/- variceal band ligation on propofol/MAC with Dr. Oneida Alar in near future: the risks, benefits, and alternatives have been discussed with the patient in detail. The patient states understanding and desires to proceed.  Patient is currently on Xanax, Neurontin, Latuda, Zoloft.  History of excessive alcohol abuse, not currently drinking alcohol.  Denies recreational drugs.  Given polypharmacy we will plan for the procedure on propofol/MAC to promote adequate sedation.

## 2017-09-06 NOTE — Progress Notes (Addendum)
REVIEWED-NO ADDITIONAL RECOMMENDATIONS.  Referring Provider: Saralyn Pilar * Primary Care Physician:  Smitty Cords, DO Primary GI:  Dr. Darrick Penna  Chief Complaint  Patient presents with  . Cirrhosis    LE swelling    HPI:   Christian Sanders is a 57 y.o. male who presents for follow-up on cirrhosis and to schedule repeat EGD.  The patient was last seen in our office 09/20/2016 for alcoholic cirrhosis, portal hypertension, splenomegaly.  Cirrhosis was found in the emergency department just prior to his previous office visit with elevated albumin at 4.2, AST/ALT elevated as well as alkaline phosphatase.  Platelet count 124.  He describes his drinking as previously "used to drink a lot, about a case a day, but now down to 1-2 drinks a day".  He has been counseled on alcohol cessation.  Recommended ultrasound of the liver, EGD with possible banding.  Exam elastography showed some F3 and F4 with high risk of fibrosis.  On 12/29/2016 complete serological work-up was undertaken.  AST mildly elevated at 40, normal ALT.  Normal alkaline phosphatase.  Total bilirubin 3.6.  AFP was elevated at 9.4 and INR elevated at 1.4.  Hepatitis B and hepatitis A serologies negative for immunization or infection.  Hepatitis C was canceled by the lab for some unknown reason.  Meld score calculated at 15, child Pugh B at that time.  Recommended MRI due to elevated AFP.  This was completed and found cirrhosis, no liver masses.  Mild wall thickening of the gallbladder unchanged and likely due to cirrhosis.  Splenomegaly noted with length of 17 cm.  At his last visit he was doing okay overall.  Mild nausea and vomiting if he takes Jordan.  Chronic decreased appetite.  Intermittent toilet tissue hematochezia with a history of hemorrhoids.  Previous colonoscopy 2 years ago and cleared for 10 years in [place], [place].  Occasional lower extremity edema.  No other hepatic or GI symptoms.  At that time it was  noted he had started his hepatitis A/B vaccination series.  Recommended updated labs, schedule EGD with possible banding, follow-up in 3 months.  His hepatitis C antibody did come back as positive.  Recommended complete alcohol cessation prior to treatment.  He was informed he indicated no alcohol for 2+ weeks.  He is interested in hepatitis C treatment.  Multiple EGDs completed for variceal band ligation.  On 04/17/2017 found grade 3 varices status post eradication with band ligation.  Repeat 05/22/2017 which found grade 2 and 3 varices status post eradication with band ligation.  EGD last completed 06/26/2017 which found grade 3 esophageal varices completely eradicated with banding x2, portal hypertensive gastropathy.  Recommended repeat EGD in 3 months for endoscopic band ligation.  Cipro 500 mg twice daily for 7 days.  Stop omeprazole or H2 receptor blocker during hepatitis C treatment.  Use viscous lidocaine if needed.  He was referred to the liver clinic in [facility] for hepatitis C treatment due to decompensated liver disease.  Today he states he's noted some increased LE edema. She saw Annamarie Major, NP at the [facility] in [place]. He is still on Epclusa and Ribavirin, had non-detectable virus at 4 weeks post-initiation. Knows to not start any new medications without checking with her. Denies abdominal pain, vomiting, hematochezia, melena, fever, chills, unintentional weight loss. Denies yellowing of skin/eyes, darkened urine, tremors/shakes, acute episodic confusion, generalized pruritis, abdominal swelling. Ankles began swelling 1-2 months ago. BP is good today. Denies chest pain, dyspnea, dizziness, lightheadedness, syncope, near  syncope. Denies any other upper or lower GI symptoms.  Past Medical History:  Diagnosis Date  . Abnormal CT of liver 12/20/2016  . Anxiety   . Bipolar disorder (HCC)   . Dentures complicating chewing    full upper, partial lower  . Depression   . Esophageal  varices determined by endoscopy (HCC)   . Gastritis and duodenitis   . GERD (gastroesophageal reflux disease)   . Headache    chronic, s/p facial injury and reconstruction  . Hypertension   . Portal hypertensive gastropathy (HCC)   . PTSD (post-traumatic stress disorder)   . Vertebral column disorder    2 crushed thoracic vertebra    Past Surgical History:  Procedure Laterality Date  . BIOPSY  04/17/2017   Procedure: BIOPSY;  Surgeon: West Bali, MD;  [facility];  Service: Endoscopy;;  gastric for h pylori  . COLONOSCOPY WITH PROPOFOL N/A 10/09/2014   Procedure: COLONOSCOPY WITH PROPOFOL;  Surgeon: Midge Minium, MD;  Location: [facility];  Service: Endoscopy;  Laterality: N/A;  . ESOPHAGEAL BANDING N/A 04/17/2017   Procedure: ESOPHAGEAL BANDING;  Surgeon: West Bali, MD;  [facility];  Service: Endoscopy;  Laterality: N/A;  . ESOPHAGEAL BANDING N/A 05/22/2017   Procedure: ESOPHAGEAL BANDING;  Surgeon: West Bali, MD;  [facility];  Service: Endoscopy;  Laterality: N/A;  . ESOPHAGEAL BANDING N/A 06/26/2017   Procedure: ESOPHAGEAL BANDING;  Surgeon: West Bali, MD;  [facility];  Service: Endoscopy;  Laterality: N/A;  . ESOPHAGOGASTRODUODENOSCOPY (EGD) WITH PROPOFOL N/A 04/17/2017   Procedure: ESOPHAGOGASTRODUODENOSCOPY (EGD) WITH PROPOFOL;  Surgeon: West Bali, MD;  [facility];  Service: Endoscopy;  Laterality: N/A;  10:45am  . ESOPHAGOGASTRODUODENOSCOPY (EGD) WITH PROPOFOL N/A 05/22/2017   Procedure: ESOPHAGOGASTRODUODENOSCOPY (EGD) WITH PROPOFOL;  Surgeon: West Bali, MD;  [facility];  Service: Endoscopy;  Laterality: N/A;  . ESOPHAGOGASTRODUODENOSCOPY (EGD) WITH PROPOFOL N/A 06/26/2017   Procedure: ESOPHAGOGASTRODUODENOSCOPY (EGD) WITH PROPOFOL;  Surgeon: West Bali, MD;  [facility];  Service: Endoscopy;  Laterality: N/A;  1:45pm  . FACIAL RECONSTRUCTION SURGERY       eye socket with 2 metal plates    Current Outpatient Medications  Medication Sig Dispense Refill  . ALPRAZolam (XANAX) 0.5 MG tablet Take 0.5-1 tablets (0.25-0.5 mg total) by mouth 2 (two) times daily as needed for anxiety. 60 tablet 1  . amLODipine (NORVASC) 5 MG tablet Take 1 tablet (5 mg total) by mouth daily. 30 tablet 5  . EPCLUSA 400-100 MG TABS   2  . fluticasone (FLONASE) 50 MCG/ACT nasal spray PLACE 2 SPRAYS INTO BOTH NOSTRILS DAILY. USE FOR 4-6 WKS THEN STOP AND USE SEASONALLY OR AS NEEDED. 16 g 2  . furosemide (LASIX) 40 MG tablet Take 1 tablet (40 mg total) by mouth daily. 10 tablet 0  . gabapentin (NEURONTIN) 100 MG capsule Start 1 capsule daily, increase by 1 cap every 2-3 days as tolerated up to 3 times a day, or may take 3 at once in evening. (Patient taking differently: Take 100 mg by mouth as needed. ) 270 capsule 1  . lurasidone (LATUDA) 20 MG TABS tablet Take 1 tablet (20 mg total) by mouth daily. (Patient taking differently: Take 20 mg by mouth as needed. ) 30 tablet 1  . ondansetron (ZOFRAN-ODT) 4 MG disintegrating tablet Take 4 mg by mouth 3 (three) times daily.     Marland Kitchen oxyCODONE (ROXICODONE) 5  MG immediate release tablet One tablet every six hours as needed for pain. 30 tablet 0  . ribavirin (COPEGUS) 200 MG tablet   2  . sertraline (ZOLOFT) 50 MG tablet Take 1 tablet (50 mg total) by mouth daily. 90 tablet 0  . sildenafil (REVATIO) 20 MG tablet TAKE 1 TO 5 TABLETS BY MOUTH ONCE DAILY AS NEEDED (Patient taking differently: Take 20-100 mg by mouth as needed (for ED). ) 50 tablet 5  . omeprazole (PRILOSEC) 20 MG capsule Take 20 mg by mouth daily.     No current facility-administered medications for this visit.     Allergies as of 09/06/2017 - Review Complete 09/06/2017  Allergen Reaction Noted  . Bee venom Swelling and Other (See Comments) 12/28/2015  . Ibuprofen Other (See Comments) 05/11/2017  . Pineapple Nausea And Vomiting 10/06/2014  . Pollen extract Other (See  Comments) 06/03/2015  . Tylenol [acetaminophen] Other (See Comments) 05/11/2017    Family History  Problem Relation Age of Onset  . Leukemia Father   . Colon cancer Neg Hx   . Liver disease Neg Hx     Social History   Socioeconomic History  . Marital status: Single    Spouse name: Not on file  . Number of children: Not on file  . Years of education: Not on file  . Highest education level: Not on file  Occupational History  . Not on file  Social Needs  . Financial resource strain: Not on file  . Food insecurity:    Worry: Not on file    Inability: Not on file  . Transportation needs:    Medical: Not on file    Non-medical: Not on file  Tobacco Use  . Smoking status: Current Every Day Smoker    Packs/day: 0.25    Years: 30.00    Pack years: 7.50    Types: Cigarettes  . Smokeless tobacco: Never Used  Substance and Sexual Activity  . Alcohol use: No    Alcohol/week: 3.6 oz    Types: 6 Cans of beer per week    Frequency: Never    Comment: None as of 03/08/17; previously 1-1.5 cases of beer a day in his 7120s.  . Drug use: No  . Sexual activity: Never    Birth control/protection: None  Lifestyle  . Physical activity:    Days per week: Not on file    Minutes per session: Not on file  . Stress: Not on file  Relationships  . Social connections:    Talks on phone: Not on file    Gets together: Not on file    Attends religious service: Not on file    Active member of club or organization: Not on file    Attends meetings of clubs or organizations: Not on file    Relationship status: Not on file  Other Topics Concern  . Not on file  Social History Narrative  . Not on file    Review of Systems: Complete ROS negative except as per HPI.   Physical Exam: BP 138/79   Pulse 72   Temp (!) 97.3 F (36.3 C) (Oral)   Ht 6\' 2"  (1.88 m)   Wt 233 lb 9.6 oz (106 kg)   BMI 29.99 kg/m  General:   Alert and oriented. Pleasant and cooperative. Well-nourished and  well-developed.  Eyes:  Without icterus, sclera clear and conjunctiva pink.  Ears:  Normal auditory acuity. Cardiovascular:  S1, S2 present without murmurs appreciated. Extremities without clubbing  or edema. Respiratory:  Clear to auscultation bilaterally. No wheezes, rales, or rhonchi. No distress.  Gastrointestinal:  +BS, soft, non-tender and non-distended. No HSM noted. No guarding or rebound. No masses appreciated.  Rectal:  Deferred  Musculoskalatal:  Symmetrical without gross deformities. Neurologic:  Alert and oriented x4;  grossly normal neurologically. Psych:  Alert and cooperative. Normal mood and affect. Heme/Lymph/Immune: No excessive bruising noted.    09/06/2017 11:29 AM   Disclaimer: This note was dictated with voice recognition software. Similar sounding words can inadvertently be transcribed and may not be corrected upon review.

## 2017-09-06 NOTE — Patient Instructions (Signed)
1. When you get home, check your medications and call us to let us know which medications you are currently taking. 2. Specifically, let us know if you are taking furosemide (Lasix) 40 mg once a day. 3. I will check with Dawn to see if it is okay to add Aldactone.  I suspect there will be no problems. 4. We will schedule your upper endoscopy with possible variceal banding for you. 5. Have your labs checked when you are able to. 6. We will help you schedule your ultrasound of your liver. 7. Follow-up in 6 months. 8. Call us if you have any questions or concerns.  At [facility] we value your feedback. You may receive a survey about your visit today. Please share your experience as we strive to create trusting relationships with our patients to provide genuine, compassionate, quality care.  It was great to see you today!  Congratulations on abstaining from alcohol!!  I hope you have a wonderful summer!!

## 2017-09-06 NOTE — Assessment & Plan Note (Signed)
Cirrhosis likely multifactorial in nature with chronic hepatitis C infection (currently undergoing treatment) and alcoholism which is currently in remission.  He has not had any alcohol since December 2018, and he is congratulated for this.  Generally asymptomatic from a GI and hepatic standpoint other than lower extremity edema.  He appears to be on Lasix 40 mg.  However, this medicine does not sound familiar to him.  He will check his medications at home and call and let us know.  If he is on Lasix, we will consider starting Aldactone as well.  He does have 1+ pitting edema in the right lower extremity and 1-2+ pitting edema in the left lower extremity up to the mid calf area.  We will update labs including CMP, INR, AFP.  We will also update right upper quadrant ultrasound for hepatoma screening.  Follow-up in 6 months.

## 2017-09-06 NOTE — Assessment & Plan Note (Signed)
Chronic hepatitis C infection in addition to alcoholic cirrhosis.  He is currently undergoing treatment of the liver clinic in [place].  The patient states his 4-week post treatment initiation viral load was undetectable.  He is still taking medicine, coming up on 2 months.  Recommend he follow instructions strictly from Annamarie Majorawn Drazek, NP at the liver clinic.  Follow-up based on her recommendations.  Follow-up here in 6 months.

## 2017-09-06 NOTE — Telephone Encounter (Signed)
PATIENT RETURNED CALL, PLEASE CALL BACK  °

## 2017-09-06 NOTE — Telephone Encounter (Signed)
LMOM for a return call.  

## 2017-09-06 NOTE — Progress Notes (Signed)
cc'ed to pcp °

## 2017-09-06 NOTE — Assessment & Plan Note (Signed)
Known portal hypertension with splenomegaly in this patient with chronic cirrhosis.  Platelet count mildly depressed in the 1 teens to 140s.  He does have esophageal varices and is undergoing systematic variceal band ligation.  He is currently due for his repeat exam as per below.  Follow-up in 6 months.

## 2017-09-06 NOTE — Telephone Encounter (Signed)
PT called in reference to his medications. He has not been taking anything for fluid, the prescription for the Furosemide was given by ED physician and he did not pick up. He left the hospital before discharge because of the long wait.  Christian Sanders, the previous note said he is asking for Lasix 40 mg. Please advise!

## 2017-09-07 ENCOUNTER — Encounter (HOSPITAL_COMMUNITY)
Admission: RE | Admit: 2017-09-07 | Discharge: 2017-09-07 | Disposition: A | Discharge status: Home / Self Care | Payer: Medicare HMO | Source: Ambulatory Visit | Attending: Gastroenterology | Admitting: Gastroenterology

## 2017-09-07 ENCOUNTER — Encounter (HOSPITAL_COMMUNITY): Payer: Self-pay

## 2017-09-07 MED ORDER — FUROSEMIDE 20 MG PO TABS: 20.0000 mg | ORAL_TABLET | Freq: Every day | ORAL | 3 refills | Status: DC

## 2017-09-07 NOTE — Telephone Encounter (Addendum)
LE edema, no apparent ascites. Will start him on Lasix 20 mg daily. Check BMP 2 weeks after starting diuretic for serum potassium level.  Attempted to call patient and left un-detailed VM. Please call the patient on Monday and discuss the above with him.

## 2017-09-07 NOTE — Addendum Note (Signed)
Addended by: Delane GingerGILL, ERIC A on: 09/07/2017 02:41 PM   Modules accepted: Orders

## 2017-09-10 ENCOUNTER — Other Ambulatory Visit: Payer: Self-pay

## 2017-09-10 DIAGNOSIS — K7460 Unspecified cirrhosis of liver: Secondary | ICD-10-CM

## 2017-09-10 NOTE — Telephone Encounter (Signed)
Pt is aware and has already begun the medication. I am mailing the lab orders to him to do on 09/21/2017.

## 2017-09-11 ENCOUNTER — Encounter (HOSPITAL_COMMUNITY)
Admission: RE | Disposition: A | Discharge status: Home / Self Care | Payer: Self-pay | Source: Ambulatory Visit | Attending: Gastroenterology

## 2017-09-11 ENCOUNTER — Ambulatory Visit (HOSPITAL_COMMUNITY): Payer: Medicare HMO | Admitting: Anesthesiology

## 2017-09-11 ENCOUNTER — Ambulatory Visit (HOSPITAL_COMMUNITY)
Admission: RE | Admit: 2017-09-11 | Discharge: 2017-09-11 | Disposition: A | Discharge status: Home / Self Care | Payer: Medicare HMO | Source: Ambulatory Visit | Attending: Gastroenterology | Admitting: Gastroenterology

## 2017-09-11 ENCOUNTER — Encounter (HOSPITAL_COMMUNITY): Payer: Self-pay | Admitting: *Deleted

## 2017-09-11 DIAGNOSIS — K7030 Alcoholic cirrhosis of liver without ascites: Secondary | ICD-10-CM

## 2017-09-11 DIAGNOSIS — F17210 Nicotine dependence, cigarettes, uncomplicated: Secondary | ICD-10-CM | POA: Insufficient documentation

## 2017-09-11 DIAGNOSIS — I8510 Secondary esophageal varices without bleeding: Secondary | ICD-10-CM

## 2017-09-11 DIAGNOSIS — I8500 Esophageal varices without bleeding: Secondary | ICD-10-CM | POA: Diagnosis not present

## 2017-09-11 DIAGNOSIS — Z79899 Other long term (current) drug therapy: Secondary | ICD-10-CM | POA: Diagnosis not present

## 2017-09-11 DIAGNOSIS — F319 Bipolar disorder, unspecified: Secondary | ICD-10-CM | POA: Insufficient documentation

## 2017-09-11 DIAGNOSIS — F4310 Post-traumatic stress disorder, unspecified: Secondary | ICD-10-CM | POA: Diagnosis not present

## 2017-09-11 DIAGNOSIS — I10 Essential (primary) hypertension: Secondary | ICD-10-CM | POA: Insufficient documentation

## 2017-09-11 DIAGNOSIS — K766 Portal hypertension: Secondary | ICD-10-CM | POA: Insufficient documentation

## 2017-09-11 DIAGNOSIS — R69 Illness, unspecified: Secondary | ICD-10-CM | POA: Diagnosis not present

## 2017-09-11 DIAGNOSIS — K3189 Other diseases of stomach and duodenum: Secondary | ICD-10-CM | POA: Diagnosis not present

## 2017-09-11 DIAGNOSIS — K219 Gastro-esophageal reflux disease without esophagitis: Secondary | ICD-10-CM | POA: Diagnosis not present

## 2017-09-11 DIAGNOSIS — Z800 Family history of malignant neoplasm of digestive organs: Secondary | ICD-10-CM | POA: Diagnosis not present

## 2017-09-11 HISTORY — PX: ESOPHAGOGASTRODUODENOSCOPY (EGD) WITH PROPOFOL: SHX5813

## 2017-09-11 LAB — POCT I-STAT 4, (NA,K, GLUC, HGB,HCT)
Glucose, Bld: 95 mg/dL (ref 65–99)
HEMATOCRIT: 37 % — AB (ref 39.0–52.0)
HEMOGLOBIN: 12.6 g/dL — AB (ref 13.0–17.0)
Potassium: 4.6 mmol/L (ref 3.5–5.1)
Sodium: 142 mmol/L (ref 135–145)

## 2017-09-11 SURGERY — ESOPHAGOGASTRODUODENOSCOPY (EGD) WITH PROPOFOL
Anesthesia: Monitor Anesthesia Care

## 2017-09-11 MED ORDER — PROPOFOL 500 MG/50ML IV EMUL
INTRAVENOUS | Status: DC | PRN
  Administered 2017-09-11: 135 ug/kg/min via INTRAVENOUS
  Administered 2017-09-11: 11:00:00 via INTRAVENOUS

## 2017-09-11 MED ORDER — CIPROFLOXACIN IN D5W 400 MG/200ML IV SOLN
400.0000 mg | Freq: Once | INTRAVENOUS | Status: AC
  Administered 2017-09-11: 400 mg via INTRAVENOUS

## 2017-09-11 MED ORDER — MIDAZOLAM HCL 5 MG/5ML IJ SOLN
INTRAMUSCULAR | Status: DC | PRN
  Administered 2017-09-11: 2 mg via INTRAVENOUS

## 2017-09-11 MED ORDER — PROPOFOL 10 MG/ML IV BOLUS
INTRAVENOUS | Status: AC
  Filled 2017-09-11: qty 40

## 2017-09-11 MED ORDER — HYDROMORPHONE HCL 1 MG/ML IJ SOLN: 0.2500 mg | INTRAMUSCULAR | Status: DC | PRN

## 2017-09-11 MED ORDER — CHLORHEXIDINE GLUCONATE CLOTH 2 % EX PADS: 6.0000 | MEDICATED_PAD | Freq: Once | CUTANEOUS | Status: DC

## 2017-09-11 MED ORDER — GLYCOPYRROLATE 0.2 MG/ML IJ SOLN
INTRAMUSCULAR | Status: DC | PRN
  Administered 2017-09-11: 0.2 mg via INTRAVENOUS

## 2017-09-11 MED ORDER — CIPROFLOXACIN IN D5W 400 MG/200ML IV SOLN
INTRAVENOUS | Status: AC
  Filled 2017-09-11: qty 200

## 2017-09-11 MED ORDER — LIDOCAINE VISCOUS HCL 2 % MT SOLN
3.0000 mL | OROMUCOSAL | Status: DC | PRN
  Administered 2017-09-11: 3 mL via OROMUCOSAL

## 2017-09-11 MED ORDER — LACTATED RINGERS IV SOLN
INTRAVENOUS | Status: DC
  Administered 2017-09-11: 1000 mL via INTRAVENOUS

## 2017-09-11 MED ORDER — MIDAZOLAM HCL 2 MG/2ML IJ SOLN
INTRAMUSCULAR | Status: AC
  Filled 2017-09-11: qty 2

## 2017-09-11 MED ORDER — PROMETHAZINE HCL 25 MG/ML IJ SOLN
6.2500 mg | INTRAMUSCULAR | Status: DC | PRN
Start: 2017-09-11 — End: 2017-09-11

## 2017-09-11 MED ORDER — LIDOCAINE VISCOUS HCL 2 % MT SOLN
OROMUCOSAL | Status: AC
  Filled 2017-09-11: qty 15

## 2017-09-11 MED ORDER — LACTATED RINGERS IV SOLN: INTRAVENOUS | Status: DC

## 2017-09-11 MED ORDER — MEPERIDINE HCL 100 MG/ML IJ SOLN: 6.2500 mg | INTRAMUSCULAR | Status: DC | PRN

## 2017-09-11 MED ORDER — PROPOFOL 10 MG/ML IV BOLUS
INTRAVENOUS | Status: AC
  Filled 2017-09-11: qty 20

## 2017-09-11 NOTE — H&P (Signed)
Primary Care Physician:  Smitty Cords, DO Primary Gastroenterologist:  Dr. Darrick Penna  Pre-Procedure History & Physical: HPI:  Christian Sanders is a 57 y.o. male here for SCREENING for VARICES.  Past Medical History:  Diagnosis Date  . Abnormal CT of liver 12/20/2016  . Anxiety   . Bipolar disorder (HCC)   . Dentures complicating chewing    full upper, partial lower  . Depression   . Esophageal varices determined by endoscopy (HCC)   . Gastritis and duodenitis   . GERD (gastroesophageal reflux disease)   . Headache    chronic, s/p facial injury and reconstruction  . Hypertension   . Portal hypertensive gastropathy (HCC)   . PTSD (post-traumatic stress disorder)   . Vertebral column disorder    2 crushed thoracic vertebra    Past Surgical History:  Procedure Laterality Date  . BIOPSY  04/17/2017   Procedure: BIOPSY;  Surgeon: West Bali, MD;  Location: [facility];  Service: Endoscopy;;  gastric for h pylori  . COLONOSCOPY WITH PROPOFOL N/A 10/09/2014   Procedure: COLONOSCOPY WITH PROPOFOL;  Surgeon: Midge Minium, MD;  Location[facility];  Service: Endoscopy;  Laterality: N/A;  . ESOPHAGEAL BANDING N/A 04/17/2017   Procedure: ESOPHAGEAL BANDING;  Surgeon: West Bali, MD;  Location: [facility];  Service: Endoscopy;  Laterality: N/A;  . ESOPHAGEAL BANDING N/A 05/22/2017   Procedure: ESOPHAGEAL BANDING;  Surgeon: West Bali, MD;  Location: [facility];  Service: Endoscopy;  Laterality: N/A;  . ESOPHAGEAL BANDING N/A 06/26/2017   Procedure: ESOPHAGEAL BANDING;  Surgeon: West Bali, MD;  Location: [facility];  Service: Endoscopy;  Laterality: N/A;  . ESOPHAGOGASTRODUODENOSCOPY (EGD) WITH PROPOFOL N/A 04/17/2017   Procedure: ESOPHAGOGASTRODUODENOSCOPY (EGD) WITH PROPOFOL;  Surgeon: West Bali, MD;  Location: [facility];  Service: Endoscopy;  Laterality: N/A;  10:45am  . ESOPHAGOGASTRODUODENOSCOPY (EGD) WITH PROPOFOL N/A 05/22/2017   Procedure: ESOPHAGOGASTRODUODENOSCOPY (EGD) WITH PROPOFOL;  Surgeon: West Bali, MD;  Location: [facility];  Service: Endoscopy;  Laterality: N/A;  . ESOPHAGOGASTRODUODENOSCOPY (EGD) WITH PROPOFOL N/A 06/26/2017   Procedure: ESOPHAGOGASTRODUODENOSCOPY (EGD) WITH PROPOFOL;  Surgeon: West Bali, MD;  Location: [facility];  Service: Endoscopy;  Laterality: N/A;  1:45pm  . FACIAL RECONSTRUCTION SURGERY     eye socket with 2 metal plates    Prior to Admission medications   Medication Sig Start Date End Date Taking? Authorizing Provider  ALPRAZolam Prudy Feeler) 0.5 MG tablet Take 0.5-1 tablets (0.25-0.5 mg total) by mouth 2 (two) times daily as needed for anxiety. 08/23/17  Yes Hisada, Barbee Cough, MD  amLODipine (NORVASC) 5 MG tablet Take 1 tablet (5 mg total) by mouth daily. 05/08/17  Yes Karamalegos, Alexander J, DO  EPCLUSA 400-100 MG TABS Take 1 tablet by mouth at bedtime.  07/27/17  Yes [provider]  fluticasone (FLONASE) 50 MCG/ACT nasal spray PLACE 2 SPRAYS INTO BOTH NOSTRILS DAILY. USE FOR 4-6 WKS THEN STOP AND USE SEASONALLY OR AS NEEDED. Patient taking differently: PLACE 2 SPRAYS INTO BOTH NOSTRILS DAILY AS NEEDED FOR ALLERGIES. 07/31/17  Yes Karamalegos, Netta Neat, DO  furosemide (LASIX) 20 MG tablet Take 1 tablet (20 mg total) by mouth daily. 09/07/17  Yes Anice Paganini, NP  gabapentin (NEURONTIN) 100 MG capsule Start 1 capsule daily, increase by 1 cap every 2-3 days as tolerated up to 3 times a day, or may take 3 at once in evening. Patient taking differently: Take 100-200 mg by mouth 3 (  three) times daily as needed (for pain.).  06/05/17  Yes Karamalegos, Netta Neat, DO  lurasidone (LATUDA) 20 MG TABS tablet Take 1 tablet (20 mg total) by mouth daily. 06/13/17  Yes Hisada, Barbee Cough, MD  Melatonin 10 MG TABS Take 30 mg by mouth at bedtime.   Yes [provider]  ondansetron (ZOFRAN-ODT) 4 MG disintegrating tablet Take 4 mg by mouth every 8 (eight) hours as needed (for  nausea/vomiting.).  08/14/17  Yes [provider]  ribavirin (COPEGUS) 200 MG tablet Take 200-400 mg by mouth See admin instructions. Take 2 capsules (400 mg) in the morning & 1 capsule (200 mg) in the evening. 07/27/17  Yes [provider]  sertraline (ZOLOFT) 50 MG tablet Take 1 tablet (50 mg total) by mouth daily. 07/06/17  Yes Hisada, Barbee Cough, MD  sildenafil (REVATIO) 20 MG tablet TAKE 1 TO 5 TABLETS BY MOUTH ONCE DAILY AS NEEDED Patient taking differently: Take 20-100 mg by mouth as needed (for ED).  05/08/17  Yes Karamalegos, Netta Neat, DO  oxyCODONE (ROXICODONE) 5 MG immediate release tablet One tablet every six hours as needed for pain. Patient not taking: Reported on 09/06/2017 08/16/17   Darreld Mclean, MD    Allergies as of 09/06/2017 - Review Complete 09/06/2017  Allergen Reaction Noted  . Bee venom Swelling and Other (See Comments) 12/28/2015  . Ibuprofen Other (See Comments) 05/11/2017  . Pineapple Nausea And Vomiting 10/06/2014  . Pollen extract Other (See Comments) 06/03/2015  . Tylenol [acetaminophen] Other (See Comments) 05/11/2017    Family History  Problem Relation Age of Onset  . Leukemia Father   . Colon cancer Neg Hx   . Liver disease Neg Hx     Social History   Socioeconomic History  . Marital status: Single    Spouse name: Not on file  . Number of children: Not on file  . Years of education: Not on file  . Highest education level: Not on file  Occupational History  . Not on file  Social Needs  . Financial resource strain: Not on file  . Food insecurity:    Worry: Not on file    Inability: Not on file  . Transportation needs:    Medical: Not on file    Non-medical: Not on file  Tobacco Use  . Smoking status: Current Every Day Smoker    Packs/day: 0.25    Years: 30.00    Pack years: 7.50    Types: Cigarettes  . Smokeless tobacco: Never Used  Substance and Sexual Activity  . Alcohol use: No    Alcohol/week: 3.6 oz    Types: 6 Cans of  beer per week    Frequency: Never    Comment: None as of 03/08/17; previously 1-1.5 cases of beer a day in his 82s.  . Drug use: No  . Sexual activity: Never    Birth control/protection: None  Lifestyle  . Physical activity:    Days per week: Not on file    Minutes per session: Not on file  . Stress: Not on file  Relationships  . Social connections:    Talks on phone: Not on file    Gets together: Not on file    Attends religious service: Not on file    Active member of club or organization: Not on file    Attends meetings of clubs or organizations: Not on file    Relationship status: Not on file  . Intimate partner violence:    Fear of  current or ex partner: Not on file    Emotionally abused: Not on file    Physically abused: Not on file    Forced sexual activity: Not on file  Other Topics Concern  . Not on file  Social History Narrative  . Not on file    Review of Systems: See HPI, otherwise negative ROS   Physical Exam: BP 130/82   Pulse 71   Temp 98.2 F (36.8 C) (Oral)   Resp 18   SpO2 100%  General:   Alert,  pleasant and cooperative in NAD Head:  Normocephalic and atraumatic. Neck:  Supple; Lungs:  Clear throughout to auscultation.    Heart:  Regular rate and rhythm. Abdomen:  Soft, nontender and nondistended. Normal bowel sounds, without guarding, and without rebound.   Neurologic:  Alert and  oriented x4;  grossly normal neurologically.  Impression/Plan:     SCREENING VARICES  PLAN:  1.EGD TODAY DISCUSSED PROCEDURE, BENEFITS, & RISKS: < 1% chance of medication reaction, bleeding, OR perforation.

## 2017-09-11 NOTE — Op Note (Signed)
[facility] Patient Name: Christian Sanders Procedure Date: 09/11/2017 10:40 AM MRN: [id number] Date of Birth: 1960/10/21 Attending MD: Jonette Eva MD, MD CSN: [id number] Age: 57 Admit Type: Outpatient Procedure:                Upper GI endoscopy, DIAGNOSTIC Indications:              1st degree variceal eradication (no prior bleeding) Providers:                Jonette Eva MD, MD, Loma Messing B. Patsy Lager, RN, Dyann Ruddle Referring MD:             Smitty Cords Medicines:                Propofol per Anesthesia, Cipro 400 mg IV Complications:            No immediate complications. Estimated Blood Loss:     Estimated blood loss: none. Procedure:                Pre-Anesthesia Assessment:                           - Prior to the procedure, a History and Physical                            was performed, and patient medications and                            allergies were reviewed. The patient's tolerance of                            previous anesthesia was also reviewed. The risks                            and benefits of the procedure and the sedation                            options and risks were discussed with the patient.                            All questions were answered, and informed consent                            was obtained. Prior Anticoagulants: The patient has                            taken no previous anticoagulant or antiplatelet                            agents. ASA Grade Assessment: II - A patient with                            mild systemic disease. After reviewing the risks  and benefits, the patient was deemed in                            satisfactory condition to undergo the procedure.                            After obtaining informed consent, the endoscope was                            passed under direct vision. Throughout the                            procedure, the patient's blood  pressure, pulse, and                            oxygen saturations were monitored continuously. The                            [id number] ([id number]) scope was introduced through the                            mouth, and advanced to the duodenal bulb. The upper                            GI endoscopy was accomplished without difficulty.                            The patient tolerated the procedure well. Scope In: 11:19:48 AM Scope Out: 11:28:17 AM Total Procedure Duration: 0 hours 8 minutes 29 seconds  Findings:      Five columns of non-bleeding grade I, grade II varices were found in the       lower third of the esophagus,. They were medium in size. No stigmata of       recent bleeding were evident and no red wale signs were present.       Stigmata of prior treatment were evident.      Moderate portal hypertensive gastropathy was found in the cardia, in the       gastric fundus and in the gastric body.      Diffuse moderately erythematous mucosa without active bleeding and with       no stigmata of bleeding was found in the duodenal bulb.      The second portion of the duodenum was normal. Impression:               - Non-bleeding grade I and grade II esophageal                            varices.                           - MODERATE Portal hypertensive gastropathy.                           - Erythematous duodenopathy. Moderate Sedation:      Per Anesthesia Care Recommendation:           -  Resume previous diet.                           - Continue present medications. COMPLETE TREATMENT                            FOR HEP C.                           - Repeat upper endoscopy in 3 months(SEP 2019) for                            retreatment.                           - Return to GI office in 6 months.                           - Patient has a contact number available for                            emergencies. The signs and symptoms of potential                            delayed  complications were discussed with the                            patient. Return to normal activities tomorrow.                            Written discharge instructions were provided to the                            patient. Procedure Code(s):        --- Professional ---                           [id number], Esophagogastroduodenoscopy, flexible,                            transoral; diagnostic, including collection of                            specimen(s) by brushing or washing, when performed                            (separate procedure) Diagnosis Code(s):        --- Professional ---                           I85.00, Esophageal varices without bleeding                           K76.6, Portal hypertension                           K31.89, Other diseases of stomach and duodenum  CPT copyright 2017 [employer]. All rights reserved. The codes documented in this report are preliminary and upon coder review may  be revised to meet current compliance requirements. Jonette Eva, MD Jonette Eva MD, MD 09/11/2017 11:38:51 AM This report has been signed electronically. Number of Addenda: 0

## 2017-09-11 NOTE — Anesthesia Preprocedure Evaluation (Signed)
Anesthesia Evaluation  Patient identified by MRN, date of birth, ID band Patient awake    Reviewed: Allergy & Precautions, H&P , NPO status , Patient's Chart, lab work & pertinent test results, reviewed documented beta blocker date and time   Airway Mallampati: II  TM Distance: >3 FB Neck ROM: full    Dental no notable dental hx. (+) Poor Dentition, Edentulous Upper   Pulmonary neg pulmonary ROS, Current Smoker,    Pulmonary exam normal breath sounds clear to auscultation       Cardiovascular Exercise Tolerance: Good hypertension, On Medications negative cardio ROS   Rhythm:regular Rate:Normal     Neuro/Psych  Headaches, Anxiety Depression Bipolar Disorder negative neurological ROS  negative psych ROS   GI/Hepatic negative GI ROS, Neg liver ROS, GERD  ,(+) C  Endo/Other  negative endocrine ROS  Renal/GU negative Renal ROS  negative genitourinary   Musculoskeletal   Abdominal   Peds  Hematology negative hematology ROS (+)   Anesthesia Other Findings S/P multiple esophageal abandings secondary to alcohol induced varicies Hep C treat- last screen unable to detect virus Ongoing tobacco abuse  Reproductive/Obstetrics negative OB ROS                             Anesthesia Physical Anesthesia Plan  ASA: IV  Anesthesia Plan: MAC   Post-op Pain Management:    Induction:   PONV Risk Score and Plan:   Airway Management Planned:   Additional Equipment:   Intra-op Plan:   Post-operative Plan:   Informed Consent: I have reviewed the patients History and Physical, chart, labs and discussed the procedure including the risks, benefits and alternatives for the proposed anesthesia with the patient or authorized representative who has indicated his/her understanding and acceptance.   Dental Advisory Given  Plan Discussed with: CRNA and Anesthesiologist  Anesthesia Plan Comments:          Anesthesia Quick Evaluation

## 2017-09-11 NOTE — Anesthesia Postprocedure Evaluation (Signed)
Anesthesia Post Note  Patient: Christian Sanders  Procedure(s) Performed: ESOPHAGOGASTRODUODENOSCOPY (EGD) WITH PROPOFOL (N/A )  Patient location during evaluation: PACU Anesthesia Type: MAC Level of consciousness: awake and patient cooperative Pain management: pain level controlled Vital Signs Assessment: post-procedure vital signs reviewed and stable Respiratory status: spontaneous breathing, nonlabored ventilation and respiratory function stable Cardiovascular status: blood pressure returned to baseline Postop Assessment: no apparent nausea or vomiting Anesthetic complications: no     Last Vitals:  Vitals:   09/11/17 0903 09/11/17 0904  BP:  130/82  Pulse: 70 71  Resp:  18  Temp: 36.8 C 36.8 C  SpO2:  100%    Last Pain:  Vitals:   09/11/17 0904  TempSrc: Oral  PainSc: 0-No pain                 [name]

## 2017-09-11 NOTE — Transfer of Care (Signed)
Immediate Anesthesia Transfer of Care Note  Patient: Christian Sanders  Procedure(s) Performed: ESOPHAGOGASTRODUODENOSCOPY (EGD) WITH PROPOFOL (N/A )  Patient Location: PACU  Anesthesia Type:MAC  Level of Consciousness: awake and patient cooperative  Airway & Oxygen Therapy: Patient Spontanous Breathing  Post-op Assessment: Report given to RN and Post -op Vital signs reviewed and stable  Post vital signs: Reviewed and stable  Last Vitals:  Vitals Value Taken Time  BP    Temp    Pulse    Resp    SpO2      Last Pain:  Vitals:   09/11/17 0904  TempSrc: Oral  PainSc: 0-No pain         Complications: No apparent anesthesia complications

## 2017-09-11 NOTE — Discharge Instructions (Signed)
You have esophageal varices DUE TO SCARRING IN YOUR LIVER FROM HEPATITIS C AND ALCOHOL. I DID NOT HAVE TO BAND THIS TIME. THE HEP C TREATMENT IS HELPING YOUR LIVER HEAL.     COMPLETE TREATMENT FOR HEPATITIS C.  REPEAT EGD IN SEP 2019.  FOLLOW UP In DEC 2019.     UPPER ENDOSCOPY AFTER CARE Read the instructions outlined below and refer to this sheet in the next week. These discharge instructions provide you with general information on caring for yourself after you leave the hospital. While your treatment has been planned according to the most current medical practices available, unavoidable complications occasionally occur. If you have any problems or questions after discharge, call DR. FIELDS, [phone].  ACTIVITY  You may resume your regular activity, but move at a slower pace for the next 24 hours.   Take frequent rest periods for the next 24 hours.   Walking will help get rid of the air and reduce the bloated feeling in your belly (abdomen).   No driving for 24 hours (because of the medicine (anesthesia) used during the test).   You may shower.   Do not sign any important legal documents or operate any machinery for 24 hours (because of the anesthesia used during the test).    NUTRITION  Drink plenty of fluids.   You may resume your normal diet as instructed by your doctor.   Begin with a light meal and progress to your normal diet. Heavy or fried foods are harder to digest and may make you feel sick to your stomach (nauseated).   Avoid alcoholic beverages for 24 hours or as instructed.    MEDICATIONS  You may resume your normal medications.   WHAT YOU CAN EXPECT TODAY  Some feelings of bloating in the abdomen.   Passage of more gas than usual.    IF YOU HAD A BIOPSY TAKEN DURING THE UPPER ENDOSCOPY:  Eat a soft diet IF YOU HAVE NAUSEA, BLOATING, ABDOMINAL PAIN, OR VOMITING.    FINDING OUT THE RESULTS OF YOUR TEST Not all test results are available  during your visit. DR. Darrick PennaFIELDS WILL CALL YOU WITHIN 7 DAYS OF YOUR PROCEDUE WITH YOUR RESULTS. Do not assume everything is normal if you have not heard from DR. FIELDS IN ONE WEEK, CALL HER OFFICE AT [phone].  SEEK IMMEDIATE MEDICAL ATTENTION AND CALL THE OFFICE: [phone] IF:  You have more than a spotting of blood in your stool.   Your belly is swollen (abdominal distention).   You are nauseated or vomiting.   You have a temperature over 101F.   You have abdominal pain or discomfort that is severe or gets worse throughout the day.

## 2017-09-12 ENCOUNTER — Ambulatory Visit (HOSPITAL_COMMUNITY)
Admission: RE | Admit: 2017-09-12 | Discharge: 2017-09-12 | Disposition: A | Discharge status: Home / Self Care | Payer: Medicare HMO | Source: Ambulatory Visit | Attending: Nurse Practitioner | Admitting: Nurse Practitioner

## 2017-09-12 DIAGNOSIS — K7030 Alcoholic cirrhosis of liver without ascites: Secondary | ICD-10-CM | POA: Diagnosis not present

## 2017-09-12 DIAGNOSIS — K7460 Unspecified cirrhosis of liver: Secondary | ICD-10-CM | POA: Diagnosis not present

## 2017-09-12 DIAGNOSIS — R69 Illness, unspecified: Secondary | ICD-10-CM | POA: Diagnosis not present

## 2017-09-12 DIAGNOSIS — K766 Portal hypertension: Secondary | ICD-10-CM | POA: Diagnosis not present

## 2017-09-12 DIAGNOSIS — F1021 Alcohol dependence, in remission: Secondary | ICD-10-CM | POA: Diagnosis not present

## 2017-09-12 DIAGNOSIS — I8510 Secondary esophageal varices without bleeding: Secondary | ICD-10-CM | POA: Insufficient documentation

## 2017-09-12 DIAGNOSIS — B182 Chronic viral hepatitis C: Secondary | ICD-10-CM | POA: Diagnosis not present

## 2017-09-18 ENCOUNTER — Encounter (HOSPITAL_COMMUNITY): Payer: Self-pay | Admitting: Gastroenterology

## 2017-09-24 DIAGNOSIS — F1021 Alcohol dependence, in remission: Secondary | ICD-10-CM | POA: Diagnosis not present

## 2017-09-24 DIAGNOSIS — B182 Chronic viral hepatitis C: Secondary | ICD-10-CM | POA: Diagnosis not present

## 2017-09-24 DIAGNOSIS — R69 Illness, unspecified: Secondary | ICD-10-CM | POA: Diagnosis not present

## 2017-09-24 DIAGNOSIS — I8510 Secondary esophageal varices without bleeding: Secondary | ICD-10-CM | POA: Diagnosis not present

## 2017-09-24 DIAGNOSIS — K766 Portal hypertension: Secondary | ICD-10-CM | POA: Diagnosis not present

## 2017-09-25 DIAGNOSIS — B182 Chronic viral hepatitis C: Secondary | ICD-10-CM | POA: Diagnosis not present

## 2017-09-25 DIAGNOSIS — R600 Localized edema: Secondary | ICD-10-CM | POA: Diagnosis not present

## 2017-09-25 LAB — COMPREHENSIVE METABOLIC PANEL
AG RATIO: 0.8 (calc) — AB (ref 1.0–2.5)
ALKALINE PHOSPHATASE (APISO): 121 U/L — AB (ref 40–115)
ALT: 16 U/L (ref 9–46)
AST: 26 U/L (ref 10–35)
Albumin: 3 g/dL — ABNORMAL LOW (ref 3.6–5.1)
BUN: 8 mg/dL (ref 7–25)
CHLORIDE: 106 mmol/L (ref 98–110)
CO2: 29 mmol/L (ref 20–32)
Calcium: 8.5 mg/dL — ABNORMAL LOW (ref 8.6–10.3)
Creat: 0.75 mg/dL (ref 0.70–1.33)
GLOBULIN: 3.8 g/dL — AB (ref 1.9–3.7)
Glucose, Bld: 103 mg/dL (ref 65–139)
Potassium: 3.5 mmol/L (ref 3.5–5.3)
Sodium: 139 mmol/L (ref 135–146)
Total Bilirubin: 2.8 mg/dL — ABNORMAL HIGH (ref 0.2–1.2)
Total Protein: 6.8 g/dL (ref 6.1–8.1)

## 2017-09-25 LAB — PROTIME-INR
INR: 1.4 — ABNORMAL HIGH
PROTHROMBIN TIME: 14.9 s — AB (ref 9.0–11.5)

## 2017-09-25 LAB — AFP TUMOR MARKER: AFP TUMOR MARKER: 5.2 ng/mL (ref <6.1)

## 2017-09-27 ENCOUNTER — Ambulatory Visit (INDEPENDENT_AMBULATORY_CARE_PROVIDER_SITE_OTHER): Payer: Medicare HMO | Admitting: Orthopaedic Surgery

## 2017-09-27 ENCOUNTER — Encounter: Payer: Self-pay | Admitting: Orthopaedic Surgery

## 2017-09-27 VITALS — BP 122/71 | HR 75 | Temp 98.2°F | Ht 74.0 in | Wt 233.0 lb

## 2017-09-27 DIAGNOSIS — L03115 Cellulitis of right lower limb: Secondary | ICD-10-CM

## 2017-09-27 DIAGNOSIS — M25512 Pain in left shoulder: Secondary | ICD-10-CM

## 2017-09-27 DIAGNOSIS — G8929 Other chronic pain: Secondary | ICD-10-CM

## 2017-09-27 MED ORDER — HYDROCODONE-ACETAMINOPHEN 7.5-325 MG PO TABS: 1.0000 | ORAL_TABLET | Freq: Four times a day (QID) | ORAL | 0 refills | Status: AC | PRN
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
## 2017-09-27 NOTE — Patient Instructions (Signed)
Steps to Quit Smoking Smoking tobacco can be bad for your health. It can also affect almost every organ in your body. Smoking puts you and people around you at risk for many serious long-lasting (chronic) diseases. Quitting smoking is hard, but it is one of the best things that you can do for your health. It is never too late to quit. What are the benefits of quitting smoking? When you quit smoking, you lower your risk for getting serious diseases and conditions. They can include:  Lung cancer or lung disease.  Heart disease.  Stroke.  Heart attack.  Not being able to have children (infertility).  Weak bones (osteoporosis) and broken bones (fractures).  If you have coughing, wheezing, and shortness of breath, those symptoms may get better when you quit. You may also get sick less often. If you are pregnant, quitting smoking can help to lower your chances of having a baby of low birth weight. What can I do to help me quit smoking? Talk with your doctor about what can help you quit smoking. Some things you can do (strategies) include:  Quitting smoking totally, instead of slowly cutting back how much you smoke over a period of time.  Going to in-person counseling. You are more likely to quit if you go to many counseling sessions.  Using resources and support systems, such as: ? Online chats with a counselor. ? Phone quitlines. ? Printed self-help materials. ? Support groups or group counseling. ? Text messaging programs. ? Mobile phone apps or applications.  Taking medicines. Some of these medicines may have nicotine in them. If you are pregnant or breastfeeding, do not take any medicines to quit smoking unless your doctor says it is okay. Talk with your doctor about counseling or other things that can help you.  Talk with your doctor about using more than one strategy at the same time, such as taking medicines while you are also going to in-person counseling. This can help make  quitting easier. What things can I do to make it easier to quit? Quitting smoking might feel very hard at first, but there is a lot that you can do to make it easier. Take these steps:  Talk to your family and friends. Ask them to support and encourage you.  Call phone quitlines, reach out to support groups, or work with a counselor.  Ask people who smoke to not smoke around you.  Avoid places that make you want (trigger) to smoke, such as: ? Bars. ? Parties. ? Smoke-break areas at work.  Spend time with people who do not smoke.  Lower the stress in your life. Stress can make you want to smoke. Try these things to help your stress: ? Getting regular exercise. ? Deep-breathing exercises. ? Yoga. ? Meditating. ? Doing a body scan. To do this, close your eyes, focus on one area of your body at a time from head to toe, and notice which parts of your body are tense. Try to relax the muscles in those areas.  Download or buy apps on your mobile phone or tablet that can help you stick to your quit plan. There are many free apps, such as QuitGuide from the CDC (Centers for Disease Control and Prevention). You can find more support from smokefree.gov and other websites.  This information is not intended to replace advice given to you by your health care provider. Make sure you discuss any questions you have with your health care provider. Document Released: 01/14/2009 Document   Revised: 11/16/2015 Document Reviewed: 08/04/2014 Elsevier Interactive Patient Education  2018 Elsevier Inc.  

## 2017-10-03 ENCOUNTER — Ambulatory Visit (INDEPENDENT_AMBULATORY_CARE_PROVIDER_SITE_OTHER): Payer: Medicare HMO | Admitting: Orthopaedic Surgery

## 2017-10-03 ENCOUNTER — Encounter: Payer: Self-pay | Admitting: Orthopaedic Surgery

## 2017-10-03 VITALS — BP 124/69 | HR 80 | Temp 97.8°F | Ht 74.0 in | Wt 233.0 lb

## 2017-10-03 DIAGNOSIS — G8929 Other chronic pain: Secondary | ICD-10-CM

## 2017-10-03 DIAGNOSIS — L03115 Cellulitis of right lower limb: Secondary | ICD-10-CM

## 2017-10-03 DIAGNOSIS — M25512 Pain in left shoulder: Secondary | ICD-10-CM

## 2017-10-03 NOTE — Patient Instructions (Signed)
Steps to Quit Smoking Smoking tobacco can be bad for your health. It can also affect almost every organ in your body. Smoking puts you and people around you at risk for many serious long-lasting (chronic) diseases. Quitting smoking is hard, but it is one of the best things that you can do for your health. It is never too late to quit. What are the benefits of quitting smoking? When you quit smoking, you lower your risk for getting serious diseases and conditions. They can include:  Lung cancer or lung disease.  Heart disease.  Stroke.  Heart attack.  Not being able to have children (infertility).  Weak bones (osteoporosis) and broken bones (fractures).  If you have coughing, wheezing, and shortness of breath, those symptoms may get better when you quit. You may also get sick less often. If you are pregnant, quitting smoking can help to lower your chances of having a baby of low birth weight. What can I do to help me quit smoking? Talk with your doctor about what can help you quit smoking. Some things you can do (strategies) include:  Quitting smoking totally, instead of slowly cutting back how much you smoke over a period of time.  Going to in-person counseling. You are more likely to quit if you go to many counseling sessions.  Using resources and support systems, such as: ? Online chats with a counselor. ? Phone quitlines. ? Printed self-help materials. ? Support groups or group counseling. ? Text messaging programs. ? Mobile phone apps or applications.  Taking medicines. Some of these medicines may have nicotine in them. If you are pregnant or breastfeeding, do not take any medicines to quit smoking unless your doctor says it is okay. Talk with your doctor about counseling or other things that can help you.  Talk with your doctor about using more than one strategy at the same time, such as taking medicines while you are also going to in-person counseling. This can help make  quitting easier. What things can I do to make it easier to quit? Quitting smoking might feel very hard at first, but there is a lot that you can do to make it easier. Take these steps:  Talk to your family and friends. Ask them to support and encourage you.  Call phone quitlines, reach out to support groups, or work with a counselor.  Ask people who smoke to not smoke around you.  Avoid places that make you want (trigger) to smoke, such as: ? Bars. ? Parties. ? Smoke-break areas at work.  Spend time with people who do not smoke.  Lower the stress in your life. Stress can make you want to smoke. Try these things to help your stress: ? Getting regular exercise. ? Deep-breathing exercises. ? Yoga. ? Meditating. ? Doing a body scan. To do this, close your eyes, focus on one area of your body at a time from head to toe, and notice which parts of your body are tense. Try to relax the muscles in those areas.  Download or buy apps on your mobile phone or tablet that can help you stick to your quit plan. There are many free apps, such as QuitGuide from the CDC (Centers for Disease Control and Prevention). You can find more support from smokefree.gov and other websites.  This information is not intended to replace advice given to you by your health care provider. Make sure you discuss any questions you have with your health care provider. Document Released: 01/14/2009 Document   Revised: 11/16/2015 Document Reviewed: 08/04/2014 Elsevier Interactive Patient Education  2018 Elsevier Inc.  

## 2017-10-03 NOTE — Progress Notes (Signed)
CC:  My leg is much better  He has had cellulitis of the right lower leg. He has been on doxycycline.  He is much improved. He has no further redness or pain.  NV intact to the right lower leg.  He has no more erythema.  Gait is normal.  Cellulitis is resolved.  Encounter Diagnoses  Name Primary?  . Cellulitis of right lower extremity Yes  . Chronic left shoulder pain    I will see as needed.  Call if any problem.  Precautions discussed.   Electronically Signed Darreld McleanWayne Crystalle Popwell, MD 7/3/20199:09 AM
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
## 2017-10-10 ENCOUNTER — Encounter (HOSPITAL_COMMUNITY): Payer: Self-pay | Admitting: Emergency Medicine

## 2017-10-10 ENCOUNTER — Encounter (HOSPITAL_COMMUNITY): Payer: Self-pay | Admitting: Psychiatry

## 2017-10-10 ENCOUNTER — Emergency Department (HOSPITAL_COMMUNITY)
Admission: EM | Admit: 2017-10-10 | Discharge: 2017-10-10 | Disposition: A | Discharge status: Home / Self Care | Payer: Medicare HMO | Attending: Emergency Medicine | Admitting: Emergency Medicine

## 2017-10-10 ENCOUNTER — Ambulatory Visit (INDEPENDENT_AMBULATORY_CARE_PROVIDER_SITE_OTHER): Payer: Medicare HMO | Admitting: Psychiatry

## 2017-10-10 ENCOUNTER — Other Ambulatory Visit: Payer: Self-pay

## 2017-10-10 ENCOUNTER — Ambulatory Visit (HOSPITAL_COMMUNITY): Payer: Self-pay | Admitting: Psychiatry

## 2017-10-10 VITALS — BP 129/78 | HR 84 | Ht 74.0 in | Wt 236.0 lb

## 2017-10-10 DIAGNOSIS — Z5321 Procedure and treatment not carried out due to patient leaving prior to being seen by health care provider: Secondary | ICD-10-CM | POA: Diagnosis not present

## 2017-10-10 DIAGNOSIS — R609 Edema, unspecified: Secondary | ICD-10-CM | POA: Diagnosis present

## 2017-10-10 DIAGNOSIS — F17210 Nicotine dependence, cigarettes, uncomplicated: Secondary | ICD-10-CM

## 2017-10-10 DIAGNOSIS — F4310 Post-traumatic stress disorder, unspecified: Secondary | ICD-10-CM | POA: Diagnosis not present

## 2017-10-10 DIAGNOSIS — F410 Panic disorder [episodic paroxysmal anxiety] without agoraphobia: Secondary | ICD-10-CM | POA: Diagnosis not present

## 2017-10-10 DIAGNOSIS — F330 Major depressive disorder, recurrent, mild: Secondary | ICD-10-CM | POA: Diagnosis not present

## 2017-10-10 DIAGNOSIS — R69 Illness, unspecified: Secondary | ICD-10-CM | POA: Diagnosis not present

## 2017-10-10 LAB — CBC
HCT: 35.2 % — ABNORMAL LOW (ref 39.0–52.0)
HEMOGLOBIN: 12 g/dL — AB (ref 13.0–17.0)
MCH: 36.1 pg — AB (ref 26.0–34.0)
MCHC: 34.1 g/dL (ref 30.0–36.0)
MCV: 106 fL — ABNORMAL HIGH (ref 78.0–100.0)
PLATELETS: 156 10*3/uL (ref 150–400)
RBC: 3.32 MIL/uL — AB (ref 4.22–5.81)
RDW: 15 % (ref 11.5–15.5)
WBC: 9.1 10*3/uL (ref 4.0–10.5)

## 2017-10-10 LAB — COMPREHENSIVE METABOLIC PANEL
ALBUMIN: 3.3 g/dL — AB (ref 3.5–5.0)
ALK PHOS: 146 U/L — AB (ref 38–126)
ALT: 32 U/L (ref 0–44)
ANION GAP: 7 (ref 5–15)
AST: 50 U/L — ABNORMAL HIGH (ref 15–41)
BUN: 9 mg/dL (ref 6–20)
CHLORIDE: 109 mmol/L (ref 98–111)
CO2: 22 mmol/L (ref 22–32)
Calcium: 9 mg/dL (ref 8.9–10.3)
Creatinine, Ser: 0.83 mg/dL (ref 0.61–1.24)
GFR calc non Af Amer: >60 mL/min (ref >60)
GLUCOSE: 124 mg/dL — AB (ref 70–99)
Potassium: 3.4 mmol/L — ABNORMAL LOW (ref 3.5–5.1)
SODIUM: 138 mmol/L (ref 135–145)
Total Bilirubin: 3.7 mg/dL — ABNORMAL HIGH (ref 0.3–1.2)
Total Protein: 8.2 g/dL — ABNORMAL HIGH (ref 6.5–8.1)

## 2017-10-10 MED ORDER — SERTRALINE HCL 50 MG PO TABS: 50.0000 mg | ORAL_TABLET | Freq: Every day | ORAL | 0 refills | Status: DC

## 2017-10-10 MED ORDER — ALPRAZOLAM 0.5 MG PO TABS: 0.2500 mg | ORAL_TABLET | Freq: Two times a day (BID) | ORAL | 2 refills | Status: DC | PRN

## 2017-10-10 MED ORDER — LURASIDONE HCL 20 MG PO TABS: 20.0000 mg | ORAL_TABLET | Freq: Every day | ORAL | 0 refills | Status: DC

## 2017-10-10 NOTE — ED Triage Notes (Signed)
Bilateral edema feet and legs. Saw Dr Hilda LiasKeeling and finish the antibiotics given. Edema was intermittent and one leg or the other, now both legs and painful.

## 2017-10-10 NOTE — ED Notes (Signed)
Pt advised registration he was leaving 

## 2017-10-10 NOTE — ED Notes (Signed)
Checked on patient in waiting area. NAD noted. Delay explained to patient and family. 

## 2017-10-10 NOTE — Patient Instructions (Signed)
1. Continue sertraline 50 mg daily  2. Continue latuda 20 mg daily  3. Continue Xanax 0.5 mg twice a day as needed for anxiety 4.Return to clinic inthree months for 30 mins

## 2017-10-11 ENCOUNTER — Encounter (HOSPITAL_COMMUNITY): Payer: Self-pay | Admitting: Emergency Medicine

## 2017-10-11 ENCOUNTER — Other Ambulatory Visit: Payer: Self-pay

## 2017-10-11 ENCOUNTER — Emergency Department (HOSPITAL_COMMUNITY)
Admission: EM | Admit: 2017-10-11 | Discharge: 2017-10-11 | Disposition: A | Discharge status: Home / Self Care | Payer: Medicare HMO | Attending: Emergency Medicine | Admitting: Emergency Medicine

## 2017-10-11 DIAGNOSIS — F17210 Nicotine dependence, cigarettes, uncomplicated: Secondary | ICD-10-CM | POA: Diagnosis not present

## 2017-10-11 DIAGNOSIS — L03115 Cellulitis of right lower limb: Secondary | ICD-10-CM | POA: Diagnosis not present

## 2017-10-11 DIAGNOSIS — Z79899 Other long term (current) drug therapy: Secondary | ICD-10-CM | POA: Insufficient documentation

## 2017-10-11 DIAGNOSIS — M79673 Pain in unspecified foot: Secondary | ICD-10-CM | POA: Diagnosis present

## 2017-10-11 DIAGNOSIS — L03116 Cellulitis of left lower limb: Secondary | ICD-10-CM | POA: Diagnosis not present

## 2017-10-11 DIAGNOSIS — I10 Essential (primary) hypertension: Secondary | ICD-10-CM | POA: Diagnosis not present

## 2017-10-11 DIAGNOSIS — L03119 Cellulitis of unspecified part of limb: Secondary | ICD-10-CM

## 2017-10-11 DIAGNOSIS — R69 Illness, unspecified: Secondary | ICD-10-CM | POA: Diagnosis not present

## 2017-10-11 MED ORDER — DOXYCYCLINE HYCLATE 100 MG PO CAPS: 100.0000 mg | ORAL_CAPSULE | Freq: Two times a day (BID) | ORAL | 0 refills | Status: DC

## 2017-10-11 MED ORDER — DOXYCYCLINE HYCLATE 100 MG PO TABS
100.0000 mg | ORAL_TABLET | Freq: Once | ORAL | Status: AC
  Administered 2017-10-11: 100 mg via ORAL
  Filled 2017-10-11: qty 1

## 2017-10-11 MED ORDER — OXYCODONE HCL 5 MG PO TABS: 5.0000 mg | ORAL_TABLET | ORAL | 0 refills | Status: DC | PRN

## 2017-10-11 MED ORDER — OXYCODONE HCL 5 MG PO TABS
5.0000 mg | ORAL_TABLET | Freq: Once | ORAL | Status: AC
  Administered 2017-10-11: 5 mg via ORAL
  Filled 2017-10-11: qty 1

## 2017-10-11 NOTE — Discharge Instructions (Addendum)
Take the entire course of the doxycyline, twice daily.  Elevate and use warm compresses on your feet for comfort and to help heal this infection.  You may take the oxycodone prescribed for pain relief.  This will make you drowsy - do not drive within 4 hours of taking this medication.  Get rechecked by Dr. Hilda LiasKeeling or by returning here for any worsening redness, swelling or pain.

## 2017-10-11 NOTE — ED Notes (Signed)
EDP at bedside  

## 2017-10-11 NOTE — ED Provider Notes (Signed)
[facility] EMERGENCY DEPARTMENT Provider Note   CSN: [id number] Arrival date & time: 10/11/17  5621     History   Chief Complaint Chief Complaint  Patient presents with  . Foot Problem    HPI Christian Sanders is a 57 y.o. male with past medical history as outlined below and a recent diagnosis of cellulitis in his bilateral lower legs and feet presenting with worsening pain and swelling in his feet only.  He was treated with a 10-day course of doxycycline taking 1 tablet daily which he completed 5 days ago.  The pain and swelling of his legs have improved but he has started to develop worsening swelling in his bilateral feet.  He endorses intermittent erythematous patches on his feet which worsen when he is weightbearing.  He denies fevers or chills and has had no wounds to his feet, although recently had some fissures between his toes from athlete's foot which are currently healing.  He presented to the ED yesterday at which time he had blood work drawn, but did not wait for evaluation given the wait time.  He has had no medications for symptoms today.  He has tried to keep his legs elevated as much as possible as weightbearing worsens pain and swelling.   The history is provided by the patient.    Past Medical History:  Diagnosis Date  . Abnormal CT of liver 12/20/2016  . Anxiety   . Bipolar disorder (HCC)   . Dentures complicating chewing    full upper, partial lower  . Depression   . Esophageal varices determined by endoscopy (HCC)   . Gastritis and duodenitis   . GERD (gastroesophageal reflux disease)   . Headache    chronic, s/p facial injury and reconstruction  . Hypertension   . Portal hypertensive gastropathy (HCC)   . PTSD (post-traumatic stress disorder)   . Vertebral column disorder    2 crushed thoracic vertebra    Patient Active Problem List   Diagnosis Date Noted  . Hepatitis C 09/06/2017  . Mild episode of recurrent major depressive disorder (HCC) 06/13/2017    . Panic disorder 06/13/2017  . Alcohol use disorder, severe, in early remission (HCC) 06/13/2017  . Esophageal varices in alcoholic cirrhosis (HCC)   . Depression 05/08/2017  . Varices of esophagus determined by endoscopy (HCC)   . Gastritis and duodenitis   . Splenomegaly 03/08/2017  . Hepatic cirrhosis (HCC) 12/27/2016  . RUQ pain 12/27/2016  . Portal hypertension (HCC) 12/27/2016  . Elevated liver enzymes 03/02/2016  . BPH without urinary obstruction 02/28/2016  . ED (erectile dysfunction) 12/28/2015  . SK (seborrheic keratosis) 12/28/2015  . Chronic headaches 02/09/2015  . Tobacco abuse 02/09/2015  . Hypertension 11/23/2014  . PTSD (post-traumatic stress disorder) 11/23/2014  . Special screening for malignant neoplasms, colon   . Chronic mid back pain 12/16/2013  . Closed compression fracture of thoracic vertebra (HCC) 09/05/2013    Past Surgical History:  Procedure Laterality Date  . BIOPSY  04/17/2017   Procedure: BIOPSY;  Surgeon: West Bali, MD;  [facility];  Service: Endoscopy;;  gastric for h pylori  . COLONOSCOPY WITH PROPOFOL N/A 10/09/2014   Procedure: COLONOSCOPY WITH PROPOFOL;  Surgeon: Midge Minium, MD;  Location: [facility];  Service: Endoscopy;  Laterality: N/A;  . ESOPHAGEAL BANDING N/A 04/17/2017   Procedure: ESOPHAGEAL BANDING;  Surgeon: West Bali, MD;  [facility];  Service: Endoscopy;  Laterality: N/A;  . ESOPHAGEAL BANDING N/A 05/22/2017  Procedure: ESOPHAGEAL BANDING;  Surgeon: West Bali, MD;  [facility];  Service: Endoscopy;  Laterality: N/A;  . ESOPHAGEAL BANDING N/A 06/26/2017   Procedure: ESOPHAGEAL BANDING;  Surgeon: West Bali, MD;  [facility];  Service: Endoscopy;  Laterality: N/A;  . ESOPHAGOGASTRODUODENOSCOPY (EGD) WITH PROPOFOL N/A 04/17/2017   Procedure: ESOPHAGOGASTRODUODENOSCOPY (EGD) WITH PROPOFOL;  Surgeon: West Bali, MD;  [facility];  Service:  Endoscopy;  Laterality: N/A;  10:45am  . ESOPHAGOGASTRODUODENOSCOPY (EGD) WITH PROPOFOL N/A 05/22/2017   Procedure: ESOPHAGOGASTRODUODENOSCOPY (EGD) WITH PROPOFOL;  Surgeon: West Bali, MD;  [facility];  Service: Endoscopy;  Laterality: N/A;  . ESOPHAGOGASTRODUODENOSCOPY (EGD) WITH PROPOFOL N/A 06/26/2017   Procedure: ESOPHAGOGASTRODUODENOSCOPY (EGD) WITH PROPOFOL;  Surgeon: West Bali, MD;  [facility];  Service: Endoscopy;  Laterality: N/A;  1:45pm  . ESOPHAGOGASTRODUODENOSCOPY (EGD) WITH PROPOFOL N/A 09/11/2017   Procedure: ESOPHAGOGASTRODUODENOSCOPY (EGD) WITH PROPOFOL;  Surgeon: West Bali, MD;  [facility];  Service: Endoscopy;  Laterality: N/A;  10:30am  . FACIAL RECONSTRUCTION SURGERY     eye socket with 2 metal plates        Home Medications    Prior to Admission medications   Medication Sig Start Date End Date Taking? Authorizing Provider  ALPRAZolam Prudy Feeler) 0.5 MG tablet Take 0.5-1 tablets (0.25-0.5 mg total) by mouth 2 (two) times daily as needed for anxiety. 10/18/17   Neysa Hotter, MD  amLODipine (NORVASC) 5 MG tablet Take 1 tablet (5 mg total) by mouth daily. 05/08/17   Karamalegos, Netta Neat, DO  doxycycline (VIBRAMYCIN) 100 MG capsule Take 1 capsule (100 mg total) by mouth 2 (two) times daily. 10/11/17   Leilyn Frayre, Raynelle Fanning, PA-C  EPCLUSA 400-100 MG TABS Take 1 tablet by mouth at bedtime.  07/27/17   [provider]  fluticasone (FLONASE) 50 MCG/ACT nasal spray PLACE 2 SPRAYS INTO BOTH NOSTRILS DAILY. USE FOR 4-6 WKS THEN STOP AND USE SEASONALLY OR AS NEEDED. Patient taking differently: PLACE 2 SPRAYS INTO BOTH NOSTRILS DAILY AS NEEDED FOR ALLERGIES. 07/31/17   Karamalegos, Netta Neat, DO  furosemide (LASIX) 20 MG tablet Take 1 tablet (20 mg total) by mouth daily. 09/07/17   Anice Paganini, NP  gabapentin (NEURONTIN) 100 MG capsule Start 1 capsule daily, increase by 1 cap every 2-3 days as tolerated up to 3 times a day, or may take  3 at once in evening. Patient taking differently: Take 100-200 mg by mouth 3 (three) times daily as needed (for pain.).  06/05/17   Karamalegos, Netta Neat, DO  lurasidone (LATUDA) 20 MG TABS tablet Take 1 tablet (20 mg total) by mouth daily. 10/10/17   Neysa Hotter, MD  Melatonin 10 MG TABS Take 30 mg by mouth at bedtime.    [provider]  ondansetron (ZOFRAN-ODT) 4 MG disintegrating tablet Take 4 mg by mouth every 8 (eight) hours as needed (for nausea/vomiting.).  08/14/17   [provider]  oxyCODONE (ROXICODONE) 5 MG immediate release tablet Take 1 tablet (5 mg total) by mouth every 4 (four) hours as needed for severe pain. 10/11/17   Burgess Amor, PA-C  ribavirin (COPEGUS) 200 MG tablet Take 200-400 mg by mouth See admin instructions. Take 2 capsules (400 mg) in the morning & 1 capsule (200 mg) in the evening. 07/27/17   [provider]  sertraline (ZOLOFT) 50 MG tablet Take 1 tablet (50 mg total) by mouth daily. 10/10/17   Neysa Hotter, MD  sildenafil (REVATIO) 20 MG tablet TAKE 1 TO 5 TABLETS BY MOUTH ONCE DAILY AS NEEDED Patient taking differently: Take 20-100 mg by mouth as needed (for ED).  05/08/17   Smitty Cords, DO    Family History Family History  Problem Relation Age of Onset  . Leukemia Father   . Colon cancer Neg Hx   . Liver disease Neg Hx     Social History Social History   Tobacco Use  . Smoking status: Current Every Day Smoker    Packs/day: 0.25    Years: 30.00    Pack years: 7.50    Types: Cigarettes  . Smokeless tobacco: Never Used  Substance Use Topics  . Alcohol use: No    Alcohol/week: 3.6 oz    Types: 6 Cans of beer per week    Frequency: Never    Comment: None as of 03/08/17; previously 1-1.5 cases of beer a day in his 31s.  . Drug use: No     Allergies   Bee venom; Ibuprofen; Pineapple; Pollen extract; and Tylenol [acetaminophen]   Review of Systems Review of Systems  Constitutional: Negative for chills and  fever.  Musculoskeletal: Positive for arthralgias. Negative for joint swelling and myalgias.  Skin: Positive for color change.  Neurological: Negative for weakness and numbness.     Physical Exam Updated Vital Signs BP 114/70   Pulse 86   Temp 98.3 F (36.8 C) (Oral)   Resp 20   Ht 6\' 2"  (1.88 m)   Wt 105.7 kg (233 lb)   SpO2 99%   BMI 29.92 kg/m   Physical Exam  Constitutional: He appears well-developed and well-nourished. No distress.  HENT:  Head: Normocephalic.  Neck: Neck supple.  Cardiovascular: Normal rate.  Pulmonary/Chest: Effort normal. He has no wheezes.  Musculoskeletal: Normal range of motion. He exhibits edema.  Nonpitting edema bilateral dorsal feet.  Patient has nail changes consistent with nail fungus infection.  There is a fissure between his right great toe and second toe, no drainage.  Several other toes appear macerated in the spaces but no obvious infection.  There is mild erythema across the dorsum of his bilateral feet which are equally edematous.  Dorsalis pedis pulses are palpable.  Calves and anterior lower legs are nontender without erythema.  Skin: Skin is warm. No rash noted. There is erythema.     ED Treatments / Results  Labs (all labs ordered are listed, but only abnormal results are displayed) Labs Reviewed - No data to display  EKG None  Radiology No results found.  Procedures Procedures (including critical care time)  Medications Ordered in ED Medications  doxycycline (VIBRA-TABS) tablet 100 mg (100 mg Oral Given 10/11/17 0844)  oxyCODONE (Oxy IR/ROXICODONE) immediate release tablet 5 mg (5 mg Oral Given 10/11/17 0844)     Initial Impression / Assessment and Plan / ED Course  I have reviewed the triage vital signs and the nursing notes.  Pertinent labs & imaging results that were available during my care of the patient were reviewed by me and considered in my medical decision making (see chart for details).     Patient who  has partially responded bilateral lower extremity cellulitis with doxycycline, suspect he will get complete response with twice daily dosing.  He was prescribed this twice daily x10 days.  Discussed elevation and warm compresses.  He was also prescribed a small quantity of oxycodone.  The [facility] database was reviewed prior to this prescription.  Advised PRN follow-up  with Dr. Hilda LiasKeeling or return here for any worsening symptoms.  Final Clinical Impressions(s) / ED Diagnoses   Final diagnoses:  Cellulitis of lower extremity, unspecified laterality    ED Discharge Orders        Ordered    doxycycline (VIBRAMYCIN) 100 MG capsule  2 times daily     10/11/17 0840    oxyCODONE (ROXICODONE) 5 MG immediate release tablet  Every 4 hours PRN     10/11/17 0840       Burgess Amordol, Niomie Englert, PA-C 10/11/17 [id number]    Raeford RazorKohut, Stephen, MD 10/12/17 [phone]

## 2017-10-11 NOTE — ED Triage Notes (Addendum)
Pt reports bilateral edema in feet x 2-3 months. Pt seen on 7/10 for same complaint. States he followed up with Dr. Hilda LiasKeeling and started on antibiotics, but they don't seem to be working. Pt unsure of source of infection. Pt LWBS yesterday due to long wait time. States he had blood work drawn prior to leaving.

## 2017-10-22 ENCOUNTER — Observation Stay (HOSPITAL_COMMUNITY): Payer: Medicare HMO

## 2017-10-22 ENCOUNTER — Observation Stay (HOSPITAL_COMMUNITY)
Admission: EM | Admit: 2017-10-22 | Discharge: 2017-10-23 | Disposition: A | Discharge status: Home / Self Care | Payer: Medicare HMO | Attending: Internal Medicine | Admitting: Internal Medicine

## 2017-10-22 ENCOUNTER — Emergency Department (HOSPITAL_COMMUNITY): Payer: Medicare HMO

## 2017-10-22 ENCOUNTER — Encounter (HOSPITAL_COMMUNITY): Payer: Self-pay | Admitting: Emergency Medicine

## 2017-10-22 ENCOUNTER — Other Ambulatory Visit: Payer: Self-pay

## 2017-10-22 DIAGNOSIS — Z79899 Other long term (current) drug therapy: Secondary | ICD-10-CM | POA: Insufficient documentation

## 2017-10-22 DIAGNOSIS — K7031 Alcoholic cirrhosis of liver with ascites: Secondary | ICD-10-CM | POA: Insufficient documentation

## 2017-10-22 DIAGNOSIS — K921 Melena: Secondary | ICD-10-CM | POA: Diagnosis not present

## 2017-10-22 DIAGNOSIS — F17210 Nicotine dependence, cigarettes, uncomplicated: Secondary | ICD-10-CM | POA: Insufficient documentation

## 2017-10-22 DIAGNOSIS — D689 Coagulation defect, unspecified: Secondary | ICD-10-CM | POA: Diagnosis not present

## 2017-10-22 DIAGNOSIS — Z87898 Personal history of other specified conditions: Secondary | ICD-10-CM

## 2017-10-22 DIAGNOSIS — K766 Portal hypertension: Secondary | ICD-10-CM | POA: Diagnosis not present

## 2017-10-22 DIAGNOSIS — D649 Anemia, unspecified: Secondary | ICD-10-CM

## 2017-10-22 DIAGNOSIS — R109 Unspecified abdominal pain: Secondary | ICD-10-CM | POA: Diagnosis not present

## 2017-10-22 DIAGNOSIS — K922 Gastrointestinal hemorrhage, unspecified: Secondary | ICD-10-CM | POA: Diagnosis not present

## 2017-10-22 DIAGNOSIS — Z720 Tobacco use: Secondary | ICD-10-CM | POA: Diagnosis not present

## 2017-10-22 DIAGNOSIS — D696 Thrombocytopenia, unspecified: Secondary | ICD-10-CM | POA: Diagnosis not present

## 2017-10-22 DIAGNOSIS — I10 Essential (primary) hypertension: Secondary | ICD-10-CM | POA: Insufficient documentation

## 2017-10-22 DIAGNOSIS — F419 Anxiety disorder, unspecified: Secondary | ICD-10-CM | POA: Insufficient documentation

## 2017-10-22 DIAGNOSIS — B182 Chronic viral hepatitis C: Secondary | ICD-10-CM

## 2017-10-22 DIAGNOSIS — M79606 Pain in leg, unspecified: Secondary | ICD-10-CM

## 2017-10-22 DIAGNOSIS — K7469 Other cirrhosis of liver: Secondary | ICD-10-CM | POA: Diagnosis not present

## 2017-10-22 DIAGNOSIS — R1030 Lower abdominal pain, unspecified: Secondary | ICD-10-CM | POA: Diagnosis not present

## 2017-10-22 DIAGNOSIS — K625 Hemorrhage of anus and rectum: Secondary | ICD-10-CM | POA: Diagnosis present

## 2017-10-22 DIAGNOSIS — F1011 Alcohol abuse, in remission: Secondary | ICD-10-CM

## 2017-10-22 DIAGNOSIS — R69 Illness, unspecified: Secondary | ICD-10-CM | POA: Diagnosis not present

## 2017-10-22 DIAGNOSIS — F319 Bipolar disorder, unspecified: Secondary | ICD-10-CM | POA: Diagnosis not present

## 2017-10-22 DIAGNOSIS — R600 Localized edema: Secondary | ICD-10-CM | POA: Diagnosis not present

## 2017-10-22 LAB — COMPREHENSIVE METABOLIC PANEL
ALK PHOS: 154 U/L — AB (ref 38–126)
ALT: 25 U/L (ref 0–44)
ANION GAP: 6 (ref 5–15)
AST: 41 U/L (ref 15–41)
Albumin: 3 g/dL — ABNORMAL LOW (ref 3.5–5.0)
BUN: 8 mg/dL (ref 6–20)
CO2: 25 mmol/L (ref 22–32)
Calcium: 8.8 mg/dL — ABNORMAL LOW (ref 8.9–10.3)
Chloride: 112 mmol/L — ABNORMAL HIGH (ref 98–111)
Creatinine, Ser: 0.84 mg/dL (ref 0.61–1.24)
GFR calc Af Amer: >60 mL/min (ref >60)
GFR calc non Af Amer: >60 mL/min (ref >60)
Glucose, Bld: 103 mg/dL — ABNORMAL HIGH (ref 70–99)
Potassium: 3.5 mmol/L (ref 3.5–5.1)
Sodium: 143 mmol/L (ref 135–145)
Total Bilirubin: 3.1 mg/dL — ABNORMAL HIGH (ref 0.3–1.2)
Total Protein: 7.5 g/dL (ref 6.5–8.1)

## 2017-10-22 LAB — URINALYSIS, ROUTINE W REFLEX MICROSCOPIC
BACTERIA UA: NONE SEEN
Bilirubin Urine: NEGATIVE
GLUCOSE, UA: NEGATIVE mg/dL
KETONES UR: NEGATIVE mg/dL
LEUKOCYTES UA: NEGATIVE
Nitrite: NEGATIVE
PROTEIN: NEGATIVE mg/dL
Specific Gravity, Urine: 1.005 (ref 1.005–1.030)
pH: 8 (ref 5.0–8.0)

## 2017-10-22 LAB — POC OCCULT BLOOD, ED: Fecal Occult Bld: POSITIVE — AB

## 2017-10-22 LAB — PROTIME-INR
INR: 1.82
Prothrombin Time: 21 seconds — ABNORMAL HIGH (ref 11.4–15.2)

## 2017-10-22 LAB — CBC
HCT: 35.3 % — ABNORMAL LOW (ref 39.0–52.0)
Hemoglobin: 11.8 g/dL — ABNORMAL LOW (ref 13.0–17.0)
MCH: 35.5 pg — ABNORMAL HIGH (ref 26.0–34.0)
MCHC: 33.4 g/dL (ref 30.0–36.0)
MCV: 106.3 fL — ABNORMAL HIGH (ref 78.0–100.0)
Platelets: 117 10*3/uL — ABNORMAL LOW (ref 150–400)
RBC: 3.32 MIL/uL — ABNORMAL LOW (ref 4.22–5.81)
RDW: 14.7 % (ref 11.5–15.5)
WBC: 7.4 10*3/uL (ref 4.0–10.5)

## 2017-10-22 LAB — TYPE AND SCREEN
ABO/RH(D): B POS
Antibody Screen: NEGATIVE

## 2017-10-22 MED ORDER — ONDANSETRON HCL 4 MG PO TABS: 4.0000 mg | ORAL_TABLET | Freq: Four times a day (QID) | ORAL | Status: DC | PRN

## 2017-10-22 MED ORDER — ENSURE PO LIQD: 237.0000 mL | Freq: Three times a day (TID) | ORAL | Status: DC

## 2017-10-22 MED ORDER — HYDROMORPHONE HCL 1 MG/ML IJ SOLN
1.0000 mg | Freq: Once | INTRAMUSCULAR | Status: AC
  Administered 2017-10-22: 1 mg via INTRAVENOUS
  Filled 2017-10-22: qty 1

## 2017-10-22 MED ORDER — SODIUM CHLORIDE 0.9 % IV BOLUS
1000.0000 mL | Freq: Once | INTRAVENOUS | Status: AC
  Administered 2017-10-22: 1000 mL via INTRAVENOUS

## 2017-10-22 MED ORDER — HYDROMORPHONE HCL 1 MG/ML PO LIQD: 1.0000 mg | Freq: Four times a day (QID) | ORAL | Status: DC | PRN

## 2017-10-22 MED ORDER — PHYTONADIONE 5 MG PO TABS
5.0000 mg | ORAL_TABLET | Freq: Once | ORAL | Status: AC
  Administered 2017-10-22: 5 mg via ORAL
  Filled 2017-10-22 (×2): qty 1

## 2017-10-22 MED ORDER — OXYCODONE HCL 5 MG PO TABS
5.0000 mg | ORAL_TABLET | ORAL | Status: DC | PRN
  Administered 2017-10-22 – 2017-10-23 (×3): 5 mg via ORAL
  Filled 2017-10-22 (×3): qty 1

## 2017-10-22 MED ORDER — IOHEXOL 300 MG/ML  SOLN
100.0000 mL | Freq: Once | INTRAMUSCULAR | Status: AC | PRN
  Administered 2017-10-22: 100 mL via INTRAVENOUS

## 2017-10-22 MED ORDER — HYDROMORPHONE HCL 1 MG/ML IJ SOLN
1.0000 mg | Freq: Four times a day (QID) | INTRAMUSCULAR | Status: DC | PRN
  Administered 2017-10-22 – 2017-10-23 (×3): 1 mg via INTRAVENOUS
  Filled 2017-10-22 (×3): qty 1

## 2017-10-22 MED ORDER — ALPRAZOLAM 0.25 MG PO TABS
0.2500 mg | ORAL_TABLET | Freq: Two times a day (BID) | ORAL | Status: DC | PRN
  Administered 2017-10-22: 0.5 mg via ORAL
  Filled 2017-10-22: qty 2

## 2017-10-22 MED ORDER — LURASIDONE HCL 20 MG PO TABS
20.0000 mg | ORAL_TABLET | Freq: Every day | ORAL | Status: DC
  Administered 2017-10-22 – 2017-10-23 (×2): 20 mg via ORAL
  Filled 2017-10-22 (×3): qty 1

## 2017-10-22 MED ORDER — ACETAMINOPHEN 650 MG RE SUPP: 650.0000 mg | Freq: Four times a day (QID) | RECTAL | Status: DC | PRN

## 2017-10-22 MED ORDER — GABAPENTIN 100 MG PO CAPS: 100.0000 mg | ORAL_CAPSULE | Freq: Three times a day (TID) | ORAL | Status: DC | PRN

## 2017-10-22 MED ORDER — MELATONIN 3 MG PO TABS
9.0000 mg | ORAL_TABLET | Freq: Every day | ORAL | Status: DC
  Administered 2017-10-22: 9 mg via ORAL
  Filled 2017-10-22 (×2): qty 3

## 2017-10-22 MED ORDER — SERTRALINE HCL 50 MG PO TABS
50.0000 mg | ORAL_TABLET | Freq: Every day | ORAL | Status: DC
  Administered 2017-10-22 – 2017-10-23 (×2): 50 mg via ORAL
  Filled 2017-10-22 (×2): qty 1

## 2017-10-22 MED ORDER — ONDANSETRON HCL 4 MG/2ML IJ SOLN: 4.0000 mg | Freq: Four times a day (QID) | INTRAMUSCULAR | Status: DC | PRN

## 2017-10-22 MED ORDER — AMLODIPINE BESYLATE 5 MG PO TABS
5.0000 mg | ORAL_TABLET | Freq: Every day | ORAL | Status: DC
  Administered 2017-10-22 – 2017-10-23 (×2): 5 mg via ORAL
  Filled 2017-10-22 (×2): qty 1

## 2017-10-22 MED ORDER — ACETAMINOPHEN 325 MG PO TABS: 650.0000 mg | ORAL_TABLET | Freq: Four times a day (QID) | ORAL | Status: DC | PRN

## 2017-10-22 MED ORDER — ENSURE ENLIVE PO LIQD
237.0000 mL | Freq: Three times a day (TID) | ORAL | Status: DC
  Administered 2017-10-22 – 2017-10-23 (×3): 237 mL via ORAL

## 2017-10-22 MED ORDER — FUROSEMIDE 20 MG PO TABS
20.0000 mg | ORAL_TABLET | Freq: Every day | ORAL | Status: DC
  Administered 2017-10-22: 20 mg via ORAL
  Filled 2017-10-22: qty 1

## 2017-10-22 NOTE — ED Triage Notes (Signed)
Pt reports dark red rectal bleeding starting yesterday with abd pain.

## 2017-10-22 NOTE — ED Notes (Signed)
Patient transported to CT 

## 2017-10-22 NOTE — H&P (Signed)
,      History and Physical  Seng Larch ZOX:[id number] DOB: 05-30-1960 DOA: 10/22/2017   PCP: Smitty Cords, DO   Patient coming from: Home  Chief Complaint: hematochezia  HPI:  Christian Sanders is a 57 y.o. male with medical history of liver cirrhosis secondary to alcohol and hepatitis C, hypertension, bipolar disorder presenting with 1 day history of hematochezia that began on 10/21/2017.  The patient stated that he had 5 small bowel movements with some maroon type stools on 10/21/2017 with associated right lower quadrant abdominal cramping.  He denies eating any raw or undercooked foods.  He denies any recent travels or sick contacts.  He denies any fevers, chills, chest pain, shortness breath, nausea, vomiting, hematemesis.  He denies taking any NSAIDs.  The patient states that he recently finished a two-week course of doxycycline for lower extremity cellulitis.  He continues to smoke 3 to 4 cigarettes a day with a history of 40 pack years.  Because of worsening abdominal pain, the patient presented to emergency department for further evaluation.  He complains of some dizziness without any syncope.  In the emergency department, the patient was afebrile hemodynamically stable saturating 100% on room air.  BMP, LFTs, and CBC were essentially unremarkable except with normal cytopenia 117,000.  INR was 1.82.  CT of abdomen and pelvis showed a nodular liver with mild ascites and diverticulosis.  There was some mild increase in mesenteric edema.  Urinalysis was negative for pyuria.  Assessment/Plan: Hematochezia -Concerned about ischemic colitis -hemorrhoidal vs diverticular bleed also a possible -clear liquids -GI consult -09/11/2017 EGD nonbleeding grade 1 and 2 esophageal varices, moderate portal hypertensive gastropathy, erythematous to adenopathy. -start PPI   Liver cirrhosis with ascites -Secondary to alcohol and chronic hepatitis C -Appears compensated presently -Continue  furosemide  Chronic hepatitis C -Recently finished Epclusa and ribavirn  Bipolar disorder -Patient states that he only intermittently takes Zoloft and [place]  Essential hypertension -Continue amlodipine  Lower extremity pain and edema -Venous duplex        Past Medical History:  Diagnosis Date  . Abnormal CT of liver 12/20/2016  . Anxiety   . Bipolar disorder (HCC)   . Dentures complicating chewing    full upper, partial lower  . Depression   . Esophageal varices determined by endoscopy (HCC)   . Gastritis and duodenitis   . GERD (gastroesophageal reflux disease)   . Headache    chronic, s/p facial injury and reconstruction  . Hypertension   . Portal hypertensive gastropathy (HCC)   . PTSD (post-traumatic stress disorder)   . Vertebral column disorder    2 crushed thoracic vertebra   Past Surgical History:  Procedure Laterality Date  . BIOPSY  04/17/2017   Procedure: BIOPSY;  Surgeon: West Bali, MD;  [facility];  Service: Endoscopy;;  gastric for h pylori  . COLONOSCOPY WITH PROPOFOL N/A 10/09/2014   Procedure: COLONOSCOPY WITH PROPOFOL;  Surgeon: Midge Minium, MD;  Location: [facility];  Service: Endoscopy;  Laterality: N/A;  . ESOPHAGEAL BANDING N/A 04/17/2017   Procedure: ESOPHAGEAL BANDING;  Surgeon: West Bali, MD;  [facility];  Service: Endoscopy;  Laterality: N/A;  . ESOPHAGEAL BANDING N/A 05/22/2017   Procedure: ESOPHAGEAL BANDING;  Surgeon: West Bali, MD;  [facility];  Service: Endoscopy;  Laterality: N/A;  . ESOPHAGEAL BANDING N/A 06/26/2017   Procedure: ESOPHAGEAL BANDING;  Surgeon: West Bali, MD;  [facility];  Service:  Endoscopy;  Laterality: N/A;  . ESOPHAGOGASTRODUODENOSCOPY (EGD) WITH PROPOFOL N/A 04/17/2017   Procedure: ESOPHAGOGASTRODUODENOSCOPY (EGD) WITH PROPOFOL;  Surgeon: West BaliFields, Sandi L, MD;  [facility];  Service: Endoscopy;  Laterality: N/A;  10:45am  .  ESOPHAGOGASTRODUODENOSCOPY (EGD) WITH PROPOFOL N/A 05/22/2017   Procedure: ESOPHAGOGASTRODUODENOSCOPY (EGD) WITH PROPOFOL;  Surgeon: West BaliFields, Sandi L, MD;  [facility];  Service: Endoscopy;  Laterality: N/A;  . ESOPHAGOGASTRODUODENOSCOPY (EGD) WITH PROPOFOL N/A 06/26/2017   Procedure: ESOPHAGOGASTRODUODENOSCOPY (EGD) WITH PROPOFOL;  Surgeon: West BaliFields, Sandi L, MD;  [facility];  Service: Endoscopy;  Laterality: N/A;  1:45pm  . ESOPHAGOGASTRODUODENOSCOPY (EGD) WITH PROPOFOL N/A 09/11/2017   Procedure: ESOPHAGOGASTRODUODENOSCOPY (EGD) WITH PROPOFOL;  Surgeon: West BaliFields, Sandi L, MD;  [facility];  Service: Endoscopy;  Laterality: N/A;  10:30am  . FACIAL RECONSTRUCTION SURGERY     eye socket with 2 metal plates   Social History:  reports that he has been smoking cigarettes.  He has a 7.50 pack-year smoking history. He has never used smokeless tobacco. He reports that he does not drink alcohol or use drugs.   Family History  Problem Relation Age of Onset  . Leukemia Father   . Colon cancer Neg Hx   . Liver disease Neg Hx      Allergies  Allergen Reactions  . Bee Venom Swelling and Other (See Comments)    Throat and Tongue   . Ibuprofen Other (See Comments)    Due to liver  . Pineapple Nausea And Vomiting  . Pollen Extract Other (See Comments)    Runny nose, itchy, watery eyes, congestion  . Tylenol [Acetaminophen] Other (See Comments)    Due to liver     Prior to Admission medications   Medication Sig Start Date End Date Taking? Authorizing Provider  ALPRAZolam Prudy Feeler(XANAX) 0.5 MG tablet Take 0.5-1 tablets (0.25-0.5 mg total) by mouth 2 (two) times daily as needed for anxiety. 10/18/17   Neysa HotterHisada, Reina, MD  amLODipine (NORVASC) 5 MG tablet Take 1 tablet (5 mg total) by mouth daily. 05/08/17   Karamalegos, Alexander J, DO  EPCLUSA 400-100 MG TABS Take 1 tablet by mouth at bedtime.  07/27/17   [provider]  fluticasone (FLONASE) 50 MCG/ACT nasal spray  PLACE 2 SPRAYS INTO BOTH NOSTRILS DAILY. USE FOR 4-6 WKS THEN STOP AND USE SEASONALLY OR AS NEEDED. Patient taking differently: PLACE 2 SPRAYS INTO BOTH NOSTRILS DAILY AS NEEDED FOR ALLERGIES. 07/31/17   Karamalegos, Netta NeatAlexander J, DO  furosemide (LASIX) 20 MG tablet Take 1 tablet (20 mg total) by mouth daily. 09/07/17   Anice PaganiniGill, Eric A, NP  gabapentin (NEURONTIN) 100 MG capsule Start 1 capsule daily, increase by 1 cap every 2-3 days as tolerated up to 3 times a day, or may take 3 at once in evening. Patient taking differently: Take 100-200 mg by mouth 3 (three) times daily as needed (for pain.).  06/05/17   Karamalegos, Netta NeatAlexander J, DO  lurasidone (LATUDA) 20 MG TABS tablet Take 1 tablet (20 mg total) by mouth daily. 10/10/17   Neysa HotterHisada, Reina, MD  Melatonin 10 MG TABS Take 30 mg by mouth at bedtime.    [provider]  ondansetron (ZOFRAN-ODT) 4 MG disintegrating tablet Take 4 mg by mouth every 8 (eight) hours as needed (for nausea/vomiting.).  08/14/17   [provider]  ribavirin (COPEGUS) 200 MG tablet Take 200-400 mg by mouth See admin instructions. Take 2 capsules (400 mg) in the morning & 1 capsule (200 mg)  in the evening. 07/27/17   [provider]  sertraline (ZOLOFT) 50 MG tablet Take 1 tablet (50 mg total) by mouth daily. 10/10/17   Neysa Hotter, MD  sildenafil (REVATIO) 20 MG tablet TAKE 1 TO 5 TABLETS BY MOUTH ONCE DAILY AS NEEDED Patient taking differently: Take 20-100 mg by mouth as needed (for ED).  05/08/17   Smitty Cords, DO    Review of Systems:  Constitutional:  No weight loss, night sweats, Fevers, chills, fatigue.  Head&Eyes: No headache.  No vision loss.  No eye pain or scotoma ENT:  No Difficulty swallowing,Tooth/dental problems,Sore throat,  No ear ache, Cardio-vascular:  No chest pain, Orthopnea, PND, swelling in lower extremities,   palpitations  GI:  No  abdominal pain, nausea, vomiting, diarrhea, loss of appetite, hematochezia, melena,  heartburn, indigestion, Resp:  No shortness of breath with exertion or at rest. No cough. No coughing up of blood .No wheezing.No chest wall deformity  Skin:  no rash or lesions.  GU:  no dysuria, change in color of urine, no urgency or frequency. No flank pain.  Musculoskeletal:  No joint pain or swelling. No decreased range of motion. No back pain.  Psych:  No change in mood or affect.  Neurologic: No headache, no dysesthesia, no focal weakness, no vision loss. No syncope  Physical Exam: Vitals:   10/22/17 0800 10/22/17 0902 10/22/17 0930 10/22/17 1000  BP: 125/81 127/67 130/70 138/78  Pulse: 84 79 71 79  Resp: 16 16 18 16   Temp:      TempSrc:      SpO2: 100% 98% 99% 96%  Weight:      Height:       General:  A&O x 3, NAD, nontoxic, pleasant/cooperative Head/Eye: No conjunctival hemorrhage, no icterus, [place]/AT, No nystagmus ENT:  No icterus,  No thrush, good dentition, no pharyngeal exudate Neck:  No masses, no lymphadenpathy, no bruits CV:  RRR, no rub, no gallop, no S3 Lung:  CTAB, good air movement, no wheeze, no rhonchi Abdomen: soft/NT, +BS, nondistended, no peritoneal signs Ext: No cyanosis, No rashes, No petechiae, No lymphangitis, 1 + LE edema Neuro: CNII-XII intact, strength 4/5 in bilateral upper and lower extremities, no dysmetria  Labs on Admission:  Basic Metabolic Panel: Recent Labs  Lab 10/22/17 0834  NA 143  K 3.5  CL 112*  CO2 25  GLUCOSE 103*  BUN 8  CREATININE 0.84  CALCIUM 8.8*   Liver Function Tests: Recent Labs  Lab 10/22/17 0834  AST 41  ALT 25  ALKPHOS 154*  BILITOT 3.1*  PROT 7.5  ALBUMIN 3.0*   No results for input(s): LIPASE, AMYLASE in the last 168 hours. No results for input(s): AMMONIA in the last 168 hours. CBC: Recent Labs  Lab 10/22/17 0834  WBC 7.4  HGB 11.8*  HCT 35.3*  MCV 106.3*  PLT 117*   Coagulation Profile: Recent Labs  Lab 10/22/17 0834  INR 1.82   Cardiac Enzymes: No results for input(s):  CKTOTAL, CKMB, CKMBINDEX, TROPONINI in the last 168 hours. BNP: Invalid input(s): POCBNP CBG: No results for input(s): GLUCAP in the last 168 hours. Urine analysis:    Component Value Date/Time   COLORURINE YELLOW 10/22/2017 0752   APPEARANCEUR HAZY (A) 10/22/2017 0752   LABSPEC 1.005 10/22/2017 0752   PHURINE 8.0 10/22/2017 0752   GLUCOSEU NEGATIVE 10/22/2017 0752   HGBUR SMALL (A) 10/22/2017 0752   BILIRUBINUR NEGATIVE 10/22/2017 0752   KETONESUR NEGATIVE 10/22/2017 0752   PROTEINUR NEGATIVE  10/22/2017 0752   NITRITE NEGATIVE 10/22/2017 0752   LEUKOCYTESUR NEGATIVE 10/22/2017 0752   Sepsis Labs: @LABRCNTIP (procalcitonin:4,lacticidven:4) )No results found for this or any previous visit (from the past 240 hour(s)).   Radiological Exams on Admission: Ct Abdomen Pelvis W Contrast  Result Date: 10/22/2017 CLINICAL DATA:  Rectal bleeding for 2 days with abdominal pain EXAM: CT ABDOMEN AND PELVIS WITH CONTRAST TECHNIQUE: Multidetector CT imaging of the abdomen and pelvis was performed using the standard protocol following bolus administration of intravenous contrast. CONTRAST:  OMNIPAQUE IOHEXOL 300 MG/ML  SOLN COMPARISON:  09/12/2017 ultrasound of the abdomen, 05/02/2017 CT of the abdomen and pelvis. FINDINGS: Lower chest: No acute abnormality. Hepatobiliary: The liver is again somewhat nodular in appearance without focal mass lesion. Portal vein appears patent. Mild ascites is identified. The gallbladder is decompressed with evidence of a tiny gallstone within. Pancreas: Pancreas is well visualized without focal mass lesion. No ductal dilatation is seen. Spleen: Spleen is within normal limits. Adrenals/Urinary Tract: Adrenal glands are unremarkable. The kidneys are well visualize without renal calculi or obstructive change. The bladder is partially distended. Stomach/Bowel: Scattered diverticular change of the colon is noted without definitive diverticulitis. The appendix is within  normal limits. No small bowel abnormality is seen. Vascular/Lymphatic: Atherosclerotic changes of the abdominal aorta are noted without aneurysmal dilatation. No venous abnormality is seen. Reproductive: Prostate is within normal limits. Other: Some increase in edematous change within the right pericolic gutter and upper abdomen as well as the perihepatic fat is noted. These changes have increased when compared with the prior exam and are likely related to some progressive portal hypertension in the underlying ascites. Musculoskeletal: Degenerative changes of lumbar spine are noted. IMPRESSION: Changes consistent with cirrhosis of the liver with some progression in the degree of portal hypertension with mild ascites and increasing mesenteric edema. Contracted gallbladder with small gallstone within. Diverticulosis without diverticulitis. Electronically Signed   By: Alcide Clever M.D.   On: 10/22/2017 10:35        Time spent:60 minutes Code Status:  FULL Family Communication:  Significant other updated at bedside 7/22 Disposition Plan: expect 1-2 day hospitalization Consults called: [facility] DVT Prophylaxis: SCDs  Catarina Hartshorn, DO  [facility] Pager [phone]  If 7PM-7AM, please contact night-coverage [email or web address] Password [facility] 10/22/2017, 11:43 AM

## 2017-10-22 NOTE — ED Notes (Signed)
Pt not able to obtain stool sample at this time.

## 2017-10-22 NOTE — Consult Note (Signed)
Referring Provider: No ref. provider found Primary Care Physician:  Smitty Cords, DO Primary Gastroenterologist:  Dr. Darrick Penna  Date of Admission: 10/22/17 Date of Consultation: 10/22/17  Reason for Consultation:  Hematochezia, cirrhosis  HPI:  Christian Sanders is a 57 y.o. male with a past medical history of multifactorial cirrhosis due to ETOH and Hepatitis C, hypertension, Bipolar, esophageal varices (undergoing band ligation therapy), gastritis/duodenitis, portal hypertension.   Has seen Liver clinic for Hepatitis C treatment and just finished Epclusa/Ribavirin yesterday; has follow-up appointment upcoming.  Reviewed hosptialist note. He noted hematochezia for 1 day, 5 stools with maroon blood associated with right lower abdominal cramping. Denied sick contacts, recent travel, or raw food consumption. Denied fever, diarrhea, N/V, hematemesis. Was recently on doxycycline for cellulitis. Chronic smoker. He noted dizziness on standing in the ER but no syncope.  dHe was stable in ER, noted BMP, CBC, LFTs essentially unremarkable other then plt 117,000. INR increased to 1.82. CT of the abdomen/pelvis noted nodular liver with mild ascites and diverticulosis, mild increase in mesenteric edema. Vasculature only noted aortic atherosclerosis.  Hospitalist concern noted ? ischemic colitis. Possible hemorrhoidal bleeding.  Hgb normal prior to 2019. Since January 2019 hgb has ranged from 11.3-12.6. Today his hgb in the ER was 11.8. BUN normal  Today he states he began having hematochezia yesterday, has ahd about 5 episodes. Typically solid stools, sometimes softer with noted blood but then solidifies. Had blood in his underwear this morning. Also with abdominal pain lower abdomen which also started yesterday, dull, constant. Appetite is good. No worsening pain postprandially. Denies fever, chills, unintentional weight loss, food phobia. Denies any bowel movement of hematochezia since presentation  at the hospital. Denies chest pain, dyspnea, dizziness, lightheadedness, syncope, near syncope. No other GI symptoms.  Past Medical History:  Diagnosis Date  . Abnormal CT of liver 12/20/2016  . Anxiety   . Bipolar disorder (HCC)   . Dentures complicating chewing    full upper, partial lower  . Depression   . Esophageal varices determined by endoscopy (HCC)   . Gastritis and duodenitis   . GERD (gastroesophageal reflux disease)   . Headache    chronic, s/p facial injury and reconstruction  . Hypertension   . Portal hypertensive gastropathy (HCC)   . PTSD (post-traumatic stress disorder)   . Vertebral column disorder    2 crushed thoracic vertebra    Past Surgical History:  Procedure Laterality Date  . BIOPSY  04/17/2017   Procedure: BIOPSY;  Surgeon: West Bali, MD;  Location: [facility];  Service: Endoscopy;;  gastric for h pylori  . COLONOSCOPY WITH PROPOFOL N/A 10/09/2014   Procedure: COLONOSCOPY WITH PROPOFOL;  Surgeon: Midge Minium, MD;  Location: [facility];  Service: Endoscopy;  Laterality: N/A;  . ESOPHAGEAL BANDING N/A 04/17/2017   Procedure: ESOPHAGEAL BANDING;  Surgeon: West Bali, MD;  Location: [facility];  Service: Endoscopy;  Laterality: N/A;  . ESOPHAGEAL BANDING N/A 05/22/2017   Procedure: ESOPHAGEAL BANDING;  Surgeon: West Bali, MD;  Location: [facility];  Service: Endoscopy;  Laterality: N/A;  . ESOPHAGEAL BANDING N/A 06/26/2017   Procedure: ESOPHAGEAL BANDING;  Surgeon: West Bali, MD;  Location: [facility];  Service: Endoscopy;  Laterality: N/A;  . ESOPHAGOGASTRODUODENOSCOPY (EGD) WITH PROPOFOL N/A 04/17/2017   Procedure: ESOPHAGOGASTRODUODENOSCOPY (EGD) WITH PROPOFOL;  Surgeon: West Bali, MD;  Location: [facility];  Service: Endoscopy;  Laterality: N/A;  10:45am  . ESOPHAGOGASTRODUODENOSCOPY (EGD) WITH PROPOFOL N/A 05/22/2017  Procedure: ESOPHAGOGASTRODUODENOSCOPY (EGD) WITH PROPOFOL;  Surgeon: West Bali, MD;   Location: [facility];  Service: Endoscopy;  Laterality: N/A;  . ESOPHAGOGASTRODUODENOSCOPY (EGD) WITH PROPOFOL N/A 06/26/2017   Procedure: ESOPHAGOGASTRODUODENOSCOPY (EGD) WITH PROPOFOL;  Surgeon: West Bali, MD;  Location: [facility];  Service: Endoscopy;  Laterality: N/A;  1:45pm  . ESOPHAGOGASTRODUODENOSCOPY (EGD) WITH PROPOFOL N/A 09/11/2017   Procedure: ESOPHAGOGASTRODUODENOSCOPY (EGD) WITH PROPOFOL;  Surgeon: West Bali, MD;  Location: [facility];  Service: Endoscopy;  Laterality: N/A;  10:30am  . FACIAL RECONSTRUCTION SURGERY     eye socket with 2 metal plates    Prior to Admission medications   Medication Sig Start Date End Date Taking? Authorizing Provider  ALPRAZolam Prudy Feeler) 0.5 MG tablet Take 0.5-1 tablets (0.25-0.5 mg total) by mouth 2 (two) times daily as needed for anxiety. 10/18/17  Yes Hisada, Barbee Cough, MD  amLODipine (NORVASC) 5 MG tablet Take 1 tablet (5 mg total) by mouth daily. 05/08/17  Yes Karamalegos, Netta Neat, DO  furosemide (LASIX) 20 MG tablet Take 1 tablet (20 mg total) by mouth daily. 09/07/17  Yes Anice Paganini, NP  gabapentin (NEURONTIN) 100 MG capsule Start 1 capsule daily, increase by 1 cap every 2-3 days as tolerated up to 3 times a day, or may take 3 at once in evening. Patient taking differently: Take 100-200 mg by mouth 3 (three) times daily as needed (for pain.).  06/05/17  Yes Karamalegos, Netta Neat, DO  lurasidone (LATUDA) 20 MG TABS tablet Take 1 tablet (20 mg total) by mouth daily. 10/10/17  Yes Hisada, Barbee Cough, MD  Melatonin 10 MG TABS Take 30 mg by mouth at bedtime.   Yes [provider]  ondansetron (ZOFRAN-ODT) 4 MG disintegrating tablet Take 4 mg by mouth every 8 (eight) hours as needed (for nausea/vomiting.).  08/14/17  Yes [provider]  sertraline (ZOLOFT) 50 MG tablet Take 1 tablet (50 mg total) by mouth daily. 10/10/17  Yes Hisada, Barbee Cough, MD  sildenafil (REVATIO) 20 MG tablet TAKE 1 TO 5 TABLETS BY MOUTH ONCE DAILY AS  NEEDED Patient taking differently: Take 20-100 mg by mouth as needed (for ED).  05/08/17  Yes Karamalegos, Alexander J, DO  fluticasone (FLONASE) 50 MCG/ACT nasal spray PLACE 2 SPRAYS INTO BOTH NOSTRILS DAILY. USE FOR 4-6 WKS THEN STOP AND USE SEASONALLY OR AS NEEDED. Patient taking differently: PLACE 2 SPRAYS INTO BOTH NOSTRILS DAILY AS NEEDED FOR ALLERGIES. 07/31/17   Karamalegos, Netta Neat, DO    No current facility-administered medications for this encounter.    Current Outpatient Medications  Medication Sig Dispense Refill  . ALPRAZolam (XANAX) 0.5 MG tablet Take 0.5-1 tablets (0.25-0.5 mg total) by mouth 2 (two) times daily as needed for anxiety. 60 tablet 2  . amLODipine (NORVASC) 5 MG tablet Take 1 tablet (5 mg total) by mouth daily. 30 tablet 5  . furosemide (LASIX) 20 MG tablet Take 1 tablet (20 mg total) by mouth daily. 30 tablet 3  . gabapentin (NEURONTIN) 100 MG capsule Start 1 capsule daily, increase by 1 cap every 2-3 days as tolerated up to 3 times a day, or may take 3 at once in evening. (Patient taking differently: Take 100-200 mg by mouth 3 (three) times daily as needed (for pain.). ) 270 capsule 1  . lurasidone (LATUDA) 20 MG TABS tablet Take 1 tablet (20 mg total) by mouth daily. 90 tablet 0  . Melatonin 10 MG TABS Take 30 mg by mouth at bedtime.    Marland Kitchen  ondansetron (ZOFRAN-ODT) 4 MG disintegrating tablet Take 4 mg by mouth every 8 (eight) hours as needed (for nausea/vomiting.).     Marland Kitchen. sertraline (ZOLOFT) 50 MG tablet Take 1 tablet (50 mg total) by mouth daily. 90 tablet 0  . sildenafil (REVATIO) 20 MG tablet TAKE 1 TO 5 TABLETS BY MOUTH ONCE DAILY AS NEEDED (Patient taking differently: Take 20-100 mg by mouth as needed (for ED). ) 50 tablet 5  . fluticasone (FLONASE) 50 MCG/ACT nasal spray PLACE 2 SPRAYS INTO BOTH NOSTRILS DAILY. USE FOR 4-6 WKS THEN STOP AND USE SEASONALLY OR AS NEEDED. (Patient taking differently: PLACE 2 SPRAYS INTO BOTH NOSTRILS DAILY AS NEEDED FOR ALLERGIES.)  16 g 2    Allergies as of 10/22/2017 - Review Complete 10/22/2017  Allergen Reaction Noted  . Bee venom Swelling and Other (See Comments) 12/28/2015  . Ibuprofen Other (See Comments) 05/11/2017  . Pineapple Nausea And Vomiting 10/06/2014  . Pollen extract Other (See Comments) 06/03/2015  . Tylenol [acetaminophen] Other (See Comments) 05/11/2017    Family History  Problem Relation Age of Onset  . Leukemia Father   . Colon cancer Neg Hx   . Liver disease Neg Hx     Social History   Socioeconomic History  . Marital status: Single    Spouse name: Not on file  . Number of children: Not on file  . Years of education: Not on file  . Highest education level: Not on file  Occupational History  . Not on file  Social Needs  . Financial resource strain: Not on file  . Food insecurity:    Worry: Not on file    Inability: Not on file  . Transportation needs:    Medical: Not on file    Non-medical: Not on file  Tobacco Use  . Smoking status: Current Every Day Smoker    Packs/day: 0.25    Years: 30.00    Pack years: 7.50    Types: Cigarettes  . Smokeless tobacco: Never Used  Substance and Sexual Activity  . Alcohol use: No    Alcohol/week: 3.6 oz    Types: 6 Cans of beer per week    Frequency: Never    Comment: None as of 03/08/17; previously 1-1.5 cases of beer a day in his 10620s.  . Drug use: No  . Sexual activity: Never    Birth control/protection: None  Lifestyle  . Physical activity:    Days per week: Not on file    Minutes per session: Not on file  . Stress: Not on file  Relationships  . Social connections:    Talks on phone: Not on file    Gets together: Not on file    Attends religious service: Not on file    Active member of club or organization: Not on file    Attends meetings of clubs or organizations: Not on file    Relationship status: Not on file  . Intimate partner violence:    Fear of current or ex partner: Not on file    Emotionally abused: Not on  file    Physically abused: Not on file    Forced sexual activity: Not on file  Other Topics Concern  . Not on file  Social History Narrative  . Not on file    Review of Systems: General: Negative for anorexia, weight loss, fever, chills, fatigue, weakness. ENT: Negative for hoarseness, difficulty swallowing , nasal congestion. CV: Negative for chest pain, angina, palpitations, dyspnea on exertion, peripheral  edema.  Respiratory: Negative for dyspnea at rest, dyspnea on exertion, cough, sputum, wheezing.  GI: See history of present illness. Derm: Negative for rash or itching.  Endo: Negative for unusual weight change.  Heme: Negative for bruising or bleeding. Allergy: Negative for rash or hives.  Physical Exam: Vital signs in last 24 hours: Temp:  [97.9 F (36.6 C)] 97.9 F (36.6 C) (07/22 0749) Pulse Rate:  [65-88] 65 (07/22 1200) Resp:  [16-20] 16 (07/22 1000) BP: (124-145)/(67-82) 129/74 (07/22 1200) SpO2:  [96 %-100 %] 100 % (07/22 1200) Weight:  [233 lb (105.7 kg)] 233 lb (105.7 kg) (07/22 0747)   General:   Alert,  Well-developed, well-nourished, pleasant and cooperative in NAD Head:  Normocephalic and atraumatic. Eyes:  Sclera clear, no icterus. Conjunctiva pink. Ears:  Normal auditory acuity. Neck:  Supple; no masses or thyromegaly. Lungs:  Clear throughout to auscultation. No wheezes, crackles, or rhonchi. No acute distress. Heart:  Regular rate and rhythm; no murmurs, clicks, rubs,  or gallops. Abdomen:  Soft, and nondistended. Mild to moderate lower abdominal TTP. No masses, hepatosplenomegaly or hernias noted. Normal bowel sounds, without guarding, and without rebound.   Rectal:  Deferred until time of colonoscopy.   Msk:  Symmetrical without gross deformities. Pulses:  Normal pulses noted. Extremities:  Without clubbing or edema. Neurologic:  Alert and  oriented x4;  grossly normal neurologically. Skin:  Intact without significant lesions or rashes. Psych:   Alert and cooperative. Normal mood and affect.  Intake/Output from previous day: No intake/output data recorded. Intake/Output this shift: Total I/O In: 1000 [IV Piggyback:1000] Out: -   Lab Results: Recent Labs    10/22/17 0834  WBC 7.4  HGB 11.8*  HCT 35.3*  PLT 117*   BMET Recent Labs    10/22/17 0834  NA 143  K 3.5  CL 112*  CO2 25  GLUCOSE 103*  BUN 8  CREATININE 0.84  CALCIUM 8.8*   LFT Recent Labs    10/22/17 0834  PROT 7.5  ALBUMIN 3.0*  AST 41  ALT 25  ALKPHOS 154*  BILITOT 3.1*   PT/INR Recent Labs    10/22/17 0834  LABPROT 21.0*  INR 1.82   Hepatitis Panel No results for input(s): HEPBSAG, HCVAB, HEPAIGM, HEPBIGM in the last 72 hours. C-Diff No results for input(s): CDIFFTOX in the last 72 hours.  Studies/Results: Ct Abdomen Pelvis W Contrast  Result Date: 10/22/2017 CLINICAL DATA:  Rectal bleeding for 2 days with abdominal pain EXAM: CT ABDOMEN AND PELVIS WITH CONTRAST TECHNIQUE: Multidetector CT imaging of the abdomen and pelvis was performed using the standard protocol following bolus administration of intravenous contrast. CONTRAST:  OMNIPAQUE IOHEXOL 300 MG/ML  SOLN COMPARISON:  09/12/2017 ultrasound of the abdomen, 05/02/2017 CT of the abdomen and pelvis. FINDINGS: Lower chest: No acute abnormality. Hepatobiliary: The liver is again somewhat nodular in appearance without focal mass lesion. Portal vein appears patent. Mild ascites is identified. The gallbladder is decompressed with evidence of a tiny gallstone within. Pancreas: Pancreas is well visualized without focal mass lesion. No ductal dilatation is seen. Spleen: Spleen is within normal limits. Adrenals/Urinary Tract: Adrenal glands are unremarkable. The kidneys are well visualize without renal calculi or obstructive change. The bladder is partially distended. Stomach/Bowel: Scattered diverticular change of the colon is noted without definitive diverticulitis. The appendix is within  normal limits. No small bowel abnormality is seen. Vascular/Lymphatic: Atherosclerotic changes of the abdominal aorta are noted without aneurysmal dilatation. No venous abnormality is seen. Reproductive:  Prostate is within normal limits. Other: Some increase in edematous change within the right pericolic gutter and upper abdomen as well as the perihepatic fat is noted. These changes have increased when compared with the prior exam and are likely related to some progressive portal hypertension in the underlying ascites. Musculoskeletal: Degenerative changes of lumbar spine are noted. IMPRESSION: Changes consistent with cirrhosis of the liver with some progression in the degree of portal hypertension with mild ascites and increasing mesenteric edema. Contracted gallbladder with small gallstone within. Diverticulosis without diverticulitis. Electronically Signed   By: Alcide Clever M.D.   On: 10/22/2017 10:35    Impression: Very nice 57 year old male known to our service with a history of recently diagnosed cirrhosis likely multifactorial with previous excessive alcohol consumption as well as positive hepatitis C.  He has not had any alcohol since December 2018.  He just finished treatment for hepatitis C at the liver clinic in [place] with Epclusa and ribavirin.  His bilirubin seems improved compared to his last values.  His INR is a bit elevated however.  His meld score seems to have increased from 15-17.  He is child Pugh C.  We could expect, possibly, some improvement in his liver function after hepatitis C eradication.  He presents today with mild abdominal pain and hematochezia for the past 24 hours.  He had 5 stools with hematochezia, notes soft bowel movements which typically solidify further.  He did have some blood in his underwear this morning.  He does have pancolonic diverticula with worsening in the left colon.  He does have a history of internal hemorrhoids.  His colonoscopy was last completed in  2016.  He has had several EGDs in the past 1 to 2 years for variceal screening and banding.  His most recent EGD was completed 09/11/2017 which found 5 columns of nonbleeding grade 1 and grade 2 esophageal varices, moderate portal hypertensive gastropathy, erythematous to adenopathy.  Recommended repeat endoscopy in 3 months for retreatment.   There was some concern on behalf of the hospitalist for possible mesenteric ischemia.  Dr. Darrick Penna and myself reviewed the films with the radiologist who states his mesenteric vasculature looks good.  Doubt mesenteric ischemia.  At this point the differentials likely include diverticular bleed versus hemorrhoid bleeding.  He has not had any further bleeding since presentation to the hospital.  Plan: 1. Monitor for recurrent GI bleed 2. Monitor hemoglobin closely 3. Transfuse as necessary 4. No need for endoscopic evaluation at this time 5. Advance to full liquid diet 6. Reassess in the morning 7. Supportive measures  Thank you for allowing [place] to participate in the care of Shelbie Hutching, DNP, AGNP-C Adult & Gerontological Nurse Practitioner [facility]    LOS: 0 days     10/22/2017, 1:00 PM

## 2017-10-22 NOTE — ED Provider Notes (Signed)
[facility] EMERGENCY DEPARTMENT Provider Note   CSN: [id number] Arrival date & time: 10/22/17  4098     History   Chief Complaint Chief Complaint  Patient presents with  . Rectal Bleeding    HPI Christian Sanders is a 57 y.o. male.  HPI  57 year old male with a history of cirrhosis, hepatitis C, and prior history of varices presents with rectal bleeding.  He states that since yesterday has been having multiple bloody stools.  He states that he has half blood that is dark and a half stool.  This happened about 5 times yesterday.  He is been feeling dizzy when he stands since.  Today when he woke up he just had blood in his underwear.  Since this morning is also been having some lower abdominal pain, worst in the right side.  He has not been having any vomiting.  He is not on a blood thinner and has not been taking ibuprofen.  He does not take more than 2000 mg of Tylenol per day.  Pain is about a 7 out of 10. Sharp/aching.  There is some rectal pain when going to the bathroom but he has not been having rectal pain otherwise.  He just finished up some antibiotics for cellulitis.  According to the chart this was doxycycline.  Past Medical History:  Diagnosis Date  . Abnormal CT of liver 12/20/2016  . Anxiety   . Bipolar disorder (HCC)   . Dentures complicating chewing    full upper, partial lower  . Depression   . Esophageal varices determined by endoscopy (HCC)   . Gastritis and duodenitis   . GERD (gastroesophageal reflux disease)   . Headache    chronic, s/p facial injury and reconstruction  . Hypertension   . Portal hypertensive gastropathy (HCC)   . PTSD (post-traumatic stress disorder)   . Vertebral column disorder    2 crushed thoracic vertebra    Patient Active Problem List   Diagnosis Date Noted  . Hematochezia 10/22/2017  . Hepatitis C 09/06/2017  . Mild episode of recurrent major depressive disorder (HCC) 06/13/2017  . Panic disorder 06/13/2017  . Alcohol use  disorder, severe, in early remission (HCC) 06/13/2017  . Esophageal varices in alcoholic cirrhosis (HCC)   . Depression 05/08/2017  . Varices of esophagus determined by endoscopy (HCC)   . Gastritis and duodenitis   . Splenomegaly 03/08/2017  . Hepatic cirrhosis (HCC) 12/27/2016  . RUQ pain 12/27/2016  . Portal hypertension (HCC) 12/27/2016  . Elevated liver enzymes 03/02/2016  . BPH without urinary obstruction 02/28/2016  . ED (erectile dysfunction) 12/28/2015  . SK (seborrheic keratosis) 12/28/2015  . Chronic headaches 02/09/2015  . Tobacco abuse 02/09/2015  . Hypertension 11/23/2014  . PTSD (post-traumatic stress disorder) 11/23/2014  . Special screening for malignant neoplasms, colon   . Chronic mid back pain 12/16/2013  . Closed compression fracture of thoracic vertebra (HCC) 09/05/2013    Past Surgical History:  Procedure Laterality Date  . BIOPSY  04/17/2017   Procedure: BIOPSY;  Surgeon: West Bali, MD;  Location: [facility];  Service: Endoscopy;;  gastric for h pylori  . COLONOSCOPY WITH PROPOFOL N/A 10/09/2014   Procedure: COLONOSCOPY WITH PROPOFOL;  Surgeon: Midge Minium, MD;  Location: [facility];  Service: Endoscopy;  Laterality: N/A;  . ESOPHAGEAL BANDING N/A 04/17/2017   Procedure: ESOPHAGEAL BANDING;  Surgeon: West Bali, MD;  Location: [facility];  Service: Endoscopy;  Laterality: N/A;  . ESOPHAGEAL BANDING N/A 05/22/2017  Procedure: ESOPHAGEAL BANDING;  Surgeon: West Bali, MD;  Location: [facility];  Service: Endoscopy;  Laterality: N/A;  . ESOPHAGEAL BANDING N/A 06/26/2017   Procedure: ESOPHAGEAL BANDING;  Surgeon: West Bali, MD;  Location: [facility];  Service: Endoscopy;  Laterality: N/A;  . ESOPHAGOGASTRODUODENOSCOPY (EGD) WITH PROPOFOL N/A 04/17/2017   Procedure: ESOPHAGOGASTRODUODENOSCOPY (EGD) WITH PROPOFOL;  Surgeon: West Bali, MD;  Location: [facility];  Service: Endoscopy;  Laterality: N/A;  10:45am  .  ESOPHAGOGASTRODUODENOSCOPY (EGD) WITH PROPOFOL N/A 05/22/2017   Procedure: ESOPHAGOGASTRODUODENOSCOPY (EGD) WITH PROPOFOL;  Surgeon: West Bali, MD;  Location: [facility];  Service: Endoscopy;  Laterality: N/A;  . ESOPHAGOGASTRODUODENOSCOPY (EGD) WITH PROPOFOL N/A 06/26/2017   Procedure: ESOPHAGOGASTRODUODENOSCOPY (EGD) WITH PROPOFOL;  Surgeon: West Bali, MD;  Location: [facility];  Service: Endoscopy;  Laterality: N/A;  1:45pm  . ESOPHAGOGASTRODUODENOSCOPY (EGD) WITH PROPOFOL N/A 09/11/2017   Procedure: ESOPHAGOGASTRODUODENOSCOPY (EGD) WITH PROPOFOL;  Surgeon: West Bali, MD;  Location: [facility];  Service: Endoscopy;  Laterality: N/A;  10:30am  . FACIAL RECONSTRUCTION SURGERY     eye socket with 2 metal plates        Home Medications    Prior to Admission medications   Medication Sig Start Date End Date Taking? Authorizing Provider  ALPRAZolam Prudy Feeler) 0.5 MG tablet Take 0.5-1 tablets (0.25-0.5 mg total) by mouth 2 (two) times daily as needed for anxiety. 10/18/17  Yes Hisada, Barbee Cough, MD  amLODipine (NORVASC) 5 MG tablet Take 1 tablet (5 mg total) by mouth daily. 05/08/17  Yes Karamalegos, Netta Neat, DO  furosemide (LASIX) 20 MG tablet Take 1 tablet (20 mg total) by mouth daily. 09/07/17  Yes Anice Paganini, NP  gabapentin (NEURONTIN) 100 MG capsule Start 1 capsule daily, increase by 1 cap every 2-3 days as tolerated up to 3 times a day, or may take 3 at once in evening. Patient taking differently: Take 100-200 mg by mouth 3 (three) times daily as needed (for pain.).  06/05/17  Yes Karamalegos, Netta Neat, DO  lurasidone (LATUDA) 20 MG TABS tablet Take 1 tablet (20 mg total) by mouth daily. 10/10/17  Yes Hisada, Barbee Cough, MD  Melatonin 10 MG TABS Take 30 mg by mouth at bedtime.   Yes [provider]  ondansetron (ZOFRAN-ODT) 4 MG disintegrating tablet Take 4 mg by mouth every 8 (eight) hours as needed (for nausea/vomiting.).  08/14/17  Yes [provider]    sertraline (ZOLOFT) 50 MG tablet Take 1 tablet (50 mg total) by mouth daily. 10/10/17  Yes Hisada, Barbee Cough, MD  sildenafil (REVATIO) 20 MG tablet TAKE 1 TO 5 TABLETS BY MOUTH ONCE DAILY AS NEEDED Patient taking differently: Take 20-100 mg by mouth as needed (for ED).  05/08/17  Yes Karamalegos, Alexander J, DO  fluticasone (FLONASE) 50 MCG/ACT nasal spray PLACE 2 SPRAYS INTO BOTH NOSTRILS DAILY. USE FOR 4-6 WKS THEN STOP AND USE SEASONALLY OR AS NEEDED. Patient taking differently: PLACE 2 SPRAYS INTO BOTH NOSTRILS DAILY AS NEEDED FOR ALLERGIES. 07/31/17   Smitty Cords, DO    Family History Family History  Problem Relation Age of Onset  . Leukemia Father   . Colon cancer Neg Hx   . Liver disease Neg Hx     Social History Social History   Tobacco Use  . Smoking status: Current Every Day Smoker    Packs/day: 0.25    Years: 30.00    Pack years: 7.50    Types: Cigarettes  .  Smokeless tobacco: Never Used  Substance Use Topics  . Alcohol use: No    Alcohol/week: 3.6 oz    Types: 6 Cans of beer per week    Frequency: Never    Comment: None as of 03/08/17; previously 1-1.5 cases of beer a day in his 75s.  . Drug use: No     Allergies   Bee venom; Ibuprofen; Pineapple; Pollen extract; and Tylenol [acetaminophen]   Review of Systems Review of Systems  Gastrointestinal: Positive for abdominal pain and blood in stool. Negative for vomiting.  Neurological: Positive for light-headedness.  All other systems reviewed and are negative.    Physical Exam Updated Vital Signs BP 140/83 (BP Location: Left Arm)   Pulse 69   Temp 98 F (36.7 C) (Oral)   Resp 18   Ht 6\' 2"  (1.88 m)   Wt 105.7 kg (233 lb)   SpO2 100%   BMI 29.92 kg/m   Physical Exam  Constitutional: He is oriented to person, place, and time. He appears well-developed and well-nourished.  HENT:  Head: Normocephalic and atraumatic.  Right Ear: External ear normal.  Left Ear: External ear normal.  Nose:  Nose normal.  Eyes: Right eye exhibits no discharge. Left eye exhibits no discharge.  Neck: Neck supple.  Cardiovascular: Normal rate, regular rhythm and normal heart sounds.  Pulmonary/Chest: Effort normal and breath sounds normal.  Abdominal: Soft. There is tenderness (worst in RLQ) in the right upper quadrant, right lower quadrant, suprapubic area and left lower quadrant.  Genitourinary: Rectal exam shows guaiac positive stool. Rectal exam shows no external hemorrhoid, no fissure, no mass and anal tone normal.  Genitourinary Comments: Minimal stool that is not grossly bloody on rectal exam.  No external hemorrhoids.  He is uncomfortable during the exam but there are no masses.  Musculoskeletal: He exhibits no edema.  Neurological: He is alert and oriented to person, place, and time.  Skin: Skin is warm and dry.  Nursing note and vitals reviewed.    ED Treatments / Results  Labs (all labs ordered are listed, but only abnormal results are displayed) Labs Reviewed  COMPREHENSIVE METABOLIC PANEL - Abnormal; Notable for the following components:      Result Value   Chloride 112 (*)    Glucose, Bld 103 (*)    Calcium 8.8 (*)    Albumin 3.0 (*)    Alkaline Phosphatase 154 (*)    Total Bilirubin 3.1 (*)    All other components within normal limits  CBC - Abnormal; Notable for the following components:   RBC 3.32 (*)    Hemoglobin 11.8 (*)    HCT 35.3 (*)    MCV 106.3 (*)    MCH 35.5 (*)    Platelets 117 (*)    All other components within normal limits  URINALYSIS, ROUTINE W REFLEX MICROSCOPIC - Abnormal; Notable for the following components:   APPearance HAZY (*)    Hgb urine dipstick SMALL (*)    All other components within normal limits  PROTIME-INR - Abnormal; Notable for the following components:   Prothrombin Time 21.0 (*)    All other components within normal limits  POC OCCULT BLOOD, ED - Abnormal; Notable for the following components:   Fecal Occult Bld POSITIVE (*)     All other components within normal limits  C DIFFICILE QUICK SCREEN W PCR REFLEX  HIV ANTIBODY (ROUTINE TESTING)  TYPE AND SCREEN    EKG None  Radiology Ct Abdomen Pelvis W Contrast  Result Date: 10/22/2017 CLINICAL DATA:  Rectal bleeding for 2 days with abdominal pain EXAM: CT ABDOMEN AND PELVIS WITH CONTRAST TECHNIQUE: Multidetector CT imaging of the abdomen and pelvis was performed using the standard protocol following bolus administration of intravenous contrast. CONTRAST:  100mL OMNIPAQUE IOHEXOL 300 MG/ML  SOLN COMPARISON:  09/12/2017 ultrasound of the abdomen, 05/02/2017 CT of the abdomen and pelvis. FINDINGS: Lower chest: No acute abnormality. Hepatobiliary: The liver is again somewhat nodular in appearance without focal mass lesion. Portal vein appears patent. Mild ascites is identified. The gallbladder is decompressed with evidence of a tiny gallstone within. Pancreas: Pancreas is well visualized without focal mass lesion. No ductal dilatation is seen. Spleen: Spleen is within normal limits. Adrenals/Urinary Tract: Adrenal glands are unremarkable. The kidneys are well visualize without renal calculi or obstructive change. The bladder is partially distended. Stomach/Bowel: Scattered diverticular change of the colon is noted without definitive diverticulitis. The appendix is within normal limits. No small bowel abnormality is seen. Vascular/Lymphatic: Atherosclerotic changes of the abdominal aorta are noted without aneurysmal dilatation. No venous abnormality is seen. Reproductive: Prostate is within normal limits. Other: Some increase in edematous change within the right pericolic gutter and upper abdomen as well as the perihepatic fat is noted. These changes have increased when compared with the prior exam and are likely related to some progressive portal hypertension in the underlying ascites. Musculoskeletal: Degenerative changes of lumbar spine are noted. IMPRESSION: Changes consistent  with cirrhosis of the liver with some progression in the degree of portal hypertension with mild ascites and increasing mesenteric edema. Contracted gallbladder with small gallstone within. Diverticulosis without diverticulitis. Electronically Signed   By: Alcide CleverMark  Lukens M.D.   On: 10/22/2017 10:35    Procedures Procedures (including critical care time)  Medications Ordered in ED Medications  ALPRAZolam (XANAX) tablet 0.25-0.5 mg (has no administration in time range)  amLODipine (NORVASC) tablet 5 mg (5 mg Oral Given 10/22/17 1352)  furosemide (LASIX) tablet 20 mg (20 mg Oral Given 10/22/17 1352)  gabapentin (NEURONTIN) capsule 100-200 mg (has no administration in time range)  lurasidone (LATUDA) tablet 20 mg (has no administration in time range)  Melatonin TABS 9 mg (has no administration in time range)  sertraline (ZOLOFT) tablet 50 mg (50 mg Oral Given 10/22/17 1352)  acetaminophen (TYLENOL) tablet 650 mg (has no administration in time range)    Or  acetaminophen (TYLENOL) suppository 650 mg (has no administration in time range)  ondansetron (ZOFRAN) tablet 4 mg (has no administration in time range)    Or  ondansetron (ZOFRAN) injection 4 mg (has no administration in time range)  oxyCODONE (Oxy IR/ROXICODONE) immediate release tablet 5 mg (5 mg Oral Given 10/22/17 1351)  sodium chloride 0.9 % bolus 1,000 mL (0 mLs Intravenous Stopped 10/22/17 1010)  HYDROmorphone (DILAUDID) injection 1 mg (1 mg Intravenous Given 10/22/17 0854)  iohexol (OMNIPAQUE) 300 MG/ML solution 100 mL (100 mLs Intravenous Contrast Given 10/22/17 1014)  HYDROmorphone (DILAUDID) injection 1 mg (1 mg Intravenous Given 10/22/17 1122)     Initial Impression / Assessment and Plan / ED Course  I have reviewed the triage vital signs and the nursing notes.  Pertinent labs & imaging results that were available during my care of the patient were reviewed by me and considered in my medical decision making (see chart for  details).  Clinical Course as of Oct 23 1610  Mon Oct 22, 2017  1058 HCT(!): 35.3 [RH]  1059 HCT(!): 35.3 [RH]    Clinical Course User  Index [RH] Julieanne Manson, [place] Neighbor    Patient's bleeding has stopped currently but he is higher risk for significant bleeding with his cirrhosis.  I doubt this is a variceal bleed although he has had varices in the past as he has no upper GI symptoms.  I will also send testing for C. difficile given he just finished a course of antibiotics.  However is not having a typical volume that he would expect with C. difficile.  Discussed with his gastroenterologist, Dr. Darrick Penna, who will consult but asked for hospitalist admit.  Dr. Arbutus Leas to admit.  Final Clinical Impressions(s) / ED Diagnoses   Final diagnoses:  Acute GI bleeding    ED Discharge Orders    None       Pricilla Loveless, MD 10/22/17 [phone]

## 2017-10-23 ENCOUNTER — Telehealth: Payer: Self-pay | Admitting: Gastroenterology

## 2017-10-23 DIAGNOSIS — R1030 Lower abdominal pain, unspecified: Secondary | ICD-10-CM | POA: Diagnosis not present

## 2017-10-23 DIAGNOSIS — K7031 Alcoholic cirrhosis of liver with ascites: Secondary | ICD-10-CM | POA: Diagnosis not present

## 2017-10-23 DIAGNOSIS — K922 Gastrointestinal hemorrhage, unspecified: Secondary | ICD-10-CM | POA: Diagnosis not present

## 2017-10-23 DIAGNOSIS — D689 Coagulation defect, unspecified: Secondary | ICD-10-CM | POA: Diagnosis not present

## 2017-10-23 DIAGNOSIS — R69 Illness, unspecified: Secondary | ICD-10-CM | POA: Diagnosis not present

## 2017-10-23 DIAGNOSIS — K7030 Alcoholic cirrhosis of liver without ascites: Secondary | ICD-10-CM

## 2017-10-23 DIAGNOSIS — K766 Portal hypertension: Secondary | ICD-10-CM | POA: Diagnosis not present

## 2017-10-23 DIAGNOSIS — Z720 Tobacco use: Secondary | ICD-10-CM | POA: Diagnosis not present

## 2017-10-23 DIAGNOSIS — D696 Thrombocytopenia, unspecified: Secondary | ICD-10-CM | POA: Diagnosis not present

## 2017-10-23 DIAGNOSIS — K921 Melena: Secondary | ICD-10-CM | POA: Diagnosis not present

## 2017-10-23 LAB — COMPREHENSIVE METABOLIC PANEL
ALK PHOS: 129 U/L — AB (ref 38–126)
ALT: 23 U/L (ref 0–44)
ANION GAP: 5 (ref 5–15)
AST: 38 U/L (ref 15–41)
Albumin: 2.7 g/dL — ABNORMAL LOW (ref 3.5–5.0)
BUN: 7 mg/dL (ref 6–20)
CALCIUM: 8.4 mg/dL — AB (ref 8.9–10.3)
CO2: 25 mmol/L (ref 22–32)
Chloride: 111 mmol/L (ref 98–111)
Creatinine, Ser: 0.72 mg/dL (ref 0.61–1.24)
GFR calc Af Amer: >60 mL/min (ref >60)
Glucose, Bld: 78 mg/dL (ref 70–99)
POTASSIUM: 3.6 mmol/L (ref 3.5–5.1)
Sodium: 141 mmol/L (ref 135–145)
TOTAL PROTEIN: 6.5 g/dL (ref 6.5–8.1)
Total Bilirubin: 3.2 mg/dL — ABNORMAL HIGH (ref 0.3–1.2)

## 2017-10-23 LAB — C DIFFICILE QUICK SCREEN W PCR REFLEX
C DIFFICILE (CDIFF) INTERP: NOT DETECTED
C DIFFICLE (CDIFF) ANTIGEN: NEGATIVE
C Diff toxin: NEGATIVE

## 2017-10-23 LAB — CBC
HEMATOCRIT: 31.1 % — AB (ref 39.0–52.0)
HEMOGLOBIN: 10.4 g/dL — AB (ref 13.0–17.0)
MCH: 35.9 pg — ABNORMAL HIGH (ref 26.0–34.0)
MCHC: 33.4 g/dL (ref 30.0–36.0)
MCV: 107.2 fL — ABNORMAL HIGH (ref 78.0–100.0)
Platelets: 111 10*3/uL — ABNORMAL LOW (ref 150–400)
RBC: 2.9 MIL/uL — ABNORMAL LOW (ref 4.22–5.81)
RDW: 14.8 % (ref 11.5–15.5)
WBC: 7.2 10*3/uL (ref 4.0–10.5)

## 2017-10-23 LAB — HIV ANTIBODY (ROUTINE TESTING W REFLEX): HIV Screen 4th Generation wRfx: NONREACTIVE

## 2017-10-23 LAB — PROTIME-INR
INR: 1.93
PROTHROMBIN TIME: 21.9 s — AB (ref 11.4–15.2)

## 2017-10-23 MED ORDER — OXYCODONE HCL 10 MG PO TABS: 10.0000 mg | ORAL_TABLET | Freq: Four times a day (QID) | ORAL | 0 refills | Status: DC | PRN

## 2017-10-23 MED ORDER — OXYCODONE HCL 5 MG PO TABS
10.0000 mg | ORAL_TABLET | Freq: Four times a day (QID) | ORAL | Status: DC | PRN
  Administered 2017-10-23: 10 mg via ORAL
  Filled 2017-10-23: qty 2

## 2017-10-23 NOTE — Progress Notes (Signed)
CC'D TO PCP °

## 2017-10-23 NOTE — Discharge Instructions (Signed)
Gastrointestinal Bleeding °Gastrointestinal bleeding is bleeding somewhere along the path food travels through the body (digestive tract). This path is anywhere between the mouth and the opening of the butt (anus). You may have blood in your poop (stools) or have black poop. If you throw up (vomit), there may be blood in it. °This condition can be mild, serious, or even life-threatening. If you have a lot of bleeding, you may need to stay in the hospital. °Follow these instructions at home: °· Take over-the-counter and prescription medicines only as told by your doctor. °· Eat foods that have a lot of fiber in them. These foods include whole grains, fruits, and vegetables. You can also try eating 1-3 prunes each day. °· Drink enough fluid to keep your pee (urine) clear or pale yellow. °· Keep all follow-up visits as told by your doctor. This is important. °Contact a doctor if: °· Your symptoms do not get better. °Get help right away if: °· Your bleeding gets worse. °· You feel dizzy or you pass out (faint). °· You feel weak. °· You have very bad cramps in your back or belly (abdomen). °· You pass large clumps of blood (clots) in your poop. °· Your symptoms are getting worse. °This information is not intended to replace advice given to you by your health care provider. Make sure you discuss any questions you have with your health care provider. °Document Released: 12/28/2007 Document Revised: 08/26/2015 Document Reviewed: 09/07/2014 °Elsevier Interactive Patient Education © 2018 [employer] ° °

## 2017-10-23 NOTE — Discharge Summary (Signed)
Physician Discharge Summary  Christian Sanders ZOX:[id number] DOB: 01/17/1961 DOA: 10/22/2017  PCP: Smitty Cords, DO  Admit date: 10/22/2017 Discharge date: 10/23/2017  Admitted From: Home Disposition:  Home  Recommendations for Outpatient Follow-up:  1. Follow up with PCP in 1-2 weeks 2. Please obtain BMP/CBC in one week    Discharge Condition: Stable CODE STATUS:FULL Diet recommendation: Heart Healthy   Brief/Interim Summary: 57 y.o.malewith medical history ofliver cirrhosis secondary to alcohol and hepatitis C, hypertension, bipolar disorder presenting with 1 day history of hematochezia that began on 10/21/2017. The patient stated that he had 5 small bowel movements with some maroon type stools on 10/21/2017 with associated right lower quadrant abdominal cramping. He denies eating any raw or undercooked foods. He denies any recent travels or sick contacts. He denies any fevers, chills, chest pain, shortness breath, nausea, vomiting, hematemesis. He denies taking any NSAIDs. The patient states that he recently finished a two-week course of doxycycline for lower extremity cellulitis. He continues to smoke 3 to 4 cigarettes a day with a history of 40 pack years. Because of worsening abdominal pain and seeing blood on his undergarment, the patient presented to emergency department for further evaluation.  In the emergency department, the patient was afebrile hemodynamically stable saturating 100% on room air. BMP, LFTs, and CBC were essentially unremarkable except with thrombocytopenia 117,000. INR was 1.82. CT of abdomen and pelvis showed a nodular liver with mild ascites and diverticulosis. There was some mild increase in mesenteric edema.  GI was consulted to assist.    Discharge Diagnoses:  Hematochezia -Concerned about ischemic colitis -hemorrhoidal vs diverticular bleed also possible -full liquids>>>soft diet -GI consult appreciated--no plans for endoscopy  presently -09/11/2017 EGD nonbleeding grade 1 and 2 esophageal varices, moderate portal hypertensive gastropathy, erythematous to adenopathy. -10/22/17--CT abd--increase in edematous change within the right pericolic gutter and upper abdomen as well as the perihepatic fat is noted -overall pain improving--home with oxycodone 10 mg #12, no RF -GI cleared pt for d/c on 7/23 -HGB largely stable, no further hematochezia  Liver cirrhosis with ascites -Secondary to alcohol and chronic hepatitis C -Appears compensated presently -Holding furosemide temporarily-->restart after d/c  Coagulopathy -10/22/17--vitamin K x 1 -INR still high despite tx -GI will refer pt to [facility] transplant  Thrombocytopenia -due to liver cirrhosis -overall stable  Chronic hepatitis C -Recently finishedEpclusa and ribavirn  Bipolar disorder -Patient states that he only intermittently takes Zoloft and Jordan  Essential hypertension -Continue amlodipine  Lower extremity pain and edema -Venous duplex--neg  Tobacco abuse I have discussed tobacco cessation with the patient.  I have counseled the patient regarding the negative impacts of continued tobacco use including but not limited to lung cancer, COPD, and cardiovascular disease.  I have discussed alternatives to tobacco and modalities that may help facilitate tobacco cessation including but not limited to biofeedback, hypnosis, and medications.  Total time spent with tobacco counseling was 4 minutes.     Discharge Instructions   Allergies as of 10/23/2017      Reactions   Bee Venom Swelling, Other (See Comments)   Throat and Tongue    Ibuprofen Other (See Comments)   Due to liver   Pineapple Nausea And Vomiting   Pollen Extract Other (See Comments)   Runny nose, itchy, watery eyes, congestion   Tylenol [acetaminophen] Other (See Comments)   Due to liver      Medication List    TAKE these medications   ALPRAZolam 0.5 MG tablet Commonly  known as:  XANAX Take 0.5-1 tablets (0.25-0.5 mg total) by mouth 2 (two) times daily as needed for anxiety.   amLODipine 5 MG tablet Commonly known as:  NORVASC Take 1 tablet (5 mg total) by mouth daily.   fluticasone 50 MCG/ACT nasal spray Commonly known as:  FLONASE PLACE 2 SPRAYS INTO BOTH NOSTRILS DAILY. USE FOR 4-6 WKS THEN STOP AND USE SEASONALLY OR AS NEEDED. What changed:  See the new instructions.   furosemide 20 MG tablet Commonly known as:  LASIX Take 1 tablet (20 mg total) by mouth daily.   gabapentin 100 MG capsule Commonly known as:  NEURONTIN Start 1 capsule daily, increase by 1 cap every 2-3 days as tolerated up to 3 times a day, or may take 3 at once in evening. What changed:    how much to take  how to take this  when to take this  reasons to take this  additional instructions   lurasidone 20 MG Tabs tablet Commonly known as:  LATUDA Take 1 tablet (20 mg total) by mouth daily.   Melatonin 10 MG Tabs Take 30 mg by mouth at bedtime.   ondansetron 4 MG disintegrating tablet Commonly known as:  ZOFRAN-ODT Take 4 mg by mouth every 8 (eight) hours as needed (for nausea/vomiting.).   Oxycodone HCl 10 MG Tabs Take 1 tablet (10 mg total) by mouth every 6 (six) hours as needed for moderate pain.   sertraline 50 MG tablet Commonly known as:  ZOLOFT Take 1 tablet (50 mg total) by mouth daily.   sildenafil 20 MG tablet Commonly known as:  REVATIO TAKE 1 TO 5 TABLETS BY MOUTH ONCE DAILY AS NEEDED What changed:    how much to take  how to take this  when to take this  reasons to take this  additional instructions       Allergies  Allergen Reactions  . Bee Venom Swelling and Other (See Comments)    Throat and Tongue   . Ibuprofen Other (See Comments)    Due to liver  . Pineapple Nausea And Vomiting  . Pollen Extract Other (See Comments)    Runny nose, itchy, watery eyes, congestion  . Tylenol [Acetaminophen] Other (See Comments)    Due to  liver    Consultations:  [facility]   Procedures/Studies: Ct Abdomen Pelvis W Contrast  Result Date: 10/22/2017 CLINICAL DATA:  Rectal bleeding for 2 days with abdominal pain EXAM: CT ABDOMEN AND PELVIS WITH CONTRAST TECHNIQUE: Multidetector CT imaging of the abdomen and pelvis was performed using the standard protocol following bolus administration of intravenous contrast. CONTRAST:  100mL OMNIPAQUE IOHEXOL 300 MG/ML  SOLN COMPARISON:  09/12/2017 ultrasound of the abdomen, 05/02/2017 CT of the abdomen and pelvis. FINDINGS: Lower chest: No acute abnormality. Hepatobiliary: The liver is again somewhat nodular in appearance without focal mass lesion. Portal vein appears patent. Mild ascites is identified. The gallbladder is decompressed with evidence of a tiny gallstone within. Pancreas: Pancreas is well visualized without focal mass lesion. No ductal dilatation is seen. Spleen: Spleen is within normal limits. Adrenals/Urinary Tract: Adrenal glands are unremarkable. The kidneys are well visualize without renal calculi or obstructive change. The bladder is partially distended. Stomach/Bowel: Scattered diverticular change of the colon is noted without definitive diverticulitis. The appendix is within normal limits. No small bowel abnormality is seen. Vascular/Lymphatic: Atherosclerotic changes of the abdominal aorta are noted without aneurysmal dilatation. No venous abnormality is seen. Reproductive: Prostate is within normal limits. Other: Some increase in edematous change  within the right pericolic gutter and upper abdomen as well as the perihepatic fat is noted. These changes have increased when compared with the prior exam and are likely related to some progressive portal hypertension in the underlying ascites. Musculoskeletal: Degenerative changes of lumbar spine are noted. IMPRESSION: Changes consistent with cirrhosis of the liver with some progression in the degree of portal hypertension with mild  ascites and increasing mesenteric edema. Contracted gallbladder with small gallstone within. Diverticulosis without diverticulitis. Electronically Signed   By: Alcide Clever M.D.   On: 10/22/2017 10:35   US Venous Img Lower Bilateral  Result Date: 10/22/2017 CLINICAL DATA:  57 year old male with right lower extremity pain and edema/cellulitis for the past 2-3 weeks. EXAM: BILATERAL LOWER EXTREMITY VENOUS DOPPLER ULTRASOUND TECHNIQUE: Gray-scale sonography with graded compression, as well as color Doppler and duplex ultrasound were performed to evaluate the lower extremity deep venous systems from the level of the common femoral vein and including the common femoral, femoral, profunda femoral, popliteal and calf veins including the posterior tibial, peroneal and gastrocnemius veins when visible. The superficial great saphenous vein was also interrogated. Spectral Doppler was utilized to evaluate flow at rest and with distal augmentation maneuvers in the common femoral, femoral and popliteal veins. COMPARISON:  None. FINDINGS: RIGHT LOWER EXTREMITY Common Femoral Vein: No evidence of thrombus. Normal compressibility, respiratory phasicity and response to augmentation. Saphenofemoral Junction: No evidence of thrombus. Normal compressibility and flow on color Doppler imaging. Profunda Femoral Vein: No evidence of thrombus. Normal compressibility and flow on color Doppler imaging. Femoral Vein: No evidence of thrombus. Normal compressibility, respiratory phasicity and response to augmentation. Popliteal Vein: No evidence of thrombus. Normal compressibility, respiratory phasicity and response to augmentation. Calf Veins: No evidence of thrombus. Normal compressibility and flow on color Doppler imaging. Superficial Great Saphenous Vein: No evidence of thrombus. Normal compressibility. Venous Reflux:  None. Other Findings:  None. LEFT LOWER EXTREMITY Common Femoral Vein: No evidence of thrombus. Normal compressibility,  respiratory phasicity and response to augmentation. Saphenofemoral Junction: No evidence of thrombus. Normal compressibility and flow on color Doppler imaging. Profunda Femoral Vein: No evidence of thrombus. Normal compressibility and flow on color Doppler imaging. Femoral Vein: No evidence of thrombus. Normal compressibility, respiratory phasicity and response to augmentation. Popliteal Vein: No evidence of thrombus. Normal compressibility, respiratory phasicity and response to augmentation. Calf Veins: No evidence of thrombus. Normal compressibility and flow on color Doppler imaging. Superficial Great Saphenous Vein: No evidence of thrombus. Normal compressibility. Venous Reflux:  None. Other Findings:  None. IMPRESSION: No evidence of deep venous thrombosis in either lower extremity. Electronically Signed   By: Malachy Moan M.D.   On: 10/22/2017 16:46         Discharge Exam: Vitals:   10/22/17 2209 10/23/17 0546  BP: 119/70 123/63  Pulse: 79 81  Resp: 18 18  Temp: 98.2 F (36.8 C) 98.4 F (36.9 C)  SpO2: 98% 99%   Vitals:   10/22/17 1323 10/22/17 2034 10/22/17 2209 10/23/17 0546  BP: 140/83  119/70 123/63  Pulse: 69  79 81  Resp: 18  18 18   Temp: 98 F (36.7 C)  98.2 F (36.8 C) 98.4 F (36.9 C)  TempSrc: Oral  Oral Oral  SpO2: 100% 94% 98% 99%  Weight: 105.7 kg (233 lb)     Height: 6\' 2"  (1.88 m)       General: Pt is alert, awake, not in acute distress Cardiovascular: RRR, S1/S2 +, no rubs, no gallops Respiratory: CTA bilaterally,  no wheezing, no rhonchi Abdominal: Soft, mild RLQ pain, ND, bowel sounds + Extremities: no edema, no cyanosis   The results of significant diagnostics from this hospitalization (including imaging, microbiology, ancillary and laboratory) are listed below for reference.    Significant Diagnostic Studies: Ct Abdomen Pelvis W Contrast  Result Date: 10/22/2017 CLINICAL DATA:  Rectal bleeding for 2 days with abdominal pain EXAM: CT ABDOMEN  AND PELVIS WITH CONTRAST TECHNIQUE: Multidetector CT imaging of the abdomen and pelvis was performed using the standard protocol following bolus administration of intravenous contrast. CONTRAST:  OMNIPAQUE IOHEXOL 300 MG/ML  SOLN COMPARISON:  09/12/2017 ultrasound of the abdomen, 05/02/2017 CT of the abdomen and pelvis. FINDINGS: Lower chest: No acute abnormality. Hepatobiliary: The liver is again somewhat nodular in appearance without focal mass lesion. Portal vein appears patent. Mild ascites is identified. The gallbladder is decompressed with evidence of a tiny gallstone within. Pancreas: Pancreas is well visualized without focal mass lesion. No ductal dilatation is seen. Spleen: Spleen is within normal limits. Adrenals/Urinary Tract: Adrenal glands are unremarkable. The kidneys are well visualize without renal calculi or obstructive change. The bladder is partially distended. Stomach/Bowel: Scattered diverticular change of the colon is noted without definitive diverticulitis. The appendix is within normal limits. No small bowel abnormality is seen. Vascular/Lymphatic: Atherosclerotic changes of the abdominal aorta are noted without aneurysmal dilatation. No venous abnormality is seen. Reproductive: Prostate is within normal limits. Other: Some increase in edematous change within the right pericolic gutter and upper abdomen as well as the perihepatic fat is noted. These changes have increased when compared with the prior exam and are likely related to some progressive portal hypertension in the underlying ascites. Musculoskeletal: Degenerative changes of lumbar spine are noted. IMPRESSION: Changes consistent with cirrhosis of the liver with some progression in the degree of portal hypertension with mild ascites and increasing mesenteric edema. Contracted gallbladder with small gallstone within. Diverticulosis without diverticulitis. Electronically Signed   By: Alcide Clever M.D.   On: 10/22/2017 10:35   US  Venous Img Lower Bilateral  Result Date: 10/22/2017 CLINICAL DATA:  57 year old male with right lower extremity pain and edema/cellulitis for the past 2-3 weeks. EXAM: BILATERAL LOWER EXTREMITY VENOUS DOPPLER ULTRASOUND TECHNIQUE: Gray-scale sonography with graded compression, as well as color Doppler and duplex ultrasound were performed to evaluate the lower extremity deep venous systems from the level of the common femoral vein and including the common femoral, femoral, profunda femoral, popliteal and calf veins including the posterior tibial, peroneal and gastrocnemius veins when visible. The superficial great saphenous vein was also interrogated. Spectral Doppler was utilized to evaluate flow at rest and with distal augmentation maneuvers in the common femoral, femoral and popliteal veins. COMPARISON:  None. FINDINGS: RIGHT LOWER EXTREMITY Common Femoral Vein: No evidence of thrombus. Normal compressibility, respiratory phasicity and response to augmentation. Saphenofemoral Junction: No evidence of thrombus. Normal compressibility and flow on color Doppler imaging. Profunda Femoral Vein: No evidence of thrombus. Normal compressibility and flow on color Doppler imaging. Femoral Vein: No evidence of thrombus. Normal compressibility, respiratory phasicity and response to augmentation. Popliteal Vein: No evidence of thrombus. Normal compressibility, respiratory phasicity and response to augmentation. Calf Veins: No evidence of thrombus. Normal compressibility and flow on color Doppler imaging. Superficial Great Saphenous Vein: No evidence of thrombus. Normal compressibility. Venous Reflux:  None. Other Findings:  None. LEFT LOWER EXTREMITY Common Femoral Vein: No evidence of thrombus. Normal compressibility, respiratory phasicity and response to augmentation. Saphenofemoral Junction: No evidence of thrombus. Normal  compressibility and flow on color Doppler imaging. Profunda Femoral Vein: No evidence of thrombus.  Normal compressibility and flow on color Doppler imaging. Femoral Vein: No evidence of thrombus. Normal compressibility, respiratory phasicity and response to augmentation. Popliteal Vein: No evidence of thrombus. Normal compressibility, respiratory phasicity and response to augmentation. Calf Veins: No evidence of thrombus. Normal compressibility and flow on color Doppler imaging. Superficial Great Saphenous Vein: No evidence of thrombus. Normal compressibility. Venous Reflux:  None. Other Findings:  None. IMPRESSION: No evidence of deep venous thrombosis in either lower extremity. Electronically Signed   By: Malachy Moan M.D.   On: 10/22/2017 16:46     Microbiology: Recent Results (from the past 240 hour(s))  C difficile quick scan w PCR reflex     Status: None   Collection Time: 10/22/17  6:27 AM  Result Value Ref Range Status   C Diff antigen NEGATIVE NEGATIVE Final   C Diff toxin NEGATIVE NEGATIVE Final   C Diff interpretation No C. difficile detected.  Final    Comment: Performed at [facility], [address], [place], [place] [address]     Labs: Basic Metabolic Panel: Recent Labs  Lab 10/22/17 0834 10/23/17 0407  NA 143 141  K 3.5 3.6  CL 112* 111  CO2 25 25  GLUCOSE 103* 78  BUN 8 7  CREATININE 0.84 0.72  CALCIUM 8.8* 8.4*   Liver Function Tests: Recent Labs  Lab 10/22/17 0834 10/23/17 0407  AST 41 38  ALT 25 23  ALKPHOS 154* 129*  BILITOT 3.1* 3.2*  PROT 7.5 6.5  ALBUMIN 3.0* 2.7*   No results for input(s): LIPASE, AMYLASE in the last 168 hours. No results for input(s): AMMONIA in the last 168 hours. CBC: Recent Labs  Lab 10/22/17 0834 10/23/17 0407  WBC 7.4 7.2  HGB 11.8* 10.4*  HCT 35.3* 31.1*  MCV 106.3* 107.2*  PLT 117* 111*   Cardiac Enzymes: No results for input(s): CKTOTAL, CKMB, CKMBINDEX, TROPONINI in the last 168 hours. BNP: Invalid input(s): POCBNP CBG: No results for input(s): GLUCAP in the last 168 hours.  Time coordinating  discharge:  36 minutes  Signed:  Catarina Hartshorn, DO [facility] Pager: [phone] 10/23/2017, 4:10 PM

## 2017-10-23 NOTE — Care Management Obs Status (Signed)
MEDICARE OBSERVATION STATUS NOTIFICATION   Patient Details  Name: Christian Sanders MRN: [id number] Date of Birth: 12/28/1960   Medicare Observation Status Notification Given:  Yes    Malcolm MetroChildress, Junaid Wurzer Demske, RN 10/23/2017, 2:20 PM

## 2017-10-23 NOTE — Progress Notes (Signed)
Subjective:  Patient reports single BM since admission, dark with small amount of red blood on tissue. Missed having labs for [facility]. Wants to go home soon. Asks about transitioning back to oral pain medication. Still having RLQ pain, constant, sometimes up to a 7.   Objective: Vital signs in last 24 hours: Temp:  [98 F (36.7 C)-98.4 F (36.9 C)] 98.4 F (36.9 C) (07/23 0546) Pulse Rate:  [65-81] 81 (07/23 0546) Resp:  [16-18] 18 (07/23 0546) BP: (119-145)/(63-83) 123/63 (07/23 0546) SpO2:  [94 %-100 %] 99 % (07/23 0546) Weight:  [233 lb (105.7 kg)] 233 lb (105.7 kg) (07/22 1323) Last BM Date: 10/22/17 General:   Alert,  Well-developed, well-nourished, pleasant and cooperative in NAD Head:  Normocephalic and atraumatic. Eyes:  Sclera clear, no icterus.  Abdomen:  Soft, mild RLQ tenderness. nondistended. Normal bowel sounds, without guarding, and without rebound.   Extremities:  Without clubbing, deformity. Trace bilateral lower extremity edema. Neurologic:  Alert and  oriented x4;  grossly normal neurologically. Skin:  Intact without significant lesions or rashes. Slight jaundice.  Psych:  Alert and cooperative. Normal mood and affect.  Intake/Output from previous day: 07/22 0701 - 07/23 0700 In: 1360 [P.O.:360; IV Piggyback:1000] Out: -  Intake/Output this shift: No intake/output data recorded.  Lab Results: CBC Recent Labs    10/22/17 0834 10/23/17 0407  WBC 7.4 7.2  HGB 11.8* 10.4*  HCT 35.3* 31.1*  MCV 106.3* 107.2*  PLT 117* 111*   BMET Recent Labs    10/22/17 0834 10/23/17 0407  NA 143 141  K 3.5 3.6  CL 112* 111  CO2 25 25  GLUCOSE 103* 78  BUN 8 7  CREATININE 0.84 0.72  CALCIUM 8.8* 8.4*   LFTs Recent Labs    10/22/17 0834 10/23/17 0407  BILITOT 3.1* 3.2*  ALKPHOS 154* 129*  AST 41 38  ALT 25 23  PROT 7.5 6.5  ALBUMIN 3.0* 2.7*   No results for input(s): LIPASE in the last 72  hours. PT/INR Recent Labs    10/22/17 0834 10/23/17 0407  LABPROT 21.0* 21.9*  INR 1.82 1.93      Imaging Studies: Ct Abdomen Pelvis W Contrast  Result Date: 10/22/2017 CLINICAL DATA:  Rectal bleeding for 2 days with abdominal pain EXAM: CT ABDOMEN AND PELVIS WITH CONTRAST TECHNIQUE: Multidetector CT imaging of the abdomen and pelvis was performed using the standard protocol following bolus administration of intravenous contrast. CONTRAST:  100mL OMNIPAQUE IOHEXOL 300 MG/ML  SOLN COMPARISON:  09/12/2017 ultrasound of the abdomen, 05/02/2017 CT of the abdomen and pelvis. FINDINGS: Lower chest: No acute abnormality. Hepatobiliary: The liver is again somewhat nodular in appearance without focal mass lesion. Portal vein appears patent. Mild ascites is identified. The gallbladder is decompressed with evidence of a tiny gallstone within. Pancreas: Pancreas is well visualized without focal mass lesion. No ductal dilatation is seen. Spleen: Spleen is within normal limits. Adrenals/Urinary Tract: Adrenal glands are unremarkable. The kidneys are well visualize without renal calculi or obstructive change. The bladder is partially distended. Stomach/Bowel: Scattered diverticular change of the colon is noted without definitive diverticulitis. The appendix is within normal limits. No small bowel abnormality is seen. Vascular/Lymphatic: Atherosclerotic changes of the abdominal aorta are noted without aneurysmal dilatation. No venous abnormality is seen. Reproductive: Prostate is within normal limits. Other: Some increase in edematous change within the right pericolic gutter and upper abdomen as well as the perihepatic fat is noted. These changes  have increased when compared with the prior exam and are likely related to some progressive portal hypertension in the underlying ascites. Musculoskeletal: Degenerative changes of lumbar spine are noted. IMPRESSION: Changes consistent with cirrhosis of the liver with some  progression in the degree of portal hypertension with mild ascites and increasing mesenteric edema. Contracted gallbladder with small gallstone within. Diverticulosis without diverticulitis. Electronically Signed   By: Alcide Clever M.D.   On: 10/22/2017 10:35   US Venous Img Lower Bilateral  Result Date: 10/22/2017 CLINICAL DATA:  57 year old male with right lower extremity pain and edema/cellulitis for the past 2-3 weeks. EXAM: BILATERAL LOWER EXTREMITY VENOUS DOPPLER ULTRASOUND TECHNIQUE: Gray-scale sonography with graded compression, as well as color Doppler and duplex ultrasound were performed to evaluate the lower extremity deep venous systems from the level of the common femoral vein and including the common femoral, femoral, profunda femoral, popliteal and calf veins including the posterior tibial, peroneal and gastrocnemius veins when visible. The superficial great saphenous vein was also interrogated. Spectral Doppler was utilized to evaluate flow at rest and with distal augmentation maneuvers in the common femoral, femoral and popliteal veins. COMPARISON:  None. FINDINGS: RIGHT LOWER EXTREMITY Common Femoral Vein: No evidence of thrombus. Normal compressibility, respiratory phasicity and response to augmentation. Saphenofemoral Junction: No evidence of thrombus. Normal compressibility and flow on color Doppler imaging. Profunda Femoral Vein: No evidence of thrombus. Normal compressibility and flow on color Doppler imaging. Femoral Vein: No evidence of thrombus. Normal compressibility, respiratory phasicity and response to augmentation. Popliteal Vein: No evidence of thrombus. Normal compressibility, respiratory phasicity and response to augmentation. Calf Veins: No evidence of thrombus. Normal compressibility and flow on color Doppler imaging. Superficial Great Saphenous Vein: No evidence of thrombus. Normal compressibility. Venous Reflux:  None. Other Findings:  None. LEFT LOWER EXTREMITY Common  Femoral Vein: No evidence of thrombus. Normal compressibility, respiratory phasicity and response to augmentation. Saphenofemoral Junction: No evidence of thrombus. Normal compressibility and flow on color Doppler imaging. Profunda Femoral Vein: No evidence of thrombus. Normal compressibility and flow on color Doppler imaging. Femoral Vein: No evidence of thrombus. Normal compressibility, respiratory phasicity and response to augmentation. Popliteal Vein: No evidence of thrombus. Normal compressibility, respiratory phasicity and response to augmentation. Calf Veins: No evidence of thrombus. Normal compressibility and flow on color Doppler imaging. Superficial Great Saphenous Vein: No evidence of thrombus. Normal compressibility. Venous Reflux:  None. Other Findings:  None. IMPRESSION: No evidence of deep venous thrombosis in either lower extremity. Electronically Signed   By: Malachy Moan M.D.   On: 10/22/2017 16:46  [2 weeks]   Assessment: Very nice 56 year old male known to our service with a history of recently diagnosed cirrhosis likely multifactorial with previous excessive alcohol consumption as well as positive hepatitis C.  He has not had any alcohol since December 2018.  He just finished treatment for hepatitis C at the liver clinic in [place] with Epclusa and ribavirin.  MELD 18. INR up bit more today, received vitamin K 5mg  orally yesterday. I have been in communication with Annamarie Major, NP at [facility] liver care. Patient has an appointment Thursday. She requested obtaining his HCV RNA which patient has not completed this week in preparation for the appointment. She is aware of increasing MELD. Patient's liver status and likely need for transplantation in future has been discussed with him on numerous occasions. Patient needs to stop smoking. He is aware as well.   Presented this admission with RLQ abd pain and hematochezia. He had 5  stools with hematochezia. Stools soft.  His colonoscopy was  last completed in 2016, pancolonic diverticulosis and internal hemorrhoids.  He has had several EGDs in the past 1 to 2 years for variceal screening and banding.  His most recent EGD was completed 09/11/2017 which found 5 columns of nonbleeding grade 1 and grade 2 esophageal varices, moderate portal hypertensive gastropathy, erythematous to adenopathy.    This morning he remains with RLQ abd pain. Nothing on imaging to explain. Mesenteric vasculature looks good as well. Suspected diverticular bleed and may have abdominal pain due to musculoskeletal component in setting of fall as previously outlined.   Plan: 1. HCV RNA drawn at request of [facility]. They are aware of rising MELD. Ongoing discussions with patient regarding candidacy for transplant planned and need for intensive outpatient program for tob/etoh.  2. No endoscopic evaluation needed at this time.  3. Patient wants to try oral pain medications as he desires discharge in near future. Increase to oxycodone 10 mg every 6 hours as needed for moderate to severe pain. Plans for short term use for abdominal wall pain.   Leanna Battles. Dixon Boos [facility] [phone] 7/23/201910:12 AM     LOS: 0 days

## 2017-10-23 NOTE — Progress Notes (Signed)
Discharge instructions reviewed with patient. Given copy of AVS. Oxycodone prescription sent to his pharmacy by MD. Verbalized understanding of instructions and follow-up. Verbalized understanding of signs/symptoms of GI bleeding and when to seek medical attention. No c/o pain or discomfort at time of discharge. Pt left floor in stable condition via w/c accompanied by nurse tech. Earnstine RegalAshley Numair Masden, RN

## 2017-10-23 NOTE — Addendum Note (Signed)
Addended by: Corrie MckusickBOOTH, Ambrosia Wisnewski C on: 10/23/2017 04:29 PM   Modules accepted: Orders

## 2017-10-23 NOTE — Telephone Encounter (Signed)
DISCUSSED WITH PT/WIFE THAT HIS INR IS INCREASING AFTER HEP C Rx. WILL REFER TO [facility] FOR LIVER TRANSPLANT EVALUATION, Dx: CIRRHOSIS. PATIENT/WIFE VOICED THEIR UNDERSTANDING.

## 2017-10-23 NOTE — Telephone Encounter (Signed)
[facility] fax[facility] to [facility].

## 2017-10-23 NOTE — Progress Notes (Addendum)
PROGRESS NOTE  Christian Sanders ZOX:[id number] DOB: [birth year] DOA: 10/22/2017 PCP: Smitty Cords, DO  Brief History:  57 y.o. male with medical history of liver cirrhosis secondary to alcohol and hepatitis C, hypertension, bipolar disorder presenting with 1 day history of hematochezia that began on 10/21/2017.  The patient stated that he had 5 small bowel movements with some maroon type stools on 10/21/2017 with associated right lower quadrant abdominal cramping.  He denies eating any raw or undercooked foods.  He denies any recent travels or sick contacts.  He denies any fevers, chills, chest pain, shortness breath, nausea, vomiting, hematemesis.  He denies taking any NSAIDs.  The patient states that he recently finished a two-week course of doxycycline for lower extremity cellulitis.  He continues to smoke 3 to 4 cigarettes a day with a history of 40 pack years.  Because of worsening abdominal pain and seeing blood on his undergarment, the patient presented to emergency department for further evaluation.  In the emergency department, the patient was afebrile hemodynamically stable saturating 100% on room air.  BMP, LFTs, and CBC were essentially unremarkable except with thrombocytopenia 117,000.  INR was 1.82.  CT of abdomen and pelvis showed a nodular liver with mild ascites and diverticulosis.  There was some mild increase in mesenteric edema.  GI was consulted to assist.   Assessment/Plan: Hematochezia -Concerned about ischemic colitis -hemorrhoidal vs diverticular bleed also possible -full liquids -GI consult appreciated--no plans for endoscopy presently -09/11/2017 EGD nonbleeding grade 1 and 2 esophageal varices, moderate portal hypertensive gastropathy, erythematous to adenopathy. -10/22/17--CT abd--increase in edematous change within the right pericolic gutter and upper abdomen as well as the perihepatic fat is noted   Liver cirrhosis with ascites -Secondary to alcohol and  chronic hepatitis C -Appears compensated presently -Holding furosemide temporarily  Coagulopathy -10/22/17--vitamin K x 1  Thrombocytopenia -due to liver cirrhosis -overall stable  Chronic hepatitis C -Recently finished Epclusa and ribavirn  Bipolar disorder -Patient states that he only intermittently takes Zoloft and [place]  Essential hypertension -Continue amlodipine  Lower extremity pain and edema -Venous duplex--neg  Tobacco abuse I have discussed tobacco cessation with the patient.  I have counseled the patient regarding the negative impacts of continued tobacco use including but not limited to lung cancer, COPD, and cardiovascular disease.  I have discussed alternatives to tobacco and modalities that may help facilitate tobacco cessation including but not limited to biofeedback, hypnosis, and medications.  Total time spent with tobacco counseling was 4 minutes.    Disposition Plan:   Home when cleared by GI Family Communication:   Significant other at bedside updated  Consultants:  [facility]  Code Status:  FULL   DVT Prophylaxis:  SCDs   Procedures: As Listed in Progress Note Above  Antibiotics: None    Subjective: Patient complained of uncontrolled pain with oral oxycodone.  He states that it is better controlled with IV Dilaudid.  Pain is about the same as yesterday.  He had one episode of hematochezia only when he wiped in a.m. 10/23/2017.  He denies any fevers, chills, nausea, vomiting, diarrhea, dysuria, hematuria.  There is no chest pain or shortness of breath.  He noted blood on TP this am, but not in commode after BM  Objective: Vitals:   10/22/17 1323 10/22/17 2034 10/22/17 2209 10/23/17 0546  BP: 140/83  119/70 123/63  Pulse: 69  79 81  Resp: 18  18 18   Temp: 98 F (  36.7 C)  98.2 F (36.8 C) 98.4 F (36.9 C)  TempSrc: Oral  Oral Oral  SpO2: 100% 94% 98% 99%  Weight: 105.7 kg (233 lb)     Height: 6\' 2"  (1.88 m)        Intake/Output Summary (Last 24 hours) at 10/23/2017 0848 Last data filed at 10/22/2017 1900 Gross per 24 hour  Intake 1360 ml  Output -  Net 1360 ml   Weight change:  Exam:   General:  Pt is alert, follows commands appropriately, not in acute distress  HEENT: No icterus, No thrush, No neck mass, [place]/AT  Cardiovascular: RRR, S1/S2, no rubs, no gallops  Respiratory: CTA bilaterally, no wheezing, no crackles, no rhonchi  Abdomen: Soft/+BS, RLQ tender, non distended, no guarding  Extremities: No edema, No lymphangitis, No petechiae, No rashes, no synovitis   Data Reviewed: I have personally reviewed following labs and imaging studies Basic Metabolic Panel: Recent Labs  Lab 10/22/17 0834 10/23/17 0407  NA 143 141  K 3.5 3.6  CL 112* 111  CO2 25 25  GLUCOSE 103* 78  BUN 8 7  CREATININE 0.84 0.72  CALCIUM 8.8* 8.4*   Liver Function Tests: Recent Labs  Lab 10/22/17 0834 10/23/17 0407  AST 41 38  ALT 25 23  ALKPHOS 154* 129*  BILITOT 3.1* 3.2*  PROT 7.5 6.5  ALBUMIN 3.0* 2.7*   No results for input(s): LIPASE, AMYLASE in the last 168 hours. No results for input(s): AMMONIA in the last 168 hours. Coagulation Profile: Recent Labs  Lab 10/22/17 0834 10/23/17 0407  INR 1.82 1.93   CBC: Recent Labs  Lab 10/22/17 0834 10/23/17 0407  WBC 7.4 7.2  HGB 11.8* 10.4*  HCT 35.3* 31.1*  MCV 106.3* 107.2*  PLT 117* 111*   Cardiac Enzymes: No results for input(s): CKTOTAL, CKMB, CKMBINDEX, TROPONINI in the last 168 hours. BNP: Invalid input(s): POCBNP CBG: No results for input(s): GLUCAP in the last 168 hours. HbA1C: No results for input(s): HGBA1C in the last 72 hours. Urine analysis:    Component Value Date/Time   COLORURINE YELLOW 10/22/2017 0752   APPEARANCEUR HAZY (A) 10/22/2017 0752   LABSPEC 1.005 10/22/2017 0752   PHURINE 8.0 10/22/2017 0752   GLUCOSEU NEGATIVE 10/22/2017 0752   HGBUR SMALL (A) 10/22/2017 0752   BILIRUBINUR NEGATIVE  10/22/2017 0752   KETONESUR NEGATIVE 10/22/2017 0752   PROTEINUR NEGATIVE 10/22/2017 0752   NITRITE NEGATIVE 10/22/2017 0752   LEUKOCYTESUR NEGATIVE 10/22/2017 0752   Sepsis Labs: @LABRCNTIP (procalcitonin:4,lacticidven:4) ) Recent Results (from the past 240 hour(s))  C difficile quick scan w PCR reflex     Status: None   Collection Time: 10/22/17  6:27 AM  Result Value Ref Range Status   C Diff antigen NEGATIVE NEGATIVE Final   C Diff toxin NEGATIVE NEGATIVE Final   C Diff interpretation No C. difficile detected.  Final    Comment: Performed at [facility], [address], [place], [place] [address]     Scheduled Meds: . amLODipine  5 mg Oral Daily  . feeding supplement (ENSURE ENLIVE)  237 mL Oral TID BM  . lurasidone  20 mg Oral Daily  . Melatonin  9 mg Oral QHS  . sertraline  50 mg Oral Daily   Continuous Infusions:  Procedures/Studies: Ct Abdomen Pelvis W Contrast  Result Date: 10/22/2017 CLINICAL DATA:  Rectal bleeding for 2 days with abdominal pain EXAM: CT ABDOMEN AND PELVIS WITH CONTRAST TECHNIQUE: Multidetector CT imaging of the abdomen and pelvis was performed  using the standard protocol following bolus administration of intravenous contrast. CONTRAST:  OMNIPAQUE IOHEXOL 300 MG/ML  SOLN COMPARISON:  09/12/2017 ultrasound of the abdomen, 05/02/2017 CT of the abdomen and pelvis. FINDINGS: Lower chest: No acute abnormality. Hepatobiliary: The liver is again somewhat nodular in appearance without focal mass lesion. Portal vein appears patent. Mild ascites is identified. The gallbladder is decompressed with evidence of a tiny gallstone within. Pancreas: Pancreas is well visualized without focal mass lesion. No ductal dilatation is seen. Spleen: Spleen is within normal limits. Adrenals/Urinary Tract: Adrenal glands are unremarkable. The kidneys are well visualize without renal calculi or obstructive change. The bladder is partially distended. Stomach/Bowel: Scattered  diverticular change of the colon is noted without definitive diverticulitis. The appendix is within normal limits. No small bowel abnormality is seen. Vascular/Lymphatic: Atherosclerotic changes of the abdominal aorta are noted without aneurysmal dilatation. No venous abnormality is seen. Reproductive: Prostate is within normal limits. Other: Some increase in edematous change within the right pericolic gutter and upper abdomen as well as the perihepatic fat is noted. These changes have increased when compared with the prior exam and are likely related to some progressive portal hypertension in the underlying ascites. Musculoskeletal: Degenerative changes of lumbar spine are noted. IMPRESSION: Changes consistent with cirrhosis of the liver with some progression in the degree of portal hypertension with mild ascites and increasing mesenteric edema. Contracted gallbladder with small gallstone within. Diverticulosis without diverticulitis. Electronically Signed   By: Alcide Clever M.D.   On: 10/22/2017 10:35   US Venous Img Lower Bilateral  Result Date: 10/22/2017 CLINICAL DATA:  57 year old male with right lower extremity pain and edema/cellulitis for the past 2-3 weeks. EXAM: BILATERAL LOWER EXTREMITY VENOUS DOPPLER ULTRASOUND TECHNIQUE: Gray-scale sonography with graded compression, as well as color Doppler and duplex ultrasound were performed to evaluate the lower extremity deep venous systems from the level of the common femoral vein and including the common femoral, femoral, profunda femoral, popliteal and calf veins including the posterior tibial, peroneal and gastrocnemius veins when visible. The superficial great saphenous vein was also interrogated. Spectral Doppler was utilized to evaluate flow at rest and with distal augmentation maneuvers in the common femoral, femoral and popliteal veins. COMPARISON:  None. FINDINGS: RIGHT LOWER EXTREMITY Common Femoral Vein: No evidence of thrombus. Normal  compressibility, respiratory phasicity and response to augmentation. Saphenofemoral Junction: No evidence of thrombus. Normal compressibility and flow on color Doppler imaging. Profunda Femoral Vein: No evidence of thrombus. Normal compressibility and flow on color Doppler imaging. Femoral Vein: No evidence of thrombus. Normal compressibility, respiratory phasicity and response to augmentation. Popliteal Vein: No evidence of thrombus. Normal compressibility, respiratory phasicity and response to augmentation. Calf Veins: No evidence of thrombus. Normal compressibility and flow on color Doppler imaging. Superficial Great Saphenous Vein: No evidence of thrombus. Normal compressibility. Venous Reflux:  None. Other Findings:  None. LEFT LOWER EXTREMITY Common Femoral Vein: No evidence of thrombus. Normal compressibility, respiratory phasicity and response to augmentation. Saphenofemoral Junction: No evidence of thrombus. Normal compressibility and flow on color Doppler imaging. Profunda Femoral Vein: No evidence of thrombus. Normal compressibility and flow on color Doppler imaging. Femoral Vein: No evidence of thrombus. Normal compressibility, respiratory phasicity and response to augmentation. Popliteal Vein: No evidence of thrombus. Normal compressibility, respiratory phasicity and response to augmentation. Calf Veins: No evidence of thrombus. Normal compressibility and flow on color Doppler imaging. Superficial Great Saphenous Vein: No evidence of thrombus. Normal compressibility. Venous Reflux:  None. Other Findings:  None. IMPRESSION:  No evidence of deep venous thrombosis in either lower extremity. Electronically Signed   By: Malachy MoanHeath  McCullough M.D.   On: 10/22/2017 16:46    Catarina Hartshornavid Apurva Reily, DO  [facility] Pager [phone]  If 7PM-7AM, please contact night-coverage [email or web address] Password [email or web address] 10/23/2017, 8:48 AM   LOS: 0 days

## 2017-10-24 ENCOUNTER — Telehealth: Payer: Self-pay

## 2017-10-24 LAB — HCV RNA QUANT: HCV Quantitative: NOT DETECTED [IU]/mL (ref >50)

## 2017-10-24 NOTE — Telephone Encounter (Signed)
Transition Care Management Follow-up Telephone Call  How have you been since you were released from the hospital? "doing okay today "  Do you understand why you were in the hospital? yes  Do you have a copy of your discharge instructions Yes Do you understand the discharge instrcutions? yes  Where were you discharged to? Home  Do you have support at home? Yes    Items Reviewed:  Medications obtained Yes  Medications reviewed: Yes  Dietary changes reviewed: yes  Home Health? No  DME ordered at discharge obtained? NA,  Medical supplies: NA    Functional Questionnaire:   Activities of Daily Living (ADLs):   He states they are independent in the following: ambulation, bathing and hygiene, feeding, continence, grooming, toileting, dressing and medication management States they require assistance with the following: none  Any transportation issues/concerns?: no  Any patient concerns? no  Confirmed importance and date/time of follow-up visits scheduled with PCP: yes 11/05/2017 at 2:20pm per pt request   Confirm appointment scheduled with specialist? Yes, patient following up with GI   Confirmed with patient if condition begins to worsen call PCP or If it's emergency go to the ER.

## 2017-10-25 ENCOUNTER — Other Ambulatory Visit: Payer: Self-pay | Admitting: Family Medicine

## 2017-10-25 DIAGNOSIS — B182 Chronic viral hepatitis C: Secondary | ICD-10-CM | POA: Diagnosis not present

## 2017-10-25 DIAGNOSIS — J3089 Other allergic rhinitis: Secondary | ICD-10-CM

## 2017-10-25 DIAGNOSIS — K7469 Other cirrhosis of liver: Secondary | ICD-10-CM | POA: Diagnosis not present

## 2017-10-29 ENCOUNTER — Emergency Department (HOSPITAL_COMMUNITY)
Admission: EM | Admit: 2017-10-29 | Discharge: 2017-10-29 | Disposition: A | Discharge status: Home / Self Care | Payer: Medicare HMO | Attending: Emergency Medicine | Admitting: Emergency Medicine

## 2017-10-29 ENCOUNTER — Emergency Department (HOSPITAL_COMMUNITY): Payer: Medicare HMO

## 2017-10-29 ENCOUNTER — Encounter (HOSPITAL_COMMUNITY): Payer: Self-pay

## 2017-10-29 ENCOUNTER — Other Ambulatory Visit: Payer: Self-pay

## 2017-10-29 DIAGNOSIS — F17210 Nicotine dependence, cigarettes, uncomplicated: Secondary | ICD-10-CM | POA: Diagnosis not present

## 2017-10-29 DIAGNOSIS — R69 Illness, unspecified: Secondary | ICD-10-CM | POA: Diagnosis not present

## 2017-10-29 DIAGNOSIS — I8500 Esophageal varices without bleeding: Secondary | ICD-10-CM | POA: Diagnosis not present

## 2017-10-29 DIAGNOSIS — Z79899 Other long term (current) drug therapy: Secondary | ICD-10-CM | POA: Insufficient documentation

## 2017-10-29 DIAGNOSIS — R609 Edema, unspecified: Secondary | ICD-10-CM

## 2017-10-29 DIAGNOSIS — M79605 Pain in left leg: Secondary | ICD-10-CM | POA: Diagnosis present

## 2017-10-29 DIAGNOSIS — R600 Localized edema: Secondary | ICD-10-CM | POA: Diagnosis not present

## 2017-10-29 DIAGNOSIS — I10 Essential (primary) hypertension: Secondary | ICD-10-CM | POA: Insufficient documentation

## 2017-10-29 LAB — CBC WITH DIFFERENTIAL/PLATELET
BASOS ABS: 0 10*3/uL (ref 0.0–0.1)
Basophils Relative: 0 %
EOS PCT: 3 %
Eosinophils Absolute: 0.2 10*3/uL (ref 0.0–0.7)
HEMATOCRIT: 33 % — AB (ref 39.0–52.0)
Hemoglobin: 11.2 g/dL — ABNORMAL LOW (ref 13.0–17.0)
LYMPHS PCT: 31 %
Lymphs Abs: 2.1 10*3/uL (ref 0.7–4.0)
MCH: 35.9 pg — AB (ref 26.0–34.0)
MCHC: 33.9 g/dL (ref 30.0–36.0)
MCV: 105.8 fL — AB (ref 78.0–100.0)
Monocytes Absolute: 0.7 10*3/uL (ref 0.1–1.0)
Monocytes Relative: 11 %
NEUTROS ABS: 3.8 10*3/uL (ref 1.7–7.7)
Neutrophils Relative %: 55 %
PLATELETS: 90 10*3/uL — AB (ref 150–400)
RBC: 3.12 MIL/uL — AB (ref 4.22–5.81)
RDW: 15.1 % (ref 11.5–15.5)
WBC: 6.8 10*3/uL (ref 4.0–10.5)

## 2017-10-29 LAB — COMPREHENSIVE METABOLIC PANEL
ALK PHOS: 155 U/L — AB (ref 38–126)
ALT: 22 U/L (ref 0–44)
AST: 39 U/L (ref 15–41)
Albumin: 3 g/dL — ABNORMAL LOW (ref 3.5–5.0)
Anion gap: 4 — ABNORMAL LOW (ref 5–15)
BILIRUBIN TOTAL: 2.9 mg/dL — AB (ref 0.3–1.2)
BUN: 8 mg/dL (ref 6–20)
CALCIUM: 8.8 mg/dL — AB (ref 8.9–10.3)
CHLORIDE: 113 mmol/L — AB (ref 98–111)
CO2: 25 mmol/L (ref 22–32)
CREATININE: 0.77 mg/dL (ref 0.61–1.24)
Glucose, Bld: 92 mg/dL (ref 70–99)
Potassium: 3.6 mmol/L (ref 3.5–5.1)
Sodium: 142 mmol/L (ref 135–145)
TOTAL PROTEIN: 7.1 g/dL (ref 6.5–8.1)

## 2017-10-29 LAB — BRAIN NATRIURETIC PEPTIDE: B NATRIURETIC PEPTIDE 5: 84 pg/mL (ref 0.0–100.0)

## 2017-10-29 LAB — CK: CK TOTAL: 77 U/L (ref 49–397)

## 2017-10-29 MED ORDER — FUROSEMIDE 10 MG/ML IJ SOLN: 40.0000 mg | Freq: Once | INTRAMUSCULAR | Status: DC

## 2017-10-29 MED ORDER — FUROSEMIDE 10 MG/ML IJ SOLN
40.0000 mg | Freq: Once | INTRAMUSCULAR | Status: AC
  Administered 2017-10-29: 40 mg via INTRAMUSCULAR
  Filled 2017-10-29: qty 4

## 2017-10-29 MED ORDER — OXYCODONE HCL 5 MG PO TABS
10.0000 mg | ORAL_TABLET | Freq: Once | ORAL | Status: AC
Start: 2017-10-29 — End: 2017-10-29
  Administered 2017-10-29: 10 mg via ORAL
  Filled 2017-10-29: qty 2

## 2017-10-29 NOTE — ED Provider Notes (Signed)
[facility] EMERGENCY DEPARTMENT Provider Note   CSN: [id number] Arrival date & time: 10/29/17  0015     History   Chief Complaint Chief Complaint  Patient presents with  . Leg Pain    bilateral    HPI Christian Sanders is a 57 y.o. male.  Patient with history of end-stage liver disease, cirrhosis and varices presenting with bilateral lower extremity pain and swelling.  States this is been going on for the past 2 months but became acutely more painful tonight.  Describes shooting pain shooting up from his ankle to his thighs bilaterally that comes and goes.  He believes his swelling is worse despite taking his Lasix at home as prescribed.  He denies any history of diabetes.  He was admitted to the hospital with GI bleeding which is since improved.  There is been no redness or fever.  He takes oxycodone for the pain on an as-needed basis but this is not 1 of his regular medications.  He denies any falls or trauma to his legs.  While he was in the hospital last week he had negative Dopplers that showed no clots.  He denies any chest pain or shortness of breath.  He denies any fevers, chills, nausea, vomiting or diarrhea.  The history is provided by the patient.  Leg Pain      Past Medical History:  Diagnosis Date  . Abnormal CT of liver 12/20/2016  . Anxiety   . Bipolar disorder (HCC)   . Dentures complicating chewing    full upper, partial lower  . Depression   . Esophageal varices determined by endoscopy (HCC)   . Gastritis and duodenitis   . GERD (gastroesophageal reflux disease)   . Headache    chronic, s/p facial injury and reconstruction  . Hypertension   . Portal hypertensive gastropathy (HCC)   . PTSD (post-traumatic stress disorder)   . Vertebral column disorder    2 crushed thoracic vertebra    Patient Active Problem List   Diagnosis Date Noted  . Alcoholic cirrhosis of liver with ascites (HCC) 10/23/2017  . Coagulopathy (HCC) 10/23/2017  . Hematochezia  10/22/2017  . Acute GI bleeding   . Lower abdominal pain   . Anemia   . Thrombocytopenia (HCC)   . History of alcohol abuse   . Hepatitis C 09/06/2017  . Mild episode of recurrent major depressive disorder (HCC) 06/13/2017  . Panic disorder 06/13/2017  . Alcohol use disorder, severe, in early remission (HCC) 06/13/2017  . Esophageal varices in alcoholic cirrhosis (HCC)   . Depression 05/08/2017  . Varices of esophagus determined by endoscopy (HCC)   . Gastritis and duodenitis   . Splenomegaly 03/08/2017  . Hepatic cirrhosis (HCC) 12/27/2016  . RUQ pain 12/27/2016  . Portal hypertension (HCC) 12/27/2016  . Elevated liver enzymes 03/02/2016  . BPH without urinary obstruction 02/28/2016  . ED (erectile dysfunction) 12/28/2015  . SK (seborrheic keratosis) 12/28/2015  . Chronic headaches 02/09/2015  . Tobacco abuse 02/09/2015  . Hypertension 11/23/2014  . PTSD (post-traumatic stress disorder) 11/23/2014  . Special screening for malignant neoplasms, colon   . Chronic mid back pain 12/16/2013  . Closed compression fracture of thoracic vertebra (HCC) 09/05/2013    Past Surgical History:  Procedure Laterality Date  . BIOPSY  04/17/2017   Procedure: BIOPSY;  Surgeon: West Bali, MD;  Location: [facility];  Service: Endoscopy;;  gastric for h pylori  . COLONOSCOPY WITH PROPOFOL N/A 10/09/2014   Procedure: COLONOSCOPY WITH PROPOFOL;  Surgeon: Midge Minium, MD;  Location: [facility];  Service: Endoscopy;  Laterality: N/A;  . ESOPHAGEAL BANDING N/A 04/17/2017   Procedure: ESOPHAGEAL BANDING;  Surgeon: West Bali, MD;  Location: [facility];  Service: Endoscopy;  Laterality: N/A;  . ESOPHAGEAL BANDING N/A 05/22/2017   Procedure: ESOPHAGEAL BANDING;  Surgeon: West Bali, MD;  Location: [facility];  Service: Endoscopy;  Laterality: N/A;  . ESOPHAGEAL BANDING N/A 06/26/2017   Procedure: ESOPHAGEAL BANDING;  Surgeon: West Bali, MD;  Location: [facility];   Service: Endoscopy;  Laterality: N/A;  . ESOPHAGOGASTRODUODENOSCOPY (EGD) WITH PROPOFOL N/A 04/17/2017   Procedure: ESOPHAGOGASTRODUODENOSCOPY (EGD) WITH PROPOFOL;  Surgeon: West Bali, MD;  [facility]: [facility];  Service: Endoscopy;  Laterality: N/A;  10:45am  . ESOPHAGOGASTRODUODENOSCOPY (EGD) WITH PROPOFOL N/A 05/22/2017   Procedure: ESOPHAGOGASTRODUODENOSCOPY (EGD) WITH PROPOFOL;  Surgeon: West Bali, MD;  [facility];  Service: Endoscopy;  Laterality: N/A;  . ESOPHAGOGASTRODUODENOSCOPY (EGD) WITH PROPOFOL N/A 06/26/2017   Procedure: ESOPHAGOGASTRODUODENOSCOPY (EGD) WITH PROPOFOL;  Surgeon: West Bali, MD;  [facility]: [facility];  Service: Endoscopy;  Laterality: N/A;  1:45pm  . ESOPHAGOGASTRODUODENOSCOPY (EGD) WITH PROPOFOL N/A 09/11/2017   Procedure: ESOPHAGOGASTRODUODENOSCOPY (EGD) WITH PROPOFOL;  Surgeon: West Bali, MD;  Location: [facility];  Service: Endoscopy;  Laterality: N/A;  10:30am  . FACIAL RECONSTRUCTION SURGERY     eye socket with 2 metal plates        Home Medications    Prior to Admission medications   Medication Sig Start Date End Date Taking? Authorizing Provider  ALPRAZolam Prudy Feeler) 0.5 MG tablet Take 0.5-1 tablets (0.25-0.5 mg total) by mouth 2 (two) times daily as needed for anxiety. 10/18/17   Neysa Hotter, MD  amLODipine (NORVASC) 5 MG tablet Take 1 tablet (5 mg total) by mouth daily. 05/08/17   Karamalegos, Netta Neat, DO  fluticasone (FLONASE) 50 MCG/ACT nasal spray PLACE 2 SPRAYS INTO BOTH NOSTRILS DAILY AS NEEDED FOR ALLERGIES. 10/25/17   Althea Charon, Netta Neat, DO  furosemide (LASIX) 20 MG tablet Take 1 tablet (20 mg total) by mouth daily. 09/07/17   Anice Paganini, NP  gabapentin (NEURONTIN) 100 MG capsule Start 1 capsule daily, increase by 1 cap every 2-3 days as tolerated up to 3 times a day, or may take 3 at once in evening. Patient taking differently: Take 100-200 mg by mouth 3 (three) times daily as needed (for pain.).   06/05/17   Karamalegos, Netta Neat, DO  lurasidone (LATUDA) 20 MG TABS tablet Take 1 tablet (20 mg total) by mouth daily. 10/10/17   Neysa Hotter, MD  Melatonin 10 MG TABS Take 30 mg by mouth at bedtime.    [provider]  ondansetron (ZOFRAN-ODT) 4 MG disintegrating tablet Take 4 mg by mouth every 8 (eight) hours as needed (for nausea/vomiting.).  08/14/17   [provider]  oxyCODONE 10 MG TABS Take 1 tablet (10 mg total) by mouth every 6 (six) hours as needed for moderate pain. 10/23/17   Catarina Hartshorn, MD  sertraline (ZOLOFT) 50 MG tablet Take 1 tablet (50 mg total) by mouth daily. 10/10/17   Neysa Hotter, MD  sildenafil (REVATIO) 20 MG tablet TAKE 1 TO 5 TABLETS BY MOUTH ONCE DAILY AS NEEDED Patient taking differently: Take 20-100 mg by mouth as needed (for ED).  05/08/17   Smitty Cords, DO    Family History Family History  Problem Relation Age of Onset  . Leukemia  Father   . Colon cancer Neg Hx   . Liver disease Neg Hx     Social History Social History   Tobacco Use  . Smoking status: Current Every Day Smoker    Packs/day: 0.25    Years: 30.00    Pack years: 7.50    Types: Cigarettes  . Smokeless tobacco: Never Used  Substance Use Topics  . Alcohol use: No    Alcohol/week: 3.6 oz    Types: 6 Cans of beer per week    Frequency: Never    Comment: None as of 03/08/17; previously 1-1.5 cases of beer a day in his 31s.  . Drug use: No     Allergies   Bee venom; Ibuprofen; Pineapple; Pollen extract; and Tylenol [acetaminophen]   Review of Systems Review of Systems  Constitutional: Negative for activity change, appetite change and fever.  HENT: Negative for congestion, ear discharge, facial swelling and sinus pressure.   Eyes: Negative for visual disturbance.  Respiratory: Negative for cough, chest tightness and shortness of breath.   Cardiovascular: Positive for leg swelling. Negative for chest pain.  Gastrointestinal: Negative for abdominal  distention, abdominal pain, nausea and vomiting.  Genitourinary: Negative for dysuria and hematuria.  Musculoskeletal: Positive for arthralgias and myalgias.  Skin: Negative for rash.  Neurological: Negative for dizziness, weakness and headaches.   all other systems are negative except as noted in the HPI and PMH.     Physical Exam Updated Vital Signs BP 137/88 (BP Location: Left Arm)   Pulse 80   Temp 98 F (36.7 C) (Oral)   Resp 18   Ht 6\' 2"  (1.88 m)   Wt 105.7 kg (233 lb)   SpO2 99%   BMI 29.92 kg/m   Physical Exam  Constitutional: He is oriented to person, place, and time. He appears well-developed and well-nourished. No distress.  Chronically ill appearing  HENT:  Head: Normocephalic and atraumatic.  Mouth/Throat: Oropharynx is clear and moist. No oropharyngeal exudate.  Eyes: Pupils are equal, round, and reactive to light. Conjunctivae and EOM are normal.  Neck: Normal range of motion. Neck supple.  No meningismus.  Cardiovascular: Normal rate, regular rhythm, normal heart sounds and intact distal pulses.  No murmur heard. Pulmonary/Chest: Effort normal and breath sounds normal. No respiratory distress.  Abdominal: Soft. There is no tenderness. There is no rebound and no guarding.  Musculoskeletal: Normal range of motion. He exhibits edema and tenderness.  +2 pretibial edema bilaterally.  Intact DP and PT pulses bilaterally, compartments soft.  There is no erythema full range of motion of ankles and knees.   Neurological: He is alert and oriented to person, place, and time. No cranial nerve deficit. He exhibits normal muscle tone. Coordination normal.  No ataxia on finger to nose bilaterally. No pronator drift. 5/5 strength throughout. CN 2-12 intact.Equal grip strength. Sensation intact.   Skin: Skin is warm.  Psychiatric: He has a normal mood and affect. His behavior is normal.  Nursing note and vitals reviewed.    ED Treatments / Results  Labs (all labs  ordered are listed, but only abnormal results are displayed) Labs Reviewed  CBC WITH DIFFERENTIAL/PLATELET - Abnormal; Notable for the following components:      Result Value   RBC 3.12 (*)    Hemoglobin 11.2 (*)    HCT 33.0 (*)    MCV 105.8 (*)    MCH 35.9 (*)    Platelets 90 (*)    All other components within normal limits  COMPREHENSIVE METABOLIC PANEL - Abnormal; Notable for the following components:   Chloride 113 (*)    Calcium 8.8 (*)    Albumin 3.0 (*)    Alkaline Phosphatase 155 (*)    Total Bilirubin 2.9 (*)    Anion gap 4 (*)    All other components within normal limits  BRAIN NATRIURETIC PEPTIDE  CK    EKG None  Radiology Dg Chest 2 View  Result Date: 10/29/2017 CLINICAL DATA:  Lower extremity edema EXAM: CHEST - 2 VIEW COMPARISON:  08/21/2017 FINDINGS: Lungs are clear. No pleural effusion or pneumothorax. No frank interstitial edema. The heart is normal in size. Moderate compression fracture deformity of a midthoracic vertebral body, chronic. IMPRESSION: No evidence of acute cardiopulmonary disease. Electronically Signed   By: Charline BillsSriyesh  Krishnan M.D.   On: 10/29/2017 01:38    Procedures Procedures (including critical care time)  Medications Ordered in ED Medications  oxyCODONE (Oxy IR/ROXICODONE) immediate release tablet 10 mg (has no administration in time range)  furosemide (LASIX) injection 40 mg (has no administration in time range)     Initial Impression / Assessment and Plan / ED Course  I have reviewed the triage vital signs and the nursing notes.  Pertinent labs & imaging results that were available during my care of the patient were reviewed by me and considered in my medical decision making (see chart for details).    Patient with liver disease and chronic leg swelling presenting with worsening swelling and pain.  Neurovascularly intact.  No signs of infection.  Patient given IV Lasix.  Dopplers from July 22 are negative.  Patient's labs are at  baseline with stable hemoglobin, LFTs and creatinine.  There is no evidence of cellulitis.  No evidence of neurovascular compromise.  Intact distal pulses.  Soft compartments.  [employer] [place] narcotic database reviewed.  Patient received 12 oxycodone 10 mg on 7/23 and 15 oxycodone 5 mg on 7/11.  Patient given IV Lasix in the ED.  Chest x-ray shows no pulmonary edema.  He is breathing comfortably with no hypoxia.  Discussed with patient that medications for chronic pain cannot be provided from the ED. Discussed with patient is to continue his medications and follow-up with his PCP.  Return precautions discussed.  Final Clinical Impressions(s) / ED Diagnoses   Final diagnoses:  Peripheral edema    ED Discharge Orders    None       Leeanna Slaby, Jeannett SeniorStephen, MD 10/29/17 [phone]

## 2017-10-29 NOTE — ED Triage Notes (Signed)
Pt reports pain to bilateral LE x 2 months, worse in the past month. Pt reports edema to ankles and feet. Reports taking Lasix as prescribed. Reports he ran out of oxycodone 10 mg a couple of days ago.

## 2017-10-29 NOTE — Progress Notes (Signed)
CC'D TO PCP °

## 2017-10-29 NOTE — Discharge Instructions (Addendum)
Your blood work appears to be stable.  Continue your medications as prescribed and follow-up with your primary doctor and your GI doctor.  Return to the ED if you develop new or worsening symptoms.

## 2017-10-31 ENCOUNTER — Other Ambulatory Visit: Payer: Self-pay

## 2017-10-31 NOTE — Patient Outreach (Signed)
[facility]) Care Management  10/31/2017  Lawerance CruelKarl Neddo 12/20/1960 [id number]    Telephone Screen Referral Date :10/30/2017 Referral Source:[facility] ED Census Referral Reason:ED Utilization Insurance:[employer]    Outreach attempt # 1 To patient. No answer.  Phone rand six times with no answering machine pickup unable to leave voice message.   Plan: RN Health Coach will send letter. RN Health Coach will make another attempt to the patient within four business days.  Juanell Fairlyraci Chrystine Frogge RN, BSN, [facility] RN Health Coach Disease Management [facility] Direct Dial:  [phone] Fax: [phone]

## 2017-11-05 ENCOUNTER — Ambulatory Visit (INDEPENDENT_AMBULATORY_CARE_PROVIDER_SITE_OTHER): Payer: Medicare HMO | Admitting: Family Medicine

## 2017-11-05 ENCOUNTER — Encounter: Payer: Self-pay | Admitting: Family Medicine

## 2017-11-05 VITALS — BP 130/66 | HR 79 | Temp 98.3°F | Resp 16 | Ht 74.0 in | Wt 248.0 lb

## 2017-11-05 DIAGNOSIS — G8929 Other chronic pain: Secondary | ICD-10-CM | POA: Insufficient documentation

## 2017-11-05 DIAGNOSIS — G894 Chronic pain syndrome: Secondary | ICD-10-CM

## 2017-11-05 DIAGNOSIS — R519 Headache, unspecified: Secondary | ICD-10-CM

## 2017-11-05 DIAGNOSIS — M792 Neuralgia and neuritis, unspecified: Secondary | ICD-10-CM

## 2017-11-05 DIAGNOSIS — R51 Headache: Secondary | ICD-10-CM

## 2017-11-05 DIAGNOSIS — G6289 Other specified polyneuropathies: Secondary | ICD-10-CM

## 2017-11-05 DIAGNOSIS — R110 Nausea: Secondary | ICD-10-CM | POA: Diagnosis not present

## 2017-11-05 DIAGNOSIS — R600 Localized edema: Secondary | ICD-10-CM | POA: Diagnosis not present

## 2017-11-05 DIAGNOSIS — K7031 Alcoholic cirrhosis of liver with ascites: Secondary | ICD-10-CM | POA: Diagnosis not present

## 2017-11-05 DIAGNOSIS — R69 Illness, unspecified: Secondary | ICD-10-CM | POA: Diagnosis not present

## 2017-11-05 MED ORDER — GABAPENTIN 300 MG PO CAPS: 300.0000 mg | ORAL_CAPSULE | Freq: Three times a day (TID) | ORAL | 3 refills | Status: DC

## 2017-11-05 MED ORDER — ONDANSETRON 4 MG PO TBDP: 4.0000 mg | ORAL_TABLET | Freq: Three times a day (TID) | ORAL | 2 refills | Status: DC | PRN

## 2017-11-05 NOTE — Progress Notes (Addendum)
Subjective:    Patient ID: Christian Sanders, male    DOB: October 16, 1960, 57 y.o.   MRN: [id number]  Christian Sanders is a 57 y.o. male presenting on 11/05/2017 for Hospitalization Follow-up (edema)   HPI   ED FOLLOW-UP VISIT  Hospital/Location: [facility] ED Date of ED Visit: 10/29/17  Reason for Presenting to ED: Edema LE Primary (+Secondary) Diagnosis: Peripheral Edema, Cirrhosis  FOLLOW-UP - ED provider note and record have been reviewed - Patient presents today about 7 days after recent ED visit. Brief summary of recent course, patient had symptoms of edema for few months prior with worsening in bilateral lower extremity, presented to ED, testing in ED with with labs, unremarkable mostly, he recently had admission with LE Dopplers done that ruled out DVT, treated with IV Lasix 40mg  x 1 dose, check CXR to rule out pulm edema. Had pain and legs and history of neuropathy was given oxycodone with some relief, using crutches  - Today reports overall has not improved dramatically after discharge symptoms of LE edema have persisted along with pain. He was seen by his established GI physician back 2-3 months ago and was considering adding Spironlactone to regimen with diuretic lasix. He was waiting to hear back from them. - Tried elevation, limited improvement.  - New medications on discharge: Oxycodone (previously given by Orthopedics)  Admits leg pain, sharp stabbing radiating, history of neuropathy Denies erythema, fever chills  FOR A&P I have reviewed the discharge medication list, and have reconciled the current and discharge medications today.   I have reviewed the discharge medication list, and have reconciled the current and discharge medications today.   Current Outpatient Medications:  .  ALPRAZolam (XANAX) 0.5 MG tablet, Take 0.5-1 tablets (0.25-0.5 mg total) by mouth 2 (two) times daily as needed for anxiety., Disp: 60 tablet, Rfl: 2 .  amLODipine (NORVASC) 5 MG tablet, Take 1  tablet (5 mg total) by mouth daily., Disp: 30 tablet, Rfl: 5 .  fluticasone (FLONASE) 50 MCG/ACT nasal spray, PLACE 2 SPRAYS INTO BOTH NOSTRILS DAILY AS NEEDED FOR ALLERGIES., Disp: 48 g, Rfl: 1 .  furosemide (LASIX) 20 MG tablet, Take 1 tablet (20 mg total) by mouth daily., Disp: 30 tablet, Rfl: 3 .  gabapentin (NEURONTIN) 300 MG capsule, Take 1 capsule (300 mg total) by mouth 3 (three) times daily., Disp: 90 capsule, Rfl: 3 .  lurasidone (LATUDA) 20 MG TABS tablet, Take 1 tablet (20 mg total) by mouth daily., Disp: 90 tablet, Rfl: 0 .  Melatonin 10 MG TABS, Take 30 mg by mouth at bedtime., Disp: , Rfl:  .  omeprazole (PRILOSEC) 20 MG capsule, TAKE 1 CAPSULE BY MOUTH 30 MINUTES PRIOR TO FIRST MEAL, Disp: , Rfl: 11 .  ondansetron (ZOFRAN-ODT) 4 MG disintegrating tablet, Take 1 tablet (4 mg total) by mouth every 8 (eight) hours as needed (for nausea/vomiting.)., Disp: 90 tablet, Rfl: 2 .  sertraline (ZOLOFT) 50 MG tablet, Take 1 tablet (50 mg total) by mouth daily., Disp: 90 tablet, Rfl: 0 .  sildenafil (REVATIO) 20 MG tablet, TAKE 1 TO 5 TABLETS BY MOUTH ONCE DAILY AS NEEDED (Patient taking differently: Take 20-100 mg by mouth as needed (for ED). ), Disp: 50 tablet, Rfl: 5  Depression screen [facility] 2/9 11/06/2017 05/08/2017 06/03/2015  Decreased Interest 3 0 0  Down, Depressed, Hopeless 2 0 0  PHQ - 2 Score 5 0 0  Altered sleeping 2 0 -  Tired, decreased energy 1 1 -  Change in appetite 3 0 -  Feeling bad or failure about yourself  0 0 -  Trouble concentrating 2 1 -  Moving slowly or fidgety/restless 1 0 -  Suicidal thoughts 2 0 -  PHQ-9 Score 16 2 -  Difficult doing work/chores Somewhat difficult Not difficult at all -   Followed by Psychiatry  [place]-Suicide Severity Rating Scale 1) Have you wished you were dead or wished you could go to sleep and not wake up? - Yes  2) Have you had any actual thoughts of killing yourself? - No  Skip questions 3,4, 5  6) Have you ever done anything,  started to do anything, or prepared to do anything to end your life? - No  ------------------------------------------------------------------------- Social History   Tobacco Use  . Smoking status: Current Every Day Smoker    Packs/day: 0.25    Years: 30.00    Pack years: 7.50    Types: Cigarettes  . Smokeless tobacco: Never Used  Substance Use Topics  . Alcohol use: No    Alcohol/week: 3.6 oz    Types: 6 Cans of beer per week    Frequency: Never    Comment: None as of 03/08/17; previously 1-1.5 cases of beer a day in his 7420s.  . Drug use: No    Review of Systems Per HPI unless specifically indicated above     Objective:    BP 130/66   Pulse 79   Temp 98.3 F (36.8 C) (Oral)   Resp 16   Ht 6\' 2"  (1.88 m)   Wt 248 lb (112.5 kg)   BMI 31.84 kg/m   Wt Readings from Last 3 Encounters:  11/05/17 248 lb (112.5 kg)  10/29/17 233 lb (105.7 kg)  10/22/17 233 lb (105.7 kg)    Physical Exam  Constitutional: He is oriented to person, place, and time. He appears well-developed and well-nourished. No distress.  Well-appearing, uncomfortable, cooperative  HENT:  Head: Normocephalic and atraumatic.  Mouth/Throat: Oropharynx is clear and moist.  Eyes: Conjunctivae are normal. Right eye exhibits no discharge. Left eye exhibits no discharge.  Neck: Normal range of motion. Neck supple. No thyromegaly present.  Cardiovascular: Normal rate, regular rhythm, normal heart sounds and intact distal pulses.  No murmur heard. Pulmonary/Chest: Effort normal and breath sounds normal. No respiratory distress. He has no wheezes. He has no rales.  Musculoskeletal: Normal range of motion. He exhibits edema (bilateral pitting edema lower extremity).  Using crutches  Lymphadenopathy:    He has no cervical adenopathy.  Neurological: He is alert and oriented to person, place, and time.  Skin: Skin is warm and dry. No rash noted. He is not diaphoretic. No erythema.  Psychiatric: He has a normal mood  and affect. His behavior is normal.  Well groomed, good eye contact, normal speech and thoughts  Nursing note and vitals reviewed.     Results for orders placed or performed during the hospital encounter of 10/29/17  CBC with Differential/Platelet  Result Value Ref Range   WBC 6.8 4.0 - 10.5 K/uL   RBC 3.12 (L) 4.22 - 5.81 MIL/uL   Hemoglobin 11.2 (L) 13.0 - 17.0 g/dL   HCT 16.133.0 (L) 09.639.0 - 04.552.0 %   MCV 105.8 (H) 78.0 - 100.0 fL   MCH 35.9 (H) 26.0 - 34.0 pg   MCHC 33.9 30.0 - 36.0 g/dL   RDW 40.915.1 81.111.5 - 91.415.5 %   Platelets 90 (L) 150 - 400 K/uL   Neutrophils Relative % 55 %   Neutro Abs 3.8 1.7 -  7.7 K/uL   Lymphocytes Relative 31 %   Lymphs Abs 2.1 0.7 - 4.0 K/uL   Monocytes Relative 11 %   Monocytes Absolute 0.7 0.1 - 1.0 K/uL   Eosinophils Relative 3 %   Eosinophils Absolute 0.2 0.0 - 0.7 K/uL   Basophils Relative 0 %   Basophils Absolute 0.0 0.0 - 0.1 K/uL  Comprehensive metabolic panel  Result Value Ref Range   Sodium 142 135 - 145 mmol/L   Potassium 3.6 3.5 - 5.1 mmol/L   Chloride 113 (H) 98 - 111 mmol/L   CO2 25 22 - 32 mmol/L   Glucose, Bld 92 70 - 99 mg/dL   BUN 8 6 - 20 mg/dL   Creatinine, Ser 2.95 0.61 - 1.24 mg/dL   Calcium 8.8 (L) 8.9 - 10.3 mg/dL   Total Protein 7.1 6.5 - 8.1 g/dL   Albumin 3.0 (L) 3.5 - 5.0 g/dL   AST 39 15 - 41 U/L   ALT 22 0 - 44 U/L   Alkaline Phosphatase 155 (H) 38 - 126 U/L   Total Bilirubin 2.9 (H) 0.3 - 1.2 mg/dL   GFR calc non Af Amer >60 >60 mL/min   GFR calc Af Amer >60 >60 mL/min   Anion gap 4 (L) 5 - 15  Brain natriuretic peptide  Result Value Ref Range   B Natriuretic Peptide 84.0 0.0 - 100.0 pg/mL  CK  Result Value Ref Range   Total CK 77 49 - 397 U/L      Assessment & Plan:   Problem List Items Addressed This Visit    Alcoholic cirrhosis of liver with ascites (HCC) - Primary    Bilateral lower extremity edema  Follow-up with [facility] - contact them to discuss Spironolactone add to lasix for cirrhosis  edema    Chronic headaches   Relevant Medications   gabapentin (NEURONTIN) 300 MG capsule   Chronic neuropathic pain   Chronic pain syndrome Clinically with neuropathy bilateral, worsened by edema in setting of cirrhosis Improved on opiates temporarily, however on low dose gabapentin  Plan Treat edema better by f/u with GI will need Cleda Daub in addition to Lasix Titrate up on Gabapentin from 100 TID up to 300mg  TID as instructed FUture may need refer to pain management if uncontrolled pain    Other Visit Diagnoses    Nausea     Refill   Relevant Medications   ondansetron (ZOFRAN-ODT) 4 MG disintegrating tablet      # PTSD / Depression Followed by Behavioral Health, [place] - Elevated PHQ scoring today, related to recent health issues, high score on last question PHQ, but denies active plan - Will follow-up with Psych closely on this per our discussion   Meds ordered this encounter  Medications  . gabapentin (NEURONTIN) 300 MG capsule    Sig: Take 1 capsule (300 mg total) by mouth 3 (three) times daily.    Dispense:  90 capsule    Refill:  3    Dose increase  . ondansetron (ZOFRAN-ODT) 4 MG disintegrating tablet    Sig: Take 1 tablet (4 mg total) by mouth every 8 (eight) hours as needed (for nausea/vomiting.).    Dispense:  90 tablet    Refill:  2    Follow up plan: Return in about 6 weeks (around 12/17/2017) for 4-6 weeks if not improved - LE edema, leg pain.  Saralyn Pilar, DO [facility] 11/05/2017, 2:58 PM

## 2017-11-05 NOTE — Patient Instructions (Addendum)
Thank you for coming to the office today.  Call [facility] - Dr Delane GingerGill to ask them about a medication that they recommended back in June 2019 for swelling in legs.  Medicination - Spironolactone (Aldactone) - this should be taken WITH the Furosemide lasix and they may adjust this dose for you - this is specialized for liver swelling and should help this problem.  [facility] - but they should be able to help with this.  For Gabapentin - I think your pain in legs is due to neuropathy - go ahead and INCREASE Gabapentin to 200mg  up to 3 times a day - (take two of the existing 100mg  capsules first) for about few days to week - once adjusted then go ahead and start the new rx for 300mg  capsules take 1 up to 3 times a day  Please schedule a Follow-up Appointment to: Return in about 6 weeks (around 12/17/2017) for 4-6 weeks if not improved - LE edema, leg pain.  If you have any other questions or concerns, please feel free to call the office or send a message through MyChart. You may also schedule an earlier appointment if necessary.  Additionally, you may be receiving a survey about your experience at our office within a few days to 1 week by e-mail or mail. We value your feedback.  Saralyn PilarAlexander Carlesha Seiple, DO [facility]

## 2017-11-06 ENCOUNTER — Other Ambulatory Visit: Payer: Self-pay

## 2017-11-06 NOTE — Patient Outreach (Signed)
[facility] Care Management  11/06/2017  Lawerance CruelKarl Chmiel 07/01/1960 [id number]    Telephone Screen Referral Date :10/30/2017 Referral Source:[facility] ED Census Referral Reason:ED Utilization Insurance:[employer]    Outreach attempt # 2 To patient. No answer. HIPAA compliant voicemail left with contact information.   Plan: RN Health Coach will make another attempt to the patient within four business days.  Juanell Fairlyraci Avanthika Dehnert RN, BSN, [facility] RN Health Coach Disease Management [facility] Direct Dial: [phone] [phone]

## 2017-11-08 ENCOUNTER — Other Ambulatory Visit: Payer: Self-pay | Admitting: Family Medicine

## 2017-11-08 DIAGNOSIS — I10 Essential (primary) hypertension: Secondary | ICD-10-CM

## 2017-11-09 ENCOUNTER — Telehealth: Payer: Self-pay | Admitting: Gastroenterology

## 2017-11-09 DIAGNOSIS — K7030 Alcoholic cirrhosis of liver without ascites: Secondary | ICD-10-CM

## 2017-11-09 DIAGNOSIS — R600 Localized edema: Secondary | ICD-10-CM

## 2017-11-09 MED ORDER — SPIRONOLACTONE 50 MG PO TABS
50.0000 mg | ORAL_TABLET | Freq: Every day | ORAL | 3 refills | Status: DC
Start: 2017-11-09 — End: 2018-03-03

## 2017-11-09 NOTE — Telephone Encounter (Signed)
He's only on Lasix 20 mg currently. His kidney function and serum potassium are stable/normal. Lets add 50 mg spironolactone daily. Typical standard dosing is Aldactone/Lasix 100mg /40mg ; this will get him up to half dose.  Recheck BMP 2 weeks (orders entered)  Have him call us in a week and let us know how his swelling is doing.

## 2017-11-09 NOTE — Telephone Encounter (Signed)
PT is aware.

## 2017-11-09 NOTE — Telephone Encounter (Signed)
[phone] PLEASE CALL PATIENT, HE HAS A QUESTION ABOUT HIS MEDICATION

## 2017-11-09 NOTE — Addendum Note (Signed)
Addended by: Delane GingerGILL, ERIC A on: 11/09/2017 11:45 AM   Modules accepted: Orders

## 2017-11-09 NOTE — Telephone Encounter (Signed)
PT said his PCP, Dr. Kirtland BouchardK, told him he should call our office to discuss adding Spironolactone to his fluid regiment. He is now on the Lasix 20 mg and is having swelling in his extremities. Forwarding to Wynne DustEric Gill, NP to advise in Dr. Darrick PennaFields absence.

## 2017-11-12 ENCOUNTER — Other Ambulatory Visit: Payer: Self-pay

## 2017-11-12 DIAGNOSIS — K7030 Alcoholic cirrhosis of liver without ascites: Secondary | ICD-10-CM

## 2017-11-12 DIAGNOSIS — R600 Localized edema: Secondary | ICD-10-CM

## 2017-11-12 NOTE — Telephone Encounter (Signed)
Lab orders mailed to do labs in 2 weeks.

## 2017-11-12 NOTE — Patient Outreach (Signed)
[facility]) Care Management  11/12/2017  Lawerance CruelKarl Gravley 05/12/1960 [id number]    Telephone Screen Referral Date :10/30/2017 Referral Source:[facility] ED Census Referral Reason:ED Utilization Insurance:[employer]    Outreach attempt #3To patient. Line busy unable to leave a  Message.  Plan: If no response to calls and letter in ten business days Rn health Coach will proceed with case closure.   Juanell Fairlyraci Jamelah Sitzer RN, BSN, [facility] RN Health Coach Disease Management [facility] Direct Dial:  [phone]  Fax: [phone]

## 2017-11-13 ENCOUNTER — Other Ambulatory Visit: Payer: Self-pay

## 2017-11-13 ENCOUNTER — Ambulatory Visit: Payer: Medicare HMO | Admitting: Orthopaedic Surgery

## 2017-11-13 NOTE — Patient Outreach (Signed)
[facility]) Care Management  11/13/2017  Christian Sanders 07/20/1960 [id number]    Multiple attempts to establish contact with patient without success. No response from calls or letter mailed to patient.    Plan: Case is being closed at this time due to be able to contact.  Christian Fairlyraci Lemario Chaikin RN, BSN, [facility] RN Health Coach Disease Management [facility] Direct Dial:  [phone]  Fax: [phone]

## 2017-11-18 ENCOUNTER — Emergency Department (HOSPITAL_COMMUNITY): Payer: Medicare HMO

## 2017-11-18 ENCOUNTER — Other Ambulatory Visit: Payer: Self-pay

## 2017-11-18 ENCOUNTER — Encounter (HOSPITAL_COMMUNITY): Payer: Self-pay | Admitting: *Deleted

## 2017-11-18 ENCOUNTER — Emergency Department (HOSPITAL_COMMUNITY)
Admission: EM | Admit: 2017-11-18 | Discharge: 2017-11-18 | Disposition: A | Discharge status: Home / Self Care | Payer: Medicare HMO | Attending: Emergency Medicine | Admitting: Emergency Medicine

## 2017-11-18 DIAGNOSIS — F17210 Nicotine dependence, cigarettes, uncomplicated: Secondary | ICD-10-CM | POA: Diagnosis not present

## 2017-11-18 DIAGNOSIS — M5442 Lumbago with sciatica, left side: Secondary | ICD-10-CM | POA: Diagnosis not present

## 2017-11-18 DIAGNOSIS — R69 Illness, unspecified: Secondary | ICD-10-CM | POA: Diagnosis not present

## 2017-11-18 DIAGNOSIS — Z79899 Other long term (current) drug therapy: Secondary | ICD-10-CM | POA: Diagnosis not present

## 2017-11-18 DIAGNOSIS — M5432 Sciatica, left side: Secondary | ICD-10-CM

## 2017-11-18 DIAGNOSIS — I10 Essential (primary) hypertension: Secondary | ICD-10-CM | POA: Diagnosis not present

## 2017-11-18 DIAGNOSIS — M545 Low back pain: Secondary | ICD-10-CM | POA: Diagnosis not present

## 2017-11-18 DIAGNOSIS — M79605 Pain in left leg: Secondary | ICD-10-CM | POA: Diagnosis not present

## 2017-11-18 HISTORY — DX: Unspecified cirrhosis of liver: K74.60

## 2017-11-18 HISTORY — DX: Personal history of other infectious and parasitic diseases: Z86.19

## 2017-11-18 MED ORDER — OXYCODONE HCL 5 MG PO TABS: 5.0000 mg | ORAL_TABLET | ORAL | 0 refills | Status: DC | PRN

## 2017-11-18 MED ORDER — METHOCARBAMOL 500 MG PO TABS
500.0000 mg | ORAL_TABLET | Freq: Once | ORAL | Status: AC
  Administered 2017-11-18: 500 mg via ORAL
  Filled 2017-11-18: qty 1

## 2017-11-18 MED ORDER — OXYCODONE HCL 5 MG PO TABS
5.0000 mg | ORAL_TABLET | Freq: Once | ORAL | Status: AC
  Administered 2017-11-18: 5 mg via ORAL
  Filled 2017-11-18: qty 1

## 2017-11-18 MED ORDER — METHOCARBAMOL 500 MG PO TABS: 500.0000 mg | ORAL_TABLET | Freq: Two times a day (BID) | ORAL | 0 refills | Status: DC

## 2017-11-18 MED ORDER — DEXAMETHASONE SODIUM PHOSPHATE 10 MG/ML IJ SOLN
10.0000 mg | Freq: Once | INTRAMUSCULAR | Status: AC
  Administered 2017-11-18: 10 mg via INTRAMUSCULAR
  Filled 2017-11-18: qty 1

## 2017-11-18 MED ORDER — LIDOCAINE 5 % EX PTCH
1.0000 | MEDICATED_PATCH | CUTANEOUS | Status: DC
  Administered 2017-11-18: 1 via TRANSDERMAL
  Filled 2017-11-18 (×2): qty 1

## 2017-11-18 MED ORDER — METHYLPREDNISOLONE 4 MG PO TBPK: ORAL_TABLET | ORAL | 0 refills | Status: DC

## 2017-11-18 NOTE — Discharge Instructions (Signed)
Your symptoms are most likely due to sciatic nerve inflammation, your x-ray shows some degenerative changes in your back that could lead to this.  Please use steroids as directed, Robaxin and oxycodone as needed to help with pain.  You can also use over-the-counter salon pas lidocaine patches, as well as ice and heat.  Please follow-up with your primary care doctor as well as Dr. Romeo AppleHarrison with orthopedics for further evaluation.

## 2017-11-18 NOTE — ED Provider Notes (Signed)
[facility] Provider Note   CSN: [id number] Arrival date & time: 11/18/17  1739     History   Chief Complaint Chief Complaint  Patient presents with  . Leg Pain    HPI Christian Sanders is a 57 y.o. male.  Christian Sanders is a 57 y.o. Male patient with history of alcoholic liver cirrhosis, esophageal varices, hep C, hypertension, GI bleeding, chronic neuropathic pain and prior compression fractures, presents to the emergency department for evaluation of pain in the left leg which shoots up and down the leg.  Pain has been present for 2 days when pain first came on patient fell to the ground and initially had difficulty getting up, but was able to get up on his own, did not hit his head or neck.  He reports since then the pain seemed to ease off a little bit until this afternoon when it got worse again.  Patient reports it is a constant burning pain up and down the back of the left leg that wraps around the left hip.  He has not noticed any redness, swelling or warmth in the legs.  Patient reports has a history of chronic leg swelling but this is actually been significantly improved since last seen in the emergency department on 7/29.  He denies any fevers or chills.  No loss of bowel or bladder control, no saddle anesthesia, no abdominal pain or urinary symptoms.  Patient has not taken anything for pain outside of his chronic gabapentin.  Has not tried anything else.  Pain worse with movement and forward bending.  Patient does report remote history of vertebral compression fractures.     Past Medical History:  Diagnosis Date  . Abnormal CT of liver 12/20/2016  . Anxiety   . Bipolar disorder (HCC)   . Cirrhosis (HCC)   . Dentures complicating chewing    full upper, partial lower  . Depression   . Esophageal varices determined by endoscopy (HCC)   . Gastritis and duodenitis   . GERD (gastroesophageal reflux disease)   . Headache    chronic, s/p facial injury and  reconstruction  . Hx of hepatitis C   . Hypertension   . Portal hypertensive gastropathy (HCC)   . PTSD (post-traumatic stress disorder)   . Vertebral column disorder    2 crushed thoracic vertebra    Patient Active Problem List   Diagnosis Date Noted  . Chronic pain syndrome 11/05/2017  . Chronic neuropathic pain 11/05/2017  . Bilateral lower extremity edema 11/05/2017  . Alcoholic cirrhosis of liver with ascites (HCC) 10/23/2017  . Coagulopathy (HCC) 10/23/2017  . Hematochezia 10/22/2017  . Acute GI bleeding   . Lower abdominal pain   . Anemia   . Thrombocytopenia (HCC)   . History of alcohol abuse   . Hepatitis C 09/06/2017  . Mild episode of recurrent major depressive disorder (HCC) 06/13/2017  . Panic disorder 06/13/2017  . Alcohol use disorder, severe, in early remission (HCC) 06/13/2017  . Esophageal varices in alcoholic cirrhosis (HCC)   . Depression 05/08/2017  . Varices of esophagus determined by endoscopy (HCC)   . Gastritis and duodenitis   . Splenomegaly 03/08/2017  . Hepatic cirrhosis (HCC) 12/27/2016  . RUQ pain 12/27/2016  . Portal hypertension (HCC) 12/27/2016  . Elevated liver enzymes 03/02/2016  . BPH without urinary obstruction 02/28/2016  . ED (erectile dysfunction) 12/28/2015  . SK (seborrheic keratosis) 12/28/2015  . Chronic headaches 02/09/2015  . Tobacco abuse 02/09/2015  . Hypertension  11/23/2014  . PTSD (post-traumatic stress disorder) 11/23/2014  . Special screening for malignant neoplasms, colon   . Chronic mid back pain 12/16/2013  . Closed compression fracture of thoracic vertebra (HCC) 09/05/2013    Past Surgical History:  Procedure Laterality Date  . BIOPSY  04/17/2017   Procedure: BIOPSY;  Surgeon: West Bali, MD;  [facility];  Service: Endoscopy;;  gastric for h pylori  . COLONOSCOPY WITH PROPOFOL N/A 10/09/2014   Procedure: COLONOSCOPY WITH PROPOFOL;  Surgeon: Midge Minium, MD;  Location: [facility];   Service: Endoscopy;  Laterality: N/A;  . ESOPHAGEAL BANDING N/A 04/17/2017   Procedure: ESOPHAGEAL BANDING;  Surgeon: West Bali, MD;  [facility];  Service: Endoscopy;  Laterality: N/A;  . ESOPHAGEAL BANDING N/A 05/22/2017   Procedure: ESOPHAGEAL BANDING;  Surgeon: West Bali, MD;  [facility];  Service: Endoscopy;  Laterality: N/A;  . ESOPHAGEAL BANDING N/A 06/26/2017   Procedure: ESOPHAGEAL BANDING;  Surgeon: West Bali, MD;  [facility];  Service: Endoscopy;  Laterality: N/A;  . ESOPHAGOGASTRODUODENOSCOPY (EGD) WITH PROPOFOL N/A 04/17/2017   Procedure: ESOPHAGOGASTRODUODENOSCOPY (EGD) WITH PROPOFOL;  Surgeon: West Bali, MD;  [facility];  Service: Endoscopy;  Laterality: N/A;  10:45am  . ESOPHAGOGASTRODUODENOSCOPY (EGD) WITH PROPOFOL N/A 05/22/2017   Procedure: ESOPHAGOGASTRODUODENOSCOPY (EGD) WITH PROPOFOL;  Surgeon: West Bali, MD;  [facility];  Service: Endoscopy;  Laterality: N/A;  . ESOPHAGOGASTRODUODENOSCOPY (EGD) WITH PROPOFOL N/A 06/26/2017   Procedure: ESOPHAGOGASTRODUODENOSCOPY (EGD) WITH PROPOFOL;  Surgeon: West Bali, MD;  [facility];  Service: Endoscopy;  Laterality: N/A;  1:45pm  . ESOPHAGOGASTRODUODENOSCOPY (EGD) WITH PROPOFOL N/A 09/11/2017   Procedure: ESOPHAGOGASTRODUODENOSCOPY (EGD) WITH PROPOFOL;  Surgeon: West Bali, MD;  [facility];  Service: Endoscopy;  Laterality: N/A;  10:30am  . FACIAL RECONSTRUCTION SURGERY     eye socket with 2 metal plates        Home Medications    Prior to Admission medications   Medication Sig Start Date End Date Taking? Authorizing Provider  ALPRAZolam Prudy Feeler) 0.5 MG tablet Take 0.5-1 tablets (0.25-0.5 mg total) by mouth 2 (two) times daily as needed for anxiety. 10/18/17   Neysa Hotter, MD  amLODipine (NORVASC) 5 MG tablet TAKE 1 TABLET BY MOUTH EVERY DAY 11/08/17   Karamalegos, Netta Neat, DO  fluticasone (FLONASE) 50  MCG/ACT nasal spray PLACE 2 SPRAYS INTO BOTH NOSTRILS DAILY AS NEEDED FOR ALLERGIES. 10/25/17   Althea Charon, Netta Neat, DO  furosemide (LASIX) 20 MG tablet Take 1 tablet (20 mg total) by mouth daily. 09/07/17   Anice Paganini, NP  gabapentin (NEURONTIN) 300 MG capsule Take 1 capsule (300 mg total) by mouth 3 (three) times daily. 11/05/17   Karamalegos, Netta Neat, DO  lurasidone (LATUDA) 20 MG TABS tablet Take 1 tablet (20 mg total) by mouth daily. 10/10/17   Neysa Hotter, MD  Melatonin 10 MG TABS Take 30 mg by mouth at bedtime.    [provider]  methocarbamol (ROBAXIN) 500 MG tablet Take 1 tablet (500 mg total) by mouth 2 (two) times daily. 11/18/17   Dartha Lodge, PA-C  methylPREDNISolone (MEDROL DOSEPAK) 4 MG TBPK tablet Take as directed 11/18/17   Dartha Lodge, PA-C  omeprazole (PRILOSEC) 20 MG capsule TAKE 1 CAPSULE BY MOUTH 30 MINUTES PRIOR TO FIRST MEAL 10/30/17   [provider]  ondansetron (ZOFRAN-ODT) 4 MG disintegrating tablet Take 1 tablet (4  mg total) by mouth every 8 (eight) hours as needed (for nausea/vomiting.). 11/05/17   Karamalegos, Netta Neat, DO  oxyCODONE (ROXICODONE) 5 MG immediate release tablet Take 1 tablet (5 mg total) by mouth every 4 (four) hours as needed for severe pain. 11/18/17   Dartha Lodge, PA-C  sertraline (ZOLOFT) 50 MG tablet Take 1 tablet (50 mg total) by mouth daily. 10/10/17   Neysa Hotter, MD  sildenafil (REVATIO) 20 MG tablet TAKE 1 TO 5 TABLETS BY MOUTH ONCE DAILY AS NEEDED Patient taking differently: Take 20-100 mg by mouth as needed (for ED).  05/08/17   Althea Charon, Netta Neat, DO  spironolactone (ALDACTONE) 50 MG tablet Take 1 tablet (50 mg total) by mouth daily. 11/09/17   Anice Paganini, NP    Family History Family History  Problem Relation Age of Onset  . Leukemia Father   . Colon cancer Neg Hx   . Liver disease Neg Hx     Social History Social History   Tobacco Use  . Smoking status: Current Every Day Smoker    Packs/day:  0.25    Years: 30.00    Pack years: 7.50    Types: Cigarettes  . Smokeless tobacco: Never Used  Substance Use Topics  . Alcohol use: No    Alcohol/week: 6.0 standard drinks    Types: 6 Cans of beer per week    Frequency: Never    Comment: None as of 03/08/17; previously 1-1.5 cases of beer a day in his 9s.  . Drug use: No     Allergies   Bee venom; Ibuprofen; Pineapple; Pollen extract; and Tylenol [acetaminophen]   Review of Systems Review of Systems  Constitutional: Negative for chills and fever.  HENT: Negative.   Respiratory: Negative for cough and shortness of breath.   Cardiovascular: Negative for chest pain.  Gastrointestinal: Negative for abdominal pain, constipation, nausea and vomiting.  Genitourinary: Negative for difficulty urinating, dysuria and frequency.  Musculoskeletal: Positive for arthralgias, back pain and myalgias.  Skin: Negative for color change, rash and wound.  Neurological: Negative for weakness and numbness.     Physical Exam Updated Vital Signs BP (!) 113/43   Pulse 87   Temp 97.9 F (36.6 C) (Oral)   Resp 18   Ht 6\' 2"  (1.88 m)   Wt 106.6 kg   SpO2 100%   BMI 30.17 kg/m   Physical Exam  Constitutional: He is oriented to person, place, and time. He appears well-developed and well-nourished. No distress.  HENT:  Head: Normocephalic and atraumatic.  Eyes: Right eye exhibits no discharge. Left eye exhibits no discharge.  Neck: Neck supple.  Cardiovascular: Normal rate, regular rhythm, normal heart sounds and intact distal pulses.  Pulmonary/Chest: Effort normal and breath sounds normal. No respiratory distress.  Respirations equal and unlabored, patient able to speak in full sentences, lungs clear to auscultation bilaterally  Abdominal: Soft. Bowel sounds are normal. He exhibits no distension and no mass. There is no tenderness. There is no guarding.  Abdomen soft, nondistended, nontender to palpation in all quadrants without guarding or  peritoneal signs  Musculoskeletal:  Tenderness to palpation at midline lumbar spine which extends over the left side and into the left buttock.  Pain worse with range of motion of the left lower extremity.  Positive straight leg raise.  Tenderness throughout the left lower extremity, patient seems hypersensitive to touch, no palpable swelling, erythema, warmth or deformity.  Normal range of motion at the hip knee and ankle with  some discomfort.  2+ DP and TP pulses  Neurological: He is alert and oriented to person, place, and time. Coordination normal.  Speech clear, following commands. Moving all extremities without difficulty. 5/5 strength in bilateral lower extremities, sensation intact.  Skin: Skin is warm and dry. Capillary refill takes less than 2 seconds. He is not diaphoretic.  Psychiatric: He has a normal mood and affect. His behavior is normal.  Nursing note and vitals reviewed.    ED Treatments / Results  Labs (all labs ordered are listed, but only abnormal results are displayed) Labs Reviewed - No data to display  EKG None  Radiology Dg Lumbar Spine Complete  Result Date: 11/18/2017 CLINICAL DATA:  Radicular pain EXAM: LUMBAR SPINE - COMPLETE 4+ VIEW COMPARISON:  CT 10/22/2017 FINDINGS: Five non rib-bearing lumbar type vertebra. Vertebral body heights are maintained. Disc spaces are preserved. Minimal anterior osteophyte at L2-L3, L3-L4. IMPRESSION: Minimal degenerative change.  No acute osseous abnormality. Electronically Signed   By: Jasmine PangKim  Fujinaga M.D.   On: 11/18/2017 19:27    Procedures Procedures (including critical care time)  Medications Ordered in ED Medications  lidocaine (LIDODERM) 5 % 1 patch (1 patch Transdermal Patch Applied 11/18/17 1907)  oxyCODONE (Oxy IR/ROXICODONE) immediate release tablet 5 mg (5 mg Oral Given 11/18/17 1907)  methocarbamol (ROBAXIN) tablet 500 mg (500 mg Oral Given 11/18/17 1907)  dexamethasone (DECADRON) injection 10 mg (10 mg  Intramuscular Given 11/18/17 1907)     Initial Impression / Assessment and Plan / ED Course  I have reviewed the triage vital signs and the nursing notes.  Pertinent labs & imaging results that were available during my care of the patient were reviewed by me and considered in my medical decision making (see chart for details).  Normal neurological exam, no evidence of urinary incontinence or retention, pain is consistently reproducible. There is no evidence of AAA or concern for dissection at this time.   Patient can walk but states is painful.  No loss of bowel or bladder control.  No concern for cauda equina.  No fever, night sweats, weight loss, h/o cancer, IVDU.  Pain treated here in the department with adequate improvement. RICE protocol and pain medicine indicated and discussed with patient. I have also discussed reasons to return immediately to the ER.  Patient expresses understanding and agrees with plan.  Pt looked up in [employer] database, no narcotic pain prescriptions since 7/23 when he received #12 10 mg oxycodone, did have prescription for #60 0.5 mg xanax filled on 8/12, which is one of his regular medications.  Final Clinical Impressions(s) / ED Diagnoses   Final diagnoses:  Left leg pain  Sciatica of left side    ED Discharge Orders         Ordered    methocarbamol (ROBAXIN) 500 MG tablet  2 times daily     11/18/17 2031    methylPREDNISolone (MEDROL DOSEPAK) 4 MG TBPK tablet     11/18/17 2031    oxyCODONE (ROXICODONE) 5 MG immediate release tablet  Every 4 hours PRN     11/18/17 2031           Dartha LodgeFord, Kelsey N, PA-C 11/19/17 0124    Benjiman CorePickering, Nathan, MD 11/20/17 [phone]

## 2017-11-18 NOTE — ED Triage Notes (Signed)
Pt c/o burning pain that starts in left lower leg and shoots up into thigh x 2 days. Denies injury.

## 2017-11-22 NOTE — Telephone Encounter (Signed)
[facility], pt has appt 11/30/17 at 1:30 with Dr. Sherryll BurgerShah.

## 2017-11-23 DIAGNOSIS — R600 Localized edema: Secondary | ICD-10-CM | POA: Diagnosis not present

## 2017-11-23 DIAGNOSIS — R69 Illness, unspecified: Secondary | ICD-10-CM | POA: Diagnosis not present

## 2017-11-23 LAB — BASIC METABOLIC PANEL
BUN: 10 mg/dL (ref 7–25)
CALCIUM: 8.8 mg/dL (ref 8.6–10.3)
CO2: 23 mmol/L (ref 20–32)
Chloride: 106 mmol/L (ref 98–110)
Creat: 0.72 mg/dL (ref 0.70–1.33)
Glucose, Bld: 106 mg/dL — ABNORMAL HIGH (ref 65–99)
POTASSIUM: 4.1 mmol/L (ref 3.5–5.3)
SODIUM: 136 mmol/L (ref 135–146)

## 2017-11-26 NOTE — Progress Notes (Signed)
PT is aware and said the swelling has improved.

## 2017-11-27 ENCOUNTER — Other Ambulatory Visit: Payer: Self-pay | Admitting: Family Medicine

## 2017-11-27 DIAGNOSIS — G8929 Other chronic pain: Secondary | ICD-10-CM

## 2017-11-27 DIAGNOSIS — M2578 Osteophyte, vertebrae: Secondary | ICD-10-CM | POA: Diagnosis not present

## 2017-11-27 DIAGNOSIS — I10 Essential (primary) hypertension: Secondary | ICD-10-CM | POA: Diagnosis not present

## 2017-11-27 DIAGNOSIS — K5790 Diverticulosis of intestine, part unspecified, without perforation or abscess without bleeding: Secondary | ICD-10-CM | POA: Diagnosis not present

## 2017-11-27 DIAGNOSIS — F4310 Post-traumatic stress disorder, unspecified: Secondary | ICD-10-CM | POA: Diagnosis not present

## 2017-11-27 DIAGNOSIS — R69 Illness, unspecified: Secondary | ICD-10-CM | POA: Diagnosis not present

## 2017-11-27 DIAGNOSIS — F410 Panic disorder [episodic paroxysmal anxiety] without agoraphobia: Secondary | ICD-10-CM | POA: Diagnosis not present

## 2017-11-27 DIAGNOSIS — F39 Unspecified mood [affective] disorder: Secondary | ICD-10-CM | POA: Diagnosis not present

## 2017-11-27 DIAGNOSIS — K766 Portal hypertension: Secondary | ICD-10-CM | POA: Diagnosis not present

## 2017-11-27 DIAGNOSIS — Z79899 Other long term (current) drug therapy: Secondary | ICD-10-CM | POA: Diagnosis not present

## 2017-11-27 DIAGNOSIS — R51 Headache: Principal | ICD-10-CM

## 2017-11-27 DIAGNOSIS — B1920 Unspecified viral hepatitis C without hepatic coma: Secondary | ICD-10-CM | POA: Diagnosis not present

## 2017-11-27 DIAGNOSIS — R188 Other ascites: Secondary | ICD-10-CM | POA: Diagnosis not present

## 2017-11-27 DIAGNOSIS — F321 Major depressive disorder, single episode, moderate: Secondary | ICD-10-CM | POA: Diagnosis not present

## 2017-11-27 DIAGNOSIS — R519 Headache, unspecified: Secondary | ICD-10-CM

## 2017-11-27 DIAGNOSIS — R202 Paresthesia of skin: Secondary | ICD-10-CM | POA: Diagnosis not present

## 2017-11-30 DIAGNOSIS — K8020 Calculus of gallbladder without cholecystitis without obstruction: Secondary | ICD-10-CM | POA: Diagnosis not present

## 2017-11-30 DIAGNOSIS — R609 Edema, unspecified: Secondary | ICD-10-CM | POA: Diagnosis not present

## 2017-11-30 DIAGNOSIS — K7460 Unspecified cirrhosis of liver: Secondary | ICD-10-CM | POA: Diagnosis not present

## 2017-11-30 DIAGNOSIS — I10 Essential (primary) hypertension: Secondary | ICD-10-CM | POA: Diagnosis not present

## 2017-11-30 DIAGNOSIS — B182 Chronic viral hepatitis C: Secondary | ICD-10-CM | POA: Diagnosis not present

## 2017-11-30 DIAGNOSIS — I8501 Esophageal varices with bleeding: Secondary | ICD-10-CM | POA: Diagnosis not present

## 2017-11-30 DIAGNOSIS — Z7682 Awaiting organ transplant status: Secondary | ICD-10-CM | POA: Diagnosis not present

## 2017-11-30 DIAGNOSIS — Z1159 Encounter for screening for other viral diseases: Secondary | ICD-10-CM | POA: Diagnosis not present

## 2017-11-30 DIAGNOSIS — R69 Illness, unspecified: Secondary | ICD-10-CM | POA: Diagnosis not present

## 2017-12-03 ENCOUNTER — Other Ambulatory Visit: Payer: Self-pay | Admitting: Nurse Practitioner

## 2017-12-03 DIAGNOSIS — K7460 Unspecified cirrhosis of liver: Secondary | ICD-10-CM

## 2017-12-11 ENCOUNTER — Ambulatory Visit (INDEPENDENT_AMBULATORY_CARE_PROVIDER_SITE_OTHER): Payer: Medicare HMO

## 2017-12-11 ENCOUNTER — Ambulatory Visit (INDEPENDENT_AMBULATORY_CARE_PROVIDER_SITE_OTHER): Payer: Medicare HMO | Admitting: Orthopaedic Surgery

## 2017-12-11 ENCOUNTER — Encounter: Payer: Self-pay | Admitting: Orthopaedic Surgery

## 2017-12-11 ENCOUNTER — Other Ambulatory Visit (HOSPITAL_COMMUNITY): Payer: Self-pay | Admitting: Family Medicine

## 2017-12-11 VITALS — BP 129/77 | HR 88 | Ht 74.0 in | Wt 246.0 lb

## 2017-12-11 DIAGNOSIS — R69 Illness, unspecified: Secondary | ICD-10-CM | POA: Diagnosis not present

## 2017-12-11 DIAGNOSIS — F17210 Nicotine dependence, cigarettes, uncomplicated: Secondary | ICD-10-CM

## 2017-12-11 DIAGNOSIS — M25562 Pain in left knee: Secondary | ICD-10-CM | POA: Diagnosis not present

## 2017-12-11 DIAGNOSIS — G8929 Other chronic pain: Secondary | ICD-10-CM

## 2017-12-11 DIAGNOSIS — M25561 Pain in right knee: Secondary | ICD-10-CM

## 2017-12-11 DIAGNOSIS — M25512 Pain in left shoulder: Secondary | ICD-10-CM

## 2017-12-11 DIAGNOSIS — R202 Paresthesia of skin: Secondary | ICD-10-CM

## 2017-12-11 DIAGNOSIS — M545 Low back pain: Secondary | ICD-10-CM

## 2017-12-11 DIAGNOSIS — M2578 Osteophyte, vertebrae: Secondary | ICD-10-CM

## 2017-12-11 MED ORDER — HYDROCODONE-ACETAMINOPHEN 7.5-325 MG PO TABS: 1.0000 | ORAL_TABLET | Freq: Four times a day (QID) | ORAL | 0 refills | Status: AC | PRN

## 2017-12-11 MED ORDER — PREDNISONE 5 MG (21) PO TBPK: ORAL_TABLET | ORAL | 0 refills | Status: DC

## 2017-12-11 NOTE — Patient Instructions (Signed)
Steps to Quit Smoking Smoking tobacco can be bad for your health. It can also affect almost every organ in your body. Smoking puts you and people around you at risk for many serious long-lasting (chronic) diseases. Quitting smoking is hard, but it is one of the best things that you can do for your health. It is never too late to quit. What are the benefits of quitting smoking? When you quit smoking, you lower your risk for getting serious diseases and conditions. They can include:  Lung cancer or lung disease.  Heart disease.  Stroke.  Heart attack.  Not being able to have children (infertility).  Weak bones (osteoporosis) and broken bones (fractures).  If you have coughing, wheezing, and shortness of breath, those symptoms may get better when you quit. You may also get sick less often. If you are pregnant, quitting smoking can help to lower your chances of having a baby of low birth weight. What can I do to help me quit smoking? Talk with your doctor about what can help you quit smoking. Some things you can do (strategies) include:  Quitting smoking totally, instead of slowly cutting back how much you smoke over a period of time.  Going to in-person counseling. You are more likely to quit if you go to many counseling sessions.  Using resources and support systems, such as: ? Online chats with a counselor. ? Phone quitlines. ? Printed self-help materials. ? Support groups or group counseling. ? Text messaging programs. ? Mobile phone apps or applications.  Taking medicines. Some of these medicines may have nicotine in them. If you are pregnant or breastfeeding, do not take any medicines to quit smoking unless your doctor says it is okay. Talk with your doctor about counseling or other things that can help you.  Talk with your doctor about using more than one strategy at the same time, such as taking medicines while you are also going to in-person counseling. This can help make  quitting easier. What things can I do to make it easier to quit? Quitting smoking might feel very hard at first, but there is a lot that you can do to make it easier. Take these steps:  Talk to your family and friends. Ask them to support and encourage you.  Call phone quitlines, reach out to support groups, or work with a counselor.  Ask people who smoke to not smoke around you.  Avoid places that make you want (trigger) to smoke, such as: ? Bars. ? Parties. ? Smoke-break areas at work.  Spend time with people who do not smoke.  Lower the stress in your life. Stress can make you want to smoke. Try these things to help your stress: ? Getting regular exercise. ? Deep-breathing exercises. ? Yoga. ? Meditating. ? Doing a body scan. To do this, close your eyes, focus on one area of your body at a time from head to toe, and notice which parts of your body are tense. Try to relax the muscles in those areas.  Download or buy apps on your mobile phone or tablet that can help you stick to your quit plan. There are many free apps, such as QuitGuide from the CDC (Centers for Disease Control and Prevention). You can find more support from smokefree.gov and other websites.  This information is not intended to replace advice given to you by your health care provider. Make sure you discuss any questions you have with your health care provider. Document Released: 01/14/2009 Document   Revised: 11/16/2015 Document Reviewed: 08/04/2014 Elsevier Interactive Patient Education  2018 Elsevier Inc.  

## 2017-12-11 NOTE — Progress Notes (Signed)
Patient Christian Sanders, male DOB:1960/04/11, 57 y.o. JWJ:[id number]  Chief Complaint  Patient presents with  . Leg Pain    right leg swelling /pain   . Shoulder Pain    left    HPI  Christian Sanders is a 57 y.o. male who has chronic pain of the left shoulder.  He has pain with overhead use.  He has no new trauma, no redness, no numbness.  He is doing his exercises at home.  He has developed pain in both knees recently.  He has no trauma.  He has no redness or giving way.  He has had swelling and popping.  He has tried Advil and ice with some help.  The right knee is more painful than the left.   Body mass index is 31.58 kg/m.  ROS  Review of Systems  Musculoskeletal: Positive for arthralgias.  All other systems reviewed and are negative.   All other systems reviewed and are negative.  The following is a summary of the past history medically, past history surgically, known current medicines, social history and family history.  This information is gathered electronically by the computer from prior information and documentation.  I review this each visit and have found including this information at this point in the chart is beneficial and informative.    Past Medical History:  Diagnosis Date  . Abnormal CT of liver 12/20/2016  . Anxiety   . Bipolar disorder (HCC)   . Cirrhosis (HCC)   . Dentures complicating chewing    full upper, partial lower  . Depression   . Esophageal varices determined by endoscopy (HCC)   . Gastritis and duodenitis   . GERD (gastroesophageal reflux disease)   . Headache    chronic, s/p facial injury and reconstruction  . Hx of hepatitis C   . Hypertension   . Portal hypertensive gastropathy (HCC)   . PTSD (post-traumatic stress disorder)   . Vertebral column disorder    2 crushed thoracic vertebra    Past Surgical History:  Procedure Laterality Date  . BIOPSY  04/17/2017   Procedure: BIOPSY;  Surgeon: West Bali, MD;  [facility];  Service: Endoscopy;;  gastric for h pylori  . COLONOSCOPY WITH PROPOFOL N/A 10/09/2014   Procedure: COLONOSCOPY WITH PROPOFOL;  Surgeon: Midge Minium, MD;  Location: [facility];  Service: Endoscopy;  Laterality: N/A;  . ESOPHAGEAL BANDING N/A 04/17/2017   Procedure: ESOPHAGEAL BANDING;  Surgeon: West Bali, MD;  [facility];  Service: Endoscopy;  Laterality: N/A;  . ESOPHAGEAL BANDING N/A 05/22/2017   Procedure: ESOPHAGEAL BANDING;  Surgeon: West Bali, MD;  [facility];  Service: Endoscopy;  Laterality: N/A;  . ESOPHAGEAL BANDING N/A 06/26/2017   Procedure: ESOPHAGEAL BANDING;  Surgeon: West Bali, MD;  [facility];  Service: Endoscopy;  Laterality: N/A;  . ESOPHAGOGASTRODUODENOSCOPY (EGD) WITH PROPOFOL N/A 04/17/2017   Procedure: ESOPHAGOGASTRODUODENOSCOPY (EGD) WITH PROPOFOL;  Surgeon: West Bali, MD;  [facility];  Service: Endoscopy;  Laterality: N/A;  10:45am  . ESOPHAGOGASTRODUODENOSCOPY (EGD) WITH PROPOFOL N/A 05/22/2017   Procedure: ESOPHAGOGASTRODUODENOSCOPY (EGD) WITH PROPOFOL;  Surgeon: West Bali, MD;  [facility];  Service: Endoscopy;  Laterality: N/A;  . ESOPHAGOGASTRODUODENOSCOPY (EGD) WITH PROPOFOL N/A 06/26/2017   Procedure: ESOPHAGOGASTRODUODENOSCOPY (EGD) WITH PROPOFOL;  Surgeon: West Bali, MD;  [facility];  Service: Endoscopy;  Laterality: N/A;  1:45pm  . ESOPHAGOGASTRODUODENOSCOPY (EGD) WITH PROPOFOL N/A  09/11/2017   Procedure: ESOPHAGOGASTRODUODENOSCOPY (EGD) WITH PROPOFOL;  Surgeon: West Bali, MD;  [facility];  Service: Endoscopy;  Laterality: N/A;  10:30am  . FACIAL RECONSTRUCTION SURGERY     eye socket with 2 metal plates    Family History  Problem Relation Age of Onset  . Leukemia Father   . Colon cancer Neg Hx   . Liver disease Neg Hx     Social History Social History   Tobacco Use  . Smoking status: Current Every Day Smoker     Packs/day: 0.25    Years: 30.00    Pack years: 7.50    Types: Cigarettes  . Smokeless tobacco: Never Used  Substance Use Topics  . Alcohol use: No    Alcohol/week: 6.0 standard drinks    Types: 6 Cans of beer per week    Frequency: Never    Comment: None as of 03/08/17; previously 1-1.5 cases of beer a day in his 54s.  . Drug use: No    Allergies  Allergen Reactions  . Bee Venom Swelling and Other (See Comments)    Throat and Tongue   . Ibuprofen Other (See Comments)    Due to liver  . Pineapple Nausea And Vomiting  . Pollen Extract Other (See Comments)    Runny nose, itchy, watery eyes, congestion  . Tylenol [Acetaminophen] Other (See Comments)    Due to liver    Current Outpatient Medications  Medication Sig Dispense Refill  . ALPRAZolam (XANAX) 0.5 MG tablet Take 0.5-1 tablets (0.25-0.5 mg total) by mouth 2 (two) times daily as needed for anxiety. 60 tablet 2  . amLODipine (NORVASC) 5 MG tablet TAKE 1 TABLET BY MOUTH EVERY DAY 90 tablet 1  . fluticasone (FLONASE) 50 MCG/ACT nasal spray PLACE 2 SPRAYS INTO BOTH NOSTRILS DAILY AS NEEDED FOR ALLERGIES. 48 g 1  . furosemide (LASIX) 20 MG tablet TAKE 1 TABLET BY MOUTH EVERY DAY 90 tablet 3  . gabapentin (NEURONTIN) 300 MG capsule TAKE 1 CAPSULE BY MOUTH THREE TIMES A DAY 270 capsule 2  . HYDROcodone-acetaminophen (NORCO) 7.5-325 MG tablet Take 1 tablet by mouth every 6 (six) hours as needed for up to 7 days. One every six hours as needed for pain.  Seven day limit 28 tablet 0  . lurasidone (LATUDA) 20 MG TABS tablet Take 1 tablet (20 mg total) by mouth daily. 90 tablet 0  . Melatonin 10 MG TABS Take 30 mg by mouth at bedtime.    . methocarbamol (ROBAXIN) 500 MG tablet Take 1 tablet (500 mg total) by mouth 2 (two) times daily. 20 tablet 0  . methylPREDNISolone (MEDROL DOSEPAK) 4 MG TBPK tablet Take as directed 21 tablet 0  . omeprazole (PRILOSEC) 20 MG capsule TAKE 1 CAPSULE BY MOUTH 30 MINUTES PRIOR TO FIRST MEAL  11  .  ondansetron (ZOFRAN-ODT) 4 MG disintegrating tablet Take 1 tablet (4 mg total) by mouth every 8 (eight) hours as needed (for nausea/vomiting.). 90 tablet 2  . oxyCODONE (ROXICODONE) 5 MG immediate release tablet Take 1 tablet (5 mg total) by mouth every 4 (four) hours as needed for severe pain. 12 tablet 0  . sertraline (ZOLOFT) 50 MG tablet Take 1 tablet (50 mg total) by mouth daily. 90 tablet 0  . sildenafil (REVATIO) 20 MG tablet TAKE 1 TO 5 TABLETS BY MOUTH ONCE DAILY AS NEEDED (Patient taking differently: Take 20-100 mg by mouth as needed (for ED). ) 50 tablet 5  . spironolactone (ALDACTONE) 50  MG tablet Take 1 tablet (50 mg total) by mouth daily. 30 tablet 3   No current facility-administered medications for this visit.      Physical Exam  Blood pressure 129/77, pulse 88, height 6\' 2"  (1.88 m), weight 246 lb (111.6 kg).  Constitutional: overall normal hygiene, normal nutrition, well developed, normal grooming, normal body habitus. Assistive device:none  Musculoskeletal: gait and station Limp right, muscle tone and strength are normal, no tremors or atrophy is present.  .  Neurological: coordination overall normal.  Deep tendon reflex/nerve stretch intact.  Sensation normal.  Cranial nerves II-XII intact.   Skin:   Normal overall no scars, lesions, ulcers or rashes. No psoriasis.  Psychiatric: Alert and oriented x 3.  Recent memory intact, remote memory unclear.  Normal mood and affect. Well groomed.  Good eye contact.  Cardiovascular: overall no swelling, no varicosities, no edema bilaterally, normal temperatures of the legs and arms, no clubbing, cyanosis and good capillary refill.  Lymphatic: palpation is normal.  Examination of left Upper Extremity is done.  Inspection:   Overall:  Elbow non-tender without crepitus or defects, forearm non-tender without crepitus or defects, wrist non-tender without crepitus or defects, hand non-tender.    Shoulder: with glenohumeral joint  tenderness, without effusion.   Upper arm: without swelling and tenderness   Range of motion:   Overall:  Full range of motion of the elbow, full range of motion of wrist and full range of motion in fingers.   Shoulder:  left  140 degrees forward flexion; 110 degrees abduction; 30 degrees internal rotation, 30 degrees external rotation, 10 degrees extension, 40 degrees adduction.   Stability:   Overall:  Shoulder, elbow and wrist stable   Strength and Tone:   Overall full shoulder muscles strength, full upper arm strength and normal upper arm bulk and tone.  The bilateral lower extremity is examined:  Inspection:  Thigh:  Non-tender and no defects  Knee has swelling 1/2+ effusion.                        Joint tenderness is present                        Patient is tender over the medial joint line  Lower Leg:  Has normal appearance and no tenderness or defects  Ankle:  Non-tender and no defects  Foot:  Non-tender and no defects Range of Motion:  Knee:  Range of motion is: 0 to 110 right, 0 to 115 left                        Crepitus is  present  Ankle:  Range of motion is normal. Strength and Tone:  The bilateral lower extremity has normal strength and tone. Stability:  Knee:  The knee is stable.  Ankle:  The ankle is stable.  X-rays were done of both knees, reported separately.   All other systems reviewed and are negative   The patient has been educated about the nature of the problem(s) and counseled on treatment options.  The patient appeared to understand what I have discussed and is in agreement with it.  Encounter Diagnoses  Name Primary?  . Bilateral chronic knee pain Yes  . Chronic left shoulder pain   . Cigarette nicotine dependence without complication     PLAN Call if any problems.  Precautions discussed.  Continue current medications.   Return  to clinic 2 weeks   I will begin Prednisone dose pack.  I have refilled his pain medicine. I have reviewed  the [place] Controlled Substance Reporting System web site prior to prescribing narcotic medicine for this patient.    Electronically Signed Darreld Mclean, MD 9/10/201910:32 AM

## 2017-12-12 DIAGNOSIS — R69 Illness, unspecified: Secondary | ICD-10-CM | POA: Diagnosis not present

## 2017-12-17 ENCOUNTER — Ambulatory Visit (HOSPITAL_COMMUNITY): Admission: RE | Admit: 2017-12-17 | Payer: Medicare HMO | Source: Ambulatory Visit

## 2017-12-23 ENCOUNTER — Emergency Department (HOSPITAL_COMMUNITY)
Admission: EM | Admit: 2017-12-23 | Discharge: 2017-12-23 | Disposition: A | Discharge status: Home / Self Care | Payer: Medicare HMO | Attending: Emergency Medicine | Admitting: Emergency Medicine

## 2017-12-23 ENCOUNTER — Encounter (HOSPITAL_COMMUNITY): Payer: Self-pay | Admitting: Emergency Medicine

## 2017-12-23 ENCOUNTER — Other Ambulatory Visit: Payer: Self-pay

## 2017-12-23 DIAGNOSIS — G8929 Other chronic pain: Secondary | ICD-10-CM | POA: Insufficient documentation

## 2017-12-23 DIAGNOSIS — M79605 Pain in left leg: Secondary | ICD-10-CM | POA: Insufficient documentation

## 2017-12-23 DIAGNOSIS — F17210 Nicotine dependence, cigarettes, uncomplicated: Secondary | ICD-10-CM | POA: Insufficient documentation

## 2017-12-23 DIAGNOSIS — M79604 Pain in right leg: Secondary | ICD-10-CM | POA: Diagnosis not present

## 2017-12-23 DIAGNOSIS — R52 Pain, unspecified: Secondary | ICD-10-CM | POA: Diagnosis not present

## 2017-12-23 DIAGNOSIS — R69 Illness, unspecified: Secondary | ICD-10-CM | POA: Diagnosis not present

## 2017-12-23 DIAGNOSIS — N499 Inflammatory disorder of unspecified male genital organ: Secondary | ICD-10-CM | POA: Diagnosis not present

## 2017-12-23 DIAGNOSIS — I10 Essential (primary) hypertension: Secondary | ICD-10-CM | POA: Insufficient documentation

## 2017-12-23 DIAGNOSIS — Z79899 Other long term (current) drug therapy: Secondary | ICD-10-CM | POA: Insufficient documentation

## 2017-12-23 MED ORDER — LORAZEPAM 2 MG/ML IJ SOLN
0.5000 mg | Freq: Once | INTRAMUSCULAR | Status: AC
  Administered 2017-12-23: 0.5 mg via INTRAMUSCULAR
  Filled 2017-12-23: qty 1

## 2017-12-23 MED ORDER — HYDROMORPHONE HCL 1 MG/ML IJ SOLN
1.0000 mg | Freq: Once | INTRAMUSCULAR | Status: AC
  Administered 2017-12-23: 1 mg via INTRAMUSCULAR
  Filled 2017-12-23: qty 1

## 2017-12-23 NOTE — Discharge Instructions (Addendum)
Call your Physician tomorrow am to discuss pain management for chronic pain

## 2017-12-23 NOTE — ED Provider Notes (Signed)
[facility] EMERGENCY DEPARTMENT Provider Note   CSN: [id number] Arrival date & time: 12/23/17  1335     History   Chief Complaint Chief Complaint  Patient presents with  . Leg Pain    HPI Christian Sanders is a 57 y.o. male.  The history is provided by the patient. No language interpreter was used.  Leg Pain   This is a recurrent problem. Episode onset: years. The problem occurs constantly. The pain is present in the back, right upper leg and left upper leg. The quality of the pain is described as aching. The pain is moderate. The symptoms are aggravated by standing. He has tried nothing for the symptoms. The treatment provided no relief. There has been no history of extremity trauma.  Pt reports he has a history of back problems.  Pt reports he had trouble getting out of bed today.  Pt reports trouble walking.  Pt reports falling at church.  Pt reports no impact. He does not think anything is broken.  Pt has a history of liver disease.   Past Medical History:  Diagnosis Date  . Abnormal CT of liver 12/20/2016  . Anxiety   . Bipolar disorder (HCC)   . Cirrhosis (HCC)   . Dentures complicating chewing    full upper, partial lower  . Depression   . Esophageal varices determined by endoscopy (HCC)   . Gastritis and duodenitis   . GERD (gastroesophageal reflux disease)   . Headache    chronic, s/p facial injury and reconstruction  . Hx of hepatitis C   . Hypertension   . Portal hypertensive gastropathy (HCC)   . PTSD (post-traumatic stress disorder)   . Vertebral column disorder    2 crushed thoracic vertebra    Patient Active Problem List   Diagnosis Date Noted  . Chronic pain syndrome 11/05/2017  . Chronic neuropathic pain 11/05/2017  . Bilateral lower extremity edema 11/05/2017  . Alcoholic cirrhosis of liver with ascites (HCC) 10/23/2017  . Coagulopathy (HCC) 10/23/2017  . Hematochezia 10/22/2017  . Acute GI bleeding   . Lower abdominal pain   . Anemia   .  Thrombocytopenia (HCC)   . History of alcohol abuse   . Hepatitis C 09/06/2017  . Mild episode of recurrent major depressive disorder (HCC) 06/13/2017  . Panic disorder 06/13/2017  . Alcohol use disorder, severe, in early remission (HCC) 06/13/2017  . Esophageal varices in alcoholic cirrhosis (HCC)   . Depression 05/08/2017  . Varices of esophagus determined by endoscopy (HCC)   . Gastritis and duodenitis   . Splenomegaly 03/08/2017  . Hepatic cirrhosis (HCC) 12/27/2016  . RUQ pain 12/27/2016  . Portal hypertension (HCC) 12/27/2016  . Elevated liver enzymes 03/02/2016  . BPH without urinary obstruction 02/28/2016  . ED (erectile dysfunction) 12/28/2015  . SK (seborrheic keratosis) 12/28/2015  . Chronic headaches 02/09/2015  . Tobacco abuse 02/09/2015  . Hypertension 11/23/2014  . PTSD (post-traumatic stress disorder) 11/23/2014  . Special screening for malignant neoplasms, colon   . Chronic mid back pain 12/16/2013  . Closed compression fracture of thoracic vertebra (HCC) 09/05/2013    Past Surgical History:  Procedure Laterality Date  . BIOPSY  04/17/2017   Procedure: BIOPSY;  Surgeon: West Bali, MD;  [facility];  Service: Endoscopy;;  gastric for h pylori  . COLONOSCOPY WITH PROPOFOL N/A 10/09/2014   Procedure: COLONOSCOPY WITH PROPOFOL;  Surgeon: Midge Minium, MD;  Location: [facility];  Service: Endoscopy;  Laterality: N/A;  .  ESOPHAGEAL BANDING N/A 04/17/2017   Procedure: ESOPHAGEAL BANDING;  Surgeon: West Bali, MD;  [facility];  Service: Endoscopy;  Laterality: N/A;  . ESOPHAGEAL BANDING N/A 05/22/2017   Procedure: ESOPHAGEAL BANDING;  Surgeon: West Bali, MD;  [facility];  Service: Endoscopy;  Laterality: N/A;  . ESOPHAGEAL BANDING N/A 06/26/2017   Procedure: ESOPHAGEAL BANDING;  Surgeon: West Bali, MD;  [facility];  Service: Endoscopy;  Laterality: N/A;  . ESOPHAGOGASTRODUODENOSCOPY (EGD) WITH  PROPOFOL N/A 04/17/2017   Procedure: ESOPHAGOGASTRODUODENOSCOPY (EGD) WITH PROPOFOL;  Surgeon: West Bali, MD;  [facility];  Service: Endoscopy;  Laterality: N/A;  10:45am  . ESOPHAGOGASTRODUODENOSCOPY (EGD) WITH PROPOFOL N/A 05/22/2017   Procedure: ESOPHAGOGASTRODUODENOSCOPY (EGD) WITH PROPOFOL;  Surgeon: West Bali, MD;  [facility];  Service: Endoscopy;  Laterality: N/A;  . ESOPHAGOGASTRODUODENOSCOPY (EGD) WITH PROPOFOL N/A 06/26/2017   Procedure: ESOPHAGOGASTRODUODENOSCOPY (EGD) WITH PROPOFOL;  Surgeon: West Bali, MD;  [facility];  Service: Endoscopy;  Laterality: N/A;  1:45pm  . ESOPHAGOGASTRODUODENOSCOPY (EGD) WITH PROPOFOL N/A 09/11/2017   Procedure: ESOPHAGOGASTRODUODENOSCOPY (EGD) WITH PROPOFOL;  Surgeon: West Bali, MD;  [facility];  Service: Endoscopy;  Laterality: N/A;  10:30am  . FACIAL RECONSTRUCTION SURGERY     eye socket with 2 metal plates        Home Medications    Prior to Admission medications   Medication Sig Start Date End Date Taking? Authorizing Provider  ALPRAZolam Prudy Feeler) 0.5 MG tablet Take 0.5-1 tablets (0.25-0.5 mg total) by mouth 2 (two) times daily as needed for anxiety. 10/18/17   Neysa Hotter, MD  amLODipine (NORVASC) 5 MG tablet TAKE 1 TABLET BY MOUTH EVERY DAY 11/08/17   Althea Charon, Netta Neat, DO  fluticasone (FLONASE) 50 MCG/ACT nasal spray PLACE 2 SPRAYS INTO BOTH NOSTRILS DAILY AS NEEDED FOR ALLERGIES. 10/25/17   Althea Charon, Netta Neat, DO  furosemide (LASIX) 20 MG tablet TAKE 1 TABLET BY MOUTH EVERY DAY 12/04/17   Anice Paganini, NP  gabapentin (NEURONTIN) 300 MG capsule TAKE 1 CAPSULE BY MOUTH THREE TIMES A DAY 11/27/17   Karamalegos, Alexander J, DO  lurasidone (LATUDA) 20 MG TABS tablet Take 1 tablet (20 mg total) by mouth daily. 10/10/17   Neysa Hotter, MD  Melatonin 10 MG TABS Take 30 mg by mouth at bedtime.    [provider]  methocarbamol (ROBAXIN) 500 MG tablet Take 1  tablet (500 mg total) by mouth 2 (two) times daily. 11/18/17   Dartha Lodge, PA-C  omeprazole (PRILOSEC) 20 MG capsule TAKE 1 CAPSULE BY MOUTH 30 MINUTES PRIOR TO FIRST MEAL 10/30/17   [provider]  ondansetron (ZOFRAN-ODT) 4 MG disintegrating tablet Take 1 tablet (4 mg total) by mouth every 8 (eight) hours as needed (for nausea/vomiting.). 11/05/17   Karamalegos, Netta Neat, DO  oxyCODONE (ROXICODONE) 5 MG immediate release tablet Take 1 tablet (5 mg total) by mouth every 4 (four) hours as needed for severe pain. 11/18/17   Dartha Lodge, PA-C  pregabalin (LYRICA) 50 MG capsule Take 50 mg by mouth 2 (two) times daily. 11/27/17   [provider]  sertraline (ZOLOFT) 50 MG tablet Take 1 tablet (50 mg total) by mouth daily. 10/10/17   Neysa Hotter, MD  sildenafil (REVATIO) 20 MG tablet TAKE 1 TO 5 TABLETS BY MOUTH ONCE DAILY AS NEEDED Patient taking differently: Take 20-100 mg by mouth as needed (for ED).  05/08/17  Karamalegos, Netta NeatAlexander J, DO  spironolactone (ALDACTONE) 50 MG tablet Take 1 tablet (50 mg total) by mouth daily. 11/09/17   Anice PaganiniGill, Eric A, NP    Family History Family History  Problem Relation Age of Onset  . Leukemia Father   . Colon cancer Neg Hx   . Liver disease Neg Hx     Social History Social History   Tobacco Use  . Smoking status: Current Every Day Smoker    Packs/day: 0.25    Years: 30.00    Pack years: 7.50    Types: Cigarettes  . Smokeless tobacco: Never Used  Substance Use Topics  . Alcohol use: No    Alcohol/week: 6.0 standard drinks    Types: 6 Cans of beer per week    Frequency: Never    Comment: None as of 03/08/17; previously 1-1.5 cases of beer a day in his 2420s.  . Drug use: No     Allergies   Bee venom; Ibuprofen; Pineapple; Pollen extract; and Tylenol [acetaminophen]   Review of Systems Review of Systems  All other systems reviewed and are negative.    Physical Exam Updated Vital Signs BP 128/83   Pulse 99   Temp 98.3  F (36.8 C) (Oral)   Resp 18   Ht 6\' 2"  (1.88 m)   Wt 110.7 kg   SpO2 100%   BMI 31.33 kg/m   Physical Exam  Constitutional: He appears well-developed and well-nourished.  Cardiovascular: Normal rate.  Pulmonary/Chest: Effort normal.  Musculoskeletal: Normal range of motion.  Diffusely tender lumbar spine, nv and ns intact   Neurological: He is alert.  Skin: Skin is warm.  Psychiatric: He has a normal mood and affect.  Nursing note and vitals reviewed.    ED Treatments / Results  Labs (all labs ordered are listed, but only abnormal results are displayed) Labs Reviewed - No data to display  EKG None  Radiology No results found.  Procedures Procedures (including critical care time)  Medications Ordered in ED Medications  HYDROmorphone (DILAUDID) injection 1 mg (has no administration in time range)  LORazepam (ATIVAN) injection 0.5 mg (has no administration in time range)     Initial Impression / Assessment and Plan / ED Course  I have reviewed the triage vital signs and the nursing notes.  Pertinent labs & imaging results that were available during my care of the patient were reviewed by me and considered in my medical decision making (see chart for details).     MDM  Pt declined xrays.  He does not think he has anything broken.  Pt given ativan and dilaudid for pain and spasm.   Pt advised he needs to call his MD in the am for pain management.   Final Clinical Impressions(s) / ED Diagnoses   Final diagnoses:  Other chronic pain    ED Discharge Orders    None    An After Visit Summary was printed and given to the patient.    Osie CheeksSofia, Zarin Knupp K, PA-C 12/23/17 1509    Eber HongMiller, Brian, MD 12/23/17 [phone]

## 2017-12-23 NOTE — ED Triage Notes (Signed)
Pt comes in by [facility] for bilateral leg pain, has hx of chronic back pain. Has not taken OTC medications. Denies bowel/bladder incontinence

## 2017-12-23 NOTE — ED Notes (Signed)
Pt reports that he fell back at church and now has pain in back and right leg pain. Pt reports he is out of pain medication (percocet)

## 2017-12-25 ENCOUNTER — Encounter: Payer: Self-pay | Admitting: Orthopaedic Surgery

## 2017-12-25 ENCOUNTER — Ambulatory Visit (INDEPENDENT_AMBULATORY_CARE_PROVIDER_SITE_OTHER): Payer: Medicare HMO | Admitting: Orthopaedic Surgery

## 2017-12-25 VITALS — BP 115/71 | HR 83 | Ht 74.0 in | Wt 246.0 lb

## 2017-12-25 DIAGNOSIS — G8929 Other chronic pain: Secondary | ICD-10-CM

## 2017-12-25 DIAGNOSIS — M25562 Pain in left knee: Secondary | ICD-10-CM

## 2017-12-25 DIAGNOSIS — M25561 Pain in right knee: Secondary | ICD-10-CM | POA: Diagnosis not present

## 2017-12-25 DIAGNOSIS — M25512 Pain in left shoulder: Secondary | ICD-10-CM | POA: Diagnosis not present

## 2017-12-25 MED ORDER — HYDROCODONE-ACETAMINOPHEN 7.5-325 MG PO TABS: 1.0000 | ORAL_TABLET | Freq: Four times a day (QID) | ORAL | 0 refills | Status: AC | PRN

## 2017-12-25 NOTE — Progress Notes (Signed)
Patient Christian Sanders, male DOB:19-Jun-1960, 57 y.o. BJY:[id number]  Chief Complaint  Patient presents with  . Knee Pain    Bilateral knee pain  . Shoulder Pain    Left shoulder pain.    HPI  Christian Sanders is a 57 y.o. male who has knee pain bilaterally and shoulder pain left.  His right knee gave way Sunday and he fell and hurt himself.  He is better but the right knee is tender.  He has no other trauma.  The left shoulder is stable today.  He has pain more with overhead use.  NV intact.   Body mass index is 31.58 kg/m.  ROS  Review of Systems  Musculoskeletal: Positive for arthralgias.  All other systems reviewed and are negative.   All other systems reviewed and are negative.  The following is a summary of the past history medically, past history surgically, known current medicines, social history and family history.  This information is gathered electronically by the computer from prior information and documentation.  I review this each visit and have found including this information at this point in the chart is beneficial and informative.    Past Medical History:  Diagnosis Date  . Abnormal CT of liver 12/20/2016  . Anxiety   . Bipolar disorder (HCC)   . Cirrhosis (HCC)   . Dentures complicating chewing    full upper, partial lower  . Depression   . Esophageal varices determined by endoscopy (HCC)   . Gastritis and duodenitis   . GERD (gastroesophageal reflux disease)   . Headache    chronic, s/p facial injury and reconstruction  . Hx of hepatitis C   . Hypertension   . Portal hypertensive gastropathy (HCC)   . PTSD (post-traumatic stress disorder)   . Vertebral column disorder    2 crushed thoracic vertebra    Past Surgical History:  Procedure Laterality Date  . BIOPSY  04/17/2017   Procedure: BIOPSY;  Surgeon: West Bali, MD;  [facility];  Service: Endoscopy;;  gastric for h pylori  . COLONOSCOPY WITH PROPOFOL N/A 10/09/2014   Procedure:  COLONOSCOPY WITH PROPOFOL;  Surgeon: Midge Minium, MD;  Location: [facility];  Service: Endoscopy;  Laterality: N/A;  . ESOPHAGEAL BANDING N/A 04/17/2017   Procedure: ESOPHAGEAL BANDING;  Surgeon: West Bali, MD;  [facility];  Service: Endoscopy;  Laterality: N/A;  . ESOPHAGEAL BANDING N/A 05/22/2017   Procedure: ESOPHAGEAL BANDING;  Surgeon: West Bali, MD;  [facility];  Service: Endoscopy;  Laterality: N/A;  . ESOPHAGEAL BANDING N/A 06/26/2017   Procedure: ESOPHAGEAL BANDING;  Surgeon: West Bali, MD;  [facility];  Service: Endoscopy;  Laterality: N/A;  . ESOPHAGOGASTRODUODENOSCOPY (EGD) WITH PROPOFOL N/A 04/17/2017   Procedure: ESOPHAGOGASTRODUODENOSCOPY (EGD) WITH PROPOFOL;  Surgeon: West Bali, MD;  [facility];  Service: Endoscopy;  Laterality: N/A;  10:45am  . ESOPHAGOGASTRODUODENOSCOPY (EGD) WITH PROPOFOL N/A 05/22/2017   Procedure: ESOPHAGOGASTRODUODENOSCOPY (EGD) WITH PROPOFOL;  Surgeon: West Bali, MD;  [facility];  Service: Endoscopy;  Laterality: N/A;  . ESOPHAGOGASTRODUODENOSCOPY (EGD) WITH PROPOFOL N/A 06/26/2017   Procedure: ESOPHAGOGASTRODUODENOSCOPY (EGD) WITH PROPOFOL;  Surgeon: West Bali, MD;  [facility];  Service: Endoscopy;  Laterality: N/A;  1:45pm  . ESOPHAGOGASTRODUODENOSCOPY (EGD) WITH PROPOFOL N/A 09/11/2017   Procedure: ESOPHAGOGASTRODUODENOSCOPY (EGD) WITH PROPOFOL;  Surgeon: West Bali, MD;  [facility];  Service: Endoscopy;  Laterality: N/A;  10:30am  . FACIAL RECONSTRUCTION SURGERY     eye socket with 2 metal plates    Family History  Problem Relation Age of Onset  . Leukemia Father   . Colon cancer Neg Hx   . Liver disease Neg Hx     Social History Social History   Tobacco Use  . Smoking status: Current Every Day Smoker    Packs/day: 0.25    Years: 30.00    Pack years: 7.50    Types: Cigarettes  . Smokeless tobacco: Never  Used  Substance Use Topics  . Alcohol use: No    Alcohol/week: 6.0 standard drinks    Types: 6 Cans of beer per week    Frequency: Never    Comment: None as of 03/08/17; previously 1-1.5 cases of beer a day in his 81s.  . Drug use: No    Allergies  Allergen Reactions  . Bee Venom Swelling and Other (See Comments)    Throat and Tongue   . Ibuprofen Other (See Comments)    Due to liver  . Pineapple Nausea And Vomiting  . Pollen Extract Other (See Comments)    Runny nose, itchy, watery eyes, congestion  . Tylenol [Acetaminophen] Other (See Comments)    Due to liver    Current Outpatient Medications  Medication Sig Dispense Refill  . ALPRAZolam (XANAX) 0.5 MG tablet Take 0.5-1 tablets (0.25-0.5 mg total) by mouth 2 (two) times daily as needed for anxiety. 60 tablet 2  . amLODipine (NORVASC) 5 MG tablet TAKE 1 TABLET BY MOUTH EVERY DAY 90 tablet 1  . fluticasone (FLONASE) 50 MCG/ACT nasal spray PLACE 2 SPRAYS INTO BOTH NOSTRILS DAILY AS NEEDED FOR ALLERGIES. 48 g 1  . furosemide (LASIX) 20 MG tablet TAKE 1 TABLET BY MOUTH EVERY DAY 90 tablet 3  . gabapentin (NEURONTIN) 300 MG capsule TAKE 1 CAPSULE BY MOUTH THREE TIMES A DAY 270 capsule 2  . HYDROcodone-acetaminophen (NORCO) 7.5-325 MG tablet Take 1 tablet by mouth every 6 (six) hours as needed for up to 7 days. One every six hours as needed for pain.  Seven day limit 28 tablet 0  . lurasidone (LATUDA) 20 MG TABS tablet Take 1 tablet (20 mg total) by mouth daily. 90 tablet 0  . Melatonin 10 MG TABS Take 30 mg by mouth at bedtime.    . methocarbamol (ROBAXIN) 500 MG tablet Take 1 tablet (500 mg total) by mouth 2 (two) times daily. 20 tablet 0  . omeprazole (PRILOSEC) 20 MG capsule TAKE 1 CAPSULE BY MOUTH 30 MINUTES PRIOR TO FIRST MEAL  11  . ondansetron (ZOFRAN-ODT) 4 MG disintegrating tablet Take 1 tablet (4 mg total) by mouth every 8 (eight) hours as needed (for nausea/vomiting.). 90 tablet 2  . pregabalin (LYRICA) 50 MG capsule Take  50 mg by mouth 2 (two) times daily.  0  . sertraline (ZOLOFT) 50 MG tablet Take 1 tablet (50 mg total) by mouth daily. 90 tablet 0  . sildenafil (REVATIO) 20 MG tablet TAKE 1 TO 5 TABLETS BY MOUTH ONCE DAILY AS NEEDED (Patient taking differently: Take 20-100 mg by mouth as needed (for ED). ) 50 tablet 5  . spironolactone (ALDACTONE) 50 MG tablet Take 1 tablet (50 mg total) by mouth daily. 30 tablet 3   No current facility-administered medications for this visit.      Physical Exam  Blood pressure 115/71, pulse 83, height 6\' 2"  (1.88 m), weight 246 lb (111.6 kg).  Constitutional: overall normal hygiene, normal  nutrition, well developed, normal grooming, normal body habitus. Assistive device:none  Musculoskeletal: gait and station Limp right, muscle tone and strength are normal, no tremors or atrophy is present.  .  Neurological: coordination overall normal.  Deep tendon reflex/nerve stretch intact.  Sensation normal.  Cranial nerves II-XII intact.   Skin:   Normal overall no scars, lesions, ulcers or rashes. No psoriasis.  Psychiatric: Alert and oriented x 3.  Recent memory intact, remote memory unclear.  Normal mood and affect. Well groomed.  Good eye contact.  Cardiovascular: overall no swelling, no varicosities, no edema bilaterally, normal temperatures of the legs and arms, no clubbing, cyanosis and good capillary refill.  Lymphatic: palpation is normal.  Right knee is tender with effusion.  ROM is 0 to 105 with medial joint pain.  He has positive medial McMurray.  He has limp to the right.  NV intact.  Left shoulder has full motion but pain in the extremes.  NV intact.  All other systems reviewed and are negative   The patient has been educated about the nature of the problem(s) and counseled on treatment options.  The patient appeared to understand what I have discussed and is in agreement with it.  Encounter Diagnoses  Name Primary?  . Acute pain of right knee   .  Bilateral chronic knee pain Yes  . Chronic left shoulder pain     PLAN Call if any problems.  Precautions discussed.  Continue current medications.   Return to clinic Get MRI of the right knee.  I am concerned about a possible medial meniscus tear.   I have reviewed the [place]  Controlled Substance Reporting System web site prior to prescribing narcotic medicine for this patient.   The patient has read and signed an Opioid Treatment Agreement which has been scanned and added to the medical record.  The patient understands the agreement and agrees to abide with it.  The patient has chronic pain that is being treated with an opioid which relieves the pain.  The patient understands potential complications with chronic opioid treatment.   Electronically Signed Darreld McleanWayne Horris Speros, MD 9/24/20199:16 AM

## 2017-12-26 ENCOUNTER — Other Ambulatory Visit: Payer: Self-pay | Admitting: Family Medicine

## 2017-12-26 DIAGNOSIS — R519 Headache, unspecified: Secondary | ICD-10-CM

## 2017-12-26 DIAGNOSIS — R51 Headache: Principal | ICD-10-CM

## 2017-12-26 MED ORDER — GABAPENTIN 300 MG PO CAPS: ORAL_CAPSULE | ORAL | 1 refills | Status: DC

## 2017-12-28 DIAGNOSIS — M25561 Pain in right knee: Secondary | ICD-10-CM | POA: Diagnosis not present

## 2017-12-28 DIAGNOSIS — M129 Arthropathy, unspecified: Secondary | ICD-10-CM | POA: Diagnosis not present

## 2017-12-28 DIAGNOSIS — Z79899 Other long term (current) drug therapy: Secondary | ICD-10-CM | POA: Diagnosis not present

## 2017-12-28 DIAGNOSIS — M25562 Pain in left knee: Secondary | ICD-10-CM | POA: Diagnosis not present

## 2018-01-04 ENCOUNTER — Ambulatory Visit
Admission: RE | Admit: 2018-01-04 | Discharge: 2018-01-04 | Disposition: A | Discharge status: Home / Self Care | Payer: Medicare HMO | Source: Ambulatory Visit | Attending: Orthopaedic Surgery | Admitting: Orthopaedic Surgery

## 2018-01-04 DIAGNOSIS — M25561 Pain in right knee: Secondary | ICD-10-CM

## 2018-01-07 ENCOUNTER — Telehealth (HOSPITAL_COMMUNITY): Payer: Self-pay | Admitting: *Deleted

## 2018-01-07 ENCOUNTER — Other Ambulatory Visit (HOSPITAL_COMMUNITY): Payer: Self-pay | Admitting: Psychiatry

## 2018-01-07 MED ORDER — LURASIDONE HCL 20 MG PO TABS
20.0000 mg | ORAL_TABLET | Freq: Every day | ORAL | 0 refills | Status: DC
Start: 2018-01-07 — End: 2018-02-07

## 2018-01-07 MED ORDER — ALPRAZOLAM 0.5 MG PO TABS: 0.2500 mg | ORAL_TABLET | Freq: Two times a day (BID) | ORAL | 0 refills | Status: DC | PRN

## 2018-01-07 NOTE — Telephone Encounter (Signed)
Dr Vanetta Shawl Patient missed appointment due to @ another appointment took longer than expected. Requesting refill on Xanax. Will be calling to make another appointment.

## 2018-01-07 NOTE — Telephone Encounter (Signed)
It was ordered this morning to start on 10/9.

## 2018-01-10 ENCOUNTER — Ambulatory Visit (HOSPITAL_COMMUNITY): Payer: Self-pay | Admitting: Psychiatry

## 2018-01-10 DIAGNOSIS — G894 Chronic pain syndrome: Secondary | ICD-10-CM | POA: Diagnosis not present

## 2018-01-10 DIAGNOSIS — M25561 Pain in right knee: Secondary | ICD-10-CM | POA: Diagnosis not present

## 2018-01-10 DIAGNOSIS — M25562 Pain in left knee: Secondary | ICD-10-CM | POA: Diagnosis not present

## 2018-01-10 DIAGNOSIS — Z79899 Other long term (current) drug therapy: Secondary | ICD-10-CM | POA: Diagnosis not present

## 2018-01-16 DIAGNOSIS — K7469 Other cirrhosis of liver: Secondary | ICD-10-CM | POA: Diagnosis not present

## 2018-01-16 DIAGNOSIS — B182 Chronic viral hepatitis C: Secondary | ICD-10-CM | POA: Diagnosis not present

## 2018-01-17 DIAGNOSIS — M545 Low back pain: Secondary | ICD-10-CM | POA: Diagnosis not present

## 2018-01-17 DIAGNOSIS — R202 Paresthesia of skin: Secondary | ICD-10-CM | POA: Diagnosis not present

## 2018-01-17 DIAGNOSIS — I10 Essential (primary) hypertension: Secondary | ICD-10-CM | POA: Diagnosis not present

## 2018-01-28 ENCOUNTER — Ambulatory Visit (HOSPITAL_COMMUNITY): Payer: Medicare HMO | Admitting: Psychiatry
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
## 2018-02-07 ENCOUNTER — Ambulatory Visit (INDEPENDENT_AMBULATORY_CARE_PROVIDER_SITE_OTHER): Payer: Medicare HMO | Admitting: Psychiatry

## 2018-02-07 ENCOUNTER — Encounter (HOSPITAL_COMMUNITY): Payer: Self-pay | Admitting: Psychiatry

## 2018-02-07 VITALS — BP 121/77 | HR 78 | Ht 74.0 in | Wt 244.0 lb

## 2018-02-07 DIAGNOSIS — F331 Major depressive disorder, recurrent, moderate: Secondary | ICD-10-CM

## 2018-02-07 DIAGNOSIS — M25561 Pain in right knee: Secondary | ICD-10-CM | POA: Diagnosis not present

## 2018-02-07 DIAGNOSIS — F4310 Post-traumatic stress disorder, unspecified: Secondary | ICD-10-CM | POA: Diagnosis not present

## 2018-02-07 DIAGNOSIS — G894 Chronic pain syndrome: Secondary | ICD-10-CM | POA: Diagnosis not present

## 2018-02-07 DIAGNOSIS — R69 Illness, unspecified: Secondary | ICD-10-CM | POA: Diagnosis not present

## 2018-02-07 DIAGNOSIS — Z79899 Other long term (current) drug therapy: Secondary | ICD-10-CM | POA: Diagnosis not present

## 2018-02-07 DIAGNOSIS — M25562 Pain in left knee: Secondary | ICD-10-CM | POA: Diagnosis not present

## 2018-02-07 MED ORDER — ALPRAZOLAM 0.5 MG PO TABS: 0.2500 mg | ORAL_TABLET | Freq: Two times a day (BID) | ORAL | 2 refills | Status: DC | PRN

## 2018-02-07 MED ORDER — SERTRALINE HCL 50 MG PO TABS: 50.0000 mg | ORAL_TABLET | Freq: Every day | ORAL | 0 refills | Status: DC

## 2018-02-07 MED ORDER — LURASIDONE HCL 20 MG PO TABS: 20.0000 mg | ORAL_TABLET | Freq: Every day | ORAL | 0 refills | Status: DC

## 2018-02-07 NOTE — Patient Instructions (Signed)
1. Continue sertraline 50 mg daily  2. Continue latuda 20 mg daily  3.Continue Xanax 0.5 mg twice a day as needed for anxiety 4.Return to clinic inthree monthsfor 15 mins

## 2018-02-20 ENCOUNTER — Telehealth: Payer: Self-pay | Admitting: Orthopaedic Surgery

## 2018-02-20 NOTE — Telephone Encounter (Signed)
Patient called, states he has seen the pain management provider, Merryl HackerJanelle Grossman, requests copy of MRI report.  Would it have been sent with referral?  Please advise/please send.

## 2018-02-22 NOTE — Telephone Encounter (Signed)
MRI results faxed.

## 2018-03-03 ENCOUNTER — Other Ambulatory Visit: Payer: Self-pay | Admitting: Nurse Practitioner

## 2018-03-03 DIAGNOSIS — K7030 Alcoholic cirrhosis of liver without ascites: Secondary | ICD-10-CM

## 2018-03-03 DIAGNOSIS — R600 Localized edema: Secondary | ICD-10-CM

## 2018-03-04 DIAGNOSIS — G894 Chronic pain syndrome: Secondary | ICD-10-CM | POA: Diagnosis not present

## 2018-03-04 DIAGNOSIS — M25562 Pain in left knee: Secondary | ICD-10-CM | POA: Diagnosis not present

## 2018-03-04 DIAGNOSIS — Z79899 Other long term (current) drug therapy: Secondary | ICD-10-CM | POA: Diagnosis not present

## 2018-03-04 DIAGNOSIS — M25561 Pain in right knee: Secondary | ICD-10-CM | POA: Diagnosis not present

## 2018-03-12 ENCOUNTER — Telehealth: Payer: Self-pay | Admitting: *Deleted

## 2018-03-12 ENCOUNTER — Encounter: Payer: Self-pay | Admitting: *Deleted

## 2018-03-12 ENCOUNTER — Ambulatory Visit (INDEPENDENT_AMBULATORY_CARE_PROVIDER_SITE_OTHER): Payer: Medicare HMO | Admitting: Nurse Practitioner

## 2018-03-12 ENCOUNTER — Encounter: Payer: Self-pay | Admitting: Nurse Practitioner

## 2018-03-12 ENCOUNTER — Other Ambulatory Visit: Payer: Self-pay | Admitting: *Deleted

## 2018-03-12 VITALS — BP 131/81 | HR 82 | Temp 98.8°F | Ht 74.0 in | Wt 240.0 lb

## 2018-03-12 DIAGNOSIS — B182 Chronic viral hepatitis C: Secondary | ICD-10-CM

## 2018-03-12 DIAGNOSIS — I8510 Secondary esophageal varices without bleeding: Secondary | ICD-10-CM | POA: Diagnosis not present

## 2018-03-12 DIAGNOSIS — R69 Illness, unspecified: Secondary | ICD-10-CM | POA: Diagnosis not present

## 2018-03-12 DIAGNOSIS — F1011 Alcohol abuse, in remission: Secondary | ICD-10-CM | POA: Diagnosis not present

## 2018-03-12 DIAGNOSIS — K7030 Alcoholic cirrhosis of liver without ascites: Secondary | ICD-10-CM | POA: Diagnosis not present

## 2018-03-12 NOTE — Assessment & Plan Note (Signed)
History of alcohol abuse.  No alcohol in just over 1 year.  He was congratulated on this accomplishment.  Recommended to continue the abstain from alcohol.  He verbalized understanding and his intent to do so.  Follow-up in 6 months.

## 2018-03-12 NOTE — Assessment & Plan Note (Signed)
Previously documented hepatitis C positive.  [facility] due to his decompensated state.  He states he finished his treatment sometime in the summer.  Labs were checked 3 months after completing treatment and found no sign of virus.  Presumed documented SVR 12/care.  Recommend he avoid any high risk behaviors that could lead to reinfection.  Follow-up in 6 months.

## 2018-03-12 NOTE — Telephone Encounter (Signed)
Pre-op scheduled for 05/21/18 at 12:45pm. Patient aware. Letter mailed.

## 2018-03-12 NOTE — Progress Notes (Signed)
Referring Provider: Saralyn Pilar * Primary Care Physician:  Smitty Cords, DO Primary GI:  Dr. Darrick Penna  Chief Complaint  Patient presents with  . Cirrhosis    decided against liver transplant    HPI:   Christian Sanders is a 57 y.o. male who presents for follow-up on cirrhosis.  The patient was last seen in our office 09/06/2017 for alcoholic cirrhosis, portal hypertension, chronic hepatitis C, esophageal varices.  Known history of alcoholic cirrhosis, portal hypertension, splenomegaly.  Diagnosed with cirrhosis in the emergency department.  Previously drank about a case of beer a day, at his last visit he was down to 1 or 2 drinks a day.  He was counseled on alcohol cessation.  Ultrasound elastography found some F3 and F4 with high risk of fibrosis.  Work-up included normal viral hepatitis serologies for a and B, hepatitis C was canceled by the lab for some unknown reason.  His meld score was calculated at 15, child Pugh B in 2018.  Elevated AFP recommended MRI which was completed and found cirrhosis but no liver masses.  Noted splenomegaly.  History of nausea and mild vomiting if he takes Jordan.  Colonoscopy up-to-date 2017 and recommended repeat in 10 years.  Follow-up hepatitis C antibody came back positive.  Recommended complete alcohol cessation prior to treatment.  He is interested in hepatitis C treatment.  History of multiple EGDs for variceal band ligation with grade 3 varices status post eradication with band ligation on 04/17/2017.  Repeat 05/22/2017 found grade 2 and 3 varices status post eradication with band ligation.  Repeat EGD 06/26/2017 found grade 3 varices eradicated with banding x2, portal hypertensive gastropathy.  Recommended repeat EGD in 3 months.  He was referred to the liver clinic in [facility] for hepatitis C treatment due to decompensated liver disease.  At his last visit he noted some increased lower extremity edema, saw Doristine Devoid, NP at the liver  clinic in [place].  He is on Epclusa and ribavirin, no detectable virus at 4 weeks.  Denies any other overt hepatic symptoms.  Recommended consider addition of Aldactone to Lasix.  Schedule EGD with band ligation, follow-up labs, abdominal imaging, follow-up in our office in 6 months.  EGD completed 09/11/2017 which found 5 columns of nonbleeding grade 1 and grade 2 esophageal varices, moderate portal hypertensive gastropathy.  Recommended continue current medications, complete treatment for hepatitis C, repeat EGD in 3 months (September 2019) for retreatment.  Today he states he's doing ok overall. The diuretics have helped a lot. He has decided against any possible transplant. He is being released back to our care form [facility] clinic. Has been drinking Ensure for supplement and nutrition, feels the best flavor is Vanilla. Denies yellowing of skin/eyes, darkened urine, acute episodic confusion, generalized pruritis, generalized pruritis. Denies chest pain, dyspnea, dizziness, lightheadedness, syncope, near syncope. Denies any other upper or lower GI symptoms. Denies excessive bleeding or bruising.  He finished HCV treatment and, per the patient, had SVR12/cure.  Past Medical History:  Diagnosis Date  . Abnormal CT of liver 12/20/2016  . Anxiety   . Bipolar disorder (HCC)   . Cirrhosis (HCC)   . Dentures complicating chewing    full upper, partial lower  . Depression   . Esophageal varices determined by endoscopy (HCC)   . Gastritis and duodenitis   . GERD (gastroesophageal reflux disease)   . Headache    chronic, s/p facial injury and reconstruction  . Hx of hepatitis C   .  Hypertension   . Portal hypertensive gastropathy (HCC)   . PTSD (post-traumatic stress disorder)   . Vertebral column disorder    2 crushed thoracic vertebra    Past Surgical History:  Procedure Laterality Date  . BIOPSY  04/17/2017   Procedure: BIOPSY;  Surgeon: West BaliFields, Sandi L, MD;  [facility];  Service: Endoscopy;;  gastric for h pylori  . COLONOSCOPY WITH PROPOFOL N/A 10/09/2014   Procedure: COLONOSCOPY WITH PROPOFOL;  Surgeon: Midge Miniumarren Wohl, MD;  [facility];  Service: Endoscopy;  Laterality: N/A;  . ESOPHAGEAL BANDING N/A 04/17/2017   Procedure: ESOPHAGEAL BANDING;  Surgeon: West BaliFields, Sandi L, MD;  [facility];  Service: Endoscopy;  Laterality: N/A;  . ESOPHAGEAL BANDING N/A 05/22/2017   Procedure: ESOPHAGEAL BANDING;  Surgeon: West BaliFields, Sandi L, MD;  [facility];  Service: Endoscopy;  Laterality: N/A;  . ESOPHAGEAL BANDING N/A 06/26/2017   Procedure: ESOPHAGEAL BANDING;  Surgeon: West BaliFields, Sandi L, MD;  [facility];  Service: Endoscopy;  Laterality: N/A;  . ESOPHAGOGASTRODUODENOSCOPY (EGD) WITH PROPOFOL N/A 04/17/2017   Procedure: ESOPHAGOGASTRODUODENOSCOPY (EGD) WITH PROPOFOL;  Surgeon: West BaliFields, Sandi L, MD;  [facility];  Service: Endoscopy;  Laterality: N/A;  10:45am  . ESOPHAGOGASTRODUODENOSCOPY (EGD) WITH PROPOFOL N/A 05/22/2017   Procedure: ESOPHAGOGASTRODUODENOSCOPY (EGD) WITH PROPOFOL;  Surgeon: West BaliFields, Sandi L, MD;  [facility];  Service: Endoscopy;  Laterality: N/A;  . ESOPHAGOGASTRODUODENOSCOPY (EGD) WITH PROPOFOL N/A 06/26/2017   Procedure: ESOPHAGOGASTRODUODENOSCOPY (EGD) WITH PROPOFOL;  Surgeon: West BaliFields, Sandi L, MD;  [facility];  Service: Endoscopy;  Laterality: N/A;  1:45pm  . ESOPHAGOGASTRODUODENOSCOPY (EGD) WITH PROPOFOL N/A 09/11/2017   Procedure: ESOPHAGOGASTRODUODENOSCOPY (EGD) WITH PROPOFOL;  Surgeon: West BaliFields, Sandi L, MD;  [facility];  Service: Endoscopy;  Laterality: N/A;  10:30am  . FACIAL RECONSTRUCTION SURGERY     eye socket with 2 metal plates    Current Outpatient Medications  Medication Sig Dispense Refill  . ALPRAZolam (XANAX) 0.5 MG tablet Take 0.5-1 tablets (0.25-0.5 mg total) by mouth 2 (two) times daily as needed for anxiety. 60 tablet 2  .  fluticasone (FLONASE) 50 MCG/ACT nasal spray PLACE 2 SPRAYS INTO BOTH NOSTRILS DAILY AS NEEDED FOR ALLERGIES. 48 g 1  . furosemide (LASIX) 20 MG tablet TAKE 1 TABLET BY MOUTH EVERY DAY 90 tablet 3  . lurasidone (LATUDA) 20 MG TABS tablet Take 1 tablet (20 mg total) by mouth daily. 90 tablet 0  . Melatonin 10 MG TABS Take 30 mg by mouth at bedtime.    . methocarbamol (ROBAXIN) 500 MG tablet Take 1 tablet (500 mg total) by mouth 2 (two) times daily. 20 tablet 0  . omeprazole (PRILOSEC) 20 MG capsule TAKE 1 CAPSULE BY MOUTH 30 MINUTES PRIOR TO FIRST MEAL  11  . ondansetron (ZOFRAN-ODT) 4 MG disintegrating tablet Take 1 tablet (4 mg total) by mouth every 8 (eight) hours as needed (for nausea/vomiting.). 90 tablet 2  . pregabalin (LYRICA) 50 MG capsule Take 50 mg by mouth 2 (two) times daily.  0  . sertraline (ZOLOFT) 50 MG tablet Take 1 tablet (50 mg total) by mouth daily. 90 tablet 0  . sildenafil (REVATIO) 20 MG tablet TAKE 1 TO 5 TABLETS BY MOUTH ONCE DAILY AS NEEDED (Patient taking differently: Take 20-100 mg by mouth as needed (for ED). ) 50 tablet 5  . spironolactone (ALDACTONE) 50 MG tablet TAKE 1 TABLET BY MOUTH EVERY DAY 90 tablet 1  No current facility-administered medications for this visit.     Allergies as of 03/12/2018 - Review Complete 03/12/2018  Allergen Reaction Noted  . Bee venom Swelling and Other (See Comments) 12/28/2015  . Ibuprofen Other (See Comments) 05/11/2017  . Pineapple Nausea And Vomiting 10/06/2014  . Pollen extract Other (See Comments) 06/03/2015  . Tylenol [acetaminophen] Other (See Comments) 05/11/2017    Family History  Problem Relation Age of Onset  . Leukemia Father   . Colon cancer Neg Hx   . Liver disease Neg Hx     Social History   Socioeconomic History  . Marital status: Single    Spouse name: Not on file  . Number of children: Not on file  . Years of education: Not on file  . Highest education level: Not on file  Occupational History  .  Not on file  Social Needs  . Financial resource strain: Not on file  . Food insecurity:    Worry: Not on file    Inability: Not on file  . Transportation needs:    Medical: Not on file    Non-medical: Not on file  Tobacco Use  . Smoking status: Current Every Day Smoker    Packs/day: 0.25    Years: 30.00    Pack years: 7.50    Types: Cigarettes  . Smokeless tobacco: Never Used  Substance and Sexual Activity  . Alcohol use: No    Alcohol/week: 6.0 standard drinks    Types: 6 Cans of beer per week    Frequency: Never    Comment: None as of 03/08/17; previously 1-1.5 cases of beer a day in his 38s.  . Drug use: No  . Sexual activity: Never    Birth control/protection: None  Lifestyle  . Physical activity:    Days per week: Not on file    Minutes per session: Not on file  . Stress: Not on file  Relationships  . Social connections:    Talks on phone: Not on file    Gets together: Not on file    Attends religious service: Not on file    Active member of club or organization: Not on file    Attends meetings of clubs or organizations: Not on file    Relationship status: Not on file  Other Topics Concern  . Not on file  Social History Narrative  . Not on file    Review of Systems: Complete ROS negative except as per HPI.   Physical Exam: BP 131/81   Pulse 82   Temp 98.8 F (37.1 C) (Oral)   Ht 6\' 2"  (1.88 m)   Wt 240 lb (108.9 kg)   BMI 30.81 kg/m  General:   Alert and oriented. Pleasant and cooperative. Well-nourished and well-developed.  Eyes:  Without icterus, sclera clear and conjunctiva pink.  Ears:  Normal auditory acuity. Cardiovascular:  S1, S2 present without murmurs appreciated. Extremities without clubbing or edema. Respiratory:  Clear to auscultation bilaterally. No wheezes, rales, or rhonchi. No distress.  Gastrointestinal:  +BS, soft, non-tender and non-distended. No HSM noted. No guarding or rebound. No masses appreciated.  Rectal:  Deferred    Musculoskalatal:  Symmetrical without gross deformities. Neurologic:  Alert and oriented x4;  grossly normal neurologically. Psych:  Alert and cooperative. Normal mood and affect. Heme/Lymph/Immune: No excessive bruising noted.    03/12/2018 11:21 AM   Disclaimer: This note was dictated with voice recognition software. Similar sounding words can inadvertently be transcribed and may not be corrected  upon review.

## 2018-03-12 NOTE — Progress Notes (Signed)
cc'ed to pcp °

## 2018-03-12 NOTE — Assessment & Plan Note (Signed)
Liver cirrhosis likely multifactorial in nature from alcohol abuse and hepatitis C.  His hepatitis C has since been treated and eradicated.  He is not drinking any alcohol.  Hopefully he might have some improvement, or the status quo moving forward.  He has declined future liver transplant at this time stating "if the good Lord wants me I will let them take me."  Generally asymptomatic from a hepatic standpoint.  Previous meld score 15, Child-Pugh B.  At this point we will recheck routine labs and imaging.  Follow-up in 6 months.

## 2018-03-12 NOTE — Patient Instructions (Signed)
1. Have your labs drawn when you are able to. 2. We will help schedule your ultrasound for you. 3. We will schedule your upper endoscopy with possible banding for you. 4. Return for follow-up in 6 months. 5. Call us if you have any questions or concerns.  At [facility] we value your feedback. You may receive a survey about your visit today. Please share your experience as we strive to create trusting relationships with our patients to provide genuine, compassionate, quality care.  We appreciate your understanding and patience as we review any laboratory studies, imaging, and other diagnostic tests that are ordered as we care for you. Our office policy is 5 business days for review of these results, and any emergent or urgent results are addressed in a timely manner for your best interest. If you do not hear from our office in 1 week, please contact us.   We also encourage the use of MyChart, which contains your medical information for your review as well. If you are not enrolled in this feature, an access code is on this after visit summary for your convenience. Thank you for allowing us to be involved in your care.  It was great to see you today!  I hope you have a Merry Christmas!!

## 2018-03-12 NOTE — Assessment & Plan Note (Signed)
He has had multiple esophageal varices.  He has had multiple EGDs for a program of variceal band ligation.  He is currently due for repeat EGD.  We will update his labs and imaging.  We will schedule him for upper endoscopy with ligation.  Further recommendations to follow.  Follow-up in 6 months.  Proceed with EGD with possible variceal band ligation on propofol/MAC with Dr. Oneida Alar in near future: the risks, benefits, and alternatives have been discussed with the patient in detail. The patient states understanding and desires to proceed.  The patient is currently on Xanax, Robaxin, Lyrica, Zoloft.  No other anticoagulants, anxiolytics, chronic pain medications, or antidepressants.  He does have a history of alcohol abuse.  Given this we will plan for the procedure on propofol/MAC to promote adequate sedation.

## 2018-03-13 DIAGNOSIS — R69 Illness, unspecified: Secondary | ICD-10-CM | POA: Diagnosis not present

## 2018-03-13 DIAGNOSIS — I8510 Secondary esophageal varices without bleeding: Secondary | ICD-10-CM | POA: Diagnosis not present

## 2018-03-13 DIAGNOSIS — B182 Chronic viral hepatitis C: Secondary | ICD-10-CM | POA: Diagnosis not present

## 2018-03-14 ENCOUNTER — Other Ambulatory Visit (HOSPITAL_COMMUNITY): Payer: Self-pay | Admitting: Psychiatry

## 2018-03-14 ENCOUNTER — Telehealth (HOSPITAL_COMMUNITY): Payer: Self-pay | Admitting: *Deleted

## 2018-03-14 LAB — CBC WITH DIFFERENTIAL/PLATELET
Basophils Absolute: 49 cells/uL (ref 0–200)
Basophils Relative: 0.6 %
EOS ABS: 287 {cells}/uL (ref 15–500)
Eosinophils Relative: 3.5 %
HEMATOCRIT: 36.9 % — AB (ref 38.5–50.0)
Hemoglobin: 13.3 g/dL (ref 13.2–17.1)
Lymphs Abs: 2058 cells/uL (ref 850–3900)
MCH: 37.5 pg — AB (ref 27.0–33.0)
MCHC: 36 g/dL (ref 32.0–36.0)
MCV: 103.9 fL — AB (ref 80.0–100.0)
MONOS PCT: 8.9 %
MPV: 11.3 fL (ref 7.5–12.5)
Neutro Abs: 5076 cells/uL (ref 1500–7800)
Neutrophils Relative %: 61.9 %
Platelets: 110 10*3/uL — ABNORMAL LOW (ref 140–400)
RBC: 3.55 10*6/uL — AB (ref 4.20–5.80)
RDW: 12.5 % (ref 11.0–15.0)
TOTAL LYMPHOCYTE: 25.1 %
WBC mixed population: 730 cells/uL (ref 200–950)
WBC: 8.2 10*3/uL (ref 3.8–10.8)

## 2018-03-14 LAB — COMPREHENSIVE METABOLIC PANEL
AG Ratio: 0.9 (calc) — ABNORMAL LOW (ref 1.0–2.5)
ALKALINE PHOSPHATASE (APISO): 129 U/L — AB (ref 40–115)
ALT: 37 U/L (ref 9–46)
AST: 62 U/L — AB (ref 10–35)
Albumin: 3.6 g/dL (ref 3.6–5.1)
BUN: 9 mg/dL (ref 7–25)
CHLORIDE: 103 mmol/L (ref 98–110)
CO2: 27 mmol/L (ref 20–32)
Calcium: 9.5 mg/dL (ref 8.6–10.3)
Creat: 0.95 mg/dL (ref 0.70–1.33)
Globulin: 3.9 g/dL (calc) — ABNORMAL HIGH (ref 1.9–3.7)
Glucose, Bld: 124 mg/dL (ref 65–139)
Potassium: 4.2 mmol/L (ref 3.5–5.3)
Sodium: 137 mmol/L (ref 135–146)
TOTAL PROTEIN: 7.5 g/dL (ref 6.1–8.1)
Total Bilirubin: 2.3 mg/dL — ABNORMAL HIGH (ref 0.2–1.2)

## 2018-03-14 LAB — PROTIME-INR
INR: 1.4 — ABNORMAL HIGH
PROTHROMBIN TIME: 13.8 s — AB (ref 9.0–11.5)

## 2018-03-14 LAB — AFP TUMOR MARKER: AFP TUMOR MARKER: 7.9 ng/mL — AB (ref <6.1)

## 2018-03-14 MED ORDER — ALPRAZOLAM 0.5 MG PO TABS: 0.2500 mg | ORAL_TABLET | Freq: Two times a day (BID) | ORAL | 1 refills | Status: DC | PRN

## 2018-03-14 NOTE — Telephone Encounter (Signed)
[facility] is a Engineer, drillingational Shortage on Xanax due Manufacturer Recall. [facility] has in stock requesting refill be sent there. 2nd listed on preferred RX

## 2018-03-14 NOTE — Telephone Encounter (Signed)
ordered

## 2018-03-14 NOTE — Telephone Encounter (Signed)
Dr [facility] there is a National Shortage on Xanax due Manufacturer Recall. [facility] has in stock requesting refill be sent there. 2nd listed on preferred RX  

## 2018-03-19 ENCOUNTER — Ambulatory Visit (HOSPITAL_COMMUNITY)
Admission: RE | Admit: 2018-03-19 | Discharge: 2018-03-19 | Disposition: A | Discharge status: Home / Self Care | Payer: Medicare HMO | Source: Ambulatory Visit | Attending: Nurse Practitioner | Admitting: Nurse Practitioner

## 2018-03-19 DIAGNOSIS — F1011 Alcohol abuse, in remission: Secondary | ICD-10-CM | POA: Insufficient documentation

## 2018-03-19 DIAGNOSIS — K7460 Unspecified cirrhosis of liver: Secondary | ICD-10-CM | POA: Diagnosis not present

## 2018-03-19 DIAGNOSIS — K7030 Alcoholic cirrhosis of liver without ascites: Secondary | ICD-10-CM | POA: Insufficient documentation

## 2018-03-19 DIAGNOSIS — K828 Other specified diseases of gallbladder: Secondary | ICD-10-CM | POA: Diagnosis not present

## 2018-03-19 DIAGNOSIS — K8020 Calculus of gallbladder without cholecystitis without obstruction: Secondary | ICD-10-CM | POA: Insufficient documentation

## 2018-03-19 DIAGNOSIS — I8510 Secondary esophageal varices without bleeding: Secondary | ICD-10-CM | POA: Insufficient documentation

## 2018-03-19 DIAGNOSIS — B182 Chronic viral hepatitis C: Secondary | ICD-10-CM | POA: Diagnosis not present

## 2018-03-19 DIAGNOSIS — R69 Illness, unspecified: Secondary | ICD-10-CM | POA: Diagnosis not present

## 2018-03-21 DIAGNOSIS — K766 Portal hypertension: Secondary | ICD-10-CM | POA: Diagnosis not present

## 2018-03-21 DIAGNOSIS — Z79899 Other long term (current) drug therapy: Secondary | ICD-10-CM | POA: Diagnosis not present

## 2018-03-21 DIAGNOSIS — K7460 Unspecified cirrhosis of liver: Secondary | ICD-10-CM | POA: Diagnosis not present

## 2018-03-21 DIAGNOSIS — M545 Low back pain: Secondary | ICD-10-CM | POA: Diagnosis not present

## 2018-03-21 DIAGNOSIS — Z0000 Encounter for general adult medical examination without abnormal findings: Secondary | ICD-10-CM | POA: Diagnosis not present

## 2018-03-21 DIAGNOSIS — Z0001 Encounter for general adult medical examination with abnormal findings: Secondary | ICD-10-CM | POA: Diagnosis not present

## 2018-03-21 DIAGNOSIS — B1920 Unspecified viral hepatitis C without hepatic coma: Secondary | ICD-10-CM | POA: Diagnosis not present

## 2018-03-21 DIAGNOSIS — R69 Illness, unspecified: Secondary | ICD-10-CM | POA: Diagnosis not present

## 2018-03-21 DIAGNOSIS — I10 Essential (primary) hypertension: Secondary | ICD-10-CM | POA: Diagnosis not present

## 2018-04-11 DIAGNOSIS — Z79899 Other long term (current) drug therapy: Secondary | ICD-10-CM | POA: Diagnosis not present

## 2018-04-11 DIAGNOSIS — M94269 Chondromalacia, unspecified knee: Secondary | ICD-10-CM | POA: Diagnosis not present

## 2018-04-11 DIAGNOSIS — G894 Chronic pain syndrome: Secondary | ICD-10-CM | POA: Diagnosis not present

## 2018-04-17 ENCOUNTER — Encounter: Payer: Self-pay | Admitting: Orthopaedic Surgery

## 2018-04-17 ENCOUNTER — Ambulatory Visit (INDEPENDENT_AMBULATORY_CARE_PROVIDER_SITE_OTHER): Payer: Medicare HMO | Admitting: Orthopaedic Surgery

## 2018-04-17 VITALS — BP 111/69 | HR 71 | Ht 74.0 in | Wt 239.0 lb

## 2018-04-17 DIAGNOSIS — G8929 Other chronic pain: Secondary | ICD-10-CM

## 2018-04-17 DIAGNOSIS — M25561 Pain in right knee: Secondary | ICD-10-CM

## 2018-04-17 NOTE — Progress Notes (Signed)
Patient Christian Sanders, male DOB:01/11/1961, 58 y.o. [id number]  Chief Complaint  Patient presents with  . Knee Pain    HPI  Lawerance CruelKarl Splinter is a 58 y.o. male who has chronic pain and chronic right knee pain. I last saw him in September, 2019 and ordered a MRI of the right knee.  He has not returned until now.  I did sent him to the pain clinic and he has been going there and is doing well he says.  His pain doctor asked he come here today.  I have reviewed the MRI.  It stated: IMPRESSION: Negative for meniscal or ligament tear.  Marked chondromalacia patella at the patellar apex and along the medial facet in the mid and lower pole.  Small Baker's cyst.  I have explained the findings to him.  The patella femoral pain is painful but does not need surgery.  He cannot take NSAIDs so I have recommended rubs such as Aspercreme or BioFreeze.  He has a cane.  I have recommended a knee sleeve also, he has one at home.   Body mass index is 30.69 kg/m.  ROS  Review of Systems  Musculoskeletal: Positive for arthralgias.  All other systems reviewed and are negative.   All other systems reviewed and are negative.  The following is a summary of the past history medically, past history surgically, known current medicines, social history and family history.  This information is gathered electronically by the computer from prior information and documentation.  I review this each visit and have found including this information at this point in the chart is beneficial and informative.    Past Medical History:  Diagnosis Date  . Abnormal CT of liver 12/20/2016  . Anxiety   . Bipolar disorder (HCC)   . Cirrhosis (HCC)   . Dentures complicating chewing    full upper, partial lower  . Depression   . Esophageal varices determined by endoscopy (HCC)   . Gastritis and duodenitis   . GERD (gastroesophageal reflux disease)   . Headache    chronic, s/p facial injury and reconstruction  . Hx  of hepatitis C   . Hypertension   . Portal hypertensive gastropathy (HCC)   . PTSD (post-traumatic stress disorder)   . Vertebral column disorder    2 crushed thoracic vertebra    Past Surgical History:  Procedure Laterality Date  . BIOPSY  04/17/2017   Procedure: BIOPSY;  Surgeon: West BaliFields, Sandi L, MD;  [facility];  Service: Endoscopy;;  gastric for h pylori  . COLONOSCOPY WITH PROPOFOL N/A 10/09/2014   Procedure: COLONOSCOPY WITH PROPOFOL;  Surgeon: Midge Miniumarren Wohl, MD;  [facility];  Service: Endoscopy;  Laterality: N/A;  . ESOPHAGEAL BANDING N/A 04/17/2017   Procedure: ESOPHAGEAL BANDING;  Surgeon: West BaliFields, Sandi L, MD;  [facility];  Service: Endoscopy;  Laterality: N/A;  . ESOPHAGEAL BANDING N/A 05/22/2017   Procedure: ESOPHAGEAL BANDING;  Surgeon: West BaliFields, Sandi L, MD;  [facility];  Service: Endoscopy;  Laterality: N/A;  . ESOPHAGEAL BANDING N/A 06/26/2017   Procedure: ESOPHAGEAL BANDING;  Surgeon: West BaliFields, Sandi L, MD;  [facility];  Service: Endoscopy;  Laterality: N/A;  . ESOPHAGOGASTRODUODENOSCOPY (EGD) WITH PROPOFOL N/A 04/17/2017   Procedure: ESOPHAGOGASTRODUODENOSCOPY (EGD) WITH PROPOFOL;  Surgeon: West BaliFields, Sandi L, MD;  [facility];  Service: Endoscopy;  Laterality: N/A;  10:45am  . ESOPHAGOGASTRODUODENOSCOPY (EGD) WITH PROPOFOL N/A 05/22/2017   Procedure: ESOPHAGOGASTRODUODENOSCOPY (EGD) WITH PROPOFOL;  Surgeon: Darrick PennaFields,  Darleene CleaverSandi L, MD;  [facility];  Service: Endoscopy;  Laterality: N/A;  . ESOPHAGOGASTRODUODENOSCOPY (EGD) WITH PROPOFOL N/A 06/26/2017   Procedure: ESOPHAGOGASTRODUODENOSCOPY (EGD) WITH PROPOFOL;  Surgeon: West BaliFields, Sandi L, MD;  [facility];  Service: Endoscopy;  Laterality: N/A;  1:45pm  . ESOPHAGOGASTRODUODENOSCOPY (EGD) WITH PROPOFOL N/A 09/11/2017   Procedure: ESOPHAGOGASTRODUODENOSCOPY (EGD) WITH PROPOFOL;  Surgeon: West BaliFields, Sandi L, MD;  [facility];  Service:  Endoscopy;  Laterality: N/A;  10:30am  . FACIAL RECONSTRUCTION SURGERY     eye socket with 2 metal plates    Family History  Problem Relation Age of Onset  . Leukemia Father   . Colon cancer Neg Hx   . Liver disease Neg Hx     Social History Social History   Tobacco Use  . Smoking status: Current Every Day Smoker    Packs/day: 0.25    Years: 30.00    Pack years: 7.50    Types: Cigarettes  . Smokeless tobacco: Never Used  Substance Use Topics  . Alcohol use: No    Alcohol/week: 6.0 standard drinks    Types: 6 Cans of beer per week    Frequency: Never    Comment: None as of 03/08/17; previously 1-1.5 cases of beer a day in his 520s.  . Drug use: No    Allergies  Allergen Reactions  . Bee Venom Swelling and Other (See Comments)    Throat and Tongue   . Ibuprofen Other (See Comments)    Due to liver  . Pineapple Nausea And Vomiting  . Pollen Extract Other (See Comments)    Runny nose, itchy, watery eyes, congestion  . Tylenol [Acetaminophen] Other (See Comments)    Due to liver    Current Outpatient Medications  Medication Sig Dispense Refill  . ALPRAZolam (XANAX) 0.5 MG tablet Take 0.5-1 tablets (0.25-0.5 mg total) by mouth 2 (two) times daily as needed for anxiety. 60 tablet 1  . fluticasone (FLONASE) 50 MCG/ACT nasal spray PLACE 2 SPRAYS INTO BOTH NOSTRILS DAILY AS NEEDED FOR ALLERGIES. 48 g 1  . furosemide (LASIX) 20 MG tablet TAKE 1 TABLET BY MOUTH EVERY DAY 90 tablet 3  . lurasidone (LATUDA) 20 MG TABS tablet Take 1 tablet (20 mg total) by mouth daily. 90 tablet 0  . Melatonin 10 MG TABS Take 30 mg by mouth at bedtime.    . methocarbamol (ROBAXIN) 500 MG tablet Take 1 tablet (500 mg total) by mouth 2 (two) times daily. 20 tablet 0  . omeprazole (PRILOSEC) 20 MG capsule TAKE 1 CAPSULE BY MOUTH 30 MINUTES PRIOR TO FIRST MEAL  11  . ondansetron (ZOFRAN-ODT) 4 MG disintegrating tablet Take 1 tablet (4 mg total) by mouth every 8 (eight) hours as needed (for  nausea/vomiting.). 90 tablet 2  . pregabalin (LYRICA) 50 MG capsule Take 50 mg by mouth 2 (two) times daily.  0  . sertraline (ZOLOFT) 50 MG tablet Take 1 tablet (50 mg total) by mouth daily. 90 tablet 0  . sildenafil (REVATIO) 20 MG tablet TAKE 1 TO 5 TABLETS BY MOUTH ONCE DAILY AS NEEDED (Patient taking differently: Take 20-100 mg by mouth as needed (for ED). ) 50 tablet 5  . spironolactone (ALDACTONE) 50 MG tablet TAKE 1 TABLET BY MOUTH EVERY DAY 90 tablet 1   No current facility-administered medications for this visit.      Physical Exam  Blood pressure 111/69, pulse 71, height 6\' 2"  (1.88 m), weight 239 lb (108.4 kg).  Constitutional: overall normal hygiene, normal nutrition, well developed, normal grooming, normal body habitus. Assistive device:cane  Musculoskeletal: gait and station Limp right, muscle tone and strength are normal, no tremors or atrophy is present.  .  Neurological: coordination overall normal.  Deep tendon reflex/nerve stretch intact.  Sensation normal.  Cranial nerves II-XII intact.   Skin:   Normal overall no scars, lesions, ulcers or rashes. No psoriasis.  Psychiatric: Alert and oriented x 3.  Recent memory intact, remote memory unclear.  Normal mood and affect. Well groomed.  Good eye contact.  Cardiovascular: overall no swelling, no varicosities, no edema bilaterally, normal temperatures of the legs and arms, no clubbing, cyanosis and good capillary refill.  Lymphatic: palpation is normal.  The right lower extremity is examined:  Inspection:  Thigh:  Non-tender and no defects  Knee has swelling 1/2+ effusion.                        Joint tenderness is present                        Patient is tender over the medial joint line  Lower Leg:  Has normal appearance and no tenderness or defects  Ankle:  Non-tender and no defects  Foot:  Non-tender and no defects Range of Motion:  Knee:  Range of motion is: 0-110                        Crepitus is   present  Ankle:  Range of motion is normal. Strength and Tone:  The right lower extremity has normal strength and tone. Stability:  Knee:  The knee is stable.  Ankle:  The ankle is stable. All other systems reviewed and are negative   The patient has been educated about the nature of the problem(s) and counseled on treatment options.  The patient appeared to understand what I have discussed and is in agreement with it.  Encounter Diagnosis  Name Primary?  . Chronic pain of right knee Yes    PLAN Call if any problems.  Precautions discussed.  Continue current medications.   Return to clinic prn   Electronically Signed Darreld Mclean, MD 1/15/20209:19 AM

## 2018-04-23 ENCOUNTER — Other Ambulatory Visit: Payer: Self-pay | Admitting: Family Medicine

## 2018-04-23 DIAGNOSIS — J3089 Other allergic rhinitis: Secondary | ICD-10-CM

## 2018-04-25 ENCOUNTER — Other Ambulatory Visit (HOSPITAL_COMMUNITY): Payer: Self-pay | Admitting: Gastroenterology

## 2018-05-01 ENCOUNTER — Other Ambulatory Visit: Payer: Self-pay | Admitting: Family Medicine

## 2018-05-01 DIAGNOSIS — I10 Essential (primary) hypertension: Secondary | ICD-10-CM
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
## 2018-05-10 DIAGNOSIS — Z79899 Other long term (current) drug therapy: Secondary | ICD-10-CM | POA: Diagnosis not present

## 2018-05-10 DIAGNOSIS — M1990 Unspecified osteoarthritis, unspecified site: Secondary | ICD-10-CM | POA: Diagnosis not present

## 2018-05-10 DIAGNOSIS — G894 Chronic pain syndrome: Secondary | ICD-10-CM | POA: Diagnosis not present

## 2018-05-13 ENCOUNTER — Ambulatory Visit (INDEPENDENT_AMBULATORY_CARE_PROVIDER_SITE_OTHER): Payer: Medicare HMO | Admitting: Psychiatry

## 2018-05-13 ENCOUNTER — Encounter (HOSPITAL_COMMUNITY): Payer: Self-pay | Admitting: Psychiatry

## 2018-05-13 VITALS — BP 116/74 | HR 67 | Ht 74.0 in | Wt 235.0 lb

## 2018-05-13 DIAGNOSIS — F330 Major depressive disorder, recurrent, mild: Secondary | ICD-10-CM | POA: Diagnosis not present

## 2018-05-13 DIAGNOSIS — F410 Panic disorder [episodic paroxysmal anxiety] without agoraphobia: Secondary | ICD-10-CM

## 2018-05-13 DIAGNOSIS — R69 Illness, unspecified: Secondary | ICD-10-CM | POA: Diagnosis not present

## 2018-05-13 DIAGNOSIS — F4310 Post-traumatic stress disorder, unspecified: Secondary | ICD-10-CM

## 2018-05-13 MED ORDER — LURASIDONE HCL 20 MG PO TABS: 20.0000 mg | ORAL_TABLET | Freq: Every day | ORAL | 0 refills | Status: DC

## 2018-05-13 MED ORDER — ALPRAZOLAM 0.5 MG PO TABS: ORAL_TABLET | ORAL | 0 refills | Status: DC

## 2018-05-13 MED ORDER — SERTRALINE HCL 50 MG PO TABS: 50.0000 mg | ORAL_TABLET | Freq: Every day | ORAL | 0 refills | Status: DC

## 2018-05-13 NOTE — Patient Instructions (Signed)
1.Continuesertraline 50 mg daily  2. Continue latuda 20 mg daily  3. Decrease Xanax 0.5 mg daily for two weeks, then 0.25 mg daily for two weeks, then discontinue 4.Return to clinic inthreemonthsfor 15 mins

## 2018-05-20 NOTE — Patient Instructions (Signed)
20    Your procedure is scheduled on: 05/28/2018  Report to [facility] at   13:15  PM.  Call this number if you have problems the morning of surgery: [phone]   Remember:   Do not drink or eat food:After Midnight.      Take these medicines the morning of surgery with A SIP OF WATER: Flonase, Latuda, Omeprazole, Zoloft and xanax if needed   Do not wear jewelry, make-up or nail polish.  Do not wear lotions, powders, or perfumes. You may wear deodorant.    Do not bring valuables to the hospital.  Contacts, dentures or bridgework may not be worn into surgery.  Leave suitcase in the car. After surgery it may be brought to your room.  For patients admitted to the hospital, checkout time is 11:00 AM the day of discharge.   Patients discharged the day of surgery will not be allowed to drive home.  Name and phone number of your driver:       Endoscopy Care After Please read the instructions outlined below and refer to this sheet in the next few weeks. These discharge instructions provide you with general information on caring for yourself after you leave the hospital. Your doctor may also give you specific instructions. While your treatment has been planned according to the most current medical practices available, unavoidable complications occasionally occur. If you have any problems or questions after discharge, please call your doctor. HOME CARE INSTRUCTIONS Activity  You may resume your regular activity but move at a slower pace for the next 24 hours.   Take frequent rest periods for the next 24 hours.   Walking will help expel (get rid of) the air and reduce the bloated feeling in your abdomen.   No driving for 24 hours (because of the anesthesia (medicine) used during the test).   You may shower.   Do not sign any important legal documents or operate any machinery for 24 hours (because of the anesthesia used during the test).  Nutrition  Drink plenty of fluids.   You may  resume your normal diet.   Begin with a light meal and progress to your normal diet.   Avoid alcoholic beverages for 24 hours or as instructed by your caregiver.  Medications You may resume your normal medications unless your caregiver tells you otherwise. What you can expect today  You may experience abdominal discomfort such as a feeling of fullness or "gas" pains.   You may experience a sore throat for 2 to 3 days. This is normal. Gargling with salt water may help this.  Follow-up Your doctor will discuss the results of your test with you. SEEK IMMEDIATE MEDICAL CARE IF:  You have excessive nausea (feeling sick to your stomach) and/or vomiting.   You have severe abdominal pain and distention (swelling).   You have trouble swallowing.   You have a temperature over 100 F (37.8 C).   You have rectal bleeding or vomiting of blood.  Document Released: 11/02/2003 Document Revised: 03/09/2011 Document Reviewed: 05/15/2007

## 2018-05-21 ENCOUNTER — Telehealth: Payer: Self-pay

## 2018-05-21 ENCOUNTER — Encounter (HOSPITAL_COMMUNITY): Payer: Self-pay

## 2018-05-21 ENCOUNTER — Encounter (HOSPITAL_COMMUNITY)
Admission: RE | Admit: 2018-05-21 | Discharge: 2018-05-21 | Disposition: A | Discharge status: Home / Self Care | Payer: Medicare HMO | Source: Ambulatory Visit | Attending: Gastroenterology | Admitting: Gastroenterology
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
## 2018-05-21 NOTE — Telephone Encounter (Signed)
-----   Message from Jennings Books sent at 05/21/2018  2:18 PM EST ----- Regarding: FW: No show for PAT FYI ----- Message ----- From: Elsie Amis, RN Sent: 05/21/2018   1:18 PM EST To: Jennings Books Subject: No show for PAT                                Patient was a no show for PAT today.

## 2018-05-24 NOTE — Telephone Encounter (Signed)
Noted  

## 2018-05-28 ENCOUNTER — Ambulatory Visit (HOSPITAL_COMMUNITY): Admission: RE | Admit: 2018-05-28 | Payer: Medicare HMO | Source: Home / Self Care | Admitting: Gastroenterology

## 2018-05-28 ENCOUNTER — Encounter (HOSPITAL_COMMUNITY): Admission: RE | Payer: Self-pay | Source: Home / Self Care

## 2018-05-28 SURGERY — ESOPHAGOGASTRODUODENOSCOPY (EGD) WITH PROPOFOL
Anesthesia: Monitor Anesthesia Care

## 2018-06-03 DIAGNOSIS — G894 Chronic pain syndrome: Secondary | ICD-10-CM | POA: Diagnosis not present

## 2018-06-03 DIAGNOSIS — M1990 Unspecified osteoarthritis, unspecified site: Secondary | ICD-10-CM | POA: Diagnosis not present

## 2018-06-03 DIAGNOSIS — Z79899 Other long term (current) drug therapy: Secondary | ICD-10-CM | POA: Diagnosis not present
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
## 2018-08-12 ENCOUNTER — Ambulatory Visit (HOSPITAL_COMMUNITY): Payer: Medicare Other | Admitting: Psychiatry

## 2018-08-12 ENCOUNTER — Other Ambulatory Visit: Payer: Self-pay

## 2018-08-25 ENCOUNTER — Other Ambulatory Visit: Payer: Self-pay | Admitting: Gastroenterology

## 2018-08-25 DIAGNOSIS — K7030 Alcoholic cirrhosis of liver without ascites: Secondary | ICD-10-CM

## 2018-08-25 DIAGNOSIS — R600 Localized edema: Secondary | ICD-10-CM

## 2018-09-11 ENCOUNTER — Encounter: Payer: Self-pay | Admitting: Gastroenterology

## 2018-09-11 ENCOUNTER — Ambulatory Visit (INDEPENDENT_AMBULATORY_CARE_PROVIDER_SITE_OTHER): Payer: Medicare Other | Admitting: Nurse Practitioner

## 2018-09-11 ENCOUNTER — Other Ambulatory Visit: Payer: Self-pay

## 2018-09-11 ENCOUNTER — Encounter: Payer: Self-pay | Admitting: Nurse Practitioner

## 2018-09-11 ENCOUNTER — Telehealth: Payer: Self-pay

## 2018-09-11 VITALS — BP 132/83 | HR 85 | Temp 97.0°F | Ht 74.0 in | Wt 243.4 lb

## 2018-09-11 DIAGNOSIS — I8500 Esophageal varices without bleeding: Secondary | ICD-10-CM | POA: Diagnosis not present

## 2018-09-11 DIAGNOSIS — K7030 Alcoholic cirrhosis of liver without ascites: Secondary | ICD-10-CM

## 2018-09-11 DIAGNOSIS — K5900 Constipation, unspecified: Secondary | ICD-10-CM

## 2018-09-11 NOTE — Patient Instructions (Signed)
Attempted to submit PA for [place] RUQ via [employer]. Per [employer] website: NOTE: This [facility] member does not require prior authorization for OUTPATIENT Radiology through [facility] or [facility] at this time.

## 2018-09-11 NOTE — Patient Instructions (Signed)
Your health issues we discussed today were:   Cirrhosis with swollen blood vessels in your esophagus: 1. Have your labs drawn when you are able to 2. We will help schedule your ultrasound of your liver 3. We will schedule your upper endoscopy with possible banding of your swollen blood vessels.  We may alternatively recommend another medication to help prevent worsening of swollen blood vessels 4. Further recommendations will follow your procedure  Abdominal pain and constipation: 1. I am sorry you are having abdominal pain and difficulty having a bowel movement 2. I am giving you samples of Linzess 72 mcg.  Take this once a day, on an empty stomach. 3. Call us in 1 week and let us know if it is helping you have better bowel movements 4. Call us if you have any worsening or severe symptoms  Overall I recommend:  1. Continue your current medications 2. Return for follow-up in 2 months 3. Call us if you have any questions or concerns.   Because of recent events of COVID-19 ("Coronavirus"), follow CDC recommendations:  1. Wash your hand frequently 2. Avoid touching your face 3. Stay away from people who are sick 4. If you have symptoms such as fever, cough, shortness of breath then call your healthcare provider for further guidance 5. If you are sick, STAY AT HOME unless otherwise directed by your healthcare provider. 6. Follow directions from state and national officials regarding staying safe   At [facility] we value your feedback. You may receive a survey about your visit today. Please share your experience as we strive to create trusting relationships with our patients to provide genuine, compassionate, quality care.  We appreciate your understanding and patience as we review any laboratory studies, imaging, and other diagnostic tests that are ordered as we care for you. Our office policy is 5 business days for review of these results, and any emergent or urgent  results are addressed in a timely manner for your best interest. If you do not hear from our office in 1 week, please contact us.   We also encourage the use of MyChart, which contains your medical information for your review as well. If you are not enrolled in this feature, an access code is on this after visit summary for your convenience. Thank you for allowing [place] to be involved in your care.  It was great to see you today!  I hope you have a great day!!

## 2018-09-11 NOTE — Progress Notes (Signed)
Referring Provider: Saralyn PilarKaramalegos, Alexander * Primary Care Physician:  Marylynn PearsonMcCorkle, Tenika, FNP Primary GI:  Dr. Darrick PennaFields  Chief Complaint  Patient presents with  . Hepatitis C    f/u  . Abdominal Pain    left side and across x 1 month, constant pain, dull feeling  . Nausea    no vomiting    HPI:   Christian Sanders is a 58 y.o. male who presents for follow-up on hepatitis C, alcoholic cirrhosis, esophageal varices.  He was last seen in our office 03/12/2018.  Known history of the above as well as per hypertension, splenomegaly.  Previously drank a case of beer a day and it last visit was down to 1-2 drinks a day.  Ongoing recommendations for alcohol abstinence.  Meld score historically 15, child Pugh class B.  [place].  He was treated with Epclusa and ribavirin with no detectable virus at 4 weeks.  EGD completed 09/11/2016 which found 5 columns of nonbleeding grade 1 and grade 2 esophageal varices, moderate portal hypertensive gastropathy, recommended continue current medications to complete hep C treatment and repeat EGD in September 2019.  At his last visit he noted diuretics have helped a lot.  He has decided against liver transplant and is being [facility].  He is drinking Ensure for supplement and nutrition.  No overt hepatic symptoms.  Recommended updated liver labs, right upper quadrant ultrasound, EGD with possible banding, follow-up in 6 months.  EGD was initially scheduled for 05/28/2018 but it was canceled due to his co-pay.  Labs completed 03/13/2018 which found improvement in anemia, platelet count.  CMP stable, INR stable/improved, AFP tumor marker normal.  Overall his meld score improved from 15 to 13 and child Pugh class improved from B to A.  Today he states he's been having a lot of pain and doesn't have any pain medications. Having abdominal pain mid abdomen which  is constant and dull. Pain started about 2 weeks ago. Denies diarrhea. Typically has to have a bowel movement when he has his pain, sometimes pain improves after a bowel movement. States sometimes hard to have a bowel movement. Has a bowel movement about 1-2 times a week, with straining and hard stools. Not on any medication for constipation. Denies hematochezia, melena, fever, chills, unintetnional weight loss. Had subjective yellowing of his skin which he states he current and has been since taking Lyrica (no obvious jaundice appearance.) Denies darkened urine, acute episodic confusion, tremors/shakes. Denies URI or flu-like symptoms. Denies loss of sense of smell. States he lost some sense of taste a week ago; denies fever, cough, shortness of breath. Denies chest pain, dyspnea, dizziness, lightheadedness, syncope, near syncope. Denies any other upper or lower GI symptoms.  Past Medical History:  Diagnosis Date  . Abnormal CT of liver 12/20/2016  . Anxiety   . Bipolar disorder (HCC)   . Cirrhosis (HCC)   . Dentures complicating chewing    full upper, partial lower  . Depression   . Esophageal varices determined by endoscopy (HCC)   . Gastritis and duodenitis   . GERD (gastroesophageal reflux disease)   . Headache    chronic, s/p facial injury and reconstruction  . Hx of hepatitis C   . Hypertension   . Portal hypertensive gastropathy (HCC)   . PTSD (post-traumatic stress disorder)   . Vertebral column disorder    2 crushed thoracic vertebra  Past Surgical History:  Procedure Laterality Date  . BIOPSY  04/17/2017   Procedure: BIOPSY;  Surgeon: West BaliFields, Sandi L, MD;  [facility];  Service: Endoscopy;;  gastric for h pylori  . COLONOSCOPY WITH PROPOFOL N/A 10/09/2014   Procedure: COLONOSCOPY WITH PROPOFOL;  Surgeon: Midge Miniumarren Wohl, MD;  [facility];  Service: Endoscopy;  Laterality: N/A;  . ESOPHAGEAL BANDING N/A 04/17/2017   Procedure: ESOPHAGEAL BANDING;   Surgeon: West BaliFields, Sandi L, MD;  [facility];  Service: Endoscopy;  Laterality: N/A;  . ESOPHAGEAL BANDING N/A 05/22/2017   Procedure: ESOPHAGEAL BANDING;  Surgeon: West BaliFields, Sandi L, MD;  [facility];  Service: Endoscopy;  Laterality: N/A;  . ESOPHAGEAL BANDING N/A 06/26/2017   Procedure: ESOPHAGEAL BANDING;  Surgeon: West BaliFields, Sandi L, MD;  [facility];  Service: Endoscopy;  Laterality: N/A;  . ESOPHAGOGASTRODUODENOSCOPY (EGD) WITH PROPOFOL N/A 04/17/2017   Procedure: ESOPHAGOGASTRODUODENOSCOPY (EGD) WITH PROPOFOL;  Surgeon: West BaliFields, Sandi L, MD;  [facility];  Service: Endoscopy;  Laterality: N/A;  10:45am  . ESOPHAGOGASTRODUODENOSCOPY (EGD) WITH PROPOFOL N/A 05/22/2017   Procedure: ESOPHAGOGASTRODUODENOSCOPY (EGD) WITH PROPOFOL;  Surgeon: West BaliFields, Sandi L, MD;  [facility];  Service: Endoscopy;  Laterality: N/A;  . ESOPHAGOGASTRODUODENOSCOPY (EGD) WITH PROPOFOL N/A 06/26/2017   Procedure: ESOPHAGOGASTRODUODENOSCOPY (EGD) WITH PROPOFOL;  Surgeon: West BaliFields, Sandi L, MD;  [facility];  Service: Endoscopy;  Laterality: N/A;  1:45pm  . ESOPHAGOGASTRODUODENOSCOPY (EGD) WITH PROPOFOL N/A 09/11/2017   Procedure: ESOPHAGOGASTRODUODENOSCOPY (EGD) WITH PROPOFOL;  Surgeon: West BaliFields, Sandi L, MD;  [facility];  Service: Endoscopy;  Laterality: N/A;  10:30am  . FACIAL RECONSTRUCTION SURGERY     eye socket with 2 metal plates    Current Outpatient Medications  Medication Sig Dispense Refill  . Baclofen 5 MG TABS Take 5 mg by mouth 3 (three) times daily as needed (for pain).     . fluticasone (FLONASE) 50 MCG/ACT nasal spray PLACE 2 SPRAYS INTO BOTH NOSTRILS DAILY AS NEEDED FOR ALLERGIES. (Patient taking differently: Place 2 sprays into both nostrils daily as needed for allergies. ) 48 g 1  . furosemide (LASIX) 20 MG tablet TAKE 1 TABLET BY MOUTH EVERY DAY (Patient taking differently: Take 20 mg by mouth daily. ) 90 tablet 3  . lurasidone  (LATUDA) 20 MG TABS tablet Take 1 tablet (20 mg total) by mouth daily. 90 tablet 0  . Melatonin 10 MG TABS Take 10-20 mg by mouth at bedtime as needed (for sleep).     Marland Kitchen. omeprazole (PRILOSEC) 20 MG capsule TAKE 1 CAPSULE BY MOUTH 30 MINUTES PRIOR TO FIRST MEAL (Patient taking differently: Take 20 mg by mouth daily. ) 90 capsule 3  . pregabalin (LYRICA) 75 MG capsule Take 75 mg by mouth 2 (two) times daily.   0  . sertraline (ZOLOFT) 50 MG tablet Take 1 tablet (50 mg total) by mouth daily. 90 tablet 0  . sildenafil (REVATIO) 20 MG tablet TAKE 1 TO 5 TABLETS BY MOUTH ONCE DAILY AS NEEDED (Patient taking differently: Take 20-100 mg by mouth daily as needed (for ED). ) 50 tablet 5  . spironolactone (ALDACTONE) 50 MG tablet TAKE 1 TABLET BY MOUTH EVERY DAY 90 tablet 1   No current facility-administered medications for this visit.     Allergies as of 09/11/2018 - Review Complete 09/11/2018  Allergen Reaction Noted  . Bee venom Swelling and Other (See Comments) 12/28/2015  . Ibuprofen Other (See Comments) 05/11/2017  .  Pineapple Nausea And Vomiting 10/06/2014  . Pollen extract Other (See Comments) 06/03/2015  . Tylenol [acetaminophen] Other (See Comments) 05/11/2017    Family History  Problem Relation Age of Onset  . Leukemia Father   . Colon cancer Neg Hx   . Liver disease Neg Hx     Social History   Socioeconomic History  . Marital status: Single    Spouse name: Not on file  . Number of children: Not on file  . Years of education: Not on file  . Highest education level: Not on file  Occupational History  . Not on file  Social Needs  . Financial resource strain: Not on file  . Food insecurity:    Worry: Not on file    Inability: Not on file  . Transportation needs:    Medical: Not on file    Non-medical: Not on file  Tobacco Use  . Smoking status: Current Every Day Smoker    Packs/day: 0.25    Years: 30.00    Pack years: 7.50    Types: Cigarettes  . Smokeless tobacco:  Never Used  Substance and Sexual Activity  . Alcohol use: No    Alcohol/week: 6.0 standard drinks    Types: 6 Cans of beer per week    Frequency: Never    Comment: None as of 03/08/17; previously 1-1.5 cases of beer a day in his 70s.  . Drug use: No  . Sexual activity: Never    Birth control/protection: None  Lifestyle  . Physical activity:    Days per week: Not on file    Minutes per session: Not on file  . Stress: Not on file  Relationships  . Social connections:    Talks on phone: Not on file    Gets together: Not on file    Attends religious service: Not on file    Active member of club or organization: Not on file    Attends meetings of clubs or organizations: Not on file    Relationship status: Not on file  Other Topics Concern  . Not on file  Social History Narrative  . Not on file    Review of Systems: Complete ROS negative except as per HPI.   Physical Exam: BP 132/83   Pulse 85   Temp (!) 97 F (36.1 C) (Oral)   Ht 6\' 2"  (1.88 m)   Wt 243 lb 6.4 oz (110.4 kg)   BMI 31.25 kg/m  General:   Alert and oriented. Pleasant and cooperative. Well-nourished and well-developed.  Eyes:  Without icterus, sclera clear and conjunctiva pink.  Ears:  Normal auditory acuity. Mouth:  Wearing a mask  Cardiovascular:  S1, S2 present without murmurs appreciated. Extremities without clubbing or edema. Respiratory:  Clear to auscultation bilaterally. No wheezes, rales, or rhonchi. No distress.  Gastrointestinal:  +BS, soft, non-tender and non-distended. No HSM noted. No guarding or rebound. No masses appreciated.  Rectal:  Deferred  Musculoskalatal:  Symmetrical without gross deformities. Skin:  Intact without significant lesions or rashes. Neurologic:  Alert and oriented x4;  grossly normal neurologically. Psych:  Alert and cooperative. Normal mood and affect. Heme/Lymph/Immune: No excessive bruising noted.    09/11/2018 10:50 AM   Disclaimer: This note was dictated with  voice recognition software. Similar sounding words can inadvertently be transcribed and may not be corrected upon review.

## 2018-09-11 NOTE — Assessment & Plan Note (Addendum)
Cirrhosis with portal hypertension and esophageal varices/portal hypertensive gastropathy likely due to combination of hepatitis C and previous alcoholism.  At his last visit he noted that he has cut back on his alcohol intake significantly.  Today he states he has abstained from alcohol for the past several months and plans to continue this.  He was congratulated for his alcohol cessation and recommended he continue to abstain. He is currently due for labs and imaging.  EGD as per above.  Return for follow-up related to his liver in 6 months.  In 2 months more related to abdominal pain and constipation.

## 2018-09-11 NOTE — Telephone Encounter (Signed)
Tried to call pt to inform of pre-op appt 12/27/18 at 10:00am, no answer, LMOVM. Letter mailed.

## 2018-09-11 NOTE — Assessment & Plan Note (Signed)
The patient is currently having abdominal pain.  When discussed further he is likely having constipation causing his abdominal pain.  He has a bowel movement about 1-2 times a week, hard stools, significant straining.  Abdominal pain sometimes improves when he has a bowel movement but not always.  At this point I will trial him on Linzess 72 mcg with samples to last a week.  Request progress report in 1 to 2 weeks for further medication adjustment or agent change based on his response.  Follow-up in 2 months.

## 2018-09-11 NOTE — Assessment & Plan Note (Signed)
Known esophageal varices with EGD most recently completed at the end of 2019.  He was currently being treated for hepatitis C at that time.  He did have 5 columns of nonbleeding grade 1 and grade 2 esophageal varices with moderate portal hypertensive gastropathy.  Recommended repeat EGD in September 2019 after completion of hepatitis C treatment.  He had to cancel his previously scheduled procedure due to his insurance and associated co-pay of $250 despite double insurance.  He states his insurance is changed and is amenable to rescheduling.  We will proceed at this time.  Depending on results he may need esophageal banding versus nonselective beta-blockade.  Proceed with EGD +/- variceal banding on propofol/MAC with Dr. Oneida Alar in near future: the risks, benefits, and alternatives have been discussed with the patient in detail. The patient states understanding and desires to proceed.  The patient is currently on Lyrica, Zoloft, Xanax.  Previously on pain medications.  Previous history of alcoholism with significant Lee decreased drinking.  Given his histories and medications we will plan for the procedure on propofol/MAC to promote adequate sedation.

## 2018-09-12 NOTE — Progress Notes (Signed)
CC'ED TO PCP 

## 2018-09-17 ENCOUNTER — Ambulatory Visit (HOSPITAL_COMMUNITY): Payer: Medicare Other | Attending: Nurse Practitioner

## 2018-09-24 NOTE — Progress Notes (Signed)
REVIEWED-NO ADDITIONAL RECOMMENDATIONS. 

## 2018-10-07 ENCOUNTER — Encounter (HOSPITAL_COMMUNITY): Payer: Self-pay | Admitting: Psychiatry

## 2018-10-07 ENCOUNTER — Telehealth (HOSPITAL_COMMUNITY): Payer: Self-pay | Admitting: Psychiatry

## 2018-10-07 ENCOUNTER — Other Ambulatory Visit: Payer: Self-pay

## 2018-10-07 ENCOUNTER — Ambulatory Visit (INDEPENDENT_AMBULATORY_CARE_PROVIDER_SITE_OTHER): Payer: Medicare Other | Admitting: Psychiatry

## 2018-10-07 ENCOUNTER — Ambulatory Visit (HOSPITAL_COMMUNITY): Payer: Medicare Other

## 2018-10-07 DIAGNOSIS — F4310 Post-traumatic stress disorder, unspecified: Secondary | ICD-10-CM

## 2018-10-07 DIAGNOSIS — F3341 Major depressive disorder, recurrent, in partial remission: Secondary | ICD-10-CM | POA: Diagnosis not present

## 2018-10-07 MED ORDER — ALPRAZOLAM 0.5 MG PO TABS: 0.5000 mg | ORAL_TABLET | Freq: Two times a day (BID) | ORAL | 2 refills | Status: AC | PRN

## 2018-10-07 MED ORDER — SERTRALINE HCL 100 MG PO TABS: 100.0000 mg | ORAL_TABLET | Freq: Every day | ORAL | 0 refills | Status: AC

## 2018-10-07 MED ORDER — LURASIDONE HCL 20 MG PO TABS: 20.0000 mg | ORAL_TABLET | Freq: Every day | ORAL | 0 refills | Status: AC

## 2018-10-07 NOTE — Telephone Encounter (Signed)
Ordered refill for latuda per request. Please contact the patient to make follow up appointment, and notify of no show policy. Will not plan to prescribe any refill without evaluation.

## 2018-10-07 NOTE — Patient Instructions (Signed)
1.Continuesertraline 100 mg daily  2. Continue latuda 20 mg daily 3. Reinitiate xanax 0.5 mg twice a day as needed for anxiety  4.Next appointment: 10/1 at 1 PM
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
## 2018-10-11 ENCOUNTER — Emergency Department (HOSPITAL_COMMUNITY): Payer: Medicare Other

## 2018-10-11 ENCOUNTER — Inpatient Hospital Stay (HOSPITAL_COMMUNITY)
Admission: EM | Admit: 2018-10-11 | Discharge: 2018-11-02 | DRG: 023 | Disposition: E | Payer: Medicare Other | Attending: Pulmonary Disease | Admitting: Pulmonary Disease

## 2018-10-11 DIAGNOSIS — I639 Cerebral infarction, unspecified: Secondary | ICD-10-CM | POA: Diagnosis not present

## 2018-10-11 DIAGNOSIS — Z66 Do not resuscitate: Secondary | ICD-10-CM | POA: Diagnosis not present

## 2018-10-11 DIAGNOSIS — G9389 Other specified disorders of brain: Secondary | ICD-10-CM | POA: Diagnosis present

## 2018-10-11 DIAGNOSIS — Z515 Encounter for palliative care: Secondary | ICD-10-CM

## 2018-10-11 DIAGNOSIS — Z886 Allergy status to analgesic agent status: Secondary | ICD-10-CM

## 2018-10-11 DIAGNOSIS — F1010 Alcohol abuse, uncomplicated: Secondary | ICD-10-CM | POA: Diagnosis present

## 2018-10-11 DIAGNOSIS — G894 Chronic pain syndrome: Secondary | ICD-10-CM | POA: Diagnosis present

## 2018-10-11 DIAGNOSIS — E7220 Disorder of urea cycle metabolism, unspecified: Secondary | ICD-10-CM | POA: Diagnosis not present

## 2018-10-11 DIAGNOSIS — R4182 Altered mental status, unspecified: Secondary | ICD-10-CM | POA: Diagnosis not present

## 2018-10-11 DIAGNOSIS — F17210 Nicotine dependence, cigarettes, uncomplicated: Secondary | ICD-10-CM | POA: Diagnosis present

## 2018-10-11 DIAGNOSIS — D638 Anemia in other chronic diseases classified elsewhere: Secondary | ICD-10-CM | POA: Diagnosis present

## 2018-10-11 DIAGNOSIS — R4701 Aphasia: Secondary | ICD-10-CM | POA: Diagnosis present

## 2018-10-11 DIAGNOSIS — F17200 Nicotine dependence, unspecified, uncomplicated: Secondary | ICD-10-CM | POA: Diagnosis not present

## 2018-10-11 DIAGNOSIS — R29719 NIHSS score 19: Secondary | ICD-10-CM | POA: Diagnosis present

## 2018-10-11 DIAGNOSIS — F4310 Post-traumatic stress disorder, unspecified: Secondary | ICD-10-CM | POA: Diagnosis present

## 2018-10-11 DIAGNOSIS — R739 Hyperglycemia, unspecified: Secondary | ICD-10-CM | POA: Diagnosis present

## 2018-10-11 DIAGNOSIS — F410 Panic disorder [episodic paroxysmal anxiety] without agoraphobia: Secondary | ICD-10-CM | POA: Diagnosis present

## 2018-10-11 DIAGNOSIS — E873 Alkalosis: Secondary | ICD-10-CM | POA: Diagnosis not present

## 2018-10-11 DIAGNOSIS — J9690 Respiratory failure, unspecified, unspecified whether with hypoxia or hypercapnia: Secondary | ICD-10-CM | POA: Diagnosis not present

## 2018-10-11 DIAGNOSIS — D6959 Other secondary thrombocytopenia: Secondary | ICD-10-CM | POA: Diagnosis present

## 2018-10-11 DIAGNOSIS — I619 Nontraumatic intracerebral hemorrhage, unspecified: Secondary | ICD-10-CM | POA: Insufficient documentation

## 2018-10-11 DIAGNOSIS — I6012 Nontraumatic subarachnoid hemorrhage from left middle cerebral artery: Secondary | ICD-10-CM | POA: Diagnosis present

## 2018-10-11 DIAGNOSIS — J690 Pneumonitis due to inhalation of food and vomit: Secondary | ICD-10-CM | POA: Diagnosis not present

## 2018-10-11 DIAGNOSIS — J988 Other specified respiratory disorders: Secondary | ICD-10-CM

## 2018-10-11 DIAGNOSIS — I629 Nontraumatic intracranial hemorrhage, unspecified: Secondary | ICD-10-CM | POA: Diagnosis not present

## 2018-10-11 DIAGNOSIS — F603 Borderline personality disorder: Secondary | ICD-10-CM | POA: Diagnosis present

## 2018-10-11 DIAGNOSIS — Z79899 Other long term (current) drug therapy: Secondary | ICD-10-CM

## 2018-10-11 DIAGNOSIS — G9340 Encephalopathy, unspecified: Secondary | ICD-10-CM | POA: Diagnosis not present

## 2018-10-11 DIAGNOSIS — Z888 Allergy status to other drugs, medicaments and biological substances status: Secondary | ICD-10-CM

## 2018-10-11 DIAGNOSIS — Z6832 Body mass index (BMI) 32.0-32.9, adult: Secondary | ICD-10-CM

## 2018-10-11 DIAGNOSIS — J9600 Acute respiratory failure, unspecified whether with hypoxia or hypercapnia: Secondary | ICD-10-CM | POA: Diagnosis not present

## 2018-10-11 DIAGNOSIS — K7030 Alcoholic cirrhosis of liver without ascites: Secondary | ICD-10-CM

## 2018-10-11 DIAGNOSIS — K219 Gastro-esophageal reflux disease without esophagitis: Secondary | ICD-10-CM | POA: Diagnosis present

## 2018-10-11 DIAGNOSIS — I615 Nontraumatic intracerebral hemorrhage, intraventricular: Secondary | ICD-10-CM | POA: Diagnosis not present

## 2018-10-11 DIAGNOSIS — G936 Cerebral edema: Secondary | ICD-10-CM | POA: Diagnosis present

## 2018-10-11 DIAGNOSIS — K7031 Alcoholic cirrhosis of liver with ascites: Secondary | ICD-10-CM | POA: Diagnosis present

## 2018-10-11 DIAGNOSIS — Z91018 Allergy to other foods: Secondary | ICD-10-CM

## 2018-10-11 DIAGNOSIS — I10 Essential (primary) hypertension: Secondary | ICD-10-CM | POA: Diagnosis present

## 2018-10-11 DIAGNOSIS — Z91030 Bee allergy status: Secondary | ICD-10-CM

## 2018-10-11 DIAGNOSIS — D72829 Elevated white blood cell count, unspecified: Secondary | ICD-10-CM | POA: Diagnosis not present

## 2018-10-11 DIAGNOSIS — K7460 Unspecified cirrhosis of liver: Secondary | ICD-10-CM | POA: Diagnosis not present

## 2018-10-11 DIAGNOSIS — G8191 Hemiplegia, unspecified affecting right dominant side: Secondary | ICD-10-CM | POA: Diagnosis present

## 2018-10-11 DIAGNOSIS — D689 Coagulation defect, unspecified: Secondary | ICD-10-CM | POA: Diagnosis not present

## 2018-10-11 DIAGNOSIS — B1920 Unspecified viral hepatitis C without hepatic coma: Secondary | ICD-10-CM | POA: Diagnosis present

## 2018-10-11 DIAGNOSIS — Z7189 Other specified counseling: Secondary | ICD-10-CM | POA: Diagnosis not present

## 2018-10-11 DIAGNOSIS — F1910 Other psychoactive substance abuse, uncomplicated: Secondary | ICD-10-CM | POA: Diagnosis not present

## 2018-10-11 DIAGNOSIS — E441 Mild protein-calorie malnutrition: Secondary | ICD-10-CM | POA: Diagnosis not present

## 2018-10-11 DIAGNOSIS — E876 Hypokalemia: Secondary | ICD-10-CM | POA: Diagnosis not present

## 2018-10-11 DIAGNOSIS — Z7951 Long term (current) use of inhaled steroids: Secondary | ICD-10-CM

## 2018-10-11 DIAGNOSIS — D684 Acquired coagulation factor deficiency: Secondary | ICD-10-CM | POA: Diagnosis not present

## 2018-10-11 DIAGNOSIS — Z20828 Contact with and (suspected) exposure to other viral communicable diseases: Secondary | ICD-10-CM | POA: Diagnosis present

## 2018-10-11 DIAGNOSIS — G919 Hydrocephalus, unspecified: Secondary | ICD-10-CM | POA: Diagnosis present

## 2018-10-11 DIAGNOSIS — R791 Abnormal coagulation profile: Secondary | ICD-10-CM | POA: Diagnosis not present

## 2018-10-11 DIAGNOSIS — I611 Nontraumatic intracerebral hemorrhage in hemisphere, cortical: Secondary | ICD-10-CM | POA: Diagnosis present

## 2018-10-11 DIAGNOSIS — K7211 Chronic hepatic failure with coma: Secondary | ICD-10-CM | POA: Diagnosis not present

## 2018-10-11 DIAGNOSIS — K7200 Acute and subacute hepatic failure without coma: Secondary | ICD-10-CM | POA: Diagnosis not present

## 2018-10-11 DIAGNOSIS — N400 Enlarged prostate without lower urinary tract symptoms: Secondary | ICD-10-CM | POA: Diagnosis present

## 2018-10-11 DIAGNOSIS — R188 Other ascites: Secondary | ICD-10-CM | POA: Diagnosis not present

## 2018-10-11 DIAGNOSIS — F319 Bipolar disorder, unspecified: Secondary | ICD-10-CM | POA: Diagnosis present

## 2018-10-11 DIAGNOSIS — Z806 Family history of leukemia: Secondary | ICD-10-CM

## 2018-10-11 DIAGNOSIS — D696 Thrombocytopenia, unspecified: Secondary | ICD-10-CM | POA: Diagnosis not present

## 2018-10-11 DIAGNOSIS — E669 Obesity, unspecified: Secondary | ICD-10-CM | POA: Diagnosis present

## 2018-10-11 DIAGNOSIS — F1011 Alcohol abuse, in remission: Secondary | ICD-10-CM | POA: Diagnosis not present

## 2018-10-11 DIAGNOSIS — I610 Nontraumatic intracerebral hemorrhage in hemisphere, subcortical: Secondary | ICD-10-CM | POA: Diagnosis not present

## 2018-10-11 DIAGNOSIS — J9601 Acute respiratory failure with hypoxia: Secondary | ICD-10-CM | POA: Diagnosis present

## 2018-10-11 DIAGNOSIS — F419 Anxiety disorder, unspecified: Secondary | ICD-10-CM | POA: Diagnosis present

## 2018-10-11 DIAGNOSIS — R509 Fever, unspecified: Secondary | ICD-10-CM | POA: Diagnosis not present

## 2018-10-11 DIAGNOSIS — G911 Obstructive hydrocephalus: Secondary | ICD-10-CM | POA: Diagnosis not present

## 2018-10-11 LAB — URINALYSIS, ROUTINE W REFLEX MICROSCOPIC
Bilirubin Urine: NEGATIVE
Glucose, UA: NEGATIVE mg/dL
Hgb urine dipstick: NEGATIVE
Ketones, ur: NEGATIVE mg/dL
Leukocytes,Ua: NEGATIVE
Nitrite: NEGATIVE
Protein, ur: NEGATIVE mg/dL
Specific Gravity, Urine: 1.014 (ref 1.005–1.030)
pH: 7 (ref 5.0–8.0)

## 2018-10-11 LAB — DIFFERENTIAL
Abs Immature Granulocytes: 0.03 10*3/uL (ref 0.00–0.07)
Basophils Absolute: 0.1 10*3/uL (ref 0.0–0.1)
Basophils Relative: 1 %
Eosinophils Absolute: 0.4 10*3/uL (ref 0.0–0.5)
Eosinophils Relative: 5 %
Immature Granulocytes: 0 %
Lymphocytes Relative: 22 %
Lymphs Abs: 2 10*3/uL (ref 0.7–4.0)
Monocytes Absolute: 0.7 10*3/uL (ref 0.1–1.0)
Monocytes Relative: 8 %
Neutro Abs: 5.8 10*3/uL (ref 1.7–7.7)
Neutrophils Relative %: 64 %

## 2018-10-11 LAB — CBC
HCT: 35.7 % — ABNORMAL LOW (ref 39.0–52.0)
Hemoglobin: 12.7 g/dL — ABNORMAL LOW (ref 13.0–17.0)
MCH: 37.1 pg — ABNORMAL HIGH (ref 26.0–34.0)
MCHC: 35.6 g/dL (ref 30.0–36.0)
MCV: 104.4 fL — ABNORMAL HIGH (ref 80.0–100.0)
Platelets: 128 10*3/uL — ABNORMAL LOW (ref 150–400)
RBC: 3.42 MIL/uL — ABNORMAL LOW (ref 4.22–5.81)
RDW: 13.5 % (ref 11.5–15.5)
WBC: 9 10*3/uL (ref 4.0–10.5)
nRBC: 0 % (ref 0.0–0.2)

## 2018-10-11 LAB — POCT I-STAT 7, (LYTES, BLD GAS, ICA,H+H)
Acid-base deficit: 4 mmol/L — ABNORMAL HIGH (ref 0.0–2.0)
Bicarbonate: 19.9 mmol/L — ABNORMAL LOW (ref 20.0–28.0)
Calcium, Ion: 1.22 mmol/L (ref 1.15–1.40)
HCT: 36 % — ABNORMAL LOW (ref 39.0–52.0)
Hemoglobin: 12.2 g/dL — ABNORMAL LOW (ref 13.0–17.0)
O2 Saturation: 100 %
Potassium: 4 mmol/L (ref 3.5–5.1)
Sodium: 139 mmol/L (ref 135–145)
TCO2: 21 mmol/L — ABNORMAL LOW (ref 22–32)
pCO2 arterial: 31.4 mmHg — ABNORMAL LOW (ref 32.0–48.0)
pH, Arterial: 7.409 (ref 7.350–7.450)
pO2, Arterial: 291 mmHg — ABNORMAL HIGH (ref 83.0–108.0)

## 2018-10-11 LAB — RAPID URINE DRUG SCREEN, HOSP PERFORMED
Amphetamines: POSITIVE — AB
Barbiturates: NOT DETECTED
Benzodiazepines: POSITIVE — AB
Cocaine: NOT DETECTED
Opiates: NOT DETECTED
Tetrahydrocannabinol: NOT DETECTED

## 2018-10-11 LAB — COMPREHENSIVE METABOLIC PANEL
ALT: 32 U/L (ref 0–44)
AST: 49 U/L — ABNORMAL HIGH (ref 15–41)
Albumin: 3.4 g/dL — ABNORMAL LOW (ref 3.5–5.0)
Alkaline Phosphatase: 180 U/L — ABNORMAL HIGH (ref 38–126)
Anion gap: 11 (ref 5–15)
BUN: 11 mg/dL (ref 6–20)
CO2: 19 mmol/L — ABNORMAL LOW (ref 22–32)
Calcium: 9.1 mg/dL (ref 8.9–10.3)
Chloride: 107 mmol/L (ref 98–111)
Creatinine, Ser: 0.81 mg/dL (ref 0.61–1.24)
GFR calc Af Amer: >60 mL/min (ref >60)
GFR calc non Af Amer: >60 mL/min (ref >60)
Glucose, Bld: 130 mg/dL — ABNORMAL HIGH (ref 70–99)
Potassium: 3.7 mmol/L (ref 3.5–5.1)
Sodium: 137 mmol/L (ref 135–145)
Total Bilirubin: 1.8 mg/dL — ABNORMAL HIGH (ref 0.3–1.2)
Total Protein: 7.8 g/dL (ref 6.5–8.1)

## 2018-10-11 LAB — PROTIME-INR
INR: 1.4 — ABNORMAL HIGH (ref 0.8–1.2)
Prothrombin Time: 17.4 seconds — ABNORMAL HIGH (ref 11.4–15.2)

## 2018-10-11 LAB — SARS CORONAVIRUS 2 BY RT PCR (HOSPITAL ORDER, PERFORMED IN ~~LOC~~ HOSPITAL LAB): SARS Coronavirus 2: NEGATIVE

## 2018-10-11 LAB — I-STAT CHEM 8, ED
BUN: 9 mg/dL (ref 6–20)
Calcium, Ion: 1.12 mmol/L — ABNORMAL LOW (ref 1.15–1.40)
Chloride: 109 mmol/L (ref 98–111)
Creatinine, Ser: 0.7 mg/dL (ref 0.61–1.24)
Glucose, Bld: 133 mg/dL — ABNORMAL HIGH (ref 70–99)
HCT: 37 % — ABNORMAL LOW (ref 39.0–52.0)
Hemoglobin: 12.6 g/dL — ABNORMAL LOW (ref 13.0–17.0)
Potassium: 3.6 mmol/L (ref 3.5–5.1)
Sodium: 138 mmol/L (ref 135–145)
TCO2: 17 mmol/L — ABNORMAL LOW (ref 22–32)

## 2018-10-11 LAB — LACTIC ACID, PLASMA: Lactic Acid, Venous: 3.2 mmol/L (ref 0.5–1.9)

## 2018-10-11 LAB — AMMONIA: Ammonia: 55 umol/L — ABNORMAL HIGH (ref 9–35)

## 2018-10-11 LAB — TROPONIN I (HIGH SENSITIVITY): Troponin I (High Sensitivity): 3 ng/L (ref <18)

## 2018-10-11 LAB — CBG MONITORING, ED: Glucose-Capillary: 125 mg/dL — ABNORMAL HIGH (ref 70–99)

## 2018-10-11 LAB — APTT: aPTT: 36 seconds (ref 24–36)

## 2018-10-11 LAB — ETHANOL: Alcohol, Ethyl (B): <10 mg/dL (ref <10)

## 2018-10-11 MED ORDER — ACETAMINOPHEN 160 MG/5ML PO SOLN
650.0000 mg | ORAL | Status: DC | PRN
  Administered 2018-10-12 – 2018-10-16 (×7): 650 mg
  Filled 2018-10-11 (×7): qty 20.3

## 2018-10-11 MED ORDER — PANTOPRAZOLE SODIUM 40 MG IV SOLR
40.0000 mg | Freq: Every day | INTRAVENOUS | Status: DC
  Administered 2018-10-12 (×2): 40 mg via INTRAVENOUS
  Filled 2018-10-11 (×2): qty 40

## 2018-10-11 MED ORDER — LACTULOSE 10 GM/15ML PO SOLN
10.0000 g | Freq: Every day | ORAL | Status: DC
  Administered 2018-10-12 – 2018-10-13 (×2): 10 g
  Filled 2018-10-11 (×2): qty 15

## 2018-10-11 MED ORDER — MIDAZOLAM HCL 2 MG/2ML IJ SOLN
4.0000 mg | Freq: Once | INTRAMUSCULAR | Status: AC
  Administered 2018-10-11: 4 mg via INTRAVENOUS

## 2018-10-11 MED ORDER — SENNOSIDES-DOCUSATE SODIUM 8.6-50 MG PO TABS
1.0000 | ORAL_TABLET | Freq: Two times a day (BID) | ORAL | Status: DC
  Administered 2018-10-12: 1 via ORAL
  Filled 2018-10-11 (×2): qty 1

## 2018-10-11 MED ORDER — ACETAMINOPHEN 650 MG RE SUPP: 650.0000 mg | RECTAL | Status: DC | PRN

## 2018-10-11 MED ORDER — ACETAMINOPHEN 325 MG PO TABS: 650.0000 mg | ORAL_TABLET | ORAL | Status: DC | PRN

## 2018-10-11 MED ORDER — SODIUM CHLORIDE 0.9 % IV SOLN
INTRAVENOUS | Status: DC
  Administered 2018-10-12: 01:00:00 via INTRAVENOUS

## 2018-10-11 MED ORDER — PROPOFOL 1000 MG/100ML IV EMUL
INTRAVENOUS | Status: AC
  Filled 2018-10-11: qty 100

## 2018-10-11 MED ORDER — MIDAZOLAM HCL 5 MG/5ML IJ SOLN
INTRAMUSCULAR | Status: AC
  Administered 2018-10-11: 4 mg via INTRAVENOUS
  Filled 2018-10-11: qty 5

## 2018-10-11 MED ORDER — NALOXONE HCL 2 MG/2ML IJ SOSY
2.0000 mg | PREFILLED_SYRINGE | Freq: Once | INTRAMUSCULAR | Status: AC
  Administered 2018-10-11: 2 mg via INTRAVENOUS

## 2018-10-11 MED ORDER — SUCCINYLCHOLINE CHLORIDE 20 MG/ML IJ SOLN
INTRAMUSCULAR | Status: AC | PRN
  Administered 2018-10-11: 100 mg via INTRAVENOUS

## 2018-10-11 MED ORDER — STROKE: EARLY STAGES OF RECOVERY BOOK
Freq: Once | Status: DC
  Filled 2018-10-11: qty 1

## 2018-10-11 MED ORDER — LEVETIRACETAM IN NACL 500 MG/100ML IV SOLN
500.0000 mg | Freq: Two times a day (BID) | INTRAVENOUS | Status: DC
  Administered 2018-10-12: 500 mg via INTRAVENOUS
  Filled 2018-10-11: qty 100

## 2018-10-11 MED ORDER — NICARDIPINE HCL IN NACL 20-0.86 MG/200ML-% IV SOLN
0.0000 mg/h | INTRAVENOUS | Status: DC
  Administered 2018-10-12: 5 mg/h via INTRAVENOUS
  Filled 2018-10-11: qty 200

## 2018-10-11 MED ORDER — PROPOFOL 10 MG/ML IV BOLUS
60.0000 mg | Freq: Once | INTRAVENOUS | Status: AC
  Administered 2018-10-11: 60 mg via INTRAVENOUS
  Filled 2018-10-11: qty 20

## 2018-10-11 MED ORDER — ETOMIDATE 2 MG/ML IV SOLN
INTRAVENOUS | Status: AC | PRN
  Administered 2018-10-11: 20 mg via INTRAVENOUS

## 2018-10-11 MED ORDER — LEVETIRACETAM IN NACL 1000 MG/100ML IV SOLN
1000.0000 mg | Freq: Once | INTRAVENOUS | Status: AC
  Administered 2018-10-11: 1000 mg via INTRAVENOUS
  Filled 2018-10-11: qty 100

## 2018-10-11 MED ORDER — SODIUM CHLORIDE 0.9 % IV BOLUS
1000.0000 mL | Freq: Once | INTRAVENOUS | Status: AC
  Administered 2018-10-11: 1000 mL via INTRAVENOUS

## 2018-10-11 MED ORDER — PROPOFOL 1000 MG/100ML IV EMUL
5.0000 ug/kg/min | INTRAVENOUS | Status: DC
  Administered 2018-10-11: 10 ug/kg/min via INTRAVENOUS

## 2018-10-11 MED ORDER — INSULIN ASPART 100 UNIT/ML ~~LOC~~ SOLN
0.0000 [IU] | SUBCUTANEOUS | Status: DC
  Administered 2018-10-12 – 2018-10-13 (×3): 2 [IU] via SUBCUTANEOUS
  Administered 2018-10-13: 3 [IU] via SUBCUTANEOUS
  Administered 2018-10-13 – 2018-10-18 (×12): 2 [IU] via SUBCUTANEOUS

## 2018-10-11 NOTE — Progress Notes (Addendum)
Dropped RR to 12 to allow Pc02 to increase. Dr. Halford Chessman vent order states to maintain PC02 between 35-40.

## 2018-10-11 NOTE — Progress Notes (Signed)
Beeper 20:30 Exam start 20:32 Exam ended 20:38 Images sent to [facility] radiology called 20:39

## 2018-10-11 NOTE — ED Notes (Signed)
Date and time results received: 10/03/2018 2125  (use smartphrase ".now" to insert current time)  Test: Lactic Acid Critical Value: 3.2  Name of Provider Notified: Dr Roderic Palau  Orders Received? Or Actions Taken?:

## 2018-10-11 NOTE — ED Provider Notes (Addendum)
[facility] EMERGENCY DEPARTMENT Provider Note   CSN: [id number] Arrival date & time: 10/13/2018  2033     History   Chief Complaint Chief Complaint  Patient presents with  . Code Stroke    HPI Christian Sanders is a 58 y.o. male.     Patient has a history of cirrhosis.  He was last seen normal at 7 PM.  When his wife came home she found him unresponsive.  The history is provided by the EMS personnel. No language interpreter was used.  Altered Mental Status Presenting symptoms: no behavior changes   Severity:  Severe Most recent episode:  Today Episode history:  Continuous Timing:  Constant Progression:  Worsening Chronicity:  New Context: alcohol use     Past Medical History:  Diagnosis Date  . Abnormal CT of liver 12/20/2016  . Anxiety   . Bipolar disorder (HCC)   . Cirrhosis (HCC)   . Dentures complicating chewing    full upper, partial lower  . Depression   . Esophageal varices determined by endoscopy (HCC)   . Gastritis and duodenitis   . GERD (gastroesophageal reflux disease)   . Headache    chronic, s/p facial injury and reconstruction  . Hx of hepatitis C   . Hypertension   . Portal hypertensive gastropathy (HCC)   . PTSD (post-traumatic stress disorder)   . Vertebral column disorder    2 crushed thoracic vertebra    Patient Active Problem List   Diagnosis Date Noted  . Constipation 09/11/2018  . Chronic pain syndrome 11/05/2017  . Chronic neuropathic pain 11/05/2017  . Bilateral lower extremity edema 11/05/2017  . Alcoholic cirrhosis of liver with ascites (HCC) 10/23/2017  . Coagulopathy (HCC) 10/23/2017  . Hematochezia 10/22/2017  . Acute GI bleeding   . Lower abdominal pain   . Anemia   . Thrombocytopenia (HCC)   . History of alcohol abuse   . Hepatitis C 09/06/2017  . Mild episode of recurrent major depressive disorder (HCC) 06/13/2017  . Panic disorder 06/13/2017  . Alcohol use disorder, severe, in early remission (HCC) 06/13/2017  .  Esophageal varices in alcoholic cirrhosis (HCC)   . Depression 05/08/2017  . Varices of esophagus determined by endoscopy (HCC)   . Gastritis and duodenitis   . Splenomegaly 03/08/2017  . Hepatic cirrhosis (HCC) 12/27/2016  . RUQ pain 12/27/2016  . Portal hypertension (HCC) 12/27/2016  . Elevated liver enzymes 03/02/2016  . BPH without urinary obstruction 02/28/2016  . ED (erectile dysfunction) 12/28/2015  . SK (seborrheic keratosis) 12/28/2015  . Chronic headaches 02/09/2015  . Tobacco abuse 02/09/2015  . Hypertension 11/23/2014  . PTSD (post-traumatic stress disorder) 11/23/2014  . Special screening for malignant neoplasms, colon   . Chronic mid back pain 12/16/2013  . Closed compression fracture of thoracic vertebra (HCC) 09/05/2013    Past Surgical History:  Procedure Laterality Date  . BIOPSY  04/17/2017   Procedure: BIOPSY;  Surgeon: West BaliFields, Sandi L, MD;  [facility];  Service: Endoscopy;;  gastric for h pylori  . COLONOSCOPY WITH PROPOFOL N/A 10/09/2014   Procedure: COLONOSCOPY WITH PROPOFOL;  Surgeon: Midge Miniumarren Wohl, MD;  [facility];  Service: Endoscopy;  Laterality: N/A;  . ESOPHAGEAL BANDING N/A 04/17/2017   Procedure: ESOPHAGEAL BANDING;  Surgeon: West BaliFields, Sandi L, MD;  Location: [facility];  Service: Endoscopy;  Laterality: N/A;  . ESOPHAGEAL BANDING N/A 05/22/2017   Procedure: ESOPHAGEAL BANDING;  Surgeon: West BaliFields, Sandi L, MD;  Location: [facility];  Service:  Endoscopy;  Laterality: N/A;  . ESOPHAGEAL BANDING N/A 06/26/2017   Procedure: ESOPHAGEAL BANDING;  Surgeon: West Bali, MD;  Location: [facility];  Service: Endoscopy;  Laterality: N/A;  . ESOPHAGOGASTRODUODENOSCOPY (EGD) WITH PROPOFOL N/A 04/17/2017   Procedure: ESOPHAGOGASTRODUODENOSCOPY (EGD) WITH PROPOFOL;  Surgeon: West Bali, MD;  [facility];  Service: Endoscopy;  Laterality: N/A;  10:45am  . ESOPHAGOGASTRODUODENOSCOPY (EGD) WITH PROPOFOL N/A 05/22/2017    Procedure: ESOPHAGOGASTRODUODENOSCOPY (EGD) WITH PROPOFOL;  Surgeon: West Bali, MD;  [facility];  Service: Endoscopy;  Laterality: N/A;  . ESOPHAGOGASTRODUODENOSCOPY (EGD) WITH PROPOFOL N/A 06/26/2017   Procedure: ESOPHAGOGASTRODUODENOSCOPY (EGD) WITH PROPOFOL;  Surgeon: West Bali, MD;  [facility];  Service: Endoscopy;  Laterality: N/A;  1:45pm  . ESOPHAGOGASTRODUODENOSCOPY (EGD) WITH PROPOFOL N/A 09/11/2017   Procedure: ESOPHAGOGASTRODUODENOSCOPY (EGD) WITH PROPOFOL;  Surgeon: West Bali, MD;  [facility];  Service: Endoscopy;  Laterality: N/A;  10:30am  . FACIAL RECONSTRUCTION SURGERY     eye socket with 2 metal plates        Home Medications    Prior to Admission medications   Medication Sig Start Date End Date Taking? Authorizing Provider  ALPRAZolam Prudy Feeler) 0.5 MG tablet Take 1 tablet (0.5 mg total) by mouth 2 (two) times daily as needed for anxiety. 10/07/18   Neysa Hotter, MD  Baclofen 5 MG TABS Take 5 mg by mouth 3 (three) times daily as needed (for pain).  04/12/18   [provider]  fluticasone (FLONASE) 50 MCG/ACT nasal spray PLACE 2 SPRAYS INTO BOTH NOSTRILS DAILY AS NEEDED FOR ALLERGIES. Patient taking differently: Place 2 sprays into both nostrils daily as needed for allergies.  04/23/18   Karamalegos, Netta Neat, DO  furosemide (LASIX) 20 MG tablet TAKE 1 TABLET BY MOUTH EVERY DAY Patient taking differently: Take 20 mg by mouth daily.  12/04/17   Anice Paganini, NP  lurasidone (LATUDA) 20 MG TABS tablet Take 1 tablet (20 mg total) by mouth daily. 10/07/18   Neysa Hotter, MD  Melatonin 10 MG TABS Take 10-20 mg by mouth at bedtime as needed (for sleep).     [provider]  omeprazole (PRILOSEC) 20 MG capsule TAKE 1 CAPSULE BY MOUTH 30 MINUTES PRIOR TO FIRST MEAL Patient taking differently: Take 20 mg by mouth daily.  04/25/18   Tiffany Kocher, PA-C  pregabalin (LYRICA) 75 MG capsule Take 75 mg by mouth 2 (two)  times daily.     [provider]  sertraline (ZOLOFT) 100 MG tablet Take 1 tablet (100 mg total) by mouth daily. 10/07/18   Neysa Hotter, MD  sildenafil (REVATIO) 20 MG tablet TAKE 1 TO 5 TABLETS BY MOUTH ONCE DAILY AS NEEDED Patient taking differently: Take 20-100 mg by mouth daily as needed (for ED).  05/08/17   Smitty Cords, DO  spironolactone (ALDACTONE) 50 MG tablet TAKE 1 TABLET BY MOUTH EVERY DAY 08/28/18   Tiffany Kocher, PA-C    Family History Family History  Problem Relation Age of Onset  . Leukemia Father   . Colon cancer Neg Hx   . Liver disease Neg Hx     Social History Social History   Tobacco Use  . Smoking status: Current Every Day Smoker    Packs/day: 0.25    Years: 30.00    Pack years: 7.50    Types: Cigarettes  . Smokeless tobacco: Never Used  Substance Use Topics  . Alcohol use: No  Alcohol/week: 6.0 standard drinks    Types: 6 Cans of beer per week    Frequency: Never    Comment: None as of 03/08/17; previously 1-1.5 cases of beer a day in his 5320s.  . Drug use: No     Allergies   Bee venom, Ibuprofen, Pineapple, Pollen extract, and Tylenol [acetaminophen]   Review of Systems Review of Systems  Unable to perform ROS: Mental status change     Physical Exam Updated Vital Signs BP (!) 150/93   Pulse 95   Resp (!) 33   Ht 6\' 2"  (1.88 m)   SpO2 100%   BMI 31.25 kg/m   Physical Exam Constitutional:      Appearance: He is well-developed.  HENT:     Head: Normocephalic.     Comments: Pinpoint pupils    Nose: Nose normal.  Eyes:     General: No scleral icterus.    Conjunctiva/sclera: Conjunctivae normal.  Neck:     Musculoskeletal: Neck supple.     Thyroid: No thyromegaly.  Cardiovascular:     Rate and Rhythm: Normal rate and regular rhythm.     Heart sounds: No murmur. No friction rub. No gallop.   Pulmonary:     Breath sounds: No stridor. No wheezing or rales.  Chest:     Chest wall: No tenderness.  Abdominal:      General: There is no distension.     Tenderness: There is no abdominal tenderness. There is no rebound.  Musculoskeletal: Normal range of motion.  Lymphadenopathy:     Cervical: No cervical adenopathy.  Skin:    Findings: No erythema or rash.  Neurological:     Motor: No abnormal muscle tone.     Coordination: Coordination normal.     Comments: Patient only responds to painful stimuli by groaning and moving extremities      ED Treatments / Results  Labs (all labs ordered are listed, but only abnormal results are displayed) Labs Reviewed  PROTIME-INR - Abnormal; Notable for the following components:      Result Value   Prothrombin Time 17.4 (*)    INR 1.4 (*)    All other components within normal limits  CBC - Abnormal; Notable for the following components:   RBC 3.42 (*)    Hemoglobin 12.7 (*)    HCT 35.7 (*)    MCV 104.4 (*)    MCH 37.1 (*)    Platelets 128 (*)    All other components within normal limits  I-STAT CHEM 8, ED - Abnormal; Notable for the following components:   Glucose, Bld 133 (*)    Calcium, Ion 1.12 (*)    TCO2 17 (*)    Hemoglobin 12.6 (*)    HCT 37.0 (*)    All other components within normal limits  CBG MONITORING, ED - Abnormal; Notable for the following components:   Glucose-Capillary 125 (*)    All other components within normal limits  SARS CORONAVIRUS 2 (HOSPITAL ORDER, PERFORMED IN [facility])  APTT  DIFFERENTIAL  ETHANOL  COMPREHENSIVE METABOLIC PANEL  RAPID URINE DRUG SCREEN, HOSP PERFORMED  URINALYSIS, ROUTINE W REFLEX MICROSCOPIC  AMMONIA  LACTIC ACID, PLASMA  TROPONIN I (HIGH SENSITIVITY)    EKG None  Radiology Ct Head Code Stroke Wo Contrast  Result Date: 10/14/2018 CLINICAL DATA:  Code stroke. 58 year old male altered mental status, found unresponsive tonight. Last known normal 1 day ago. EXAM: CT HEAD WITHOUT CONTRAST TECHNIQUE: Contiguous axial images were obtained  from the base of the skull through the  vertex without intravenous contrast. COMPARISON:  Head CT 05/03/2017. FINDINGS: Brain: Large left posterior frontal/parietal lobe region intra-axial hemorrhage encompassing 54 x 57 x 63 millimeters (AP by transverse by CC) for an estimated blood volume of 97 milliliters. Regional edema and mass effect. Intraventricular extension with moderate ventriculomegaly. There is also a small volume of left convexity subarachnoid hemorrhage. No subdural hematoma identified. There is mild-to-moderate ventriculomegaly with evidence of transependymal edema at the temporal and occipital horns. There is rightward midline shift of 11 millimeters. No uncal herniation at this time. There is partial effacement of the suprasellar cistern. The remaining basilar cisterns are patent. There is blood in the 4th ventricle. Outside the area of hemorrhage gray-white matter differentiation is preserved. Vascular: Mild Calcified atherosclerosis at the skull base. Skull: No acute osseous abnormality identified. Chronic left orbital floor fracture and previous left orbital wall and maxilla reconstruction. Sinuses/Orbits: Moderate new right ethmoid and maxillary sinus mucosal thickening. Other visible paranasal sinuses and mastoids are stable. Other: No acute orbit or scalp soft tissue findings. ASPECTS [facility] Stroke Program Early CT Score) Total score (0-10 with 10 being normal): Not applicable, acute hemorrhage. IMPRESSION: Large left frontal/parietal lobe junction intra-axial hemorrhage with estimated blood volume of 97 mL. Regional edema and mass effect with 11 mm of rightward midline shift. Intraventricular extension with moderate ventriculomegaly and transependymal edema. Small volume left convexity subarachnoid hemorrhage. Critical Value/emergent results were called by telephone at the time of interpretation on 11/06/2018 at 8:54 pm to Dr. Milton Ferguson , who verbally acknowledged these results. Electronically Signed   By: Genevie Ann M.D.   On:  11/06/18 20:55    Procedures Procedure Name: Intubation Date/Time: 2018-11-06 9:17 PM Performed by: Milton Ferguson, MD Pre-anesthesia Checklist: Patient identified, Patient being monitored, Emergency Drugs available, Timeout performed and Suction available Oxygen Delivery Method: Non-rebreather mask Preoxygenation: Pre-oxygenation with 100% oxygen Induction Type: Rapid sequence Ventilation: Mask ventilation without difficulty Laryngoscope Size: Glidescope Grade View: Grade II Tube size: 7.5 mm Number of attempts: 1 Airway Equipment and Method: Patient positioned with wedge pillow and Rigid stylet Placement Confirmation: ETT inserted through vocal cords under direct vision,  CO2 detector and Breath sounds checked- equal and bilateral Tube secured with: Tape Comments: Patient was intubated with a 7 have to using a glide scope without problems       (including critical care time)  Medications Ordered in ED Medications  etomidate (AMIDATE) injection (20 mg Intravenous Given 06-Nov-2018 2101)  succinylcholine (ANECTINE) injection (100 mg Intravenous Given 11-06-2018 2102)  midazolam PF (VERSED) injection 4 mg (has no administration in time range)  propofol (DIPRIVAN) 10 mg/mL bolus/IV push 60 mg (has no administration in time range)  levETIRAcetam (KEPPRA) IVPB 1000 mg/100 mL premix (has no administration in time range)  sodium chloride 0.9 % bolus 1,000 mL (1,000 mLs Intravenous New Bag/Given 11-06-18 2105)  naloxone [facility]) injection 2 mg (2 mg Intravenous Given 2018-11-06 2035)     Initial Impression / Assessment and Plan / ED Course  I have reviewed the triage vital signs and the nursing notes.  Pertinent labs & imaging results that were available during my care of the patient were reviewed by me and considered in my medical decision making (see chart for details). .CRITICAL CARE Performed by: Milton Ferguson Total critical care time: 40 minutes Critical care time was exclusive of  separately billable procedures and treating other patients. Critical care was necessary to treat or prevent imminent or  life-threatening deterioration. Critical care was time spent personally by me on the following activities: development of treatment plan with patient and/or surrogate as well as nursing, discussions with consultants, evaluation of patient's response to treatment, examination of patient, obtaining history from patient or surrogate, ordering and performing treatments and interventions, ordering and review of laboratory studies, ordering and review of radiographic studies, pulse oximetry and re-evaluation of patient's condition.        Neuro surgery was consulted and they recommended seeing the patient [facility].  Critical care has been called and will be involved with the patient.  Final Clinical Impressions(s) / ED Diagnoses   Final diagnoses:  None    ED Discharge Orders    None       Bethann BerkshireZammit, Ulice Follett, MD 06-03-18 2121    Bethann BerkshireZammit, Teniya Filter, MD 06-03-18 2124

## 2018-10-11 NOTE — ED Notes (Signed)
Pt starting to buck the vent, breathing over vent, RT reports pt coughing when suctioned.

## 2018-10-11 NOTE — Consult Note (Signed)
[facility] [employer] Tele[facility]urology Consult Services   Date of Service:   04/16/2018 20:38:48  Impression:     .  Left Intra cranial bleed with ventricular extension  Comments/Sign-Out: Patient CAT scan is suggestive of a large left parietal bleed with intraventricular extension. I discussed the case in detail with the ED attending. He would need emergent neurosurgical care. Probably would need to be intubated for airway protection before transporting. Obviously not a TPA candidate or thrombectomy candidate at this point.  Metrics: Last Known Well: 04/16/2018 19:00:00 [employer] Notification Time: 04/16/2018 20:38:48 Arrival Time: 04/16/2018 20:33:00 Stamp Time: 04/16/2018 20:38:48 Time First Login Attempt: 04/16/2018 20:41:22 Video Start Time: 04/16/2018 20:41:22  Symptoms: Right sided weakness NIHSS Start Assessment Time: 04/16/2018 20:43:51 Patient is not a candidate for tPA. Patient was not deemed candidate for tPA thrombolytics because of Current or Previous ICH. Video End Time: 04/16/2018 20:51:02  CT head was reviewed and results were: Left IP bleed  Clinical Presentation is not Suggestive of Large Vessel Occlusive Disease  ED Physician notified of diagnostic impression and management plan on 04/16/2018 20:51:04  Our recommendations are outlined below.  Recommendations:     .  Activate Stroke Protocol Admission/Order Set     .  Stroke/Telemetry Floor     .  Neuro Checks     .  Bedside Swallow Eval     .  DVT Prophylaxis     .  IV Fluids, Normal Saline     .  Head of Bed 30 Degrees     .  Euglycemia and Avoid Hyperthermia (PRN Acetaminophen)     .  Hold Antithrombotics for Now  Routine Consultation with Inhouse Neurology for Follow up Care  Sign Out:     .  Discussed with Emergency Department Provider     .  Discussed with Rapid Response Team    ------------------------------------------------------------------------------  History of  Present Illness: Patient is a 58 year old Male.  Patient was brought by EMS for symptoms of Right sided weakness  Patient came to the hospital with right-sided weakness and aphasia, not able to talk or answer any questions. He was last seen normal around 7 PM by his wife. Was complaining of a headache. Patient is unable to provide any history.    Examination: BP(153/96), Blood Glucose(125) 1A: Level of Consciousness - Arouses to minor stimulation + 1 1B: Ask Month and Age - Could Not Answer Either Question Correctly + 2 1C: Blink Eyes & Squeeze Hands - Performs 0 Tasks + 2 2: Test Horizontal Extraocular Movements - Partial Gaze Palsy: Can Be Overcome + 1 3: Test Visual Fields - No Visual Loss + 0 4: Test Facial Palsy (Use Grimace if Obtunded) - Partial paralysis (lower face) + 2 5A: Test Left Arm Motor Drift - No Drift for 10 Seconds + 0 5B: Test Right Arm Motor Drift - No Effort Against Gravity + 3 6A: Test Left Leg Motor Drift - No Drift for 5 Seconds + 0 6B: Test Right Leg Motor Drift - No Effort Against Gravity + 3 7: Test Limb Ataxia (FNF/Heel-Shin) - No Ataxia + 0 8: Test Sensation - Complete Loss: Cannot Sense Being Touched At All + 2 9: Test Language/Aphasia - Mute/Global Aphasia: No Usable Speech/Auditory Comprehension + 3 10: Test Dysarthria - Normal + 0 11: Test Extinction/Inattention - No abnormality + 0  NIHSS Score: 19  Patient/Family was informed the Neurology Consult would happen via TeleHealth consult by way of interactive audio and video telecommunications and  consented to receiving care in this manner.  Due to the immediate potential for life-threatening deterioration due to underlying acute neurologic illness, I spent 35 minutes providing critical care. This time includes time for face to face visit via telemedicine, review of medical records, imaging studies and discussion of findings with providers, the patient and/or family.   Dr Faustino Congress   [employer] [phone]  Case [id number]

## 2018-10-11 NOTE — ED Provider Notes (Addendum)
Patient here intubated, with head bleed.  History of cirrhosis.  Transferred from anti-pad by Dr. Roderic Palau.  Neurosurgery at the bedside recommends 2 units of FFP.  Patient is not a surgical candidate.  Pulmonary ICU has been consulted and will admit.  Drug screen is positive for amphetamines and benzos.  INR mildly elevated at 1.4.  Patient sedated on propofol.  Pupils are not blown.  Patient hemodynamically stable.  Patient already given Keppra.  ICU to admit.  This chart was dictated using voice recognition software.  Despite best efforts to proofread,  errors can occur which can change the documentation meaning.     Lennice Sites, DO 10/30/2018 [place], [place], DO 10/12/18 0104

## 2018-10-11 NOTE — ED Notes (Signed)
This RN updated daughter, son in law, and significant other regarding patients condition and transfer to [facility]. Family on the way to [facility] at this time. Per significant other patient has not felt well for a few weeks and passed out in the bathroom about a week ago and refused EMS transport once he came back around. Per son in law patient may have been using some meth and drinking alcohol. Significant other could not confirm nor deny drug or alcohol use. She did state that patient went to the store with a neighbor shortly before having to call EMS.

## 2018-10-11 NOTE — ED Notes (Signed)
Pt was taken off propofol on his arrival here around 2230 when the pa for neuro asked that it be stopped

## 2018-10-11 NOTE — ED Triage Notes (Signed)
Pt went directly to CT after eval by dr Roderic Palau.  Code Stroke called.

## 2018-10-11 NOTE — ED Notes (Signed)
Unable to complete SSS. Pt intubated

## 2018-10-11 NOTE — ED Notes (Signed)
Pt brought in by [facility] from [facility] they arrived approximately 2225 he was lsn 1800 ventilator from there  [facility] was originally 35

## 2018-10-11 NOTE — ED Notes (Signed)
Family at the bedside  With dr Rita Ohara talking about the care for this pt

## 2018-10-11 NOTE — Consult Note (Addendum)
Chief Complaint   Chief Complaint  Patient presents with  . Code Stroke    HPI   Consult requested by: Dr Estell Harpin Reason for consult: ICH  HPI: Christian Sanders is a 58 y.o. male with history alcoholic cirrhosis, Hep C, HTN, portal hypertension who presented to [facility] ED as code stroke after wife found him unresponsive. LKN 1900. He underwent stat head CT which revealed large left parietal hemorrhage with intraventricular extension. By report he had right sided flaccidity, was spontaneously moving LUE/LLE, not following commands. He was intubated by ED and transferred emergently to [facility] for NS evaluation.   With known cirrhosis, he is coagulopathic at baseline. Labs today reviewed. INR 1.4, platelets 128k. Not on blood thinning agents.  Patient Active Problem List   Diagnosis Date Noted  . Constipation 09/11/2018  . Chronic pain syndrome 11/05/2017  . Chronic neuropathic pain 11/05/2017  . Bilateral lower extremity edema 11/05/2017  . Alcoholic cirrhosis of liver with ascites (HCC) 10/23/2017  . Coagulopathy (HCC) 10/23/2017  . Hematochezia 10/22/2017  . Acute GI bleeding   . Lower abdominal pain   . Anemia   . Thrombocytopenia (HCC)   . History of alcohol abuse   . Hepatitis C 09/06/2017  . Mild episode of recurrent major depressive disorder (HCC) 06/13/2017  . Panic disorder 06/13/2017  . Alcohol use disorder, severe, in early remission (HCC) 06/13/2017  . Esophageal varices in alcoholic cirrhosis (HCC)   . Depression 05/08/2017  . Varices of esophagus determined by endoscopy (HCC)   . Gastritis and duodenitis   . Splenomegaly 03/08/2017  . Hepatic cirrhosis (HCC) 12/27/2016  . RUQ pain 12/27/2016  . Portal hypertension (HCC) 12/27/2016  . Elevated liver enzymes 03/02/2016  . BPH without urinary obstruction 02/28/2016  . ED (erectile dysfunction) 12/28/2015  . SK (seborrheic keratosis) 12/28/2015  . Chronic headaches 02/09/2015  . Tobacco abuse 02/09/2015  . Hypertension  11/23/2014  . PTSD (post-traumatic stress disorder) 11/23/2014  . Special screening for malignant neoplasms, colon   . Chronic mid back pain 12/16/2013  . Closed compression fracture of thoracic vertebra (HCC) 09/05/2013    PMH: Past Medical History:  Diagnosis Date  . Abnormal CT of liver 12/20/2016  . Anxiety   . Bipolar disorder (HCC)   . Cirrhosis (HCC)   . Dentures complicating chewing    full upper, partial lower  . Depression   . Esophageal varices determined by endoscopy (HCC)   . Gastritis and duodenitis   . GERD (gastroesophageal reflux disease)   . Headache    chronic, s/p facial injury and reconstruction  . Hx of hepatitis C   . Hypertension   . Portal hypertensive gastropathy (HCC)   . PTSD (post-traumatic stress disorder)   . Vertebral column disorder    2 crushed thoracic vertebra    PSH: Past Surgical History:  Procedure Laterality Date  . BIOPSY  04/17/2017   Procedure: BIOPSY;  Surgeon: West Bali, MD;  Location: [facility];  Service: Endoscopy;;  gastric for h pylori  . COLONOSCOPY WITH PROPOFOL N/A 10/09/2014   Procedure: COLONOSCOPY WITH PROPOFOL;  Surgeon: Midge Minium, MD;  Location: [facility];  Service: Endoscopy;  Laterality: N/A;  . ESOPHAGEAL BANDING N/A 04/17/2017   Procedure: ESOPHAGEAL BANDING;  Surgeon: West Bali, MD;  [facility]: [facility];  Service: Endoscopy;  Laterality: N/A;  . ESOPHAGEAL BANDING N/A 05/22/2017   Procedure: ESOPHAGEAL BANDING;  Surgeon: West Bali, MD;  Location: [facility];  Service:  Endoscopy;  Laterality: N/A;  . ESOPHAGEAL BANDING N/A 06/26/2017   Procedure: ESOPHAGEAL BANDING;  Surgeon: West Bali, MD;  [facility]: [facility];  Service: Endoscopy;  Laterality: N/A;  . ESOPHAGOGASTRODUODENOSCOPY (EGD) WITH PROPOFOL N/A 04/17/2017   Procedure: ESOPHAGOGASTRODUODENOSCOPY (EGD) WITH PROPOFOL;  Surgeon: West Bali, MD;  [facility]: [facility];  Service: Endoscopy;  Laterality: N/A;   10:45am  . ESOPHAGOGASTRODUODENOSCOPY (EGD) WITH PROPOFOL N/A 05/22/2017   Procedure: ESOPHAGOGASTRODUODENOSCOPY (EGD) WITH PROPOFOL;  Surgeon: West Bali, MD;  [facility];  Service: Endoscopy;  Laterality: N/A;  . ESOPHAGOGASTRODUODENOSCOPY (EGD) WITH PROPOFOL N/A 06/26/2017   Procedure: ESOPHAGOGASTRODUODENOSCOPY (EGD) WITH PROPOFOL;  Surgeon: West Bali, MD;  [facility];  Service: Endoscopy;  Laterality: N/A;  1:45pm  . ESOPHAGOGASTRODUODENOSCOPY (EGD) WITH PROPOFOL N/A 09/11/2017   Procedure: ESOPHAGOGASTRODUODENOSCOPY (EGD) WITH PROPOFOL;  Surgeon: West Bali, MD;  [facility]: [facility];  Service: Endoscopy;  Laterality: N/A;  10:30am  . FACIAL RECONSTRUCTION SURGERY     eye socket with 2 metal plates    (Not in a hospital admission)   SH: Social History   Tobacco Use  . Smoking status: Current Every Day Smoker    Packs/day: 0.25    Years: 30.00    Pack years: 7.50    Types: Cigarettes  . Smokeless tobacco: Never Used  Substance Use Topics  . Alcohol use: No    Alcohol/week: 6.0 standard drinks    Types: 6 Cans of beer per week    Frequency: Never    Comment: None as of 03/08/17; previously 1-1.5 cases of beer a day in his 29s.  . Drug use: No    MEDS: Prior to Admission medications   Medication Sig Start Date End Date Taking? Authorizing Provider  furosemide (LASIX) 20 MG tablet TAKE 1 TABLET BY MOUTH EVERY DAY Patient taking differently: Take 20 mg by mouth daily.  12/04/17  Yes Anice Paganini, NP  lurasidone (LATUDA) 20 MG TABS tablet Take 1 tablet (20 mg total) by mouth daily. 10/07/18  Yes Hisada, Barbee Cough, MD  omeprazole (PRILOSEC) 20 MG capsule TAKE 1 CAPSULE BY MOUTH 30 MINUTES PRIOR TO FIRST MEAL Patient taking differently: Take 20 mg by mouth daily.  04/25/18  Yes Tiffany Kocher, PA-C  pregabalin (LYRICA) 75 MG capsule Take 75 mg by mouth 2 (two) times daily.    Yes [provider]  spironolactone (ALDACTONE) 50 MG tablet  TAKE 1 TABLET BY MOUTH EVERY DAY 08/28/18  Yes Tiffany Kocher, PA-C  ALPRAZolam Prudy Feeler) 0.5 MG tablet Take 1 tablet (0.5 mg total) by mouth 2 (two) times daily as needed for anxiety. 10/07/18   Neysa Hotter, MD  Baclofen 5 MG TABS Take 5 mg by mouth 3 (three) times daily as needed (for pain).  04/12/18   [provider]  fluticasone (FLONASE) 50 MCG/ACT nasal spray PLACE 2 SPRAYS INTO BOTH NOSTRILS DAILY AS NEEDED FOR ALLERGIES. Patient taking differently: Place 2 sprays into both nostrils daily as needed for allergies.  04/23/18   Karamalegos, Netta Neat, DO  Melatonin 10 MG TABS Take 10-20 mg by mouth at bedtime as needed (for sleep).     [provider]  sertraline (ZOLOFT) 100 MG tablet Take 1 tablet (100 mg total) by mouth daily. 10/07/18   Neysa Hotter, MD  sildenafil (REVATIO) 20 MG tablet TAKE 1 TO 5 TABLETS BY MOUTH ONCE DAILY AS NEEDED Patient taking differently: Take 20-100 mg by mouth daily as  needed (for ED).  05/08/17   Smitty Cords, DO    ALLERGY: Allergies  Allergen Reactions  . Bee Venom Swelling and Other (See Comments)    Throat and Tongue   . Ibuprofen Other (See Comments)    Due to liver  . Pineapple Nausea And Vomiting  . Pollen Extract Other (See Comments)    Runny nose, itchy, watery eyes, congestion  . Tylenol [Acetaminophen] Other (See Comments)    Due to liver    Social History   Tobacco Use  . Smoking status: Current Every Day Smoker    Packs/day: 0.25    Years: 30.00    Pack years: 7.50    Types: Cigarettes  . Smokeless tobacco: Never Used  Substance Use Topics  . Alcohol use: No    Alcohol/week: 6.0 standard drinks    Types: 6 Cans of beer per week    Frequency: Never    Comment: None as of 03/08/17; previously 1-1.5 cases of beer a day in his 66s.     Family History  Problem Relation Age of Onset  . Leukemia Father   . Colon cancer Neg Hx   . Liver disease Neg Hx      ROS   ROS unable to obtain, intubated   Exam   Vitals:   11/09/2018 2115 November 09, 2018 2120  BP: (!) 157/97   Pulse: 85 88  Resp: (!) 35 20  Temp:  (!) 94.3 F (34.6 C)  SpO2: 100% 100%   Intubated Sedation lifted for ten minutes for examination anisocoria 3mm right, 2mm left, nonreactive Minimal corneal, +gag/cough Decorticate posturing left sided No movement RUE/RLE  Results - Imaging/Labs   Results for orders placed or performed during the hospital encounter of 09-Nov-2018 (from the past 48 hour(s))  CBG monitoring, ED     Status: Abnormal   Collection Time: 2018-11-09  8:45 PM  Result Value Ref Range   Glucose-Capillary 125 (H) 70 - 99 mg/dL  Ethanol     Status: None   Collection Time: November 09, 2018  8:48 PM  Result Value Ref Range   Alcohol, Ethyl (B) <10 <10 mg/dL    Comment: (NOTE) Lowest detectable limit for serum alcohol is 10 mg/dL. For medical purposes only. Performed at [facility], [address], [place], [place] [address]   Protime-INR     Status: Abnormal   Collection Time: 11/09/2018  8:48 PM  Result Value Ref Range   Prothrombin Time 17.4 (H) 11.4 - 15.2 seconds   INR 1.4 (H) 0.8 - 1.2    Comment: (NOTE) INR goal varies based on device and disease states. Performed at [facility], [address], [place], [place] [address]   APTT     Status: None   Collection Time: 2018/11/09  8:48 PM  Result Value Ref Range   aPTT 36 24 - 36 seconds    Comment: Performed at [facility], [address], [place], [place] [address]  CBC     Status: Abnormal   Collection Time: November 09, 2018  8:48 PM  Result Value Ref Range   WBC 9.0 4.0 - 10.5 K/uL   RBC 3.42 (L) 4.22 - 5.81 MIL/uL   Hemoglobin 12.7 (L) 13.0 - 17.0 g/dL   HCT 91.4 (L) 78.2 - 95.6 %   MCV 104.4 (H) 80.0 - 100.0 fL   MCH 37.1 (H) 26.0 - 34.0 pg   MCHC 35.6 30.0 - 36.0 g/dL   RDW 21.3 08.6 - 57.8 %   Platelets 128 (L)  150 - 400 K/uL   nRBC 0.0 0.0 - 0.2 %    Comment: Performed at [facility], [address], [place], [place] [address]  Differential      Status: None   Collection Time: 10/30/2018  8:48 PM  Result Value Ref Range   Neutrophils Relative % 64 %   Neutro Abs 5.8 1.7 - 7.7 K/uL   Lymphocytes Relative 22 %   Lymphs Abs 2.0 0.7 - 4.0 K/uL   Monocytes Relative 8 %   Monocytes Absolute 0.7 0.1 - 1.0 K/uL   Eosinophils Relative 5 %   Eosinophils Absolute 0.4 0.0 - 0.5 K/uL   Basophils Relative 1 %   Basophils Absolute 0.1 0.0 - 0.1 K/uL   Immature Granulocytes 0 %   Abs Immature Granulocytes 0.03 0.00 - 0.07 K/uL    Comment: Performed at [facility], [address] [place], [place] [address]  I-stat chem 8, ED     Status: Abnormal   Collection Time: 10/06/2018  9:05 PM  Result Value Ref Range   Sodium 138 135 - 145 mmol/L   Potassium 3.6 3.5 - 5.1 mmol/L   Chloride 109 98 - 111 mmol/L   BUN 9 6 - 20 mg/dL   Creatinine, Ser 0.98 0.61 - 1.24 mg/dL   Glucose, Bld 119 (H) 70 - 99 mg/dL   Calcium, Ion 1.47 (L) 1.15 - 1.40 mmol/L   TCO2 17 (L) 22 - 32 mmol/L   Hemoglobin 12.6 (L) 13.0 - 17.0 g/dL   HCT 82.9 (L) 56.2 - 13.0 %    Ct Head Code Stroke Wo Contrast  Result Date: 10/08/2018 CLINICAL DATA:  Code stroke. 58 year old male altered mental status, found unresponsive tonight. Last known normal 1 day ago. EXAM: CT HEAD WITHOUT CONTRAST TECHNIQUE: Contiguous axial images were obtained from the base of the skull through the vertex without intravenous contrast. COMPARISON:  Head CT 05/03/2017. FINDINGS: Brain: Large left posterior frontal/parietal lobe region intra-axial hemorrhage encompassing 54 x 57 x 63 millimeters (AP by transverse by CC) for an estimated blood volume of 97 milliliters. Regional edema and mass effect. Intraventricular extension with moderate ventriculomegaly. There is also a small volume of left convexity subarachnoid hemorrhage. No subdural hematoma identified. There is mild-to-moderate ventriculomegaly with evidence of transependymal edema at the temporal and occipital horns. There is rightward midline shift of  11 millimeters. No uncal herniation at this time. There is partial effacement of the suprasellar cistern. The remaining basilar cisterns are patent. There is blood in the 4th ventricle. Outside the area of hemorrhage gray-white matter differentiation is preserved. Vascular: Mild Calcified atherosclerosis at the skull base. Skull: No acute osseous abnormality identified. Chronic left orbital floor fracture and previous left orbital wall and maxilla reconstruction. Sinuses/Orbits: Moderate new right ethmoid and maxillary sinus mucosal thickening. Other visible paranasal sinuses and mastoids are stable. Other: No acute orbit or scalp soft tissue findings. ASPECTS [facility] Stroke Program Early CT Score) Total score (0-10 with 10 being normal): Not applicable, acute hemorrhage. IMPRESSION: Large left frontal/parietal lobe junction intra-axial hemorrhage with estimated blood volume of 97 mL. Regional edema and mass effect with 11 mm of rightward midline shift. Intraventricular extension with moderate ventriculomegaly and transependymal edema. Small volume left convexity subarachnoid hemorrhage. Critical Value/emergent results were called by telephone at the time of interpretation on 10/27/2018 at 8:54 pm to Dr. Bethann Berkshire , who verbally acknowledged these results. Electronically Signed   By: Odessa Fleming M.D.   On: 10/08/2018 20:55  Impression/Plan   58 y.o. male with large left parietal hemorrhage with intraventricular extension. He has a mild coagulopathy secondary to cirrhosis with underlying prior alcoholism & hepatitis C. INR 1.4, platelets 128k. His neuro exam is poor.   Dr Newell Coral had a lengthy discussion with the patient's wife, daughter and son in law. Specifically, they discussed patient's current situation and prognosis. The role of possible clot evacuation was also discussed although this is likely to not improve his condition, and given his underlying coagulopathy, he will likely require multiple  surgeries due to reaccumulation.  Our rec was to support him medically with aggressive medical management. FFP/Platelets to be admin.  Cindra Presume, PA-C [facility]

## 2018-10-11 NOTE — H&P (Signed)
NAME:  Christian Sanders, MRN:  [id number], DOB:  15-May-1960, LOS: 0 ADMISSION DATE:  10/29/18, CONSULTATION DATE:  2018-10-29 REFERRING MD:  Forestine Na [facility], CHIEF COMPLAINT:  ICH  Brief History   58 yo M found to have large L parietal hemorrage with intraventricular extension. Intubated at [facility] and being transferred to [facility]  History of present illness   History obtained from chart review  58 yo M PMH EtOH abuse, alcoholic cirrhosis, GERD, Hep C, HTN, portal hyper who presented to [facility] ED via EMS as Code Stroke after being found "unresponsive" by wife. Per OSH, patient exhibiting R sided weakness, aphasia, and recent complaint of headache. Last seen normal by wife at 61 on 2018/10/29. CT Head non con reveals L parietal hemorrhage with ventricular extension. NIHSS score determined to be 19. Patient intubated at [facility] ED. Patient transferred to [facility] for [place] evaluation.   NSGY has been consulted and has requested PCCM admit.  UDS positive for amphetamines and BZD.  INR 1.4 Plt 128  2 FFP has been ordered.   Past Medical History  EtOH abuse Alcoholic Cirrhosis Hep C GERD GI Bleed Gastritis  BPD Panic Disorder PTSD Anxiety Depression Coagulopathy HTN Portal HTN BPH Chronic Pain   Significant Hospital Events   7/10 transferred to [facility] for admission   Consults:  NSGY Neuro PCCM  Procedures:  7/10 ETT>>>   Significant Diagnostic Tests:  7/10 CT Head non-con> Large L posterior frontal/parietal lobe intra-axial hemorrhage measuring 54x57x30mm, with associated edema and mass effect. Intraventricular extension with ventriculomegaly. Small L SAH. No SDH. Some Ventriculomegaly with edema at temporal and occipital horns. 47mm rightward midline shift. No uncal herniation. Partial effacement of suprasellar cistern. Blood present in 4th ventricle.  CXR 7/10> no acute cardiopulmonary abnormality. ETT at thoracic inlet and 7cm advancement is advised   Micro Data:  7/10 SARS CoV2 >  negative  Antimicrobials:    Interim history/subjective:  Transferred to [employer], neuro status remains poor off sedation. Remains intubated   Objective   Blood pressure (!) 157/97, pulse 88, temperature (!) 94.1 F (34.5 C), temperature source Core, resp. rate 20, height 6\' 2"  (1.88 m), SpO2 100 %.    Vent Mode: PRVC FiO2 (%):  [100 %] 100 % Set Rate:  [18 bmp] 18 bmp Vt Set:  [650 mL] 650 mL PEEP:  [5 cmH20] 5 cmH20 Plateau Pressure:  [15 cmH20] 15 cmH20  No intake or output data in the 24 hours ending 10/29/2018 2127 There were no vitals filed for this visit.  Examination: General: WDWN chronically ill appearing adult M, intubated, supine in bed  NAD HENT: NCAT. ETT secure. Pink mmm. Trachea midline. Anicteric sclera Lungs: CTA. Symmetrical chest expansion. Breathing over vent, no accessory muscle recruitment Cardiovascular: RRR s1s2 no rgm. Capillary refill < 3 seconds BUE BLE Abdomen: Soft, round, + bowel sounds x4 No organomegaly  Extremities: Symmetrical bulk and tone. No obvious joint deformity. No cyanosis, no clubbing  Neuro: R pupil 54mm R pupil 16mm and are non-reactive bilatearlly. Does not follow commands. Gag/cough reflexes intact.  GU: Due to void  Skin: Spider Angiomas across neck, chest. Skin is pale, clean, warm, dry.   Resolved Hospital Problem list    Assessment & Plan:   Large L parietal ICH with intraventricular extension -mortality calculated to be 97%  -Received Keppra load  P NSGY has recommended medical management FFP ordered  SBP goal 120-140-- will order cardene gtt for goal  Neuro checks  Keppra BID  Seizure precautions   Acute Respiratory Failure requiring intubation - In setting of above P Continue mechanical ventilation Propofol for sedation Pulm hygiene  AM CXR  Alcoholic Cirrhosis Mild coagulopathy  Hx Hep C Hyperammonemia  P 2FFP ordered as above per NSGY rec  Trend LFTs  Lactulose   Hyperglycemia P SSI   HTN P  Cardene gtt as above for goal 120-140  Best practice:  Diet: NPO Pain/Anxiety/Delirium protocol (if indicated): Propofol  VAP protocol (if indicated): yes  DVT prophylaxis: SCD GI prophylaxis: protonix  Glucose control: SSI  Mobility: BR  Code Status: FULL  Family Communication: Per Dr. Jule SerNudleman  Disposition: admit to ICU   Labs   CBC: Recent Labs  Lab 10/16/2018 2048 10/02/2018 2105  WBC 9.0  --   NEUTROABS 5.8  --   HGB 12.7* 12.6*  HCT 35.7* 37.0*  MCV 104.4*  --   PLT 128*  --     Basic Metabolic Panel: Recent Labs  Lab 10/13/2018 2105  NA 138  K 3.6  CL 109  GLUCOSE 133*  BUN 9  CREATININE 0.70   GFR: CrCl cannot be calculated (Unknown ideal weight.). Recent Labs  Lab 10/28/2018 2048 10/10/2018 2056  WBC 9.0  --   LATICACIDVEN  --  3.2*    Liver Function Tests: No results for input(s): AST, ALT, ALKPHOS, BILITOT, PROT, ALBUMIN in the last 168 hours. No results for input(s): LIPASE, AMYLASE in the last 168 hours. Recent Labs  Lab 10/10/2018 2048  AMMONIA 55*    ABG    Component Value Date/Time   TCO2 17 (L) 10/23/2018 2105     Coagulation Profile: Recent Labs  Lab 10/06/2018 2048  INR 1.4*    Cardiac Enzymes: No results for input(s): CKTOTAL, CKMB, CKMBINDEX, TROPONINI in the last 168 hours.  HbA1C: No results found for: HGBA1C  CBG: Recent Labs  Lab 10/17/2018 2045  GLUCAP 125*    Review of Systems:   Unable to obtain, patient intubated, encephalopathic in setting of ICH   Past Medical History  He,  has a past medical history of Abnormal CT of liver (12/20/2016), Anxiety, Bipolar disorder (HCC), Cirrhosis (HCC), Dentures complicating chewing, Depression, Esophageal varices determined by endoscopy (HCC), Gastritis and duodenitis, GERD (gastroesophageal reflux disease), Headache, hepatitis C, Hypertension, Portal hypertensive gastropathy (HCC), PTSD (post-traumatic stress disorder), and Vertebral column disorder.   Surgical History     Past Surgical History:  Procedure Laterality Date  . BIOPSY  04/17/2017   Procedure: BIOPSY;  Surgeon: West BaliFields, Sandi L, MD;  Location: [facility];  Service: Endoscopy;;  gastric for h pylori  . COLONOSCOPY WITH PROPOFOL N/A 10/09/2014   Procedure: COLONOSCOPY WITH PROPOFOL;  Surgeon: Midge Miniumarren Wohl, MD;  [facility];  Service: Endoscopy;  Laterality: N/A;  . ESOPHAGEAL BANDING N/A 04/17/2017   Procedure: ESOPHAGEAL BANDING;  Surgeon: West BaliFields, Sandi L, MD;  Location: [facility];  Service: Endoscopy;  Laterality: N/A;  . ESOPHAGEAL BANDING N/A 05/22/2017   Procedure: ESOPHAGEAL BANDING;  Surgeon: West BaliFields, Sandi L, MD;  Location: [facility];  Service: Endoscopy;  Laterality: N/A;  . ESOPHAGEAL BANDING N/A 06/26/2017   Procedure: ESOPHAGEAL BANDING;  Surgeon: West BaliFields, Sandi L, MD;  Location: [facility];  Service: Endoscopy;  Laterality: N/A;  . ESOPHAGOGASTRODUODENOSCOPY (EGD) WITH PROPOFOL N/A 04/17/2017   Procedure: ESOPHAGOGASTRODUODENOSCOPY (EGD) WITH PROPOFOL;  Surgeon: West BaliFields, Sandi L, MD;  Location: [facility];  Service: Endoscopy;  Laterality: N/A;  10:45am  . ESOPHAGOGASTRODUODENOSCOPY (EGD) WITH PROPOFOL  N/A 05/22/2017   Procedure: ESOPHAGOGASTRODUODENOSCOPY (EGD) WITH PROPOFOL;  Surgeon: West BaliFields, Sandi L, MD;  [facility];  Service: Endoscopy;  Laterality: N/A;  . ESOPHAGOGASTRODUODENOSCOPY (EGD) WITH PROPOFOL N/A 06/26/2017   Procedure: ESOPHAGOGASTRODUODENOSCOPY (EGD) WITH PROPOFOL;  Surgeon: West BaliFields, Sandi L, MD;  [facility];  Service: Endoscopy;  Laterality: N/A;  1:45pm  . ESOPHAGOGASTRODUODENOSCOPY (EGD) WITH PROPOFOL N/A 09/11/2017   Procedure: ESOPHAGOGASTRODUODENOSCOPY (EGD) WITH PROPOFOL;  Surgeon: West BaliFields, Sandi L, MD;  Location: [facility];  Service: Endoscopy;  Laterality: N/A;  10:30am  . FACIAL RECONSTRUCTION SURGERY     eye socket with 2 metal plates     Social History   reports that he has been smoking cigarettes. He has a  7.50 pack-year smoking history. He has never used smokeless tobacco. He reports that he does not drink alcohol or use drugs.   Family History   His family history includes Leukemia in his father. There is no history of Colon cancer or Liver disease.   Allergies Allergies  Allergen Reactions  . Bee Venom Swelling and Other (See Comments)    Throat and Tongue   . Ibuprofen Other (See Comments)    Due to liver  . Pineapple Nausea And Vomiting  . Pollen Extract Other (See Comments)    Runny nose, itchy, watery eyes, congestion  . Tylenol [Acetaminophen] Other (See Comments)    Due to liver     Home Medications  Prior to Admission medications   Medication Sig Start Date End Date Taking? Authorizing Provider  furosemide (LASIX) 20 MG tablet TAKE 1 TABLET BY MOUTH EVERY DAY Patient taking differently: Take 20 mg by mouth daily.  12/04/17  Yes Anice PaganiniGill, Eric A, NP  lurasidone (LATUDA) 20 MG TABS tablet Take 1 tablet (20 mg total) by mouth daily. 10/07/18  Yes Hisada, Barbee Cougheina, MD  omeprazole (PRILOSEC) 20 MG capsule TAKE 1 CAPSULE BY MOUTH 30 MINUTES PRIOR TO FIRST MEAL Patient taking differently: Take 20 mg by mouth daily.  04/25/18  Yes Tiffany KocherLewis, Leslie S, PA-C  pregabalin (LYRICA) 75 MG capsule Take 75 mg by mouth 2 (two) times daily.    Yes [provider]  spironolactone (ALDACTONE) 50 MG tablet TAKE 1 TABLET BY MOUTH EVERY DAY 08/28/18  Yes Tiffany KocherLewis, Leslie S, PA-C  ALPRAZolam Prudy Feeler(XANAX) 0.5 MG tablet Take 1 tablet (0.5 mg total) by mouth 2 (two) times daily as needed for anxiety. 10/07/18   Neysa HotterHisada, Reina, MD  Baclofen 5 MG TABS Take 5 mg by mouth 3 (three) times daily as needed (for pain).  04/12/18   [provider]  fluticasone (FLONASE) 50 MCG/ACT nasal spray PLACE 2 SPRAYS INTO BOTH NOSTRILS DAILY AS NEEDED FOR ALLERGIES. Patient taking differently: Place 2 sprays into both nostrils daily as needed for allergies.  04/23/18   Karamalegos, Netta NeatAlexander J, DO  Melatonin 10 MG TABS Take 10-20 mg  by mouth at bedtime as needed (for sleep).     [provider]  sertraline (ZOLOFT) 100 MG tablet Take 1 tablet (100 mg total) by mouth daily. 10/07/18   Neysa HotterHisada, Reina, MD  sildenafil (REVATIO) 20 MG tablet TAKE 1 TO 5 TABLETS BY MOUTH ONCE DAILY AS NEEDED Patient taking differently: Take 20-100 mg by mouth daily as needed (for ED).  05/08/17   Smitty CordsKaramalegos, Alexander J, DO     Critical care time: 3955 minutes      Tessie FassGrace Infantof Villagomez MSN, AGACNP-BC [facility] [phone] If no answer, [phone] 2019-02-25, 11:59 PM

## 2018-10-11 NOTE — ED Notes (Signed)
Tele neuro spoke with DR Roderic Palau- recommends intubation and neuro surgery. Hassan Rowan, RN, Lonn Georgia, RN Jerene Pitch, RN at bedside. Angie,NT at bedside.

## 2018-10-11 NOTE — ED Notes (Signed)
Pt being transported out at this time.

## 2018-10-12 ENCOUNTER — Encounter (HOSPITAL_COMMUNITY): Payer: Self-pay

## 2018-10-12 ENCOUNTER — Other Ambulatory Visit: Payer: Self-pay

## 2018-10-12 ENCOUNTER — Inpatient Hospital Stay (HOSPITAL_COMMUNITY): Payer: Medicare Other

## 2018-10-12 DIAGNOSIS — I611 Nontraumatic intracerebral hemorrhage in hemisphere, cortical: Principal | ICD-10-CM

## 2018-10-12 DIAGNOSIS — I629 Nontraumatic intracranial hemorrhage, unspecified: Secondary | ICD-10-CM

## 2018-10-12 LAB — CBC WITH DIFFERENTIAL/PLATELET
Abs Immature Granulocytes: 0.02 10*3/uL (ref 0.00–0.07)
Basophils Absolute: 0 10*3/uL (ref 0.0–0.1)
Basophils Relative: 0 %
Eosinophils Absolute: 0.1 10*3/uL (ref 0.0–0.5)
Eosinophils Relative: 1 %
HCT: 37.6 % — ABNORMAL LOW (ref 39.0–52.0)
Hemoglobin: 12.9 g/dL — ABNORMAL LOW (ref 13.0–17.0)
Immature Granulocytes: 0 %
Lymphocytes Relative: 10 %
Lymphs Abs: 1 10*3/uL (ref 0.7–4.0)
MCH: 37.8 pg — ABNORMAL HIGH (ref 26.0–34.0)
MCHC: 34.3 g/dL (ref 30.0–36.0)
MCV: 110.3 fL — ABNORMAL HIGH (ref 80.0–100.0)
Monocytes Absolute: 0.5 10*3/uL (ref 0.1–1.0)
Monocytes Relative: 5 %
Neutro Abs: 8.1 10*3/uL — ABNORMAL HIGH (ref 1.7–7.7)
Neutrophils Relative %: 84 %
Platelets: 114 10*3/uL — ABNORMAL LOW (ref 150–400)
RBC: 3.41 MIL/uL — ABNORMAL LOW (ref 4.22–5.81)
RDW: 13.7 % (ref 11.5–15.5)
WBC: 9.7 10*3/uL (ref 4.0–10.5)
nRBC: 0 % (ref 0.0–0.2)

## 2018-10-12 LAB — GLUCOSE, CAPILLARY
Glucose-Capillary: 129 mg/dL — ABNORMAL HIGH (ref 70–99)
Glucose-Capillary: 123 mg/dL — ABNORMAL HIGH (ref 70–99)
Glucose-Capillary: 113 mg/dL — ABNORMAL HIGH (ref 70–99)
Glucose-Capillary: 111 mg/dL — ABNORMAL HIGH (ref 70–99)
Glucose-Capillary: 113 mg/dL — ABNORMAL HIGH (ref 70–99)
Glucose-Capillary: 113 mg/dL — ABNORMAL HIGH (ref 70–99)
Glucose-Capillary: 107 mg/dL — ABNORMAL HIGH (ref 70–99)

## 2018-10-12 LAB — SODIUM
Sodium: 137 mmol/L (ref 135–145)
Sodium: 139 mmol/L (ref 135–145)
Sodium: 142 mmol/L (ref 135–145)
Sodium: 144 mmol/L (ref 135–145)

## 2018-10-12 LAB — HEMOGLOBIN A1C
Hgb A1c MFr Bld: 5 % (ref 4.8–5.6)
Mean Plasma Glucose: 96.8 mg/dL

## 2018-10-12 LAB — MRSA PCR SCREENING: MRSA by PCR: NEGATIVE

## 2018-10-12 LAB — ABO/RH: ABO/RH(D): B POS

## 2018-10-12 LAB — PHOSPHORUS
Phosphorus: 2.9 mg/dL (ref 2.5–4.6)
Phosphorus: 2.4 mg/dL — ABNORMAL LOW (ref 2.5–4.6)

## 2018-10-12 LAB — TYPE AND SCREEN
ABO/RH(D): B POS
Antibody Screen: NEGATIVE

## 2018-10-12 LAB — MAGNESIUM
Magnesium: 1.9 mg/dL (ref 1.7–2.4)
Magnesium: 1.9 mg/dL (ref 1.7–2.4)

## 2018-10-12 MED ORDER — LIDOCAINE HCL (PF) 1 % IJ SOLN
INTRAMUSCULAR | Status: AC
  Administered 2018-10-12: 04:00:00
  Filled 2018-10-12: qty 15

## 2018-10-12 MED ORDER — SENNOSIDES 8.8 MG/5ML PO SYRP
5.0000 mL | ORAL_SOLUTION | Freq: Two times a day (BID) | ORAL | Status: DC
  Administered 2018-10-12 – 2018-10-18 (×10): 5 mL
  Filled 2018-10-12 (×12): qty 5

## 2018-10-12 MED ORDER — FENTANYL CITRATE (PF) 100 MCG/2ML IJ SOLN
50.0000 ug | INTRAMUSCULAR | Status: DC | PRN
  Administered 2018-10-12 – 2018-10-17 (×13): 50 ug via INTRAVENOUS
  Filled 2018-10-12 (×13): qty 2

## 2018-10-12 MED ORDER — ORAL CARE MOUTH RINSE
15.0000 mL | OROMUCOSAL | Status: DC
  Administered 2018-10-12 – 2018-10-21 (×83): 15 mL via OROMUCOSAL

## 2018-10-12 MED ORDER — SODIUM CHLORIDE 3 % IV SOLN
INTRAVENOUS | Status: DC
  Filled 2018-10-12: qty 500

## 2018-10-12 MED ORDER — CHLORHEXIDINE GLUCONATE 0.12% ORAL RINSE (MEDLINE KIT)
15.0000 mL | Freq: Two times a day (BID) | OROMUCOSAL | Status: DC
  Administered 2018-10-12 – 2018-10-20 (×18): 15 mL via OROMUCOSAL

## 2018-10-12 MED ORDER — PRO-STAT SUGAR FREE PO LIQD
30.0000 mL | Freq: Two times a day (BID) | ORAL | Status: DC
  Administered 2018-10-12: 30 mL
  Filled 2018-10-12: qty 30

## 2018-10-12 MED ORDER — LEVETIRACETAM 100 MG/ML PO SOLN
500.0000 mg | Freq: Two times a day (BID) | ORAL | Status: DC
  Administered 2018-10-12 – 2018-10-19 (×14): 500 mg
  Filled 2018-10-12 (×14): qty 5

## 2018-10-12 MED ORDER — VITAL HIGH PROTEIN PO LIQD
1000.0000 mL | ORAL | Status: DC
  Administered 2018-10-12 – 2018-10-18 (×7): 1000 mL

## 2018-10-12 MED ORDER — DOCUSATE SODIUM 50 MG/5ML PO LIQD
50.0000 mg | Freq: Two times a day (BID) | ORAL | Status: DC
  Administered 2018-10-12 – 2018-10-18 (×10): 50 mg
  Filled 2018-10-12 (×12): qty 10

## 2018-10-12 MED ORDER — MORPHINE SULFATE (PF) 4 MG/ML IV SOLN
INTRAVENOUS | Status: AC
  Administered 2018-10-12: 03:00:00
  Filled 2018-10-12: qty 1

## 2018-10-12 MED ORDER — PRO-STAT SUGAR FREE PO LIQD
60.0000 mL | Freq: Three times a day (TID) | ORAL | Status: DC
  Administered 2018-10-12 – 2018-10-18 (×18): 60 mL
  Filled 2018-10-12 (×17): qty 60

## 2018-10-12 MED ORDER — SODIUM CHLORIDE 3 % IV SOLN
INTRAVENOUS | Status: DC
  Administered 2018-10-12 (×3): 75 mL/h via INTRAVENOUS
  Filled 2018-10-12 (×5): qty 500

## 2018-10-12 MED ORDER — CHLORHEXIDINE GLUCONATE CLOTH 2 % EX PADS
6.0000 | MEDICATED_PAD | Freq: Every day | CUTANEOUS | Status: DC
  Administered 2018-10-12 – 2018-10-18 (×5): 6 via TOPICAL

## 2018-10-12 NOTE — Progress Notes (Signed)
Pt transported from 2M07 to 4N28 with no problems.

## 2018-10-12 NOTE — Progress Notes (Signed)
Pt transported from ED TRAC to 2M07 on ventilator with no complications.

## 2018-10-12 NOTE — Progress Notes (Signed)
Subjective: Patient admitted to MICU.  Dr. Laurence SlateAroor obtained a follow-up CT of the brain which showed mildly increased ventricular size, consistent with hydrocephalus.  Objective: Vital signs in last 24 hours: Vitals:   10/12/18 0245 10/12/18 0314 10/12/18 0315 10/12/18 0338  BP: (!) 152/88 139/73 139/73   Pulse: 99 (!) 109 (!) 107 (!) 105  Resp: 15 16 16 16   Temp: 99.1 F (37.3 C) 99 F (37.2 C) 99 F (37.2 C) 99 F (37.2 C)  TempSrc: Bladder Bladder Bladder Bladder  SpO2: 100% 100% 100% 100%  Weight:      Height:        Intake/Output from previous day: 07/10 0701 - 07/11 0700 In: 810 [Blood:810] Out: 875 [Urine:625; Emesis/NG output:250] Intake/Output this shift: Total I/O In: 810 [Blood:810] Out: 875 [Urine:625; Emesis/NG output:250]  Physical Exam: Patient not requiring sedation.  Not opening eyes to voice or stimulation.  Left pupil 1 mm, right pupil 2 mm, both round, left pupil not reactive, right pupil questionably slightly reactive.  Continued decorticate movement on left and no movement on right.  CBC Recent Labs    10/12/2018 2048  10/22/2018 2319 10/04/2018 2327  WBC 9.0  --  9.7  --   HGB 12.7*   < > 12.9* 12.2*  HCT 35.7*   < > 37.6* 36.0*  PLT 128*  --  114*  --    < > = values in this interval not displayed.   BMET Recent Labs    10/04/2018 2048 10/09/2018 2105 10/08/2018 2327  NA 137 138 139  K 3.7 3.6 4.0  CL 107 109  --   CO2 19*  --   --   GLUCOSE 130* 133*  --   BUN 11 9  --   CREATININE 0.81 0.70  --   CALCIUM 9.1  --   --    ABG    Component Value Date/Time   PHART 7.409 10/14/2018 2327   PCO2ART 31.4 (L) 10/13/2018 2327   PO2ART 291.0 (H) 10/28/2018 2327   HCO3 19.9 (L) 10/17/2018 2327   TCO2 21 (L) 10/18/2018 2327   ACIDBASEDEF 4.0 (H) 10/13/2018 2327   O2SAT 100.0 10/07/2018 2327    Studies/Results: Ct Head Wo Contrast  Result Date: 10/12/2018 CLINICAL DATA:  Follow-up examination for acute intracranial hemorrhage. EXAM: CT HEAD  WITHOUT CONTRAST TECHNIQUE: Contiguous axial images were obtained from the base of the skull through the vertex without intravenous contrast. COMPARISON:  Prior CT from 10/22/2018. FINDINGS: Brain: Large intraparenchymal hematoma centered at the left frontoparietal region, overall similar in size and morphology as compared to previous exam. Surrounding low-density vasogenic edema with regional mass effect. Associated intraventricular extension with intraventricular hemorrhage within the lateral, third, and fourth ventricles. Degree of intraventricular hemorrhage is increased from previous. Temporal horn dilatation compatible with associated hydrocephalus, slightly worsened from previous. Associated left-to-right shift of up to approximately 12 mm, relatively similar. Basilar cistern crowding without transtentorial herniation, similar to previous. No other new acute intracranial hemorrhage. No acute large vessel territory infarct. No extra-axial fluid collection. Vascular: No appreciable hyperdense vessel. Subarachnoid hemorrhage extends along the course of the left MCA, similar to previous. Skull: Scalp soft tissues and calvarium demonstrate no acute finding. Sinuses/Orbits: Globes and orbital soft tissues demonstrate no acute finding. Sequelae of prior ORIF seen at the left face. Scattered mucosal thickening with air-fluid levels within the right maxillary and sphenoid sinuses. Endotracheal and enteric tubes partially visualized. Mastoid air cells remain clear. Other: None. IMPRESSION: 1. No  significant interval change in size and morphology of large intraparenchymal hematoma centered at the left frontoparietal region. Associated regional mass effect with 12 mm of left-to-right midline shift, relatively similar. 2. Intraventricular extension with large volume intraventricular hemorrhage, increased from previous exam. Slightly worsened associated hydrocephalus. 3. No other new acute intracranial abnormality.  Electronically Signed   By: Jeannine Boga M.D.   On: 10/12/2018 03:09   Dg Chest Portable 1 View  Result Date: 10/25/2018 CLINICAL DATA:  Evaluate ETT placement EXAM: PORTABLE CHEST 1 VIEW COMPARISON:  October 29, 2016 FINDINGS: The ETT terminates just above the thoracic inlet at 11 cm above the carina. Recommend advancing 7 cm. No pneumothorax. The cardiomediastinal silhouette is unremarkable given technique. No focal infiltrates. IMPRESSION: 1. The ETT terminates just above the thoracic inlet. Recommend advancing 7 cm. Findings called to Dr. Roderic Palau. Electronically Signed   By: Dorise Bullion III M.D   On: 10/14/2018 21:21   Ct Head Code Stroke Wo Contrast  Result Date: 10/10/2018 CLINICAL DATA:  Code stroke. 58 year old male altered mental status, found unresponsive tonight. Last known normal 1 day ago. EXAM: CT HEAD WITHOUT CONTRAST TECHNIQUE: Contiguous axial images were obtained from the base of the skull through the vertex without intravenous contrast. COMPARISON:  Head CT 05/03/2017. FINDINGS: Brain: Large left posterior frontal/parietal lobe region intra-axial hemorrhage encompassing 54 x 57 x 63 millimeters (AP by transverse by CC) for an estimated blood volume of 97 milliliters. Regional edema and mass effect. Intraventricular extension with moderate ventriculomegaly. There is also a small volume of left convexity subarachnoid hemorrhage. No subdural hematoma identified. There is mild-to-moderate ventriculomegaly with evidence of transependymal edema at the temporal and occipital horns. There is rightward midline shift of 11 millimeters. No uncal herniation at this time. There is partial effacement of the suprasellar cistern. The remaining basilar cisterns are patent. There is blood in the 4th ventricle. Outside the area of hemorrhage gray-white matter differentiation is preserved. Vascular: Mild Calcified atherosclerosis at the skull base. Skull: No acute osseous abnormality identified.  Chronic left orbital floor fracture and previous left orbital wall and maxilla reconstruction. Sinuses/Orbits: Moderate new right ethmoid and maxillary sinus mucosal thickening. Other visible paranasal sinuses and mastoids are stable. Other: No acute orbit or scalp soft tissue findings. ASPECTS [facility] Stroke Program Early CT Score) Total score (0-10 with 10 being normal): Not applicable, acute hemorrhage. IMPRESSION: Large left frontal/parietal lobe junction intra-axial hemorrhage with estimated blood volume of 97 mL. Regional edema and mass effect with 11 mm of rightward midline shift. Intraventricular extension with moderate ventriculomegaly and transependymal edema. Small volume left convexity subarachnoid hemorrhage. Critical Value/emergent results were called by telephone at the time of interpretation on 10/03/2018 at 8:54 pm to Dr. Milton Ferguson , who verbally acknowledged these results. Electronically Signed   By: Genevie Ann M.D.   On: 10/02/2018 20:55    Assessment/Plan: Spoke with patient's daughter Janett Billow about placing a intraventricular catheter to decompress the hydrocephalus.  We discussed that it will not improve his prognosis for neurologic recovery, but may increase the likelihood of survival.  We discussed risks of the procedure including infection, bleeding along the catheter tract or within the ventricle, and increased neurologic deficit.  After discussing with the patient's daughter, she does want [place] to proceed with placement of the IVC.  Hosie Spangle, MD 10/12/2018, 4:14 AM

## 2018-10-12 NOTE — Progress Notes (Signed)
Patient transferred via stretcher to 2M07 from ED. Patient does not follow commands. Only posturing when stimulated. Right pupil is irregular, and a 3 with no visible reaction. Left pupil is a 2 with no reaction. Patient is intubated, 7.5 ETT, 26 at the lip. OGT hooked to LWS with pink tinged, thick secretions obtained, 200cc's. Two PIV, one in RT [facility] and LT hand. Both flush with good blood return, saline locked. Propofol is not infusing. No belongings with patient. Family updated in ED and going home. We have patients daughters phone number if consent is needed. Blood sugar checked and is 129. SCD's placed on patient. Foley cath patent and draining orange, cloudy urine. Patient is hooked up to cardiac monitor. Will continue to monitor patient.

## 2018-10-12 NOTE — Consult Note (Signed)
Requesting Physician: Dr. Jule SerNudleman    Chief Complaint: "Angelica RanUnreponsive"  History obtained from: Chart review  HPI:                                                                                                                                       Lawerance CruelKarl Schiavi is a 58 y.o. male with past medical history significant for alcohol abuse, liver cirrhosis, hep C, hypertension including portal hypertension presented to [facility] emergency department after being found unresponsive by his wife.  Last seen normal was 7 PM.  On arrival to [facility], he was evaluated by tele neurology and stat CT head was performed which showed a large left parietal hemorrhage with midline shift and intraventricular extension.  NIH stroke scale was 19.  Patient was intubated for airway protection.  [facility] was informed and patient was transferred for neurosurgical evaluation.  His UDS was positive for amphetamines and benzodiazepines INR was 1.4 was 128. 2 units of FFP ordered. Neurosurgery evaluated the patient and felt he was not a candidate for hematoma evacuation. Neurology was consulted for further recommendations.   Date last known well: 7.10-20, LSN was 7pm tPA Given: No, hemorrhage ICH score: 4   Past Medical History:  Diagnosis Date  . Abnormal CT of liver 12/20/2016  . Anxiety   . Bipolar disorder (HCC)   . Cirrhosis (HCC)   . Dentures complicating chewing    full upper, partial lower  . Depression   . Esophageal varices determined by endoscopy (HCC)   . Gastritis and duodenitis   . GERD (gastroesophageal reflux disease)   . Headache    chronic, s/p facial injury and reconstruction  . Hx of hepatitis C   . Hypertension   . Portal hypertensive gastropathy (HCC)   . PTSD (post-traumatic stress disorder)   . Vertebral column disorder    2 crushed thoracic vertebra    Past Surgical History:  Procedure Laterality Date  . BIOPSY  04/17/2017   Procedure: BIOPSY;  Surgeon: West BaliFields, Sandi L,  MD;  [facility];  Service: Endoscopy;;  gastric for h pylori  . COLONOSCOPY WITH PROPOFOL N/A 10/09/2014   Procedure: COLONOSCOPY WITH PROPOFOL;  Surgeon: Midge Miniumarren Wohl, MD;  [facility];  Service: Endoscopy;  Laterality: N/A;  . ESOPHAGEAL BANDING N/A 04/17/2017   Procedure: ESOPHAGEAL BANDING;  Surgeon: West BaliFields, Sandi L, MD;  [facility];  Service: Endoscopy;  Laterality: N/A;  . ESOPHAGEAL BANDING N/A 05/22/2017   Procedure: ESOPHAGEAL BANDING;  Surgeon: West BaliFields, Sandi L, MD;  [facility];  Service: Endoscopy;  Laterality: N/A;  . ESOPHAGEAL BANDING N/A 06/26/2017   Procedure: ESOPHAGEAL BANDING;  Surgeon: West BaliFields, Sandi L, MD;  [facility];  Service: Endoscopy;  Laterality: N/A;  . ESOPHAGOGASTRODUODENOSCOPY (EGD) WITH PROPOFOL N/A 04/17/2017   Procedure: ESOPHAGOGASTRODUODENOSCOPY (EGD) WITH PROPOFOL;  Surgeon: West BaliFields, Sandi L, MD;  [facility]  [facility];  Service: Endoscopy;  Laterality: N/A;  10:45am  . ESOPHAGOGASTRODUODENOSCOPY (EGD) WITH PROPOFOL N/A 05/22/2017   Procedure: ESOPHAGOGASTRODUODENOSCOPY (EGD) WITH PROPOFOL;  Surgeon: Danie Binder, MD;  [facility];  Service: Endoscopy;  Laterality: N/A;  . ESOPHAGOGASTRODUODENOSCOPY (EGD) WITH PROPOFOL N/A 06/26/2017   Procedure: ESOPHAGOGASTRODUODENOSCOPY (EGD) WITH PROPOFOL;  Surgeon: Danie Binder, MD;  [facility];  Service: Endoscopy;  Laterality: N/A;  1:45pm  . ESOPHAGOGASTRODUODENOSCOPY (EGD) WITH PROPOFOL N/A 09/11/2017   Procedure: ESOPHAGOGASTRODUODENOSCOPY (EGD) WITH PROPOFOL;  Surgeon: Danie Binder, MD;  [facility];  Service: Endoscopy;  Laterality: N/A;  10:30am  . FACIAL RECONSTRUCTION SURGERY     eye socket with 2 metal plates    Family History  Problem Relation Age of Onset  . Leukemia Father   . Colon cancer Neg Hx   . Liver disease Neg Hx    Social History:  reports that he has been smoking cigarettes. He has a 7.50  pack-year smoking history. He has never used smokeless tobacco. He reports that he does not drink alcohol or use drugs.  Allergies:  Allergies  Allergen Reactions  . Bee Venom Swelling and Other (See Comments)    Throat and Tongue   . Ibuprofen Other (See Comments)    Due to liver  . Pineapple Nausea And Vomiting  . Pollen Extract Other (See Comments)    Runny nose, itchy, watery eyes, congestion  . Tylenol [Acetaminophen] Other (See Comments)    Due to liver    Medications:                                                                                                                        I reviewed home medications   ROS:                                                                                                                                     Unable to obtain as patient is comatose   Examination:  General: Appears well-developed Psych: Affect appropriate to situation Eyes: No scleral injection HENT: No OP obstrucion Head: Normocephalic.  Cardiovascular: Normal rate and regular rhythm. Respiratory: With assistance of ventilator, triggering the vent GI: Soft.  No distension. There is no tenderness.  Skin: WDI    Neurological Examination Mental Status: Patient does not respond to verbal stimuli.  Does not respond to deep sternal rub.  Does not follow commands.  Cranial Nerves: II: Pupils are round, 4 mm in the right very sluggish 3 mm in the left nonreactive III,IV,VI: doll's response sluggish.  V,VII: corneal reflex: Absent VIII: patient does not respond to verbal stimuli IX,X: gag reflex present XI: trapezius strength unable to test bilaterally XII: tongue strength unable to test Motor: Withdraws in bilateral upper extremities, triple flexion bilateral lower extreme knees Sensory: Does not respond to noxious stimuli in any extremity. Deep Tendon Reflexes:   Absent throughout. Plantars: Upgoing bilaterally Cerebellar: Unable to perform     Lab Results: Basic Metabolic Panel: Recent Labs  Lab 10/25/2018 2048 10/09/2018 2105 10/05/2018 2327  NA 137 138 139  K 3.7 3.6 4.0  CL 107 109  --   CO2 19*  --   --   GLUCOSE 130* 133*  --   BUN 11 9  --   CREATININE 0.81 0.70  --   CALCIUM 9.1  --   --     CBC: Recent Labs  Lab 10/28/2018 2048 10/06/2018 2105 10/17/2018 2319 10/31/2018 2327  WBC 9.0  --  9.7  --   NEUTROABS 5.8  --  8.1*  --   HGB 12.7* 12.6* 12.9* 12.2*  HCT 35.7* 37.0* 37.6* 36.0*  MCV 104.4*  --  110.3*  --   PLT 128*  --  114*  --     Coagulation Studies: Recent Labs    10/20/2018 2048  LABPROT 17.4*  INR 1.4*    Imaging: Dg Chest Portable 1 View  Result Date: 10/16/2018 CLINICAL DATA:  Evaluate ETT placement EXAM: PORTABLE CHEST 1 VIEW COMPARISON:  October 29, 2016 FINDINGS: The ETT terminates just above the thoracic inlet at 11 cm above the carina. Recommend advancing 7 cm. No pneumothorax. The cardiomediastinal silhouette is unremarkable given technique. No focal infiltrates. IMPRESSION: 1. The ETT terminates just above the thoracic inlet. Recommend advancing 7 cm. Findings called to Dr. Estell HarpinZammit. Electronically Signed   By: Gerome Samavid  Williams III M.D   On: 10/10/2018 21:21   Ct Head Code Stroke Wo Contrast  Result Date: 10/07/2018 CLINICAL DATA:  Code stroke. 58 year old male altered mental status, found unresponsive tonight. Last known normal 1 day ago. EXAM: CT HEAD WITHOUT CONTRAST TECHNIQUE: Contiguous axial images were obtained from the base of the skull through the vertex without intravenous contrast. COMPARISON:  Head CT 05/03/2017. FINDINGS: Brain: Large left posterior frontal/parietal lobe region intra-axial hemorrhage encompassing 54 x 57 x 63 millimeters (AP by transverse by CC) for an estimated blood volume of 97 milliliters. Regional edema and mass effect. Intraventricular extension with moderate  ventriculomegaly. There is also a small volume of left convexity subarachnoid hemorrhage. No subdural hematoma identified. There is mild-to-moderate ventriculomegaly with evidence of transependymal edema at the temporal and occipital horns. There is rightward midline shift of 11 millimeters. No uncal herniation at this time. There is partial effacement of the suprasellar cistern. The remaining basilar cisterns are patent. There is blood in the 4th ventricle. Outside the area of hemorrhage gray-white matter differentiation is preserved. Vascular: Mild Calcified atherosclerosis at the skull base.  Skull: No acute osseous abnormality identified. Chronic left orbital floor fracture and previous left orbital wall and maxilla reconstruction. Sinuses/Orbits: Moderate new right ethmoid and maxillary sinus mucosal thickening. Other visible paranasal sinuses and mastoids are stable. Other: No acute orbit or scalp soft tissue findings. ASPECTS [facility]([place] Stroke Program Early CT Score) Total score (0-10 with 10 being normal): Not applicable, acute hemorrhage. IMPRESSION: Large left frontal/parietal lobe junction intra-axial hemorrhage with estimated blood volume of 97 mL. Regional edema and mass effect with 11 mm of rightward midline shift. Intraventricular extension with moderate ventriculomegaly and transependymal edema. Small volume left convexity subarachnoid hemorrhage. Critical Value/emergent results were called by telephone at the time of interpretation on Jan 01, 2019 at 8:54 pm to Dr. Bethann BerkshireJOSEPH ZAMMIT , who verbally acknowledged these results. Electronically Signed   By: Odessa FlemingH  Hall M.D.   On: 0Sep 30, 2020 20:55     ASSESSMENT AND PLAN   58 y.o. male with past medical history significant for alcohol abuse, liver cirrhosis, hep C, hypertension including portal hypertension presented to [facility] emergency department after being found unresponsive by his wife. Unfortunately had a large left IPH with intraventricular extension  with significant midline shift.  Patient was evaluated by tele-neurology at [facility] and transferred for emergent neurosurgical evaluation.  Evaluated by neurosurgery, Dr. Newell CoralNudelman had a long discussion with family and according to him family does not want aggressive neurosurgical intervention such as hematoma evacuation and craniotomy.  Neurology was consulted for medical recommendations in the management of this patient.  #Large intraparenchymal hemorrhage in the left parietal lobe  with intraventricular extension and fourth ventricular involvement  #Cytotoxic and vasogenic cerebral edema with 12 mm left-to-right midline shift and worsening hydrocephalus #Coagulopathy secondary to cirrhosis  Recommendations -Repeat CT head to assess for hydrocephalus/herniation, neurosurgery on board -We will start hypertonic saline for cerebral edema -Agree with 2 units of FFP -Cardene drip with blood pressure goal less than 140 systolic -Frequent neurochecks -No antiplatelets/anticoagulants  Addendum CT head repeated at 2 AM, showing worsening hydrocephalus however stable midline shift.  Will start patient on hypertonic saline and spoke with neurosurgery PA to review CT head.  Patient will now undergo EVD placement for worsening hydrocephalus.  FFP's were being administered and patient has been started on Cardene drip.   This patient is neurologically critically ill due to large ICH with midline shift and intraventricular hemorrhage resulting hydrocephalus.  He is at risk for significant risk of neurological worsening from cerebral edema,  death from brain herniation, heart failure, hemorrhagic conversion, infection, respiratory failure and seizure. This patient's care requires constant monitoring of vital signs, hemodynamics, respiratory and cardiac monitoring, review of multiple databases, neurological assessment, discussion with family, other specialists and medical decision making of high complexity.  I  spent 60  minutes of neurocritical time in the care of this patient.      Paola Aleshire [facility] Pager Number [phone]

## 2018-10-12 NOTE — Progress Notes (Signed)
Patient transferring to 4N. Report called.

## 2018-10-12 NOTE — Progress Notes (Signed)
Hypertonic saline switched from R AC to L upper arm PIV at 1850

## 2018-10-12 NOTE — ED Notes (Signed)
Pt arrived at [facility]. During transport propofol was titrated up to 25 mcg.  Versed was given as ordered to aid in sedation. VSS. Report given to bedside RN Gerald Stabs, EDP present in room.

## 2018-10-12 NOTE — Progress Notes (Signed)
Initial Nutrition Assessment  DOCUMENTATION CODES:  Obesity unspecified  INTERVENTION:  Initiate TF via OGT/NGT with Vital HP at goal rate of 40 ml/h (960 ml per day) and Prostat 60 ml TID to provide 1560 kcals, 174 gm protein, 803 ml free water daily.  NUTRITION DIAGNOSIS:  Inadequate oral intake related to inability to eat as evidenced by NPO status.  GOAL:  Provide needs based on ASPEN/SCCM guidelines  MONITOR:  I & O's, Skin, TF tolerance, Weight trends, Labs, Diet advancement, Vent status  REASON FOR ASSESSMENT:  Consult Enteral/tube feeding initiation and management  ASSESSMENT:  58 y/o male PMHx Alcoholic cirrhosis, Hep C, HTN, Portal hypertension. [facility] after being found "unresponsive" by wife.  Noted to have R side weakness, aphasia. CT head showed L ICH w/ ventricular extension. Intubated and transferred to [facility] for neurosurgery evaluation.   RD operating remotely d/t covid precautions.   Pt is s/p IVC placement earlier today.   There is no explicit mention of pts recent oral intake in chart, however there is notation that pt continues to drink alcohol and has been using meth. Suspect poor nutritional baseline.   Weight wise, pt is 243 lbs. Per chart, pts weight appears to have been relatively stale, largely staying between 235 and 245 for the last several years.   Patient is currently intubated on ventilator support MV: 14.1 L/min Temp (24hrs), Avg:98.8 F (37.1 C), Min:94.1 F (34.5 C), Max:100.6 F (38.1 C) Propofol:  BP:112/62  Labs: Bg: 110-125, A1C was 5.  Meds: Colace, SSI, Lactulose, PPI, Senokot, Hypertonic IVF  Recent Labs  Lab 03-Sep-2018 2048 03-Sep-2018 2105 03-Sep-2018 2327 10/12/18 0425 10/12/18 0938  NA 137 138 139 137 139  K 3.7 3.6 4.0  --   --   CL 107 109  --   --   --   CO2 19*  --   --   --   --   BUN 11 9  --   --   --   CREATININE 0.81 0.70  --   --   --   CALCIUM 9.1  --   --   --   --   MG  --   --   --   --  1.9  PHOS   --   --   --   --  2.9  GLUCOSE 130* 133*  --   --   --    NUTRITION - FOCUSED PHYSICAL EXAM: Unable to conduct  Diet Order:   Diet Order            Diet NPO time specified  Diet effective now             EDUCATION NEEDS:  No education needs have been identified at this time  Skin:  Surgical Incision to R skull- s/p IVC  Last BM:  Unknown  Height:  Ht Readings from Last 1 Encounters:  03-Sep-2018 6\' 2"  (1.88 m)   Weight:  Wt Readings from Last 1 Encounters:  10/12/18 110.2 kg   Wt Readings from Last 10 Encounters:  10/12/18 110.2 kg  09/11/18 110.4 kg  05/13/18 106.6 kg  04/17/18 108.4 kg  03/12/18 108.9 kg  02/07/18 110.7 kg  12/25/17 111.6 kg  12/23/17 110.7 kg  12/11/17 111.6 kg  11/18/17 106.6 kg   Ideal Body Weight:  86.36 kg  BMI:  Body mass index is 31.19 kg/m.  Estimated Nutritional Needs:  Kcal:  1210-1540 kcals Protein:  172g/pro (2g/kg ibw) Fluid:  Defer to MD  Burtis Junes RD, LDN, CNSC Clinical Nutrition Available Tues-Sat via Pager: [phone] 10/12/2018 12:03 PM

## 2018-10-12 NOTE — ED Notes (Signed)
Daughter kyl givler [phone]

## 2018-10-12 NOTE — ED Notes (Signed)
Dr Rita Ohara has talked to the family again

## 2018-10-12 NOTE — Progress Notes (Signed)
RT transported pt on vent from 2M07 to CT and back without any complications. Pt respiratory status stable at this time and during transport. RT will continue to monitor.

## 2018-10-12 NOTE — Progress Notes (Addendum)
NAME:  Christian Sanders, MRN:  [id number], DOB:  1960-09-11, LOS: 1 ADMISSION DATE:  Oct 22, 2018, CONSULTATION DATE:  10-22-18 REFERRING MD:  Forestine Na ED, CHIEF COMPLAINT:  ICH  Brief History   58 yo M found to have large L parietal hemorrage with intraventricular extension. Intubated at [facility] and being transferred to [facility]  History of present illness   History obtained from chart review  58 yo M PMH EtOH abuse, alcoholic cirrhosis, GERD, Hep C, HTN, portal hyper who presented to [facility] ED via EMS as Code Stroke after being found "unresponsive" by wife. Per OSH, patient exhibiting R sided weakness, aphasia, and recent complaint of headache. Last seen normal by wife at 11 on 10/22/18. CT Head non con reveals L parietal hemorrhage with ventricular extension. NIHSS score determined to be 19. Patient intubated at [facility] ED. Patient transferred to [facility] for [place] evaluation.   NSGY has been consulted and has requested PCCM admit.  UDS positive for amphetamines and BZD.  INR 1.4 Plt 128  2 FFP has been ordered.   Past Medical History  EtOH abuse Alcoholic Cirrhosis Hep C GERD GI Bleed Gastritis  BPD Panic Disorder PTSD Anxiety Depression Coagulopathy HTN Portal HTN BPH Chronic Pain   Significant Hospital Events   7/10 transferred to [facility] for admission   Consults:  NSGY Neuro PCCM  Procedures:  7/10 ETT>>>  7/11 R lateral ventricle EVD.  Significant Diagnostic Tests:  7/10 CT Head non-con> Large L posterior frontal/parietal lobe intra-axial hemorrhage measuring 54x57x43mm, with associated edema and mass effect. Intraventricular extension with ventriculomegaly. Small L SAH. No SDH. Some Ventriculomegaly with edema at temporal and occipital horns. 18mm rightward midline shift. No uncal herniation. Partial effacement of suprasellar cistern. Blood present in 4th ventricle.  CXR 7/10> no acute cardiopulmonary abnormality. ETT at thoracic inlet and 7cm advancement is advised  7/11  CT head prior to EVD redemonstrated  L parietal bleed but now with extensive intraventricular extension and ventricular casting (personally reviewed)  Micro Data:  7/10 SARS CoV2 > negative  Antimicrobials:  none  Interim history/subjective:  EVD placed early this morning by Dr Sherwood Gambler. Limited improvement in neurological status following drainage.  Objective   Blood pressure 112/69, pulse (!) 114, temperature 100 F (37.8 C), resp. rate 19, height 6\' 2"  (1.88 m), weight 110.2 kg, SpO2 99 %.    Vent Mode: PSV;CPAP FiO2 (%):  [40 %-100 %] 40 % Set Rate:  [12 bmp-18 bmp] 12 bmp Vt Set:  [650 mL-670 mL] 670 mL PEEP:  [5 cmH20] 5 cmH20 Pressure Support:  [5 cmH20] 5 cmH20 Plateau Pressure:  [15 cmH20] 15 cmH20   Intake/Output Summary (Last 24 hours) at 10/12/2018 1058 Last data filed at 10/12/2018 1000 Gross per 24 hour  Intake 1616.68 ml  Output 1133 ml  Net 483.68 ml   Filed Weights   22-Oct-2018 2137 10/12/18 0100  Weight: 110.4 kg 110.2 kg    Examination: General: well nourished man appears stated age.  HENT: . ETT secure. Pink mmm. Trachea midline. Anicteric sclera. EVD in place.  Lungs: CTA. Symmetrical chest expansion. Breathing over vent, no accessory muscle recruitment Cardiovascular: RRR s1s2 no rgm. Capillary refill < 3 seconds BUE BLE Abdomen: Soft, round, + bowel sounds x4 No organomegaly  Extremities: Symmetrical bulk and tone. No obvious joint deformity. No cyanosis, no clubbing, no edema  Neuro: R pupil 45mm R pupil 62mm and are reactive bilaterally, corneal absent on the right . Does not follow commands. Gag/cough reflexes  intact. Withdraws to pain in upper extremities, extensor response in LE. EVD at 10cm with drainage of bright bloody CSF. GU: Foley catheter in place. Skin: Spider Angiomas across neck, chest. Skin is pale, clean, warm, dry.   Resolved Hospital Problem list    Assessment & Plan:   Critically ill due to large L parietal ICH with  intraventricular extension requiring titration of anti-hypertensive infusions to prevent hemorrhage extension and administration of hyperosmolar therapies to prevent ICP rise. ICH score 4 predicts poor 30 day prognosis, although age suggests better prognosis.  - May be suitable candidate for intraventricular TPA. -Continue to titrate cardene as necessary to keep SBP 110-140 -Continue 3% saline to target Sodium 150-155. -Keppra for seizure prophylaxis.  Critically ill due to cute Respiratory Failure requiring mechanical ventilation Tolerating PSV but mental status precludes extubation.  Alcoholic Cirrhosis Mild coagulopathy  Hx Hep C Hyperammonemia  Trend LFTs  Lactulose/rifaximin to avoid concurrent hepatic encephalopathy.  Hyperglycemia P SSI   HTN P Cardene gtt as above for goal 120-140  Best practice:  Diet: NPO - start tube feeds  Pain/Anxiety/Delirium protocol (if indicated): prn fentanyl only  VAP protocol (if indicated): yes  DVT prophylaxis: SCD GI prophylaxis: protonix  Glucose control: SSI  Mobility: BR  Code Status: FULL  Family Communication: Per Dr. Jule SerNudleman  Disposition: admit to ICU   Labs   CBC: Recent Labs  Lab 10/02/2018 2048 10/03/2018 2105 10/13/2018 2319 10/15/2018 2327  WBC 9.0  --  9.7  --   NEUTROABS 5.8  --  8.1*  --   HGB 12.7* 12.6* 12.9* 12.2*  HCT 35.7* 37.0* 37.6* 36.0*  MCV 104.4*  --  110.3*  --   PLT 128*  --  114*  --     Basic Metabolic Panel: Recent Labs  Lab 10/09/2018 2048 10/31/2018 2105 10/13/2018 2327 10/12/18 0425 10/12/18 0938  NA 137 138 139 137 139  K 3.7 3.6 4.0  --   --   CL 107 109  --   --   --   CO2 19*  --   --   --   --   GLUCOSE 130* 133*  --   --   --   BUN 11 9  --   --   --   CREATININE 0.81 0.70  --   --   --   CALCIUM 9.1  --   --   --   --    GFR: Estimated Creatinine Clearance: 134.6 mL/min (by C-G formula based on SCr of 0.7 mg/dL). Recent Labs  Lab 10/07/2018 2048 10/10/2018 2056 10/18/2018 2319   WBC 9.0  --  9.7  LATICACIDVEN  --  3.2*  --     Liver Function Tests: Recent Labs  Lab 10/10/2018 2048  AST 49*  ALT 32  ALKPHOS 180*  BILITOT 1.8*  PROT 7.8  ALBUMIN 3.4*   No results for input(s): LIPASE, AMYLASE in the last 168 hours. Recent Labs  Lab 10/10/2018 2048  AMMONIA 55*    ABG    Component Value Date/Time   PHART 7.409 10/06/2018 2327   PCO2ART 31.4 (L) 10/09/2018 2327   PO2ART 291.0 (H) 11/01/2018 2327   HCO3 19.9 (L) 10/28/2018 2327   TCO2 21 (L) 10/24/2018 2327   ACIDBASEDEF 4.0 (H) 10/04/2018 2327   O2SAT 100.0 10/18/2018 2327     Coagulation Profile: Recent Labs  Lab 10/04/2018 2048  INR 1.4*    Cardiac Enzymes: No results for input(s): CKTOTAL, CKMB, CKMBINDEX,  TROPONINI in the last 168 hours.  HbA1C: Hgb A1c MFr Bld  Date/Time Value Ref Range Status  10/25/2018 11:19 PM 5.0 4.8 - 5.6 % Final    Comment:    (NOTE) Pre diabetes:          5.7%-6.4% Diabetes:              >6.4% Glycemic control for   <7.0% adults with diabetes     CBG: Recent Labs  Lab 10/16/2018 2045 10/12/18 0104 10/12/18 0520 10/12/18 0759  GLUCAP 125* 129* 123* 113*   CRITICAL CARE Performed by: Lynnell Catalanavi Akiba Melfi   Total critical care time: 40 minutes  Critical care time was exclusive of separately billable procedures and treating other patients.  Critical care was necessary to treat or prevent imminent or life-threatening deterioration.  Critical care was time spent personally by me on the following activities: development of treatment plan with patient and/or surrogate as well as nursing, discussions with consultants, evaluation of patient's response to treatment, examination of patient, obtaining history from patient or surrogate, ordering and performing treatments and interventions, ordering and review of laboratory studies, ordering and review of radiographic studies, pulse oximetry, re-evaluation of patient's condition and participation in multidisciplinary  rounds.  Lynnell Catalanavi Jermal Dismuke, MD [facility] ICU Physician [facility]  Pager: [phone] Mobile: [phone] After hours: [phone].   10/12/2018, 10:58 AM

## 2018-10-12 NOTE — ED Notes (Signed)
Pt only moving his lt arm as if posturing no other movement  To any pain stimuli

## 2018-10-12 NOTE — Op Note (Signed)
  4:17 AM  PATIENT:  Christian Sanders  59 y.o. male  PRE-OPERATIVE DIAGNOSIS: Hydrocephalus  POST-OPERATIVE DIAGNOSIS: Hydrocephalus  PROCEDURE: Placement of right frontal intraventricular catheter  SURGEON: Jovita Gamma, MD  ASSISTANTS: Mariana Single, PA  ANESTHESIA:   1% Xylocaine local anesthetic with IV sedation with morphine  EBL: Nil  DICTATION: At the patient's bedside in the ICU, the right frontal region was shaved with clippers, and prepped with Betadine solution.  A 3 mm incision was made adjacent to the coronal suture in the right mid pupillary line.  The line of the incision was infiltrated with 1% Xylocaine local anesthetic.  A twist drill hole was made in the skull, and the dura punctured.  A ventricular catheter was passed into the right frontal horn, and bloody CSF drained vigorously.  The catheter was passed subcutaneously with a trocar.  The catheter was connected to a closed collection system, set to drain at 10 cm of water.  The incision was closed with 3-0 nylon.  The catheter was secured to the skin scalp with two 3-0 nylon sutures.  A dressing was applied.  The procedure was tolerated well.

## 2018-10-12 NOTE — ED Notes (Signed)
Report called to rn on 2m 

## 2018-10-12 NOTE — ED Notes (Signed)
Swallow screen not passed intubated

## 2018-10-12 NOTE — Progress Notes (Signed)
Subjective: Patient continues on ventilator via ETT.  IVC draining well.  She is moving better  Objective: Vital signs in last 24 hours: Vitals:   10/12/18 0800 10/12/18 0815 10/12/18 0845 10/12/18 0900  BP:  122/65 120/66 116/68  Pulse: (!) 121 (!) 116  (!) 102  Resp: 18 18  15   Temp: (!) 100.4 F (38 C) (!) 100.6 F (38.1 C)  (!) 100.6 F (38.1 C)  TempSrc:  Bladder    SpO2: 93% 99%  99%  Weight:      Height:        Intake/Output from previous day: 07/10 0701 - 07/11 0700 In: 1236.6 [I.V.:333.2; Blood:810; IV Piggyback:93.4] Out: 980 [Urine:700; Emesis/NG output:250; Drains:30] Intake/Output this shift: Total I/O In: 285.1 [I.V.:285.1] Out: 145 [Urine:125; Drains:20]  Physical Exam: Not opening eyes to voice or stimulation.  Nonpurposeful withdrawal movement on left to pain, weak decortication to pain on right.   CBC Recent Labs    10-13-2018 2048  2018-10-13 2319 2018-10-13 2327  WBC 9.0  --  9.7  --   HGB 12.7*   < > 12.9* 12.2*  HCT 35.7*   < > 37.6* 36.0*  PLT 128*  --  114*  --    < > = values in this interval not displayed.   BMET Recent Labs    10-13-2018 2048 Oct 13, 2018 2105 Oct 13, 2018 2327 10/12/18 0425  NA 137 138 139 137  K 3.7 3.6 4.0  --   CL 107 109  --   --   CO2 19*  --   --   --   GLUCOSE 130* 133*  --   --   BUN 11 9  --   --   CREATININE 0.81 0.70  --   --   CALCIUM 9.1  --   --   --     Assessment/Plan: Slight neurologic improvement.  Continuing IVC drainage.  Continuing current supportive care.   Hosie Spangle, MD 10/12/2018, 9:26 AM

## 2018-10-13 ENCOUNTER — Inpatient Hospital Stay (HOSPITAL_COMMUNITY): Payer: Medicare Other

## 2018-10-13 ENCOUNTER — Inpatient Hospital Stay: Payer: Self-pay

## 2018-10-13 DIAGNOSIS — I10 Essential (primary) hypertension: Secondary | ICD-10-CM

## 2018-10-13 DIAGNOSIS — I615 Nontraumatic intracerebral hemorrhage, intraventricular: Secondary | ICD-10-CM

## 2018-10-13 DIAGNOSIS — R791 Abnormal coagulation profile: Secondary | ICD-10-CM

## 2018-10-13 DIAGNOSIS — R509 Fever, unspecified: Secondary | ICD-10-CM

## 2018-10-13 DIAGNOSIS — I639 Cerebral infarction, unspecified: Secondary | ICD-10-CM

## 2018-10-13 DIAGNOSIS — J9690 Respiratory failure, unspecified, unspecified whether with hypoxia or hypercapnia: Secondary | ICD-10-CM

## 2018-10-13 DIAGNOSIS — E7220 Disorder of urea cycle metabolism, unspecified: Secondary | ICD-10-CM

## 2018-10-13 DIAGNOSIS — F1910 Other psychoactive substance abuse, uncomplicated: Secondary | ICD-10-CM

## 2018-10-13 DIAGNOSIS — F1011 Alcohol abuse, in remission: Secondary | ICD-10-CM

## 2018-10-13 DIAGNOSIS — G911 Obstructive hydrocephalus: Secondary | ICD-10-CM

## 2018-10-13 DIAGNOSIS — D72829 Elevated white blood cell count, unspecified: Secondary | ICD-10-CM

## 2018-10-13 DIAGNOSIS — G936 Cerebral edema: Secondary | ICD-10-CM

## 2018-10-13 LAB — MAGNESIUM
Magnesium: 2 mg/dL (ref 1.7–2.4)
Magnesium: 2 mg/dL (ref 1.7–2.4)

## 2018-10-13 LAB — BPAM FFP
Blood Product Expiration Date: 202007152359
Blood Product Expiration Date: 202007152359
ISSUE DATE / TIME: 202007110230
ISSUE DATE / TIME: 202007110250
Unit Type and Rh: 7300
Unit Type and Rh: 7300

## 2018-10-13 LAB — GLUCOSE, CAPILLARY
Glucose-Capillary: 123 mg/dL — ABNORMAL HIGH (ref 70–99)
Glucose-Capillary: 127 mg/dL — ABNORMAL HIGH (ref 70–99)
Glucose-Capillary: 128 mg/dL — ABNORMAL HIGH (ref 70–99)
Glucose-Capillary: 130 mg/dL — ABNORMAL HIGH (ref 70–99)
Glucose-Capillary: 132 mg/dL — ABNORMAL HIGH (ref 70–99)
Glucose-Capillary: 158 mg/dL — ABNORMAL HIGH (ref 70–99)

## 2018-10-13 LAB — BASIC METABOLIC PANEL
Anion gap: 6 (ref 5–15)
BUN: 15 mg/dL (ref 6–20)
CO2: 19 mmol/L — ABNORMAL LOW (ref 22–32)
Calcium: 8.8 mg/dL — ABNORMAL LOW (ref 8.9–10.3)
Chloride: 121 mmol/L — ABNORMAL HIGH (ref 98–111)
Creatinine, Ser: 0.74 mg/dL (ref 0.61–1.24)
GFR calc Af Amer: >60 mL/min (ref >60)
GFR calc non Af Amer: >60 mL/min (ref >60)
Glucose, Bld: 139 mg/dL — ABNORMAL HIGH (ref 70–99)
Potassium: 3.6 mmol/L (ref 3.5–5.1)
Sodium: 146 mmol/L — ABNORMAL HIGH (ref 135–145)

## 2018-10-13 LAB — PREPARE FRESH FROZEN PLASMA: Unit division: 0

## 2018-10-13 LAB — SODIUM
Sodium: 148 mmol/L — ABNORMAL HIGH (ref 135–145)
Sodium: 154 mmol/L — ABNORMAL HIGH (ref 135–145)

## 2018-10-13 LAB — CBC
HCT: 31.8 % — ABNORMAL LOW (ref 39.0–52.0)
Hemoglobin: 11.1 g/dL — ABNORMAL LOW (ref 13.0–17.0)
MCH: 37.6 pg — ABNORMAL HIGH (ref 26.0–34.0)
MCHC: 34.9 g/dL (ref 30.0–36.0)
MCV: 107.8 fL — ABNORMAL HIGH (ref 80.0–100.0)
Platelets: 102 10*3/uL — ABNORMAL LOW (ref 150–400)
RBC: 2.95 MIL/uL — ABNORMAL LOW (ref 4.22–5.81)
RDW: 14 % (ref 11.5–15.5)
WBC: 20.6 10*3/uL — ABNORMAL HIGH (ref 4.0–10.5)
nRBC: 0 % (ref 0.0–0.2)

## 2018-10-13 LAB — PHOSPHORUS
Phosphorus: 1.6 mg/dL — ABNORMAL LOW (ref 2.5–4.6)
Phosphorus: 2 mg/dL — ABNORMAL LOW (ref 2.5–4.6)

## 2018-10-13 LAB — PROTIME-INR
INR: 1.7 — ABNORMAL HIGH (ref 0.8–1.2)
Prothrombin Time: 19.8 seconds — ABNORMAL HIGH (ref 11.4–15.2)

## 2018-10-13 LAB — AMMONIA: Ammonia: 98 umol/L — ABNORMAL HIGH (ref 9–35)

## 2018-10-13 LAB — TRIGLYCERIDES: Triglycerides: 47 mg/dL (ref <150)

## 2018-10-13 MED ORDER — SODIUM CHLORIDE 3 % IV SOLN
INTRAVENOUS | Status: DC
  Administered 2018-10-13: 50 mL/h via INTRAVENOUS
  Administered 2018-10-13: 75 mL/h via INTRAVENOUS
  Filled 2018-10-13 (×4): qty 500

## 2018-10-13 MED ORDER — FUROSEMIDE 10 MG/ML IJ SOLN
20.0000 mg | Freq: Once | INTRAMUSCULAR | Status: AC
  Administered 2018-10-13: 20 mg via INTRAVENOUS
  Filled 2018-10-13: qty 2

## 2018-10-13 MED ORDER — FLEET ENEMA 7-19 GM/118ML RE ENEM
1.0000 | ENEMA | Freq: Once | RECTAL | Status: AC
  Administered 2018-10-13: 1 via RECTAL
  Filled 2018-10-13: qty 1

## 2018-10-13 MED ORDER — PANTOPRAZOLE SODIUM 40 MG PO TBEC: 40.0000 mg | DELAYED_RELEASE_TABLET | Freq: Two times a day (BID) | ORAL | Status: DC

## 2018-10-13 MED ORDER — PROPOFOL 1000 MG/100ML IV EMUL
5.0000 ug/kg/min | INTRAVENOUS | Status: DC
  Administered 2018-10-13 (×2): 10 ug/kg/min via INTRAVENOUS
  Administered 2018-10-14 (×2): 30 ug/kg/min via INTRAVENOUS
  Administered 2018-10-14: 20 ug/kg/min via INTRAVENOUS
  Administered 2018-10-15: 30 ug/kg/min via INTRAVENOUS
  Administered 2018-10-15: 40 ug/kg/min via INTRAVENOUS
  Administered 2018-10-15: 30 ug/kg/min via INTRAVENOUS
  Administered 2018-10-15 – 2018-10-16 (×3): 40 ug/kg/min via INTRAVENOUS
  Filled 2018-10-13 (×12): qty 100

## 2018-10-13 MED ORDER — PANTOPRAZOLE SODIUM 40 MG PO PACK
40.0000 mg | PACK | Freq: Two times a day (BID) | ORAL | Status: DC
  Administered 2018-10-13 – 2018-10-19 (×13): 40 mg
  Filled 2018-10-13 (×13): qty 20

## 2018-10-13 MED ORDER — CLEVIDIPINE BUTYRATE 0.5 MG/ML IV EMUL
0.0000 mg/h | INTRAVENOUS | Status: DC
  Filled 2018-10-13: qty 50

## 2018-10-13 MED ORDER — LACTULOSE 10 GM/15ML PO SOLN
10.0000 g | Freq: Three times a day (TID) | ORAL | Status: DC
  Administered 2018-10-13 (×2): 10 g
  Filled 2018-10-13 (×2): qty 15

## 2018-10-13 MED ORDER — PERFLUTREN LIPID MICROSPHERE
INTRAVENOUS | Status: AC
  Filled 2018-10-13: qty 10

## 2018-10-13 MED ORDER — SODIUM CHLORIDE 0.9% IV SOLUTION
Freq: Once | INTRAVENOUS | Status: AC
  Administered 2018-10-13: 13:00:00 via INTRAVENOUS

## 2018-10-13 MED ORDER — RIFAXIMIN 550 MG PO TABS
550.0000 mg | ORAL_TABLET | Freq: Three times a day (TID) | ORAL | Status: DC
  Administered 2018-10-13 – 2018-10-19 (×19): 550 mg
  Filled 2018-10-13 (×19): qty 1

## 2018-10-13 MED ORDER — PROPOFOL 1000 MG/100ML IV EMUL
INTRAVENOUS | Status: AC
  Filled 2018-10-13: qty 100

## 2018-10-13 MED ORDER — PERFLUTREN LIPID MICROSPHERE
1.0000 mL | INTRAVENOUS | Status: AC | PRN
  Administered 2018-10-13: 5 mL via INTRAVENOUS
  Filled 2018-10-13: qty 10

## 2018-10-13 NOTE — Progress Notes (Signed)
eLink Physician-Brief Progress Note Patient Name: Christian Sanders DOB: 1961/03/20 MRN: [id number]   Date of Service  10/13/2018  HPI/Events of Note  Notified by pharmacy of rise in Na from 148 to 154 on 3% sodium at 50 cc/hr for intracranial bleed.  eICU Interventions  Decrease 3% sodium infusion to 20 cc/hr and continue to monitor     Intervention Category Major Interventions: Electrolyte abnormality - evaluation and management  Judd Lien 10/13/2018, 8:41 PM

## 2018-10-13 NOTE — Progress Notes (Signed)
Hypertonic saline switched from L upper arm PIV to R AC PIV at 1700.  Plan to place PICC line tomorrow

## 2018-10-13 NOTE — Progress Notes (Signed)
  Echocardiogram 2D Echocardiogram has been performed.  Christian Sanders 10/13/2018, 4:55 PM

## 2018-10-13 NOTE — Progress Notes (Signed)
Patient transported on vent to CT and returned to 6O37 without complications.

## 2018-10-13 NOTE — Progress Notes (Addendum)
Switched to SIMV/PC/PSV for pt comfort per CCM [place] . RT will continue to monitor. Pt settings PC 20, rate 12, FIO2 30%, PEEP 5 and PS of 10.

## 2018-10-13 NOTE — Progress Notes (Signed)
Subjective: Patient resting in bed, continues on ventilator via ETT.  IVC draining bloody CSF, working well.  Continues on hypertonic (3%) saline per stroke neurology.  Objective: Vital signs in last 24 hours: Vitals:   10/13/18 0043 10/13/18 0100 10/13/18 0200 10/13/18 0311  BP: 128/80 125/88 140/86 127/66  Pulse: 94 (!) 106 (!) 111 (!) 115  Resp: (!) 27 (!) 21 (!) 22 16  Temp:  (!) 100.4 F (38 C) (!) 101.8 F (38.8 C)   TempSrc:      SpO2: 100% 100% 100%   Weight:      Height:        Intake/Output from previous day: 07/11 0701 - 07/12 0700 In: 1312.6 [I.V.:1235.9; NG/GT:76.7] Out: 814 [Urine:685; Drains:129] Intake/Output this shift: Total I/O In: 150 [I.V.:150] Out: 14 [Drains:14]  Physical Exam: Opens eyes weakly to stimulation.  Pupils 2.5 mm bilaterally, round, reactive to light.  Withdrawal to purposeful movements on left, weak withdrawal movement on right.  Not following commands.  CBC Recent Labs    2019-02-07 2319 2019-02-07 2327 10/13/18 0342  WBC 9.7  --  20.6*  HGB 12.9* 12.2* 11.1*  HCT 37.6* 36.0* 31.8*  PLT 114*  --  102*   BMET Recent Labs    2019-02-07 2048 2019-02-07 2105 2019-02-07 2327  10/12/18 2118 10/13/18 0342  NA 137 138 139   < > 144 146*  K 3.7 3.6 4.0  --   --  3.6  CL 107 109  --   --   --  121*  CO2 19*  --   --   --   --  19*  GLUCOSE 130* 133*  --   --   --  139*  BUN 11 9  --   --   --  15  CREATININE 0.81 0.70  --   --   --  0.74  CALCIUM 9.1  --   --   --   --  8.8*   < > = values in this interval not displayed.    Studies/Results: Ct Head Wo Contrast  Result Date: 10/12/2018 CLINICAL DATA:  Follow-up examination for acute intracranial hemorrhage. EXAM: CT HEAD WITHOUT CONTRAST TECHNIQUE: Contiguous axial images were obtained from the base of the skull through the vertex without intravenous contrast. COMPARISON:  Prior CT from 2020-02-619. FINDINGS: Brain: Large intraparenchymal hematoma centered at the left frontoparietal  region, overall similar in size and morphology as compared to previous exam. Surrounding low-density vasogenic edema with regional mass effect. Associated intraventricular extension with intraventricular hemorrhage within the lateral, third, and fourth ventricles. Degree of intraventricular hemorrhage is increased from previous. Temporal horn dilatation compatible with associated hydrocephalus, slightly worsened from previous. Associated left-to-right shift of up to approximately 12 mm, relatively similar. Basilar cistern crowding without transtentorial herniation, similar to previous. No other new acute intracranial hemorrhage. No acute large vessel territory infarct. No extra-axial fluid collection. Vascular: No appreciable hyperdense vessel. Subarachnoid hemorrhage extends along the course of the left MCA, similar to previous. Skull: Scalp soft tissues and calvarium demonstrate no acute finding. Sinuses/Orbits: Globes and orbital soft tissues demonstrate no acute finding. Sequelae of prior ORIF seen at the left face. Scattered mucosal thickening with air-fluid levels within the right maxillary and sphenoid sinuses. Endotracheal and enteric tubes partially visualized. Mastoid air cells remain clear. Other: None. IMPRESSION: 1. No significant interval change in size and morphology of large intraparenchymal hematoma centered at the left frontoparietal region. Associated regional mass effect with 12 mm of left-to-right  midline shift, relatively similar. 2. Intraventricular extension with large volume intraventricular hemorrhage, increased from previous exam. Slightly worsened associated hydrocephalus. 3. No other new acute intracranial abnormality. Electronically Signed   By: Jeannine Boga M.D.   On: 10/12/2018 03:09   Dg Chest Portable 1 View  Result Date: 10/29/2018 CLINICAL DATA:  Evaluate ETT placement EXAM: PORTABLE CHEST 1 VIEW COMPARISON:  October 29, 2016 FINDINGS: The ETT terminates just above the  thoracic inlet at 11 cm above the carina. Recommend advancing 7 cm. No pneumothorax. The cardiomediastinal silhouette is unremarkable given technique. No focal infiltrates. IMPRESSION: 1. The ETT terminates just above the thoracic inlet. Recommend advancing 7 cm. Findings called to Dr. Roderic Palau. Electronically Signed   By: Dorise Bullion III M.D   On: 10/15/2018 21:21   Ct Head Code Stroke Wo Contrast  Result Date: 10/17/2018 CLINICAL DATA:  Code stroke. 58 year old male altered mental status, found unresponsive tonight. Last known normal 1 day ago. EXAM: CT HEAD WITHOUT CONTRAST TECHNIQUE: Contiguous axial images were obtained from the base of the skull through the vertex without intravenous contrast. COMPARISON:  Head CT 05/03/2017. FINDINGS: Brain: Large left posterior frontal/parietal lobe region intra-axial hemorrhage encompassing 54 x 57 x 63 millimeters (AP by transverse by CC) for an estimated blood volume of 97 milliliters. Regional edema and mass effect. Intraventricular extension with moderate ventriculomegaly. There is also a small volume of left convexity subarachnoid hemorrhage. No subdural hematoma identified. There is mild-to-moderate ventriculomegaly with evidence of transependymal edema at the temporal and occipital horns. There is rightward midline shift of 11 millimeters. No uncal herniation at this time. There is partial effacement of the suprasellar cistern. The remaining basilar cisterns are patent. There is blood in the 4th ventricle. Outside the area of hemorrhage gray-white matter differentiation is preserved. Vascular: Mild Calcified atherosclerosis at the skull base. Skull: No acute osseous abnormality identified. Chronic left orbital floor fracture and previous left orbital wall and maxilla reconstruction. Sinuses/Orbits: Moderate new right ethmoid and maxillary sinus mucosal thickening. Other visible paranasal sinuses and mastoids are stable. Other: No acute orbit or scalp soft  tissue findings. ASPECTS [facility] Stroke Program Early CT Score) Total score (0-10 with 10 being normal): Not applicable, acute hemorrhage. IMPRESSION: Large left frontal/parietal lobe junction intra-axial hemorrhage with estimated blood volume of 97 mL. Regional edema and mass effect with 11 mm of rightward midline shift. Intraventricular extension with moderate ventriculomegaly and transependymal edema. Small volume left convexity subarachnoid hemorrhage. Critical Value/emergent results were called by telephone at the time of interpretation on 10/02/2018 at 8:54 pm to Dr. Milton Ferguson , who verbally acknowledged these results. Electronically Signed   By: Genevie Ann M.D.   On: 10/24/2018 20:55    Assessment/Plan: Patient with large dominant hemisphere ICH with secondary IVH, status post placement of IVC.  For follow-up CT of brain without contrast later today.  Continuing current supportive care.   Hosie Spangle, MD 10/13/2018, 4:54 AM

## 2018-10-13 NOTE — Procedures (Signed)
Echo attempted. Patient being transferred to CT. Will attempt again.

## 2018-10-13 NOTE — Progress Notes (Signed)
STROKE TEAM PROGRESS NOTE   SUBJECTIVE (INTERVAL HISTORY) His RN is at the bedside.  Pt still intubated but purposeful on the left and strong moving left side. Not responsive or following commands. INR 1.7 and Ammonia 98. No bowel movement yet. Will need enema. EVD drainage 5-14 cc per hour. Will also need FFP due to INR elevated. CT repeat pending    OBJECTIVE Vitals:   10/13/18 0500 10/13/18 0600 10/13/18 0700 10/13/18 0752  BP: 133/88 123/67 112/67 (!) 146/85  Pulse: (!) 117 (!) 103 98 (!) 112  Resp: 20 16 16  (!) 25  Temp: 99.7 F (37.6 C) 99.5 F (37.5 C) 99 F (37.2 C)   TempSrc:      SpO2: 97% 97% 98% 96%  Weight:      Height:        CBC:  Recent Labs  Lab 07/17/18 2048  07/17/18 2319 07/17/18 2327 10/13/18 0342  WBC 9.0  --  9.7  --  20.6*  NEUTROABS 5.8  --  8.1*  --   --   HGB 12.7*   < > 12.9* 12.2* 11.1*  HCT 35.7*   < > 37.6* 36.0* 31.8*  MCV 104.4*  --  110.3*  --  107.8*  PLT 128*  --  114*  --  102*   < > = values in this interval not displayed.    Basic Metabolic Panel:  Recent Labs  Lab 07/17/18 2048 07/17/18 2105 07/17/18 2327  10/12/18 1618 10/12/18 2118 10/13/18 0342  NA 137 138 139   < > 142 144 146*  K 3.7 3.6 4.0  --   --   --  3.6  CL 107 109  --   --   --   --  121*  CO2 19*  --   --   --   --   --  19*  GLUCOSE 130* 133*  --   --   --   --  139*  BUN 11 9  --   --   --   --  15  CREATININE 0.81 0.70  --   --   --   --  0.74  CALCIUM 9.1  --   --   --   --   --  8.8*  MG  --   --   --    < > 1.9  --  2.0  PHOS  --   --   --    < > 2.4*  --  1.6*   < > = values in this interval not displayed.    Lipid Panel:     Component Value Date/Time   CHOL 188 02/28/2016 0001   TRIG 169 (H) 02/28/2016 0001   HDL 37 (L) 02/28/2016 0001   CHOLHDL 5.1 (H) 02/28/2016 0001   VLDL 34 (H) 02/28/2016 0001   LDLCALC 117 (H) 02/28/2016 0001   HgbA1c:  Lab Results  Component Value Date   HGBA1C 5.0 07/17/2018   Urine Drug Screen:      Component Value Date/Time   LABOPIA NONE DETECTED 07/17/2018 2117   COCAINSCRNUR NONE DETECTED 07/17/2018 2117   COCAINSCRNUR NEG 03/17/2014 1456   LABBENZ POSITIVE (A) 07/17/2018 2117   AMPHETMU POSITIVE (A) 07/17/2018 2117   THCU NONE DETECTED 07/17/2018 2117   LABBARB NONE DETECTED 07/17/2018 2117    Alcohol Level     Component Value Date/Time   ETH <10 07/17/2018 2048    IMAGING  Ct Head Wo Contrast  10/12/2018 IMPRESSION:  1. No significant interval change in size and morphology of large intraparenchymal hematoma centered at the left frontoparietal region. Associated regional mass effect with 12 mm of left-to-right midline shift, relatively similar.  2. Intraventricular extension with large volume intraventricular hemorrhage, increased from previous exam. Slightly worsened associated hydrocephalus.  3. No other new acute intracranial abnormality.   Dg Chest Portable 1 View 10/26/18 IMPRESSION:  1. The ETT terminates just above the thoracic inlet. Recommend advancing 7 cm. 21   Ct Head Code Stroke Wo Contrast 10/26/2018 IMPRESSION:  Large left frontal/parietal lobe junction intra-axial hemorrhage with estimated blood volume of 97 mL. Regional edema and mass effect with 11 mm of rightward midline shift. Intraventricular extension with moderate ventriculomegaly and transependymal edema. Small volume left convexity subarachnoid hemorrhage.   Transthoracic Echocardiogram  00/00/2020 Pending   EKG - ST rate 101 BPM. (See cardiology reading for complete details)   PHYSICAL EXAM  Temp:  [98.2 F (36.8 C)-101.8 F (38.8 C)] 98.4 F (36.9 C) (07/12 0800) Pulse Rate:  [80-117] 110 (07/12 0800) Resp:  [13-27] 20 (07/12 0800) BP: (101-146)/(62-88) 139/71 (07/12 0800) SpO2:  [93 %-100 %] 93 % (07/12 0800) FiO2 (%):  [30 %] 30 % (07/12 0752)  General - Well nourished, well developed, intubated not on sedation.  Ophthalmologic - fundi not visualized due to  noncooperation.  Cardiovascular - Regular rhythm, but tachycardia.  Neuro - intubated not on sedation, eyes closed, not following commands. With forced eye opening, eyes in mid position, not blinking to visual threat, doll's eyes present, not tracking, pupils pinpoint, not reactive. Corneal reflex present on the left but not on the right, gag and cough present. Breathing over the vent.  Facial symmetry not able to test due to ET tube.  Tongue protrusion not cooperative. On pain stimulation, strong moving left arm and leg against gravity and purposeful. RUE and RLE mild withdraw. DTR 1+ and bilateral positive babinski. Sensation, coordination and gait not tested.   ASSESSMENT/PLAN Christian Sanders is a 58 y.o. male with history of  alcohol abuse, liver cirrhosis, hep C, Bipolar, hypertension including portal hypertension presented to [facility] emergency department after being found unresponsive by his wife. He did not receive IV t-PA due to hemorrhage. NS consult -> risks of surgery outweighed benefits.  ICH - Large left parietal ICH and IVH with midline shift - could be HTN in the setting of coagulopathy due to cirrhosis   CT head 2018/10/26 - Large left frontal/parietal lobe junction intra-axial hemorrhage with regional edema and mass effect. Intraventricular extension with moderate ventriculomegaly and transependymal edema. Small volume left convexity subarachnoid hemorrhage.  CT Head 10/12/18 - No significant interval change of hematoma and MLS. Intraventricular extension with large volume intraventricular hemorrhage, increased from previous exam. Slightly worsened associated hydrocephalus.   CT head 10/13/2018 - decreased IVH and stable hematoma  Will consider CTA H&N to rule out AVM or aneurysm  2D Echo - pending  Lacey Jensen Virus 2  - negative  LDL - pending  HgbA1c - 5.0  UDS - positive for amphetamines and benzodiazepines (Pt is on Xanax )  VTE prophylaxis - SCDs  Diet - NPO on  tube feedings  No antithrombotic prior to admission, now on No antithrombotic due to hemorrhage  Ongoing aggressive stroke risk factor management  Therapy recommendations:  pending  Disposition:  Pending  Hydrocephalus  CT repeat 10/12/2018 increased ventriculomegaly and hydrocephalus  Status post EVD  Neurosurgery on board  CT repeat 10/13/2018  decrease IVH  On Keppra 500 twice daily  Cerebral edema  CT head showed mass-effect with midline shift  On 3% saline @ 75  Sodium 142-144-148  Sodium goal 150-155  Sodium every 6 hours  CT head 10/13/2018 improved midline shift down from 12->6 mm  PICC line placement requested  Coagulopathy  Hyperammonemia  Liver cirrhosis  INR 1.4 s/p FFP -> 1.7 s/p FFP, INR <1.5  Ammonia level 55-> 98, increase lactulose dose and give Fleet enema for bowel movement  Monitor INR and ammonia level daily  History of varices  History of hepatitis C  History of portal hypertension  History of alcohol abuse  On rifampin  Fever  T-max 101.8->afebrile  WBC 9.0->9.7->20.6  CCM on board  Consider urine culture, blood culture, CXR if continued fever  Considering empiric antibiotic treatment if continued fever  Hypertension  Stable . BP goal < 160 mm Hg . On Cleviprex . Wean off as able . Long-term BP goal normotensive  Tobacco abuse  Current smoker  Smoking cessation counseling will be provided  Other Stroke Risk Factors  History of ETOH abuse  Obesity, Body mass index is 31.19 kg/m., recommend weight loss, diet and exercise as appropriate   Substance abuse -UDS positive amphetamine  Other Active Problems  Mild anemia due to chronic disease- Hb - 11.1 (macrocytic indices)   Thrombocytopenia due to liver cirrhosis- 128->114->102  Hospital day # 2  This patient is critically ill due to ICH, IVH and cerebral edema, liver cirrhosis, coagulopathy and hyperammonemia and at significant risk of neurological  worsening, death form hematoma expansion, hydrocephalus, seizure, CNS infection, bleeding, encephalopathy. This patient's care requires constant monitoring of vital signs, hemodynamics, respiratory and cardiac monitoring, review of multiple databases, neurological assessment, discussion with family, other specialists and medical decision making of high complexity. I spent 40 minutes of neurocritical care time in the care of this patient.  I discussed with Dr. Denese KillingsAgarwala.  Christian PlanJindong Mose Colaizzi, MD PhD Stroke Neurology 10/13/2018 6:53 PM    To contact Stroke Continuity provider, please refer to [email or web address]. After hours, contact General Neurology

## 2018-10-13 NOTE — Progress Notes (Addendum)
NAME:  Christian Sanders, MRN:  [id number], DOB:  06/27/1960, LOS: 2 ADMISSION DATE:  June 21, 2018, CONSULTATION DATE:  June 21, 2018 REFERRING MD:  [name], CHIEF COMPLAINT:  ICH  Brief History   58 yo M found to have large L parietal hemorrage with intraventricular extension. Intubated at [facility] and [facility]  History of present illness   History obtained from chart review  58 yo M PMH EtOH abuse, alcoholic cirrhosis, GERD, Hep C, HTN, portal hyper who [facility] via EMS as Code Stroke after being found "unresponsive" by wife. Per OSH, patient exhibiting R sided weakness, aphasia, and recent complaint of headache. Last seen normal by wife at 1900 on June 21, 2018. CT Head non con reveals L parietal hemorrhage with ventricular extension. NIHSS score determined to be 19. [facility].   NSGY has been consulted and has requested PCCM admit.  UDS positive for amphetamines and BZD.  INR 1.4 Plt 128  2 FFP has been ordered.   Past Medical History  EtOH abuse Alcoholic Cirrhosis Hep C GERD GI Bleed Gastritis  BPD Panic Disorder PTSD Anxiety Depression Coagulopathy HTN Portal HTN BPH Chronic Pain   Significant Hospital Events   [facility]   Consults:  NSGY Neuro PCCM  Procedures:  7/10 ETT>>>  7/11 R lateral ventricle EVD.  Significant Diagnostic Tests:  7/10 CT Head non-con> Large L posterior frontal/parietal lobe intra-axial hemorrhage measuring 54x57x10163mm, with associated edema and mass effect. Intraventricular extension with ventriculomegaly. Small L SAH. No SDH. Some Ventriculomegaly with edema at temporal and occipital horns. 11mm rightward midline shift. No uncal herniation. Partial effacement of suprasellar cistern. Blood present in 4th ventricle.  CXR 7/10> no acute cardiopulmonary abnormality. ETT at thoracic inlet and 7cm advancement is advised  7/11  CT head prior to EVD redemonstrated  L parietal bleed but now with extensive intraventricular extension and ventricular casting (personally reviewed) 7/12 CT head shows EVD in good position in lateral ventricle. Ventricles appear decompressed and size of hemorrhage has diminished. Micro Data:  7/10 SARS CoV2 > negative  Antimicrobials:  none  Interim history/subjective:  Increased drainage from EVD. More agitation with tachypnea. Not following commands.  Objective   Blood pressure (!) 143/84, pulse 97, temperature 98.4 F (36.9 C), resp. rate (!) 23, height 6\' 2"  (1.88 m), weight 110.2 kg, SpO2 100 %.    Vent Mode: PSV;CPAP FiO2 (%):  [30 %] 30 % Set Rate:  [12 bmp] 12 bmp Vt Set:  [650 mL] 650 mL PEEP:  [5 cmH20] 5 cmH20 Pressure Support:  [5 cmH20-8 cmH20] 5 cmH20   Intake/Output Summary (Last 24 hours) at 10/13/2018 1155 Last data filed at 10/13/2018 1000 Gross per 24 hour  Intake 1619.26 ml  Output 1449 ml  Net 170.26 ml   Filed Weights   08-07-18 2137 10/12/18 0100  Weight: 110.4 kg 110.2 kg    Examination: General: well nourished man appears stated age.  HENT: . ETT secure. Pink mmm. Trachea midline. Anicteric sclera. EVD in place.  Lungs: CTA. Symmetrical chest expansion. Breathing over vent, no accessory muscle recruitment. Wheezing on auscultation. Cardiovascular: RRR s1s2 no rgm. Capillary refill < 3 seconds BUE BLE Abdomen: Soft, round, + bowel sounds x4 No organomegaly  Extremities: Symmetrical bulk and tone. No obvious joint deformity. No cyanosis, no clubbing, no edema  Neuro: R pupil 3mm R pupil 2mm and are reactive bilaterally, with right  gaze preference . Does not follow commands. Gag/cough reflexes intact. Flexor response to pain on left side.. EVD at 10cm with drainage of bright bloody CSF. GU: Foley catheter in place. Skin: Spider Angiomas across neck, chest. Skin is pale, clean, warm, dry.   Resolved Hospital Problem list    Assessment & Plan:    Critically ill due to large L parietal ICH with intraventricular extension requiring titration of anti-hypertensive infusions to prevent hemorrhage extension and administration of hyperosmolar therapies to prevent ICP rise. ICH score 4 predicts poor 30 day prognosis, although age suggests better prognosis.  - May be suitable candidate for intraventricular TPA. -Continue to titrate cardene as necessary to keep SBP 110-140 - currently maintaining BP in range spontaneously. -Wean 3% as increasing hyperchloremic acidosis and cerebral edema appears to be improving by CT. -Keppra for seizure prophylaxis.  Critically ill due to cute Respiratory Failure requiring mechanical ventilation Tolerating PSV but mental status precludes extubation. -Propofol for increasing agitation.  Alcoholic Cirrhosis Mild coagulopathy  Hx Hep C Hyperammonemia  Trend LFTs  Lactulose/rifaximin to avoid concurrent hepatic encephalopathy.  Hyperglycemia SSI   Leukocytosis - likely reactive/chemical meningitis from blood in ventricles.  - observe for now.  Best practice:  Diet: NPO - start tube feeds  Pain/Anxiety/Delirium protocol (if indicated): prn fentanyl only  VAP protocol (if indicated): yes  DVT prophylaxis: SCD GI prophylaxis: protonix  Glucose control: SSI  Mobility: BR  Code Status: FULL  Family Communication: I updated the patient's daughter 7/12. Disposition: admit to ICU   Labs   CBC: Recent Labs  Lab 2018-10-20 2048 10/20/2018 2105 Oct 20, 2018 2319 October 20, 2018 2327 10/13/18 0342  WBC 9.0  --  9.7  --  20.6*  NEUTROABS 5.8  --  8.1*  --   --   HGB 12.7* 12.6* 12.9* 12.2* 11.1*  HCT 35.7* 37.0* 37.6* 36.0* 31.8*  MCV 104.4*  --  110.3*  --  107.8*  PLT 128*  --  114*  --  102*    Basic Metabolic Panel: Recent Labs  Lab 10-20-18 2048 10-20-18 2105 10/20/18 2327 10/12/18 0425 10/12/18 0938 10/12/18 1618 10/12/18 2118 10/13/18 0342  NA 137 138 139 137 139 142 144 148*  146*  K 3.7 3.6  4.0  --   --   --   --  3.6  CL 107 109  --   --   --   --   --  121*  CO2 19*  --   --   --   --   --   --  19*  GLUCOSE 130* 133*  --   --   --   --   --  139*  BUN 11 9  --   --   --   --   --  15  CREATININE 0.81 0.70  --   --   --   --   --  0.74  CALCIUM 9.1  --   --   --   --   --   --  8.8*  MG  --   --   --   --  1.9 1.9  --  2.0  PHOS  --   --   --   --  2.9 2.4*  --  1.6*   GFR: Estimated Creatinine Clearance: 134.6 mL/min (by C-G formula based on SCr of 0.74 mg/dL). Recent Labs  Lab 10-20-18 2048 2018/10/20 2056 October 20, 2018 2319 10/13/18 0342  WBC 9.0  --  9.7 20.6*  LATICACIDVEN  --  3.2*  --   --     Liver Function Tests: Recent Labs  Lab 10/04/2018 2048  AST 49*  ALT 32  ALKPHOS 180*  BILITOT 1.8*  PROT 7.8  ALBUMIN 3.4*   No results for input(s): LIPASE, AMYLASE in the last 168 hours. Recent Labs  Lab 10/23/2018 2048 10/13/18 0342  AMMONIA 55* 98*    ABG    Component Value Date/Time   PHART 7.409 10/07/2018 2327   PCO2ART 31.4 (L) 10/15/2018 2327   PO2ART 291.0 (H) 10/06/2018 2327   HCO3 19.9 (L) 10/04/2018 2327   TCO2 21 (L) 10/27/2018 2327   ACIDBASEDEF 4.0 (H) 10/03/2018 2327   O2SAT 100.0 10/02/2018 2327     Coagulation Profile: Recent Labs  Lab 10/08/2018 2048 10/13/18 0342  INR 1.4* 1.7*    Cardiac Enzymes: No results for input(s): CKTOTAL, CKMB, CKMBINDEX, TROPONINI in the last 168 hours.  HbA1C: Hgb A1c MFr Bld  Date/Time Value Ref Range Status  10/08/2018 11:19 PM 5.0 4.8 - 5.6 % Final    Comment:    (NOTE) Pre diabetes:          5.7%-6.4% Diabetes:              >6.4% Glycemic control for   <7.0% adults with diabetes     CBG: Recent Labs  Lab 10/12/18 1601 10/12/18 1935 10/12/18 2326 10/13/18 0323 10/13/18 0833  GLUCAP 113* 113* 107* 128* 132*   CRITICAL CARE Performed by: Lynnell Catalanavi Meelah Tallo  Total critical care time: 40 minutes  Critical care time was exclusive of separately billable procedures and treating other  patients.  Critical care was necessary to treat or prevent imminent or life-threatening deterioration.  Critical care was time spent personally by me on the following activities: development of treatment plan with patient and/or surrogate as well as nursing, discussions with consultants, evaluation of patient's response to treatment, examination of patient, obtaining history from patient or surrogate, ordering and performing treatments and interventions, ordering and review of laboratory studies, ordering and review of radiographic studies, pulse oximetry, re-evaluation of patient's condition and participation in multidisciplinary rounds.  Lynnell Catalanavi Bladyn Tipps, MD [facility] ICU Physician [facility] [place]  Pager: [phone] Mobile: [phone] After hours: [phone].   10/13/2018, 11:55 AM

## 2018-10-14 ENCOUNTER — Inpatient Hospital Stay (HOSPITAL_COMMUNITY): Payer: Medicare Other

## 2018-10-14 DIAGNOSIS — D689 Coagulation defect, unspecified: Secondary | ICD-10-CM

## 2018-10-14 DIAGNOSIS — F17200 Nicotine dependence, unspecified, uncomplicated: Secondary | ICD-10-CM

## 2018-10-14 LAB — CBC
HCT: 33.6 % — ABNORMAL LOW (ref 39.0–52.0)
Hemoglobin: 11.7 g/dL — ABNORMAL LOW (ref 13.0–17.0)
MCH: 37.6 pg — ABNORMAL HIGH (ref 26.0–34.0)
MCHC: 34.8 g/dL (ref 30.0–36.0)
MCV: 108 fL — ABNORMAL HIGH (ref 80.0–100.0)
Platelets: 110 10*3/uL — ABNORMAL LOW (ref 150–400)
RBC: 3.11 MIL/uL — ABNORMAL LOW (ref 4.22–5.81)
RDW: 14.4 % (ref 11.5–15.5)
WBC: 20.2 10*3/uL — ABNORMAL HIGH (ref 4.0–10.5)
nRBC: 0 % (ref 0.0–0.2)

## 2018-10-14 LAB — GLUCOSE, CAPILLARY
Glucose-Capillary: 104 mg/dL — ABNORMAL HIGH (ref 70–99)
Glucose-Capillary: 121 mg/dL — ABNORMAL HIGH (ref 70–99)
Glucose-Capillary: 130 mg/dL — ABNORMAL HIGH (ref 70–99)
Glucose-Capillary: 133 mg/dL — ABNORMAL HIGH (ref 70–99)
Glucose-Capillary: 93 mg/dL (ref 70–99)
Glucose-Capillary: 98 mg/dL (ref 70–99)

## 2018-10-14 LAB — PREPARE FRESH FROZEN PLASMA
Unit division: 0
Unit division: 0

## 2018-10-14 LAB — BASIC METABOLIC PANEL
Anion gap: 9 (ref 5–15)
BUN: 21 mg/dL — ABNORMAL HIGH (ref 6–20)
CO2: 20 mmol/L — ABNORMAL LOW (ref 22–32)
Calcium: 9.2 mg/dL (ref 8.9–10.3)
Chloride: 124 mmol/L — ABNORMAL HIGH (ref 98–111)
Creatinine, Ser: 0.83 mg/dL (ref 0.61–1.24)
GFR calc Af Amer: >60 mL/min (ref >60)
GFR calc non Af Amer: >60 mL/min (ref >60)
Glucose, Bld: 153 mg/dL — ABNORMAL HIGH (ref 70–99)
Potassium: 3 mmol/L — ABNORMAL LOW (ref 3.5–5.1)
Sodium: 153 mmol/L — ABNORMAL HIGH (ref 135–145)

## 2018-10-14 LAB — BPAM FFP
Blood Product Expiration Date: 202007152359
Blood Product Expiration Date: 202007152359
ISSUE DATE / TIME: 202007121221
ISSUE DATE / TIME: 202007121648
Unit Type and Rh: 7300
Unit Type and Rh: 7300

## 2018-10-14 LAB — SODIUM
Sodium: 157 mmol/L — ABNORMAL HIGH (ref 135–145)
Sodium: 157 mmol/L — ABNORMAL HIGH (ref 135–145)
Sodium: 155 mmol/L — ABNORMAL HIGH (ref 135–145)

## 2018-10-14 LAB — PROTIME-INR
INR: 1.8 — ABNORMAL HIGH (ref 0.8–1.2)
Prothrombin Time: 20.3 seconds — ABNORMAL HIGH (ref 11.4–15.2)

## 2018-10-14 LAB — HEPATIC FUNCTION PANEL
ALT: 38 U/L (ref 0–44)
AST: 60 U/L — ABNORMAL HIGH (ref 15–41)
Albumin: 3 g/dL — ABNORMAL LOW (ref 3.5–5.0)
Alkaline Phosphatase: 124 U/L (ref 38–126)
Bilirubin, Direct: 0.8 mg/dL — ABNORMAL HIGH (ref 0.0–0.2)
Indirect Bilirubin: 3 mg/dL — ABNORMAL HIGH (ref 0.3–0.9)
Total Bilirubin: 3.8 mg/dL — ABNORMAL HIGH (ref 0.3–1.2)
Total Protein: 6.8 g/dL (ref 6.5–8.1)

## 2018-10-14 LAB — LIPID PANEL
Cholesterol: 144 mg/dL (ref 0–200)
HDL: 66 mg/dL (ref >40)
LDL Cholesterol: 70 mg/dL (ref 0–99)
Total CHOL/HDL Ratio: 2.2 RATIO
Triglycerides: 41 mg/dL (ref <150)
VLDL: 8 mg/dL (ref 0–40)

## 2018-10-14 LAB — AMMONIA: Ammonia: 103 umol/L — ABNORMAL HIGH (ref 9–35)

## 2018-10-14 MED ORDER — SODIUM CHLORIDE 0.9% IV SOLUTION: Freq: Once | INTRAVENOUS | Status: DC

## 2018-10-14 MED ORDER — SODIUM CHLORIDE 0.9 % IV SOLN
INTRAVENOUS | Status: DC
  Administered 2018-10-14: 13:00:00 via INTRAVENOUS

## 2018-10-14 MED ORDER — LACTULOSE 10 GM/15ML PO SOLN
30.0000 g | Freq: Two times a day (BID) | ORAL | Status: DC
  Administered 2018-10-15 – 2018-10-16 (×3): 30 g
  Filled 2018-10-14 (×4): qty 45

## 2018-10-14 MED ORDER — SODIUM CHLORIDE 0.9% FLUSH
10.0000 mL | INTRAVENOUS | Status: DC | PRN
  Administered 2018-10-20: 10 mL
  Filled 2018-10-14: qty 40

## 2018-10-14 MED ORDER — SODIUM CHLORIDE 0.9% FLUSH
10.0000 mL | Freq: Two times a day (BID) | INTRAVENOUS | Status: DC
  Administered 2018-10-14: 20 mL
  Administered 2018-10-15 – 2018-10-17 (×4): 10 mL
  Administered 2018-10-18: 20 mL
  Administered 2018-10-18 – 2018-10-20 (×2): 10 mL

## 2018-10-14 MED ORDER — POTASSIUM CHLORIDE 20 MEQ PO PACK
40.0000 meq | PACK | Freq: Once | ORAL | Status: AC
  Administered 2018-10-14: 40 meq via NASOGASTRIC
  Filled 2018-10-14: qty 2

## 2018-10-14 MED ORDER — CEFAZOLIN SODIUM-DEXTROSE 2-4 GM/100ML-% IV SOLN
2.0000 g | Freq: Three times a day (TID) | INTRAVENOUS | Status: DC
  Administered 2018-10-14 – 2018-10-16 (×7): 2 g via INTRAVENOUS
  Filled 2018-10-14 (×7): qty 100

## 2018-10-14 MED ORDER — POTASSIUM PHOSPHATES 15 MMOLE/5ML IV SOLN
30.0000 mmol | Freq: Once | INTRAVENOUS | Status: DC
  Administered 2018-10-14: 30 mmol via INTRAVENOUS
  Filled 2018-10-14: qty 10

## 2018-10-14 MED ORDER — PHYTONADIONE 5 MG PO TABS
10.0000 mg | ORAL_TABLET | Freq: Once | ORAL | Status: AC
  Administered 2018-10-14: 10 mg via NASOGASTRIC
  Filled 2018-10-14: qty 2

## 2018-10-14 MED ORDER — LABETALOL HCL 5 MG/ML IV SOLN: 10.0000 mg | INTRAVENOUS | Status: DC | PRN

## 2018-10-14 MED ORDER — POTASSIUM & SODIUM PHOSPHATES 280-160-250 MG PO PACK
1.0000 | PACK | ORAL | Status: AC
  Administered 2018-10-14 (×2): 1
  Filled 2018-10-14 (×2): qty 1

## 2018-10-14 MED ORDER — CHLORHEXIDINE GLUCONATE CLOTH 2 % EX PADS
6.0000 | MEDICATED_PAD | Freq: Every day | CUTANEOUS | Status: DC
  Administered 2018-10-15 – 2018-10-17 (×4): 6 via TOPICAL

## 2018-10-14 MED ORDER — LACTULOSE 10 GM/15ML PO SOLN
30.0000 g | Freq: Three times a day (TID) | ORAL | Status: DC
  Administered 2018-10-14: 30 g
  Filled 2018-10-14: qty 45

## 2018-10-14 MED FILL — Perflutren Lipid Microsphere IV Susp 1.1 MG/ML: INTRAVENOUS | Qty: 10 | Status: AC

## 2018-10-14 NOTE — Progress Notes (Signed)
Unable to get hold of daughter to sign consent for PICC placement. Floor RN aware. Will follow up.

## 2018-10-14 NOTE — Progress Notes (Signed)
3% stopped, verbal order from Dr. Erlinda Hong. Will continue to monitor. Lianne Bushy RN BSN.

## 2018-10-14 NOTE — Progress Notes (Signed)
STROKE TEAM PROGRESS NOTE   SUBJECTIVE (INTERVAL HISTORY) Pt still intubated and now on propofol for dyssynchrony of the vent. Pt not responsive with sedation. But as per RN, pt more awake alert this am before propofol, still moving left side purposefully. INR today 1.8 and ammonia still elevated. Pt did have several bowel movement.     OBJECTIVE Vitals:   10/14/18 0700 10/14/18 0757 10/14/18 0800 10/14/18 0900  BP: (!) 174/93  118/68 135/75  Pulse: (!) 122 99 98 (!) 103  Resp: (!) 28 (!) 24 (!) 23 (!) 24  Temp: (!) 100.8 F (38.Christian Christian) (!) 100.4 F (38 Sanders) (!) 100.4 F (38 Sanders) 99.7 F (37.6 Sanders)  TempSrc:      SpO2: 92% 92% 91% 93%  Weight:      Height:        CBC:  Recent Labs  Lab 10/30/2018 2048  10/24/2018 2319  10/13/18 0342 10/14/18 0200  WBC 9.0  --  9.7  --  20.6* 20.Christian*  NEUTROABS 5.8  --  8.1*  --   --   --   HGB 12.7*   < > 12.9*   < > 11.1* 11.7*  HCT 35.7*   < > 37.6*   < > 31.8* 33.6*  MCV 104.4*  --  110.3*  --  107.8* 108.0*  PLT 128*  --  114*  --  102* 110*   < > = values in this interval not displayed.    Basic Metabolic Panel:  Recent Labs  Lab 10/13/18 0342 10/13/18 1849 10/14/18 0200 10/14/18 0648  NA 148*  146* 154* 153* 157*  K 3.6  --  3.0*  --   CL 121*  --  124*  --   CO2 19*  --  20*  --   GLUCOSE 139*  --  153*  --   BUN 15  --  21*  --   CREATININE 0.74  --  0.83  --   CALCIUM 8.8*  --  9.Christian  --   MG Christian Christian  --   --   PHOS 1.6* Christian*  --   --     Lipid Panel:     Component Value Date/Time   CHOL 144 10/14/2018 0200   TRIG 41 10/14/2018 0200   HDL 66 10/14/2018 0200   CHOLHDL Christian Christian 10/14/2018 0200   VLDL 8 10/14/2018 0200   LDLCALC 70 10/14/2018 0200   HgbA1c:  Lab Results  Component Value Date   HGBA1C 5.0 10/30/2018   Urine Drug Screen:     Component Value Date/Time   LABOPIA NONE DETECTED 10/03/2018 2117   COCAINSCRNUR NONE DETECTED 10/07/2018 2117   COCAINSCRNUR NEG 03/17/2014 1456   LABBENZ POSITIVE (A) 10/10/2018  2117   AMPHETMU POSITIVE (A) 10/20/2018 2117   THCU NONE DETECTED 10/26/2018 2117   LABBARB NONE DETECTED 10/13/2018 2117    Alcohol Level     Component Value Date/Time   ETH <10 10/08/2018 2048    IMAGING  Ct Head Wo Contrast  10/13/2018 1. Expected evolution of large left posterior frontal and parietal intraparenchymal hemorrhage. Christian. Decompression of the ventricles following ventriculostomy tube placement. 3. Blood products previously seen in the fourth ventricle have cleared. 4. No new hemorrhage. Ct Head Wo Contrast  10/12/2018 1. No significant interval change in size and morphology of large intraparenchymal hematoma centered at the left frontoparietal region. Associated regional mass effect with 12 mm of left-to-right midline shift, relatively similar.  Christian. Intraventricular extension  with large volume intraventricular hemorrhage, increased from previous exam. Slightly worsened associated hydrocephalus.  3. No other new acute intracranial abnormality.   Ct Head Code Stroke Wo Contrast October 21, 2018 Large left frontal/parietal lobe junction intra-axial hemorrhage with estimated blood volume of 97 mL. Regional edema and mass effect with 11 mm of rightward midline shift. Intraventricular extension with moderate ventriculomegaly and transependymal edema. Small volume left convexity subarachnoid hemorrhage.  Dg Chest Portable 1 View 10/14/2018 1. Low lung volumes with bibasilar atelectasis versus airspace consolidation. Christian. Support apparatus as described. 10/30/2018 1. The ETT terminates just above the thoracic inlet. Recommend advancing 7 cm. 21   Transthoracic Echocardiogram  10/13/2018 pending  PHYSICAL EXAM  Temp:  [97.9 F (36.6 Sanders)-101.1 F (38.4 Sanders)] 99.7 F (37.6 Sanders) (07/13 0900) Pulse Rate:  [97-125] 103 (07/13 0900) Resp:  [16-29] 24 (07/13 0900) BP: (105-174)/(68-129) 135/75 (07/13 0900) SpO2:  [91 %-100 %] 93 % (07/13 0900) FiO2 (%):  [30 %-40 %] 40 % (07/13  0800) Weight:  [110.5 kg] 110.5 kg (07/13 0500)  General - Well nourished, well developed, intubated on sedation. Tachypnea on vent  Ophthalmologic - fundi not visualized due to noncooperation.  Cardiovascular - Regular rhythm, but tachycardia.  Neuro - intubated on sedation, eyes closed, not following commands. With forced eye opening, eyes in mid position, not blinking to visual threat, doll's eyes present, not tracking, pupils 58mm, not reactive. Corneal reflex absent, gag and cough absent. Breathing over the vent, tachypnea on vent.  Facial symmetry not able to test due to ET tube.  Tongue protrusion not cooperative. On pain stimulation, slight withdraw on left arm and leg, but no movement of RUE and RLE. Bilateral positive babinski. Sensation, coordination and gait not tested.   ASSESSMENT/Christian Christian is a 58 y.o. male with history of  alcohol abuse, liver cirrhosis, hep Sanders, Bipolar, hypertension including portal hypertension presented to [facility] emergency department after being found unresponsive by his wife. He did not receive IV t-PA due to hemorrhage. NS consult -> risks of surgery outweighed benefits.  ICH - Large left parietal ICH and IVH with midline shift - could be HTN in the setting of coagulopathy due to cirrhosis   CT head 10/15/2018 - Large left frontal/parietal lobe junction intra-axial hemorrhage with regional edema and mass effect. Intraventricular extension with moderate ventriculomegaly and transependymal edema. Small volume left convexity subarachnoid hemorrhage.  CT Head 10/12/18 - No significant interval change of hematoma and MLS. Intraventricular extension with large volume intraventricular hemorrhage, increased from previous exam. Slightly worsened associated hydrocephalus.   CT head 10/13/2018 - decreased IVH and stable hematoma  CTA H&N in am to rule out AVM or aneurysm  2D Echo - pending  Christian Christian  - negative  LDL - 70  HgbA1c -  5.0  UDS - positive for amphetamines and benzodiazepines (Pt is on Xanax )  VTE prophylaxis - SCDs  Diet - NPO on tube feedings  No antithrombotic prior to admission, now on No antithrombotic due to hemorrhage  Therapy recommendations:  pending  Disposition:  Pending  Hydrocephalus  CT repeat 10/12/2018 increased ventriculomegaly and hydrocephalus  Status post EVD  Neurosurgery on board  CT repeat 10/13/2018 decrease IVH  On Keppra 500 twice daily  Started on ancef 7/13  CT, CTA head and neck in am  Cerebral edema  CT head showed mass-effect with midline shift  Placed on 3% saline, drip now off -> on NS @ 50  Sodium 154->157  Sodium goal 150-155  Sodium every 6 hours  CT head 10/13/2018 improved midline shift down from 12->6 mm  PICC line placement pending  CT repeat in am  Coagulopathy  Hyperammonemia  Liver cirrhosis Vitamin K Deficiency  INR 1.4 s/p FFP -> 1.7 s/p FFP ->1.8 FFP and Vit K    INR goal < 1.5  Ammonia level 55-> 98->103, increase lactulose dose and give Fleet enema for bowel movement   Alb 3.0, AST 60, TBIL 3.8, DBIL 0.8, IBILI 3.0  Monitor INR and ammonia level daily  History of varices  History of hepatitis Sanders  History of portal hypertension  History of alcohol abuse  On rifampin  CCM on board  Fever  T-max 101.8->101.1  WBC 9.0->9.7->20.6->20.Christian  Started on ancef 7/13  CCM on board  Respiratory Cx pending  CXR bibasilar atx  Hypertension  Stable . BP goal < 160 mm Hg . Treated with Cleviprex now off . Long-term BP goal normotensive  Tobacco abuse  Current smoker  Smoking cessation counseling will be provided  Other Stroke Risk Factors  History of ETOH abuse  Obesity, Body mass index is 31.28 kg/m., recommend weight loss, diet and exercise as appropriate   Substance abuse -UDS positive amphetamine  Other Active Problems  Mild anemia due to chronic disease- Hb - 11.1->11.7 (macrocytic indices)    Thrombocytopenia due to liver cirrhosis- 128->114->102->110  Hypokalemia 3.0 - replaced  Hospital day # 3  This patient is critically ill due to ICH, IVH and cerebral edema, liver cirrhosis, coagulopathy and hyperammonemia and at significant risk of neurological worsening, death form hematoma expansion, hydrocephalus, seizure, CNS infection, bleeding, encephalopathy. This patient's care requires constant monitoring of vital signs, hemodynamics, respiratory and cardiac monitoring, review of multiple databases, neurological assessment, discussion with family, other specialists and medical decision making of high complexity. I spent 40 minutes of neurocritical care time in the care of this patient.  I discussed with Dr. Vassie LollAlva.  Christian PlanJindong Jerrick Farve, MD PhD Stroke Neurology 10/14/2018 9:56 AM    To contact Stroke Continuity provider, please refer to [email or web address]. After hours, contact General Neurology

## 2018-10-14 NOTE — Progress Notes (Signed)
NAME:  Christian Sanders, MRN:  [id number], DOB:  02/26/1961, LOS: 3 ADMISSION DATE:  10/03/2018, CONSULTATION DATE:  10/20/2018 [facility]FERRING MD:  [name], CHIEF COMPLAINT:  ICH  Brief History   58 yo M found to have large L parietal hemorrage with intraventricular extension. [facility]ub[facility] at [facility] and [facility].  History of present illness   History obtained from chart review  58 yo M PMH EtOH abuse, alcoholic cirrhosis, GERD, Hep C, HTN, portal hypertension who [name] 10/25/2018 as Code Stroke after being found "unresponsive" by wife. Per OSH, patient exhibiting R sided weakness, aphasia, and recent complaint of headache. Last seen normal by wife at 1900 on 10/04/2018. CT Head non con reveals L parietal hemorrhage with ventricular extension. NIHSS score determined to be 19. [facility]. [facility] NS[facility] evaluation.   NSGY consulted and requested PCCM admit.  UDS positive for amphetamines and benzodiazepines.  INR 1.4 Plt 128  2 FFP has been ordered.   Past Medical History  EtOH abuse Alcoholic Cirrhosis Hep C GERD GI Bleed Gastritis  BPD Panic Disorder PTSD Anxiety Depression Coagulopathy HTN Portal HTN BPH Chronic Pain   Significant Hospital Events   [facility]   Consults:  NSGY Neuro PCCM  Procedures:  7/10 ETT>>>  7/11 R lateral ventricle EVD.  Significant Diagnostic Tests:  7/10 CT Head non-con> Large L posterior frontal/parietal lobe intra-axial hemorrhage measuring [id number], with associated edema and mass effect. Intraventricular extension with ventriculomegaly. Small L SAH. No SDH. Some Ventriculomegaly with edema at temporal and occipital horns. 11mm rightward midline shift. No uncal herniation. Partial effacement of suprasellar cistern. Blood present in 4th ventricle.  CXR 7/10> no acute cardiopulmonary abnormality. ETT at thoracic inlet and 7cm  advancement is advised  7/11 CT head prior to EVD redemonstrated  L parietal bleed but now with extensive intraventricular extension and ventricular casting (personally reviewed) 7/12 CT head shows EVD in good position in lateral ventricle. Ventricles appear decompressed and size of hemorrhage has diminished. Micro Data:  7/10 SARS CoV2 > negative  Antimicrobials:  none  Interim history/subjective:  Febrile. Jaundiced. Tachypneic on PVS 5/5.   Objective   Blood pressure (!) 174/93, pulse 99, temperature (!) 100.4 F (38 C), resp. rate (!) 24, height 6\' 2"  (1.88 m), weight 110.5 kg, SpO2 92 %.    Vent Mode: PSV;CPAP FiO2 (%):  [30 %-40 %] 40 % Set Rate:  [12 bmp] 12 bmp Vt Set:  [650 mL] 650 mL PEEP:  [5 cmH20] 5 cmH20 Pressure Support:  [5 cmH20-10 cmH20] 5 cmH20 Plateau Pressure:  [12 cmH20-24 cmH20] 14 cmH20   Intake/Output Summary (Last 24 hours) at 10/14/2018 [id number] Last data filed at 10/14/2018 0700 Gross per 24 hour  Intake 3082.9 ml  Output 2902 ml  Net 180.9 ml   Filed Weights   10/08/2018 2137 10/12/18 0100 10/14/18 0500  Weight: 110.4 kg 110.2 kg 110.5 kg    Examination: General: jaundiced, well developed, critically ill appearing  HENT:  EVD in place-site CDI. Pupils pinpoint, non-reactive.Sclera icteric.   Lungs: BBS clear, full, tachypneic, on PSV.  Cardiovascular: RRR. S1S2. No MRG.  Abdomen: Roudned, mild ascites. Hypoactive BS x4. SNT/ND Extremities: Trace edema to hands and feet.  Neuro: Withdraws in all 4 EXT to noxious stimuli only. Does not blink to threat. + gag. EVD remains at 10cm with drainage of bright bloody CSF. GU: Foley  catheter in place. Skin: Spider Angiomas across neck, chest. Slightly jaundiced, warm, dry.   Resolved Hospital Problem list    Assessment & Plan:  Critically ill due to large L parietal ICH with intraventricular extension requiring titration of anti-hypertensive infusions to prevent hemorrhage extension and administration of  hyperosmolar therapies to prevent ICP rise. ICH score 4 predicts poor 30 day prognosis, although age suggests better prognosis.  Currently not requiring anti-HTN infusions.  Remains on hypertonic saline.  Plan -Prn anti-HTN to keep SBP 110-140 - currently maintaining BP in range  -Wean 3% saline per neuro -continue Keppra for seizure prophylaxis. -EVD per NSGY -maintain euglycemia, normothermia, HOB elevated   Critically ill due to cute Respiratory Failure requiring mechanical ventilation Continues to tolerate PSV but mental status precludes extubation. Plan -continue vent management -continue Propofol for RASS goal 0 to -1 -CXR now as previous revealed ETT above clavicles -prn ABG   Alcoholic Cirrhosis-mild ascites on exam  Mild coagulopathy-INR 1.8 today  Hx Hep C Hyperammonemia-worse today @103  Plan  -Trend LFTs, INR-LFTs pending for today   -Increase Lactulose to 30mg  TID -continue rifaximin to avoid concurrent hepatic encephalopathy. -transfuse 2 U FFP -Vitamin K 10mg  PO x 1 dose -consider GI consult -monitor ascites -will need to use acetaminophen judiciously if fever curve persists in setting of Cirrhosis  Hyperglycemia Plan -SSI   Leukocytosis - likely reactive/chemical meningitis from blood in ventricles.  Plan -did not receive surgical prophylaxis for EVD -pharmacy consult for cefazolin   Best practice:  Diet: NPO - tube feeds per recs Pain/Anxiety/Delirium protocol (if indicated): prn fentanyl, propofol   VAP protocol (if indicated): yes  DVT prophylaxis: SCD GI prophylaxis: protonix  Glucose control: SSI  Mobility: BR  Code Status: FULL  Family Communication: left message on daughter's voicemail-Jessica Orrison.  Disposition: continue ICU   Labs   CBC: Recent Labs  Lab 06/29/2018 2048 06/29/2018 2105 06/29/2018 2319 06/29/2018 2327 10/13/18 0342 10/14/18 0200  WBC 9.0  --  9.7  --  20.6* 20.2*  NEUTROABS 5.8  --  8.1*  --   --   --   HGB 12.7*  12.6* 12.9* 12.2* 11.1* 11.7*  HCT 35.7* 37.0* 37.6* 36.0* 31.8* 33.6*  MCV 104.4*  --  110.3*  --  107.8* 108.0*  PLT 128*  --  114*  --  102* 110*    Basic Metabolic Panel: Recent Labs  Lab 06/29/2018 2048 06/29/2018 2105 06/29/2018 2327  10/12/18 0938 10/12/18 1618 10/12/18 2118 10/13/18 0342 10/13/18 1849 10/14/18 0200 10/14/18 0648  NA 137 138 139   < > 139 142 144 148*  146* 154* 153* 157*  K 3.7 3.6 4.0  --   --   --   --  3.6  --  3.0*  --   CL 107 109  --   --   --   --   --  121*  --  124*  --   CO2 19*  --   --   --   --   --   --  19*  --  20*  --   GLUCOSE 130* 133*  --   --   --   --   --  139*  --  153*  --   BUN 11 9  --   --   --   --   --  15  --  21*  --   CREATININE 0.81 0.70  --   --   --   --   --  0.74  --  0.83  --   CALCIUM 9.1  --   --   --   --   --   --  8.8*  --  9.2  --   MG  --   --   --   --  1.9 1.9  --  2.0 2.0  --   --   PHOS  --   --   --   --  2.9 2.4*  --  1.6* 2.0*  --   --    < > = values in this interval not displayed.   GFR: Estimated Creatinine Clearance: 129.9 mL/min (by C-G formula based on SCr of 0.83 mg/dL). Recent Labs  Lab 2018/10/23 2048 2018-10-23 2056 10-23-2018 2319 10/13/18 0342 10/14/18 0200  WBC 9.0  --  9.7 20.6* 20.2*  LATICACIDVEN  --  3.2*  --   --   --     Liver Function Tests: Recent Labs  Lab October 23, 2018 2048  AST 49*  ALT 32  ALKPHOS 180*  BILITOT 1.8*  PROT 7.8  ALBUMIN 3.4*   No results for input(s): LIPASE, AMYLASE in the last 168 hours. Recent Labs  Lab 23-Oct-2018 2048 10/13/18 0342 10/14/18 0200  AMMONIA 55* 98* 103*    ABG    Component Value Date/Time   PHART 7.409 23-Oct-2018 2327   PCO2ART 31.4 (L) 10/23/2018 2327   PO2ART 291.0 (H) 10-23-2018 2327   HCO3 19.9 (L) 10/23/2018 2327   TCO2 21 (L) 2018-10-23 2327   ACIDBASEDEF 4.0 (H) Oct 23, 2018 2327   O2SAT 100.0 2018-10-23 2327     Coagulation Profile: Recent Labs  Lab 10-23-18 2048 10/13/18 0342 10/14/18 0200  INR 1.4* 1.7* 1.8*     Cardiac Enzymes: No results for input(s): CKTOTAL, CKMB, CKMBINDEX, TROPONINI in the last 168 hours.  HbA1C: Hgb A1c MFr Bld  Date/Time Value Ref Range Status  10-23-2018 11:19 PM 5.0 4.8 - 5.6 % Final    Comment:    (NOTE) Pre diabetes:          5.7%-6.4% Diabetes:              >6.4% Glycemic control for   <7.0% adults with diabetes     CBG: Recent Labs  Lab 10/13/18 1616 10/13/18 2014 10/13/18 2352 10/14/18 0426 10/14/18 0759  GLUCAP 158* 130* 127* 104* 121*   CRITICAL CARE I spent 35 minutes in direct patient care including reviewing data,  discussing with other providers, assessment, planning and stabilization and documentation. Time is exclusive to this patient and does not include procedures.   Francine Graven, MSN, AGACNP  Pager [phone] or if no answer [phone] [facility]

## 2018-10-14 NOTE — Progress Notes (Signed)
1000: Spoke with Dr. Erlinda Hong regarding BP parameters. Per Dr. Erlinda Hong treat blood pressure over 160. PRN medication administration instructions updated per this verbal order. Lianne Bushy RN BSN

## 2018-10-14 NOTE — Progress Notes (Signed)
Subjective: Patient continues on ventilator via ETT.  Sedated with propofol.  IVC continue to drain well, draining bloody CSF.  Follow-up CT of brain without contrast yesterday morning shows substantial lessening of extent of intraventricular hemorrhage, and decreased ventricular size.  No change in Green (as expected).  Objective: Vital signs in last 24 hours: Vitals:   10/14/18 1000 10/14/18 1045 10/14/18 1100 10/14/18 1122  BP: (!) 155/82 (!) 151/81 139/85   Pulse: (!) 115 (!) 105 (!) 104 96  Resp: (!) 32 (!) 28 (!) 28 (!) 27  Temp: 99.3 F (37.4 C) 99.5 F (37.5 C) 99.5 F (37.5 C) 99.5 F (37.5 C)  TempSrc:  Core    SpO2: 93%  93% 95%  Weight:      Height:        Intake/Output from previous day: 07/12 0701 - 07/13 0700 In: 3082.9 [I.V.:1000.4; Blood:442.5; NG/GT:1640] Out: 3135 [Urine:2940; Drains:195] Intake/Output this shift: Total I/O In: 292.7 [I.V.:108.2; NG/GT:80; IV Piggyback:104.5] Out: 406 [Urine:400; Drains:6]  Physical Exam: Not opening eyes to voice or pain.  Not following commands.  Little in the way of movement to painful stimulation.  CBC Recent Labs    10/13/18 0342 10/14/18 0200  WBC 20.6* 20.2*  HGB 11.1* 11.7*  HCT 31.8* 33.6*  PLT 102* 110*   BMET Recent Labs    10/13/18 0342  10/14/18 0200 10/14/18 0648  NA 148*  146*   < > 153* 157*  K 3.6  --  3.0*  --   CL 121*  --  124*  --   CO2 19*  --  20*  --   GLUCOSE 139*  --  153*  --   BUN 15  --  21*  --   CREATININE 0.74  --  0.83  --   CALCIUM 8.8*  --  9.2  --    < > = values in this interval not displayed.    Studies/Results: Ct Head Wo Contrast  Result Date: 10/13/2018 CLINICAL DATA:  Intracranial hemorrhage, known, follow-up. Drain in place. EXAM: CT HEAD WITHOUT CONTRAST TECHNIQUE: Contiguous axial images were obtained from the base of the skull through the vertex without intravenous contrast. COMPARISON:  CT head without contrast 03-Nov-2018 and 10/12/2018 FINDINGS: Brain: There  is expected evolution of large left posterior frontal and parietal parenchymal hemorrhage. Hemorrhage now measures 6.9 x 6.5 x 3.2 cm. Extension into the lateral ventricles is again noted. There is layering blood in the lateral ventricles. Minimal blood is seen into the fourth ventricle, significantly improved. There is decompression of the lateral ventricles following ventriculostomy placement. No new hemorrhage is midline shift is improving, now measuring 6 mm. Vascular: No hyperdense vessel or unexpected calcification. Skull: Right frontal burr hole is present. Calvarium is otherwise intact. Sinuses/Orbits: Postsurgical changes are noted the left anterior maxilla. Fluid in the right maxillary sinus is improved. Scattered ethmoid opacification is again seen. IMPRESSION: 1. Expected evolution of large left posterior frontal and parietal intraparenchymal hemorrhage. 2. Decompression of the ventricles following ventriculostomy tube placement. 3. Blood products previously seen in the fourth ventricle have cleared. 4. No new hemorrhage. Electronically Signed   By: San Morelle M.D.   On: 10/13/2018 11:19   Dg Chest Port 1 View  Result Date: 10/14/2018 CLINICAL DATA:  Respiratory failure. EXAM: PORTABLE CHEST 1 VIEW COMPARISON:  11-03-18 FINDINGS: Endotracheal tube in satisfactory position. Enteric catheter transverses the thorax. Cardiomediastinal silhouette is normal. Mediastinal contours appear intact. There is no evidence of pneumothorax.  Low lung volumes with bibasilar atelectasis versus airspace consolidation. Osseous structures are without acute abnormality. Soft tissues are grossly normal. IMPRESSION: 1. Low lung volumes with bibasilar atelectasis versus airspace consolidation. 2. Support apparatus as described. Electronically Signed   By: Ted Mcalpineobrinka  Dimitrova M.D.   On: 10/14/2018 09:02   [place] Ekg Site Rite  Result Date: 10/13/2018 If Site Rite image not attached, placement could not be  confirmed due to current cardiac rhythm.   Assessment/Plan: Little change from a neurosurgical perspective, but CT shows improvement and IVC continue to drain well.  Case discussed with Dr. Cyril Mourningakesh Alva (CCM), discussing plans for treatment going forward both from a CCM perspective as well as a neurosurgical perspective.  Hewitt ShortsNUDELMAN,ROBERT W, MD 10/14/2018, 11:59 AM

## 2018-10-14 NOTE — Progress Notes (Signed)
Pharmacy Antibiotic Note  Christian Sanders is a 58 y.o. male admitted on 10/15/2018 with right sided weakness and aphasia.  CT showed large ICH and IVH and patient underwent intraventricular catheter placement on 10/12/18.  He as a fever and leukocytosis, so Pharmacy has been consulted for Ancef dosing for surgical prophylaxis.  SCr 0.83, CrCL 100 ml/min, afebrile, WBC 20.2.  Plan: [place] will sign off and follow peripherally.  Thank you for the consult!  Height: 6\' 2"  (188 cm) Weight: 243 lb 9.7 oz (110.5 kg) IBW/kg (Calculated) : 82.2  Temp (24hrs), Avg:99.5 F (37.5 C), Min:97.9 F (36.6 C), Max:101.1 F (38.4 C)  Recent Labs  Lab 10/13/2018 2048 10/29/2018 2056 10/31/2018 2105 10/20/2018 2319 10/13/18 0342 10/14/18 0200  WBC 9.0  --   --  9.7 20.6* 20.2*  CREATININE 0.81  --  0.70  --  0.74 0.83  LATICACIDVEN  --  3.2*  --   --   --   --     Estimated Creatinine Clearance: 129.9 mL/min (by C-G formula based on SCr of 0.83 mg/dL).    Allergies  Allergen Reactions  . Christian Venom Anaphylaxis, Swelling and Other (See Comments)    Throat and Tongue swell  . Ibuprofen Other (See Comments)    Due to liver  . Pineapple Nausea And Vomiting  . Pollen Extract Other (See Comments)    Runny nose, itchy, watery eyes, congestion  . Tylenol [Acetaminophen] Other (See Comments)    Due to liver    Ancef 7/13 >>  7/11 MRSA PCR, COVID- negative  Thomas Rhude D. Mina Marble, PharmD, BCPS, [place] 10/14/2018, 9:13 AM

## 2018-10-15 ENCOUNTER — Inpatient Hospital Stay (HOSPITAL_COMMUNITY): Payer: Medicare Other

## 2018-10-15 ENCOUNTER — Ambulatory Visit (HOSPITAL_COMMUNITY): Admission: RE | Admit: 2018-10-15 | Payer: Medicare Other | Source: Ambulatory Visit

## 2018-10-15 DIAGNOSIS — K7200 Acute and subacute hepatic failure without coma: Secondary | ICD-10-CM

## 2018-10-15 DIAGNOSIS — I610 Nontraumatic intracerebral hemorrhage in hemisphere, subcortical: Secondary | ICD-10-CM

## 2018-10-15 DIAGNOSIS — D696 Thrombocytopenia, unspecified: Secondary | ICD-10-CM

## 2018-10-15 DIAGNOSIS — J9600 Acute respiratory failure, unspecified whether with hypoxia or hypercapnia: Secondary | ICD-10-CM

## 2018-10-15 LAB — POCT I-STAT 7, (LYTES, BLD GAS, ICA,H+H)
Acid-Base Excess: 1 mmol/L (ref 0.0–2.0)
Bicarbonate: 23.4 mmol/L (ref 20.0–28.0)
Calcium, Ion: 1.34 mmol/L (ref 1.15–1.40)
HCT: 30 % — ABNORMAL LOW (ref 39.0–52.0)
Hemoglobin: 10.2 g/dL — ABNORMAL LOW (ref 13.0–17.0)
O2 Saturation: 95 %
Patient temperature: 98.1
Potassium: 3.5 mmol/L (ref 3.5–5.1)
Sodium: 160 mmol/L — ABNORMAL HIGH (ref 135–145)
TCO2: 24 mmol/L (ref 22–32)
pCO2 arterial: 27.4 mmHg — ABNORMAL LOW (ref 32.0–48.0)
pH, Arterial: 7.538 — ABNORMAL HIGH (ref 7.350–7.450)
pO2, Arterial: 62 mmHg — ABNORMAL LOW (ref 83.0–108.0)

## 2018-10-15 LAB — CBC
HCT: 32.8 % — ABNORMAL LOW (ref 39.0–52.0)
Hemoglobin: 11.1 g/dL — ABNORMAL LOW (ref 13.0–17.0)
MCH: 37.4 pg — ABNORMAL HIGH (ref 26.0–34.0)
MCHC: 33.8 g/dL (ref 30.0–36.0)
MCV: 110.4 fL — ABNORMAL HIGH (ref 80.0–100.0)
Platelets: 89 10*3/uL — ABNORMAL LOW (ref 150–400)
RBC: 2.97 MIL/uL — ABNORMAL LOW (ref 4.22–5.81)
RDW: 14.7 % (ref 11.5–15.5)
WBC: 13.2 10*3/uL — ABNORMAL HIGH (ref 4.0–10.5)
nRBC: 0.5 % — ABNORMAL HIGH (ref 0.0–0.2)

## 2018-10-15 LAB — BPAM FFP
Blood Product Expiration Date: 202007140846
Blood Product Expiration Date: 202007182359
ISSUE DATE / TIME: 202007131036
Unit Type and Rh: 7300
Unit Type and Rh: 7300

## 2018-10-15 LAB — GLUCOSE, CAPILLARY
Glucose-Capillary: 104 mg/dL — ABNORMAL HIGH (ref 70–99)
Glucose-Capillary: 90 mg/dL (ref 70–99)
Glucose-Capillary: 95 mg/dL (ref 70–99)
Glucose-Capillary: 97 mg/dL (ref 70–99)
Glucose-Capillary: 98 mg/dL (ref 70–99)
Glucose-Capillary: 99 mg/dL (ref 70–99)

## 2018-10-15 LAB — COMPREHENSIVE METABOLIC PANEL
ALT: 30 U/L (ref 0–44)
AST: 39 U/L (ref 15–41)
Albumin: 2.5 g/dL — ABNORMAL LOW (ref 3.5–5.0)
Alkaline Phosphatase: 102 U/L (ref 38–126)
Anion gap: 8 (ref 5–15)
BUN: 30 mg/dL — ABNORMAL HIGH (ref 6–20)
CO2: 22 mmol/L (ref 22–32)
Calcium: 9 mg/dL (ref 8.9–10.3)
Chloride: 125 mmol/L — ABNORMAL HIGH (ref 98–111)
Creatinine, Ser: 0.91 mg/dL (ref 0.61–1.24)
GFR calc Af Amer: >60 mL/min (ref >60)
GFR calc non Af Amer: >60 mL/min (ref >60)
Glucose, Bld: 110 mg/dL — ABNORMAL HIGH (ref 70–99)
Potassium: 3.3 mmol/L — ABNORMAL LOW (ref 3.5–5.1)
Sodium: 155 mmol/L — ABNORMAL HIGH (ref 135–145)
Total Bilirubin: 3.4 mg/dL — ABNORMAL HIGH (ref 0.3–1.2)
Total Protein: 6.3 g/dL — ABNORMAL LOW (ref 6.5–8.1)

## 2018-10-15 LAB — PREPARE FRESH FROZEN PLASMA

## 2018-10-15 LAB — AMMONIA: Ammonia: 103 umol/L — ABNORMAL HIGH (ref 9–35)

## 2018-10-15 LAB — MAGNESIUM: Magnesium: 2.2 mg/dL (ref 1.7–2.4)

## 2018-10-15 LAB — PHOSPHORUS: Phosphorus: 2.3 mg/dL — ABNORMAL LOW (ref 2.5–4.6)

## 2018-10-15 LAB — PROTIME-INR
INR: 2.2 — ABNORMAL HIGH (ref 0.8–1.2)
Prothrombin Time: 24.1 seconds — ABNORMAL HIGH (ref 11.4–15.2)

## 2018-10-15 LAB — SODIUM
Sodium: 157 mmol/L — ABNORMAL HIGH (ref 135–145)
Sodium: 158 mmol/L — ABNORMAL HIGH (ref 135–145)
Sodium: 156 mmol/L — ABNORMAL HIGH (ref 135–145)

## 2018-10-15 MED ORDER — POTASSIUM & SODIUM PHOSPHATES 280-160-250 MG PO PACK
1.0000 | PACK | ORAL | Status: AC
  Administered 2018-10-15 (×3): 1
  Filled 2018-10-15 (×3): qty 1

## 2018-10-15 MED ORDER — FREE WATER
200.0000 mL | Status: DC
  Administered 2018-10-15 – 2018-10-16 (×7): 200 mL

## 2018-10-15 MED ORDER — IOHEXOL 350 MG/ML SOLN
75.0000 mL | Freq: Once | INTRAVENOUS | Status: AC | PRN
  Administered 2018-10-15: 75 mL via INTRAVENOUS

## 2018-10-15 MED ORDER — POTASSIUM CHLORIDE 20 MEQ/15ML (10%) PO SOLN
40.0000 meq | Freq: Once | ORAL | Status: AC
  Administered 2018-10-15: 40 meq
  Filled 2018-10-15: qty 30

## 2018-10-15 NOTE — Progress Notes (Signed)
Patient transported from 4N28 to CT and back without complication.

## 2018-10-15 NOTE — Progress Notes (Signed)
STROKE TEAM PROGRESS NOTE   SUBJECTIVE (INTERVAL HISTORY) Pt lying in bed, still intubated. On propofol. Breathing over vent but much calmer than yesterday. Still not responsive with sedation. INR up to 2.2. Na 160. Ammonia pending. CTA head and neck no obvious explanation of the lobar ICH. EVD patent.    OBJECTIVE Vitals:   10/15/18 0800 10/15/18 0832 10/15/18 0837 10/15/18 0900  BP: 119/67 125/66  107/62  Pulse: 79 79  73  Resp: 18 19  19   Temp: 98.1 F (36.7 C)   98.2 F (36.8 C)  TempSrc:      SpO2: 96% 96% 96% 93%  Weight:      Height:        CBC:  Recent Labs  Lab 09/05/2018 2048  09/05/2018 2319  10/14/18 0200 10/15/18 0551  WBC 9.0  --  9.7   < > 20.2* 13.2*  NEUTROABS 5.8  --  8.1*  --   --   --   HGB 12.7*   < > 12.9*   < > 11.7* 11.1*  HCT 35.7*   < > 37.6*   < > 33.6* 32.8*  MCV 104.4*  --  110.3*   < > 108.0* 110.4*  PLT 128*  --  114*   < > 110* 89*   < > = values in this interval not displayed.    Basic Metabolic Panel:  Recent Labs  Lab 10/13/18 1849 10/14/18 0200  10/14/18 1958 10/15/18 0551  NA 154* 153*   < > 155* 155*  K  --  3.0*  --   --  3.3*  CL  --  124*  --   --  125*  CO2  --  20*  --   --  22  GLUCOSE  --  153*  --   --  110*  BUN  --  21*  --   --  30*  CREATININE  --  0.83  --   --  0.91  CALCIUM  --  9.2  --   --  9.0  MG 2.0  --   --   --  2.2  PHOS 2.0*  --   --   --  2.3*   < > = values in this interval not displayed.    Lipid Panel:     Component Value Date/Time   CHOL 144 10/14/2018 0200   TRIG 41 10/14/2018 0200   HDL 66 10/14/2018 0200   CHOLHDL 2.2 10/14/2018 0200   VLDL 8 10/14/2018 0200   LDLCALC 70 10/14/2018 0200   HgbA1c:  Lab Results  Component Value Date   HGBA1C 5.0 06/04/202020   Urine Drug Screen:     Component Value Date/Time   LABOPIA NONE DETECTED 06/04/202020 2117   COCAINSCRNUR NONE DETECTED 06/04/202020 2117   COCAINSCRNUR NEG 03/17/2014 1456   LABBENZ POSITIVE (A) 06/04/202020 2117   AMPHETMU  POSITIVE (A) 06/04/202020 2117   THCU NONE DETECTED 06/04/202020 2117   LABBARB NONE DETECTED 06/04/202020 2117    Alcohol Level     Component Value Date/Time   ETH <10 06/04/202020 2048    IMAGING Ct Angio Head W Or Wo Contrast Ct Angio Neck W Or Wo Contrast 10/15/2018 1. No visible explanation for lobar hemorrhage. 2. 3 mm aneurysm or infundibulum at the right carotid terminus. 3. 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 4. Atherosclerosis without flow limiting stenosis of major vessels. 5. Stable shunted ventricular  volume.  No repeat hemorrhage.    Ct Head Wo Contrast  10/13/2018 1. Expected evolution of large left posterior frontal and parietal intraparenchymal hemorrhage. 2. Decompression of the ventricles following ventriculostomy tube placement. 3. Blood products previously seen in the fourth ventricle have cleared. 4. No new hemorrhage. Ct Head Wo Contrast  10/12/2018 1. No significant interval change in size and morphology of large intraparenchymal hematoma centered at the left frontoparietal region. Associated regional mass effect with 12 mm of left-to-right midline shift, relatively similar.  2. Intraventricular extension with large volume intraventricular hemorrhage, increased from previous exam. Slightly worsened associated hydrocephalus.  3. No other new acute intracranial abnormality.   Ct Head Code Stroke Wo Contrast 11/06/2018 Large left frontal/parietal lobe junction intra-axial hemorrhage with estimated blood volume of 97 mL. Regional edema and mass effect with 11 mm of rightward midline shift. Intraventricular extension with moderate ventriculomegaly and transependymal edema. Small volume left convexity subarachnoid hemorrhage.  Dg Chest Portable 1 View 10/14/2018 1. Low lung volumes with bibasilar atelectasis versus airspace consolidation. 2. Support apparatus as described. 2018-11-06 1. The ETT terminates just above  the thoracic inlet. Recommend advancing 7 cm. 21   Transthoracic Echocardiogram  10/13/2018 pending  PHYSICAL EXAM  Temp:  [98.1 F (36.7 C)-100.8 F (38.2 C)] 98.2 F (36.8 C) (07/14 0900) Pulse Rate:  [73-108] 73 (07/14 0900) Resp:  [17-29] 19 (07/14 0900) BP: (107-151)/(51-96) 107/62 (07/14 0900) SpO2:  [91 %-100 %] 93 % (07/14 0900) FiO2 (%):  [30 %-40 %] 30 % (07/14 0837) Weight:  [111.3 kg] 111.3 kg (07/14 0200)  General - Well nourished, well developed, intubated on sedation. Sclera jaundice.   Ophthalmologic - fundi not visualized due to noncooperation.  Cardiovascular - Regular rate and rhythm.  Neuro - intubated on sedation, eyes closed, not following commands. With forced eye opening, eyes in mid position, not blinking to visual threat, doll's eyes present, not tracking, pupils 31mm, not reactive. Corneal reflex absent on the right but weak on the left, gag and cough present. Breathing over the vent.  Facial symmetry not able to test due to ET tube.  Tongue protrusion not cooperative. On pain stimulation, slight withdraw on all extremities. Bilateral positive babinski. Sensation, coordination and gait not tested.   ASSESSMENT/PLAN [name] is a 58 y.o. male with history of  alcohol abuse, liver cirrhosis, hep C, Bipolar, hypertension including portal hypertension presented to [facility] emergency department after being found unresponsive by his wife. He did not receive IV t-PA due to hemorrhage. NS consult -> risks of surgery outweighed benefits.  ICH - Large left parietal ICH and IVH with midline shift - could be HTN in the setting of coagulopathy and thrombocytopenia due to cirrhosis   CT head 11-06-2018 - Large left frontal/parietal lobe junction intra-axial hemorrhage with regional edema and mass effect. Intraventricular extension with moderate ventriculomegaly and transependymal edema. Small volume left convexity subarachnoid hemorrhage.  CT Head 10/12/18 - No  significant interval change of hematoma and MLS. Intraventricular extension with large volume intraventricular hemorrhage, increased from previous exam. Slightly worsened associated hydrocephalus.   CT head 10/13/2018 - decreased IVH and stable hematoma  CTA H&N neg for source of ICH  2D Echo - pending  [employer] Virus 2  - negative  LDL - 70  HgbA1c - 5.0  UDS - positive for amphetamines and benzodiazepines (Pt is on Xanax )  VTE prophylaxis - SCDs  Diet - NPO on tube feedings  No antithrombotic prior to admission,  now on No antithrombotic due to hemorrhage  Therapy recommendations:  pending  Disposition:  Pending  Hydrocephalus  CT repeat 10/12/2018 increased ventriculomegaly and hydrocephalus  Status post EVD  Neurosurgery on board  CT repeat 10/13/2018 decrease IVH  CT head 7/14 stable shunted volume  On Keppra 500 twice daily  Started on ancef 7/13  Increased pop off to 15 with plan to increase at 8p   CT head repeat 7/16  Cerebral edema  CT head showed mass-effect with midline shift  off 3% saline -> now on free water  Sodium 154->157->160  Sodium goal 150-155  Sodium every 6 hours  CT head 10/13/2018 improved midline shift down from 12->6 mm  CT head 7/14 stable shunted volume  CT head repeat 7/16  Coagulopathy  Hyperammonemia  Alcoholic Liver cirrhosis Vitamin K Deficiency Hepatitis C decompensated liver failure  INR 1.4 s/p FFP -> 1.7 s/p FFP ->1.8 FFP and Vit K->2.2  INR goal < 1.5  Ammonia level 55-> 98->103->pending, on lactulose    Alb 3.0, AST 60, TBIL 3.8, DBIL 0.8, IBILI 3.0  Thrombocytopenia 128->114->102->110->89  Monitor INR and ammonia level daily  History of varices  History of hepatitis C  History of portal hypertension  History of alcohol abuse  On rifampin  CCM on board  Fever  T-max 101.8->101.1->100.8  WBC 9.0->9.7->20.6->20.2->13.2  Started on ancef 7/13  CCM on board  Respiratory Cx rare  WBC, mod GNR, mod GPC - reincubated  CXR bibasilar atx  Acute Respiratory Failure in setting of ICH Possible aspiration  CCM on board  Still on vent with sedation  Hypertension  Stable . BP goal < 160 mm Hg . Treated with Cleviprex now off . Long-term BP goal normotensive  Tobacco abuse  Current smoker  Smoking cessation counseling will be provided  Other Stroke Risk Factors  History of ETOH abuse  Obesity, Body mass index is 31.5 kg/m., recommend weight loss, diet and exercise as appropriate   Substance abuse -UDS positive amphetamine  Other Active Problems  Mild anemia due to chronic disease- Hb - 11.1->11.7->11.1->10.2 (macrocytic indices)   Thrombocytopenia due to liver cirrhosis- 128->114->102->110->89  Hypokalemia 3.0 - replaced - 3.3 replaced -> 3.5  Hospital day # 4  This patient is critically ill due to ICH, IVH and cerebral edema, liver cirrhosis, coagulopathy and hyperammonemia and at significant risk of neurological worsening, death form hematoma expansion, hydrocephalus, seizure, CNS infection, bleeding, encephalopathy. This patient's care requires constant monitoring of vital signs, hemodynamics, respiratory and cardiac monitoring, review of multiple databases, neurological assessment, discussion with family, other specialists and medical decision making of high complexity. I spent 40 minutes of neurocritical care time in the care of this patient.  I discussed with CCM NP.  Marvel PlanJindong Rulon Abdalla, MD PhD Stroke Neurology 10/15/2018 10:20 AM    To contact Stroke Continuity provider, please refer to [email or web address]. After hours, contact General Neurology

## 2018-10-15 NOTE — Progress Notes (Addendum)
NAME:  Christian Sanders, MRN:  [id number], DOB:  08/07/1960, LOS: 4 ADMISSION DATE:  06/10/18, CONSULTATION DATE:  06/10/18 REFERRING MD:  [name], CHIEF COMPLAINT:  ICH  Brief History   58 yo M found to have large L parietal hemorrage with intraventricular extension. [facility] and [facility]. UDS + for amphetamines / benzo's.  FFP, Vit K given.  To OR 7/11 for placement of right frontal intraventricular catheter.   Past Medical History  EtOH abuse Alcoholic Cirrhosis Hep C GERD GI Bleed Gastritis  BPD Panic Disorder PTSD Anxiety Depression Coagulopathy HTN Portal HTN BPH Chronic Pain   Significant Hospital Events   [facility]   Consults:  NSGY Neuro PCCM  Procedures:  ETT 7/10 >>  R lateral ventricle EVD 7/10>>  Significant Diagnostic Tests:  7/10 CT Head non-con> Large L posterior frontal/parietal lobe intra-axial hemorrhage measuring 54x57x6463mm, with associated edema and mass effect. Intraventricular extension with ventriculomegaly. Small L SAH. No SDH. Some Ventriculomegaly with edema at temporal and occipital horns. 11mm rightward midline shift. No uncal herniation. Partial effacement of suprasellar cistern. Blood present in 4th ventricle.  CXR 7/10 >> no acute cardiopulmonary abnormality. ETT at thoracic inlet and 7cm advancement is advised  CT Head (prior to EVD) 7/11 >> redemonstrated  L parietal bleed but now with extensive intraventricular extension and ventricular casting (personally reviewed) CT Head 7/12 >> EVD in good position in lateral ventricle. Ventricles appear decompressed and size of hemorrhage has diminished. 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  Stable shunted ventricular volume.    Micro Data:  7/10 SARS CoV2 > negative   Antimicrobials:  Cefazolin 7/13 >>  Interim history/subjective:  Afebrile.  On propofol / RN reports significant coughing on WUA but no follow commands.    Objective   Blood pressure 107/62, pulse 73, temperature 98.2 F (36.8 C), resp. rate 19, height 6\' 2"  (1.88 m), weight 111.3 kg, SpO2 93 %.    Vent Mode: SIMV/PC/PS FiO2 (%):  [30 %-40 %] 30 % Set Rate:  [12 bmp] 12 bmp PEEP:  [5 cmH20] 5 cmH20 Pressure Support:  [5 cmH20-10 cmH20] 10 cmH20 Plateau Pressure:  [15 cmH20-20 cmH20] 15 cmH20   Intake/Output Summary (Last 24 hours) at 10/15/2018 [id number] Last data filed at 10/15/2018 0900 Gross per 24 hour  Intake 2689.79 ml  Output 1867 ml  Net 822.79 ml   Filed Weights   10/12/18 0100 10/14/18 0500 10/15/18 0200  Weight: 110.2 kg 110.5 kg 111.3 kg    Examination: General: ill appearing adult male lying in bed on vent in NAD HEENT: MM pink/moist, ETT, scleral icterus, EVD in place on R at 15 cm H2O with pink/bloody drainage Neuro: sedate on vent  CV: s1s2 rrr, no m/r/g PULM: even/non-labored, lungs bilaterally clear  GI: soft, round, bsx4 active  Extremities: warm/dry, trace generalized edema  Skin: no rashes or lesions.  Jaundiced  Resolved Hospital Problem list    Assessment & Plan:   Large L parietal ICH with Intraventricular Extension  -requiring titration of anti-hypertensive infusions to prevent hemorrhage extension and administration of hyperosmolar therapies to prevent ICP rise. -ICH score 4 predicts poor 30 day prognosis, although age suggests better prognosis.  -s/p 3% NS P: Post operative plan per NSGY > current rec's are to  increase EVD to 15cm H2O, then rise to 20 at 8pm, follow up CT imagine on 7/16 Neurology following Keppra for seizure prophylaxis Maintain normothermia, euglycemia, HOB elevated  Acute Respiratory Failure requiring mechanical ventilation in setting of ICH -tolerates PSV but mental status remains barrier  P: Change to PRVC 8 cc/kg,  rate 12 Wean PEEP / FiO2 for sats > 90% Follow intermittent CXR  Propofol for RASS 0 to -1  Follow up abg in 1 hour post changes above   Possible Aspiration P: ABX as above Follow intermittent CXR   Alcoholic Cirrhosis Coagulopathy   Hx Hep C Hyperammonemia  P: Follow LFT's, INR Lactulose 30 mg TID  Continue Rifaximin  Caution with acetaminophen use given cirrhosis  No evidence for bleeding, no indication for transfusion at this time.  Discussed with Attending MD  Avoid hepatotoxic agents   Hyperglycemia P: SSI   Leukocytosis  -likely reactive/chemical meningitis from blood in ventricles.  P: Monitor WBC / fever curve  Continue cefazolin  -did not receive surgical prophylaxis for EVD -pharmacy consult for cefazolin   Best practice:  Diet: NPO - tube feeds per recs Pain/Anxiety/Delirium protocol (if indicated): prn fentanyl, propofol   VAP protocol (if indicated): yes  DVT prophylaxis: SCD GI prophylaxis: protonix  Glucose control: SSI  Mobility: BR  Code Status: FULL  Family Communication: left message on daughter's voicemail-Jessica Westman.  Disposition: continue ICU   Labs   CBC: Recent Labs  Lab 10/09/2018 2048  10/12/2018 2319 10/15/2018 2327 10/13/18 0342 10/14/18 0200 10/15/18 0551  WBC 9.0  --  9.7  --  20.6* 20.2* 13.2*  NEUTROABS 5.8  --  8.1*  --   --   --   --   HGB 12.7*   < > 12.9* 12.2* 11.1* 11.7* 11.1*  HCT 35.7*   < > 37.6* 36.0* 31.8* 33.6* 32.8*  MCV 104.4*  --  110.3*  --  107.8* 108.0* 110.4*  PLT 128*  --  114*  --  102* 110* 89*   < > = values in this interval not displayed.    Basic Metabolic Panel: Recent Labs  Lab 10/09/2018 2048 10/19/2018 2105 10/31/2018 2327  10/12/18 0938 10/12/18 1618  10/13/18 0342 10/13/18 1849 10/14/18 0200 10/14/18 0648 10/14/18 1436 10/14/18 1958 10/15/18 0551  NA 137 138 139   < > 139 142   < > 148*  146* 154* 153* 157* 157* 155* 155*  K 3.7 3.6 4.0  --   --   --   --  3.6  --  3.0*  --   --    --  3.3*  CL 107 109  --   --   --   --   --  121*  --  124*  --   --   --  125*  CO2 19*  --   --   --   --   --   --  19*  --  20*  --   --   --  22  GLUCOSE 130* 133*  --   --   --   --   --  139*  --  153*  --   --   --  110*  BUN 11 9  --   --   --   --   --  15  --  21*  --   --   --  30*  CREATININE 0.81 0.70  --   --   --   --   --  0.74  --  0.83  --   --   --  0.91  CALCIUM 9.1  --   --   --   --   --   --  8.8*  --  9.2  --   --   --  9.0  MG  --   --   --   --  1.9 1.9  --  2.0 2.0  --   --   --   --  2.2  PHOS  --   --   --   --  2.9 2.4*  --  1.6* 2.0*  --   --   --   --  2.3*   < > = values in this interval not displayed.   GFR: Estimated Creatinine Clearance: 118.8 mL/min (by C-G formula based on SCr of 0.91 mg/dL). Recent Labs  Lab 10-30-2018 2056 October 30, 2018 2319 10/13/18 0342 10/14/18 0200 10/15/18 0551  WBC  --  9.7 20.6* 20.2* 13.2*  LATICACIDVEN 3.2*  --   --   --   --     Liver Function Tests: Recent Labs  Lab October 30, 2018 2048 10/14/18 0200 10/15/18 0551  AST 49* 60* 39  ALT 32 38 30  ALKPHOS 180* 124 102  BILITOT 1.8* 3.8* 3.4*  PROT 7.8 6.8 6.3*  ALBUMIN 3.4* 3.0* 2.5*   No results for input(s): LIPASE, AMYLASE in the last 168 hours. Recent Labs  Lab 30-Oct-2018 2048 10/13/18 0342 10/14/18 0200  AMMONIA 55* 98* 103*    ABG    Component Value Date/Time   PHART 7.409 10/30/18 2327   PCO2ART 31.4 (L) 10-30-2018 2327   PO2ART 291.0 (H) Oct 30, 2018 2327   HCO3 19.9 (L) 2018/10/30 2327   TCO2 21 (L) Oct 30, 2018 2327   ACIDBASEDEF 4.0 (H) 10/30/18 2327   O2SAT 100.0 2018/10/30 2327     Coagulation Profile: Recent Labs  Lab 30-Oct-2018 2048 10/13/18 0342 10/14/18 0200 10/15/18 0551  INR 1.4* 1.7* 1.8* 2.2*    Cardiac Enzymes: No results for input(s): CKTOTAL, CKMB, CKMBINDEX, TROPONINI in the last 168 hours.  HbA1C: Hgb A1c MFr Bld  Date/Time Value Ref Range Status  October 30, 2018 11:19 PM 5.0 4.8 - 5.6 % Final    Comment:    (NOTE) Pre  diabetes:          5.7%-6.4% Diabetes:              >6.4% Glycemic control for   <7.0% adults with diabetes     CBG: Recent Labs  Lab 10/14/18 1535 10/14/18 1957 10/14/18 2352 10/15/18 0331 10/15/18 0750  GLUCAP 130* 98 93 90 98    CC Time: 30 minutes  Noe Gens, NP-C [facility] Pgr: [phone] or if no answer [phone] 10/15/2018, 9:39 AM

## 2018-10-15 NOTE — Progress Notes (Signed)
Pt breathing has become more tachypneic and labored in the later part of the afternoon, pt responded to increase in Propofol and PRN dose of Fentanyl.  V Costella PA from neurosurgery called in regard to IVC drain and decision of whether or not to continue to raise drain per Dr. Donnella Bi order at 2000.  We will continue to raise the drain at 2000 and if he continues to decompensate then we will drop the drain back down 10 cmH20.

## 2018-10-15 NOTE — Progress Notes (Signed)
Subjective: Patient continues on ventilator via ETT.  Sedated with propofol.  IVC continue to drain well, but CSF is becoming less bloody and more blood tinged (pink, slightly xanthochromic).  CT of brain done early this morning shows further clearing of intraventricular hemorrhage, with normal ventricular size, there may be some slight diminishment in the size of the [place].  The overall appearance of the CT is improving with the cisterns widely open as are the sulci, other than immediately adjacent to the [place].  Sodium 155 this morning.  Objective: Vital signs in last 24 hours: Vitals:   10/15/18 0700 10/15/18 0800 10/15/18 0832 10/15/18 0837  BP: 114/67 119/67 125/66   Pulse: 82 79 79   Resp: 18 18 19    Temp: 98.1 F (36.7 C) 98.1 F (36.7 C)    TempSrc:      SpO2: 95% 96% 96% 96%  Weight:      Height:        Intake/Output from previous day: 07/13 0701 - 07/14 0700 In: 2703.7 [I.V.:1356; NG/GT:960; IV Piggyback:387.7] Out: 2035 [Urine:1900; Drains:135] Intake/Output this shift: Total I/O In: 63.6 [I.V.:63.6] Out: 3 [Drains:3]  Physical Exam: Not opening eyes to voice or pain.  Not following commands.  Minimal movement of extremities to painful stimulation.  CBC Recent Labs    10/14/18 0200 10/15/18 0551  WBC 20.2* 13.2*  HGB 11.7* 11.1*  HCT 33.6* 32.8*  PLT 110* 89*   BMET Recent Labs    10/14/18 0200  10/14/18 1958 10/15/18 0551  NA 153*   < > 155* 155*  K 3.0*  --   --  3.3*  CL 124*  --   --  125*  CO2 20*  --   --  22  GLUCOSE 153*  --   --  110*  BUN 21*  --   --  30*  CREATININE 0.83  --   --  0.91  CALCIUM 9.2  --   --  9.0   < > = values in this interval not displayed.    Studies/Results: Ct Angio Head W Or Wo Contrast  Result Date: 10/15/2018 CLINICAL DATA:  Follow-up intracranial hemorrhage EXAM: CT ANGIOGRAPHY HEAD AND NECK TECHNIQUE: Multidetector CT imaging of the head and neck was performed using the standard protocol during bolus  administration of intravenous contrast. Multiplanar CT image reconstructions and MIPs were obtained to evaluate the vascular anatomy. Carotid stenosis measurements (when applicable) are obtained utilizing NASCET criteria, using the distal internal carotid diameter as the denominator. CONTRAST:  12mL OMNIPAQUE IOHEXOL 350 MG/ML SOLN COMPARISON:  Head CT from 2 days ago FINDINGS: CT HEAD FINDINGS Brain: Unchanged left posterior frontal and parietal parenchymal hemorrhage with rim of edema. The shape and extent is unchanged. Decreasing density of intraventricular clot, although still extensive at the level of the lateral ventricles. Right frontal ventriculostomy with tip near the foramina [place]. Stable ventricular volume. Midline shift measures 6 mm from left to right, essentially stable. Small volume subarachnoid hemorrhage mainly along the right cerebral convexity, stable. Negative for interval infarct. Vascular: See below Skull: Negative Sinuses: Intubation with nasopharyngeal fluid/debris. Essentially stable right maxillary and ethmoid sinus opacification Orbits: Remote orbital rim repair on the left.  No acute finding Review of the MIP images confirms the above findings CTA NECK FINDINGS Aortic arch: Normal.  Three vessel branching Right carotid system: No flow limiting stenosis or plaque ulceration. Tortuous distal ICA with pseudoaneurysm at the skull base measuring 8 mm in diameter. Aneurysm projects posteriorly and  is smoothly contoured. Left carotid system: Atherosclerotic plaque without ulceration or flow limiting stenosis at the ICA bulb. Vertebral arteries: No proximal subclavian stenosis or ulceration. Both vertebral arteries are smooth and widely patent Skeleton: No acute or aggressive finding Other neck: Intubation with unremarkable tube positioning Upper chest: Minimally visualized opacity in the dependent left lung, likely atelectasis Review of the MIP images confirms the above findings CTA HEAD  FINDINGS Anterior circulation: Atherosclerotic plaque on the carotid siphons with mild narrowing at the left paraclinoid ICA. No evidence of underlying vascular malformation or aneurysm at the level of lobar hemorrhage. There is a 2.7 mm aneurysm or infundibulum projecting medially and posteriorly at the right carotid terminus. No vessel beading or major branch occlusion Posterior circulation: The vertebral and basilar arteries are smooth and diffusely patent atheromatous irregularity of the posterior cerebral arteries with mild borderline moderate narrowing at the right P2 segment. Venous sinuses: Patent as permitted by contrast timing Anatomic variants: None significant Delayed phase: Not obtained Review of the MIP images confirms the above findings IMPRESSION: 1. No visible explanation for lobar hemorrhage. 2. 3 mm aneurysm or infundibulum at the right carotid terminus. 3. 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 4. Atherosclerosis without flow limiting stenosis of major vessels. 5. Stable shunted ventricular volume.  No repeat hemorrhage. Electronically Signed   By: Marnee SpringJonathon  Watts M.D.   On: 10/15/2018 08:09   Ct Head Wo Contrast  Result Date: 10/13/2018 CLINICAL DATA:  Intracranial hemorrhage, known, follow-up. Drain in place. EXAM: CT HEAD WITHOUT CONTRAST TECHNIQUE: Contiguous axial images were obtained from the base of the skull through the vertex without intravenous contrast. COMPARISON:  CT head without contrast 2018/11/10 and 10/12/2018 FINDINGS: Brain: There is expected evolution of large left posterior frontal and parietal parenchymal hemorrhage. Hemorrhage now measures 6.9 x 6.5 x 3.2 cm. Extension into the lateral ventricles is again noted. There is layering blood in the lateral ventricles. Minimal blood is seen into the fourth ventricle, significantly improved. There is decompression of the lateral ventricles following ventriculostomy  placement. No new hemorrhage is midline shift is improving, now measuring 6 mm. Vascular: No hyperdense vessel or unexpected calcification. Skull: Right frontal burr hole is present. Calvarium is otherwise intact. Sinuses/Orbits: Postsurgical changes are noted the left anterior maxilla. Fluid in the right maxillary sinus is improved. Scattered ethmoid opacification is again seen. IMPRESSION: 1. Expected evolution of large left posterior frontal and parietal intraparenchymal hemorrhage. 2. Decompression of the ventricles following ventriculostomy tube placement. 3. Blood products previously seen in the fourth ventricle have cleared. 4. No new hemorrhage. Electronically Signed   By: Marin Robertshristopher  Mattern M.D.   On: 10/13/2018 11:19   Ct Angio Neck W Or Wo Contrast  Result Date: 10/15/2018 CLINICAL DATA:  Follow-up intracranial hemorrhage EXAM: CT ANGIOGRAPHY HEAD AND NECK TECHNIQUE: Multidetector CT imaging of the head and neck was performed using the standard protocol during bolus administration of intravenous contrast. Multiplanar CT image reconstructions and MIPs were obtained to evaluate the vascular anatomy. Carotid stenosis measurements (when applicable) are obtained utilizing NASCET criteria, using the distal internal carotid diameter as the denominator. CONTRAST:  75mL OMNIPAQUE IOHEXOL 350 MG/ML SOLN COMPARISON:  Head CT from 2 days ago FINDINGS: CT HEAD FINDINGS Brain: Unchanged left posterior frontal and parietal parenchymal hemorrhage with rim of edema. The shape and extent is unchanged. Decreasing density of intraventricular clot, although still extensive at the level of the lateral ventricles. Right frontal ventriculostomy  with tip near the foramina [place]. Stable ventricular volume. Midline shift measures 6 mm from left to right, essentially stable. Small volume subarachnoid hemorrhage mainly along the right cerebral convexity, stable. Negative for interval infarct. Vascular: See below Skull:  Negative Sinuses: Intubation with nasopharyngeal fluid/debris. Essentially stable right maxillary and ethmoid sinus opacification Orbits: Remote orbital rim repair on the left.  No acute finding Review of the MIP images confirms the above findings CTA NECK FINDINGS Aortic arch: Normal.  Three vessel branching Right carotid system: No flow limiting stenosis or plaque ulceration. Tortuous distal ICA with pseudoaneurysm at the skull base measuring 8 mm in diameter. Aneurysm projects posteriorly and is smoothly contoured. Left carotid system: Atherosclerotic plaque without ulceration or flow limiting stenosis at the ICA bulb. Vertebral arteries: No proximal subclavian stenosis or ulceration. Both vertebral arteries are smooth and widely patent Skeleton: No acute or aggressive finding Other neck: Intubation with unremarkable tube positioning Upper chest: Minimally visualized opacity in the dependent left lung, likely atelectasis Review of the MIP images confirms the above findings CTA HEAD FINDINGS Anterior circulation: Atherosclerotic plaque on the carotid siphons with mild narrowing at the left paraclinoid ICA. No evidence of underlying vascular malformation or aneurysm at the level of lobar hemorrhage. There is a 2.7 mm aneurysm or infundibulum projecting medially and posteriorly at the right carotid terminus. No vessel beading or major branch occlusion Posterior circulation: The vertebral and basilar arteries are smooth and diffusely patent atheromatous irregularity of the posterior cerebral arteries with mild borderline moderate narrowing at the right P2 segment. Venous sinuses: Patent as permitted by contrast timing Anatomic variants: None significant Delayed phase: Not obtained Review of the MIP images confirms the above findings IMPRESSION: 1. No visible explanation for lobar hemorrhage. 2. 3 mm aneurysm or infundibulum at the right carotid terminus. 3. 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 4. Atherosclerosis without flow limiting stenosis of major vessels. 5. Stable shunted ventricular volume.  No repeat hemorrhage. Electronically Signed   By: Marnee SpringJonathon  Watts M.D.   On: 10/15/2018 08:09   Dg Chest Port 1 View  Result Date: 10/14/2018 CLINICAL DATA:  Respiratory failure. EXAM: PORTABLE CHEST 1 VIEW COMPARISON:  October 11, 2018 FINDINGS: Endotracheal tube in satisfactory position. Enteric catheter transverses the thorax. Cardiomediastinal silhouette is normal. Mediastinal contours appear intact. There is no evidence of pneumothorax. Low lung volumes with bibasilar atelectasis versus airspace consolidation. Osseous structures are without acute abnormality. Soft tissues are grossly normal. IMPRESSION: 1. Low lung volumes with bibasilar atelectasis versus airspace consolidation. 2. Support apparatus as described. Electronically Signed   By: Ted Mcalpineobrinka  Dimitrova M.D.   On: 10/14/2018 09:02   [place] Ekg Site Rite  Result Date: 10/13/2018 If Site Rite image not attached, placement could not be confirmed due to current cardiac rhythm.   Assessment/Plan: Again little change from a neurosurgical perspective, but again CT shows further improvement and CSF drainage is becoming less bloody.  We will begin weaning the IVC, by raising to 15 cm of water now and 20 cm of water at 2000.  If this is well-tolerated, we will be able to clamp the IVC tomorrow, and check a follow-up CT of the brain without contrast the following morning.  If the ventricles remain stable, will then be able to DC the IVC.  Stroke and intensive care continues per CCM and stroke neurology service.  Hewitt ShortsNUDELMAN,ROBERT W, MD 10/15/2018, 8:48 AM

## 2018-10-16 DIAGNOSIS — K7211 Chronic hepatic failure with coma: Secondary | ICD-10-CM

## 2018-10-16 LAB — COMPREHENSIVE METABOLIC PANEL
ALT: 28 U/L (ref 0–44)
AST: 41 U/L (ref 15–41)
Albumin: 2.4 g/dL — ABNORMAL LOW (ref 3.5–5.0)
Alkaline Phosphatase: 115 U/L (ref 38–126)
Anion gap: 7 (ref 5–15)
BUN: 33 mg/dL — ABNORMAL HIGH (ref 6–20)
CO2: 23 mmol/L (ref 22–32)
Calcium: 8.8 mg/dL — ABNORMAL LOW (ref 8.9–10.3)
Chloride: 127 mmol/L — ABNORMAL HIGH (ref 98–111)
Creatinine, Ser: 1.04 mg/dL (ref 0.61–1.24)
GFR calc Af Amer: >60 mL/min (ref >60)
GFR calc non Af Amer: >60 mL/min (ref >60)
Glucose, Bld: 117 mg/dL — ABNORMAL HIGH (ref 70–99)
Potassium: 3.3 mmol/L — ABNORMAL LOW (ref 3.5–5.1)
Sodium: 157 mmol/L — ABNORMAL HIGH (ref 135–145)
Total Bilirubin: 2.6 mg/dL — ABNORMAL HIGH (ref 0.3–1.2)
Total Protein: 6.5 g/dL (ref 6.5–8.1)

## 2018-10-16 LAB — AMMONIA: Ammonia: 156 umol/L — ABNORMAL HIGH (ref 9–35)

## 2018-10-16 LAB — GLUCOSE, CAPILLARY
Glucose-Capillary: 104 mg/dL — ABNORMAL HIGH (ref 70–99)
Glucose-Capillary: 116 mg/dL — ABNORMAL HIGH (ref 70–99)
Glucose-Capillary: 118 mg/dL — ABNORMAL HIGH (ref 70–99)
Glucose-Capillary: 132 mg/dL — ABNORMAL HIGH (ref 70–99)
Glucose-Capillary: 136 mg/dL — ABNORMAL HIGH (ref 70–99)
Glucose-Capillary: 96 mg/dL (ref 70–99)

## 2018-10-16 LAB — POCT I-STAT 7, (LYTES, BLD GAS, ICA,H+H)
Acid-Base Excess: 1 mmol/L (ref 0.0–2.0)
Bicarbonate: 23.1 mmol/L (ref 20.0–28.0)
Calcium, Ion: 1.26 mmol/L (ref 1.15–1.40)
HCT: 34 % — ABNORMAL LOW (ref 39.0–52.0)
Hemoglobin: 11.6 g/dL — ABNORMAL LOW (ref 13.0–17.0)
O2 Saturation: 97 %
Patient temperature: 101.2
Potassium: 3.3 mmol/L — ABNORMAL LOW (ref 3.5–5.1)
Sodium: 160 mmol/L — ABNORMAL HIGH (ref 135–145)
TCO2: 24 mmol/L (ref 22–32)
pCO2 arterial: 30.2 mmHg — ABNORMAL LOW (ref 32.0–48.0)
pH, Arterial: 7.496 — ABNORMAL HIGH (ref 7.350–7.450)
pO2, Arterial: 88 mmHg (ref 83.0–108.0)

## 2018-10-16 LAB — CBC
HCT: 34.9 % — ABNORMAL LOW (ref 39.0–52.0)
Hemoglobin: 11.6 g/dL — ABNORMAL LOW (ref 13.0–17.0)
MCH: 37.5 pg — ABNORMAL HIGH (ref 26.0–34.0)
MCHC: 33.2 g/dL (ref 30.0–36.0)
MCV: 112.9 fL — ABNORMAL HIGH (ref 80.0–100.0)
Platelets: 94 10*3/uL — ABNORMAL LOW (ref 150–400)
RBC: 3.09 MIL/uL — ABNORMAL LOW (ref 4.22–5.81)
RDW: 14.7 % (ref 11.5–15.5)
WBC: 17 10*3/uL — ABNORMAL HIGH (ref 4.0–10.5)
nRBC: 0.6 % — ABNORMAL HIGH (ref 0.0–0.2)

## 2018-10-16 LAB — SODIUM
Sodium: 155 mmol/L — ABNORMAL HIGH (ref 135–145)
Sodium: 159 mmol/L — ABNORMAL HIGH (ref 135–145)
Sodium: 157 mmol/L — ABNORMAL HIGH (ref 135–145)

## 2018-10-16 LAB — PROTIME-INR
INR: 1.8 — ABNORMAL HIGH (ref 0.8–1.2)
Prothrombin Time: 20.7 seconds — ABNORMAL HIGH (ref 11.4–15.2)

## 2018-10-16 LAB — ECHOCARDIOGRAM COMPLETE
Height: 74 in
Weight: 3887.15 oz

## 2018-10-16 LAB — MAGNESIUM: Magnesium: 2.4 mg/dL (ref 1.7–2.4)

## 2018-10-16 LAB — TRIGLYCERIDES: Triglycerides: 206 mg/dL — ABNORMAL HIGH (ref <150)

## 2018-10-16 MED ORDER — TAMSULOSIN HCL 0.4 MG PO CAPS
0.4000 mg | ORAL_CAPSULE | Freq: Every day | ORAL | Status: DC
  Administered 2018-10-17: 0.4 mg via ORAL
  Filled 2018-10-16 (×3): qty 1

## 2018-10-16 MED ORDER — SODIUM CHLORIDE 0.9 % IV SOLN
2.0000 g | INTRAVENOUS | Status: DC
  Administered 2018-10-16 – 2018-10-19 (×4): 2 g via INTRAVENOUS
  Filled 2018-10-16 (×3): qty 2
  Filled 2018-10-16: qty 20
  Filled 2018-10-16: qty 2

## 2018-10-16 MED ORDER — SODIUM CHLORIDE 0.45 % IV SOLN
INTRAVENOUS | Status: DC
  Administered 2018-10-16 – 2018-10-19 (×2): via INTRAVENOUS

## 2018-10-16 MED ORDER — QUETIAPINE FUMARATE 25 MG PO TABS
50.0000 mg | ORAL_TABLET | Freq: Two times a day (BID) | ORAL | Status: DC
  Filled 2018-10-16: qty 2

## 2018-10-16 MED ORDER — QUETIAPINE FUMARATE 25 MG PO TABS
50.0000 mg | ORAL_TABLET | Freq: Two times a day (BID) | ORAL | Status: DC
  Administered 2018-10-16 – 2018-10-19 (×7): 50 mg
  Filled 2018-10-16 (×6): qty 2

## 2018-10-16 MED ORDER — DEXMEDETOMIDINE HCL IN NACL 400 MCG/100ML IV SOLN
0.4000 ug/kg/h | INTRAVENOUS | Status: DC
  Administered 2018-10-16: 1 ug/kg/h via INTRAVENOUS
  Administered 2018-10-16: 0.8 ug/kg/h via INTRAVENOUS
  Administered 2018-10-16: 0.4 ug/kg/h via INTRAVENOUS
  Administered 2018-10-16 – 2018-10-17 (×3): 0.6 ug/kg/h via INTRAVENOUS
  Administered 2018-10-17: 21:00:00 1.1 ug/kg/h via INTRAVENOUS
  Administered 2018-10-17: 0.6 ug/kg/h via INTRAVENOUS
  Administered 2018-10-18: 1.2 ug/kg/h via INTRAVENOUS
  Administered 2018-10-18 (×2): 1 ug/kg/h via INTRAVENOUS
  Administered 2018-10-18: 1.2 ug/kg/h via INTRAVENOUS
  Administered 2018-10-18: 0.8 ug/kg/h via INTRAVENOUS
  Administered 2018-10-18: 1 ug/kg/h via INTRAVENOUS
  Administered 2018-10-18: 1.2 ug/kg/h via INTRAVENOUS
  Administered 2018-10-19: 0.8 ug/kg/h via INTRAVENOUS
  Filled 2018-10-16 (×3): qty 200
  Filled 2018-10-16: qty 300
  Filled 2018-10-16: qty 200
  Filled 2018-10-16 (×5): qty 100

## 2018-10-16 MED ORDER — PHYTONADIONE 1 MG/0.5 ML ORAL SOLUTION
1.0000 mg | Freq: Once | ORAL | Status: AC
  Administered 2018-10-16: 1 mg via ORAL
  Filled 2018-10-16: qty 0.5

## 2018-10-16 NOTE — Progress Notes (Signed)
Assisted tele visit to patient with daughter.  Christian Esbenshade Parker, RN  

## 2018-10-16 NOTE — Progress Notes (Signed)
Subjective: Patient continues on ventilator via ETT.  Sedated with Precedex.  IVC raised initially to 15 cmH2O yesterday morning and to 20 cm of water last night.  IVC continues to drain pinkish xanthochromic CSF.  Clamping of IVC requested for 0800 this morning, but held until I made rounds.  Objective: Vital signs in last 24 hours: Vitals:   10/16/18 1000 10/16/18 1100 10/16/18 1120 10/16/18 1200  BP: 137/74 129/69 129/69 125/62  Pulse: 89 86 85 76  Resp: (!) 27 (!) 29 (!) 24 (!) 28  Temp:    (!) 101.2 F (38.4 C)  TempSrc:    Axillary  SpO2: 95% 95% 98% 95%  Weight:      Height:        Intake/Output from previous day: 07/14 0701 - 07/15 0700 In: 2048.9 [I.V.:788.6; NG/GT:960; IV Piggyback:300.2] Out: 1681 [Urine:1575; Drains:106] Intake/Output this shift: Total I/O In: 403.8 [I.V.:103.8; NG/GT:200; IV Piggyback:100] Out: 354 [Urine:345; Emesis/NG output:3; Drains:6]  Physical Exam: Not opening eyes to voice or pain.  Not following commands.  Minimal movement of extremities to painful stimulation.  CBC Recent Labs    10/15/18 0551  10/16/18 0536 10/16/18 1246  WBC 13.2*  --  17.0*  --   HGB 11.1*   < > 11.6* 11.6*  HCT 32.8*   < > 34.9* 34.0*  PLT 89*  --  94*  --    < > = values in this interval not displayed.   BMET Recent Labs    10/15/18 0551  10/16/18 0536 10/16/18 0759 10/16/18 1246  NA 155*   < > 157* 155* 160*  K 3.3*   < > 3.3*  --  3.3*  CL 125*  --  127*  --   --   CO2 22  --  23  --   --   GLUCOSE 110*  --  117*  --   --   BUN 30*  --  33*  --   --   CREATININE 0.91  --  1.04  --   --   CALCIUM 9.0  --  8.8*  --   --    < > = values in this interval not displayed.    Assessment/Plan: Have asked nursing staff to go ahead and clamp IVC.  Will check CT brain without contrast at 0500 tomorrow morning.  If ventricular size is stable, we will plan on discontinuing IVC.  Critical care and stroke care per CCM and stroke neurology services.  Case and  plans discussed with Dr. Erlinda Hong (stroke neurology service).  Hosie Spangle, MD 10/16/2018, 1:30 PM

## 2018-10-16 NOTE — Progress Notes (Signed)
STROKE TEAM PROGRESS NOTE   SUBJECTIVE (INTERVAL HISTORY) Pt continue to be intubated, on sedation, switching from propofol to precedex. Not responsive. No significant neuro change from yesterday. INR 1.8 and ammonia 156. EVD patent and now clamped. CT repeat in am.    OBJECTIVE Vitals:   10/16/18 0800 10/16/18 0806 10/16/18 0900 10/16/18 1000  BP: 139/75 136/75 (!) 144/78 137/74  Pulse: 91 89 92 89  Resp: (!) 33 (!) 30 (!) 32 (!) 27  Temp: (!) 101 F (38.3 C)     TempSrc: Axillary     SpO2: 95% 98% 95% 95%  Weight:      Height:        CBC:  Recent Labs  Lab Feb 09, 2019 2048  Feb 09, 2019 2319  10/15/18 0551 10/15/18 1042 10/16/18 0536  WBC 9.0  --  9.7   < > 13.2*  --  17.0*  NEUTROABS 5.8  --  8.1*  --   --   --   --   HGB 12.7*   < > 12.9*   < > 11.1* 10.2* 11.6*  HCT 35.7*   < > 37.6*   < > 32.8* 30.0* 34.9*  MCV 104.4*  --  110.3*   < > 110.4*  --  112.9*  PLT 128*  --  114*   < > 89*  --  94*   < > = values in this interval not displayed.    Basic Metabolic Panel:  Recent Labs  Lab 10/13/18 1849  10/15/18 0551  10/15/18 1042  10/16/18 0536 10/16/18 0759  NA 154*   < > 155*   < > 160*   < > 157* 155*  K  --    < > 3.3*  --  3.5  --  3.3*  --   CL  --    < > 125*  --   --   --  127*  --   CO2  --    < > 22  --   --   --  23  --   GLUCOSE  --    < > 110*  --   --   --  117*  --   BUN  --    < > 30*  --   --   --  33*  --   CREATININE  --    < > 0.91  --   --   --  1.04  --   CALCIUM  --    < > 9.0  --   --   --  8.8*  --   MG 2.0  --  2.2  --   --   --  2.4  --   PHOS 2.0*  --  2.3*  --   --   --   --   --    < > = values in this interval not displayed.    Lipid Panel:     Component Value Date/Time   CHOL 144 10/14/2018 0200   TRIG 41 10/14/2018 0200   HDL 66 10/14/2018 0200   CHOLHDL 2.2 10/14/2018 0200   VLDL 8 10/14/2018 0200   LDLCALC 70 10/14/2018 0200   HgbA1c:  Lab Results  Component Value Date   HGBA1C 5.0 Nov 08, 202020   Urine Drug Screen:      Component Value Date/Time   LABOPIA NONE DETECTED Nov 08, 202020 2117   COCAINSCRNUR NONE DETECTED Nov 08, 202020 2117   COCAINSCRNUR NEG 03/17/2014 1456   LABBENZ POSITIVE (A)  July 06, 2018 2117   AMPHETMU POSITIVE (A) July 06, 2018 2117   THCU NONE DETECTED July 06, 2018 2117   LABBARB NONE DETECTED July 06, 2018 2117    Alcohol Level     Component Value Date/Time   ETH <10 July 06, 2018 2048    IMAGING Ct Head Wo Contrast  10/17/2018 pending   Ct Angio Head W Or Wo Contrast Ct Angio Neck W Or Wo Contrast 10/15/2018 1. No visible explanation for lobar hemorrhage. 2. 3 mm aneurysm or infundibulum at the right carotid terminus. 3. 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 4. Atherosclerosis without flow limiting stenosis of major vessels. 5. Stable shunted ventricular volume.  No repeat hemorrhage.    Ct Head Wo Contrast  10/13/2018 1. Expected evolution of large left posterior frontal and parietal intraparenchymal hemorrhage. 2. Decompression of the ventricles following ventriculostomy tube placement. 3. Blood products previously seen in the fourth ventricle have cleared. 4. No new hemorrhage. Ct Head Wo Contrast  10/12/2018 1. No significant interval change in size and morphology of large intraparenchymal hematoma centered at the left frontoparietal region. Associated regional mass effect with 12 mm of left-to-right midline shift, relatively similar.  2. Intraventricular extension with large volume intraventricular hemorrhage, increased from previous exam. Slightly worsened associated hydrocephalus.  3. No other new acute intracranial abnormality.   Ct Head Code Stroke Wo Contrast 10-30-18 Large left frontal/parietal lobe junction intra-axial hemorrhage with estimated blood volume of 97 mL. Regional edema and mass effect with 11 mm of rightward midline shift. Intraventricular extension with moderate ventriculomegaly and transependymal edema.  Small volume left convexity subarachnoid hemorrhage.  Dg Chest Portable 1 View 10/14/2018 1. Low lung volumes with bibasilar atelectasis versus airspace consolidation. 2. Support apparatus as described. 10-30-18 1. The ETT terminates just above the thoracic inlet. Recommend advancing 7 cm. 21   Transthoracic Echocardiogram  10/13/2018 pending   PHYSICAL EXAM  Temp:  [98.5 F (36.9 C)-101 F (38.3 C)] 101 F (38.3 C) (07/15 0800) Pulse Rate:  [81-103] 89 (07/15 1000) Resp:  [24-34] 27 (07/15 1000) BP: (115-144)/(64-79) 137/74 (07/15 1000) SpO2:  [91 %-98 %] 95 % (07/15 1000) FiO2 (%):  [40 %-50 %] 50 % (07/15 0806) Weight:  [161[111 kg] 111 kg (07/15 0500)  General - Well nourished, well developed, intubated on sedation. Sclera jaundice. Mild respiratory distress  Ophthalmologic - fundi not visualized due to noncooperation.  Cardiovascular - Regular rate and rhythm.  Neuro - intubated on sedation, eyes closed, not following commands. With forced eye opening, eyes in mid position, not blinking to visual threat, doll's eyes present, not tracking, pupils 2mm, not reactive. Corneal reflex absent bilaterally, gag and cough present. Breathing over the vent.  Facial symmetry not able to test due to ET tube.  Tongue protrusion not cooperative. On pain stimulation, slight withdraw on all extremities. Bilateral positive babinski. Sensation, coordination and gait not tested.   ASSESSMENT/PLAN [name] is a 58 y.o. male with history of  alcohol abuse, liver cirrhosis, hep C, Bipolar, hypertension including portal hypertension [facility] after being found unresponsive by his wife. He did not receive IV t-PA due to hemorrhage. NS consult -> risks of surgery outweighed benefits.  ICH - Large left parietal ICH and IVH with midline shift - could be HTN in the setting of coagulopathy and thrombocytopenia due to cirrhosis   CT head 2018-07-18 - Large left  frontal/parietal lobe junction intra-axial hemorrhage with regional edema and mass effect.  Intraventricular extension with moderate ventriculomegaly and transependymal edema. Small volume left convexity subarachnoid hemorrhage.  CT Head 10/12/18 - No significant interval change of hematoma and MLS. Intraventricular extension with large volume intraventricular hemorrhage, increased from previous exam. Slightly worsened associated hydrocephalus.   CT head 10/13/2018 - decreased IVH and stable hematoma  CTA H&N 10/15/18 neg for source of ICH  CT head in am  2D Echo - pending  Christian Sanders Corona Virus 2  - negative  LDL - 70  HgbA1c - 5.0  UDS - positive for amphetamines and benzodiazepines (Pt is on Xanax )  VTE prophylaxis - SCDs  No antithrombotic prior to admission, now on No antithrombotic due to hemorrhage  Therapy recommendations:  pending  Disposition:  Pending  Hydrocephalus  CT repeat 10/12/2018 increased ventriculomegaly and hydrocephalus  Status post EVD  Neurosurgery on board  CT repeat 10/13/2018 decrease IVH  CT head 7/14 stable shunted volume  On Keppra 500 twice daily  on ancef 7/13 -> Rocephin 7/15  Increased pop off to 20 last night   Currently EVD clamped today  CT head repeat in am  Cerebral edema  CT head showed mass-effect with midline shift  off 3% saline -> now on free water  Sodium 154-157-160-156-157->155->159  Sodium goal 150-155  Sodium every 6 hours  On 1/2 NS @ 50  CT head 10/13/2018 improved midline shift down from 12->6 mm  CT head 7/14 stable shunted volume  CT head repeat in am  Coagulopathy  Hyperammonemia  Alcoholic Liver cirrhosis Vitamin K Deficiency Hepatitis C decompensated liver failure  INR 1.4 s/p FFP -> 1.7 s/p FFP ->1.8 FFP and Vit K->2.2->1.8  INR goal < 1.5  Ammonia level 55-> 98->103->156, on lactulose    Alb 3.0, AST 60, TBIL 3.8, DBIL 0.8, IBILI 3.0  Thrombocytopenia  128->114->102->110->89->94  Monitor INR and ammonia level daily  History of varices  History of hepatitis C  History of portal hypertension  History of alcohol abuse  On rifampin  CCM on board  Fever  T-max 101.8->101.1->100.8->99.9->101.2  WBC 9.0->9.7->20.6->20.2->13.2->17.0  on ancef 7/13 -> Rocephin 7/15  CCM on board  Respiratory Cx abundant streptococcus pneumoniae  CXR bibasilar atx  UA pending  Acute Respiratory Failure in setting of ICH Possible aspiration  CCM on board  Still on vent with sedation  Sedation switch from propofol to precedex  Also on seroquel 50mg  bid  Hypertension  Stable on the lower end . BP goal < 160 mm Hg . Treated with Cleviprex now off . Not on BP meds . Long-term BP goal normotensive  Dysphagia . Secondary to stroke . NPO . On tube feedings @ 40cc/h  Tobacco abuse  Current smoker  Smoking cessation counseling will be provided  Other Stroke Risk Factors  History of ETOH abuse  Obesity, Body mass index is 31.42 kg/m., recommend weight loss, diet and exercise as appropriate   Substance abuse -UDS positive amphetamine  Other Active Problems  Mild anemia due to chronic disease- Hb - 11.1->11.7->11.1->10.2->11.6 (macrocytic indices)   Thrombocytopenia due to liver cirrhosis- 128->114->102->110->89->94  Hypokalemia 3.0 - replaced - 3.3 replaced -> 3.5->3.3  Hospital day # 5  This patient is critically ill due to ICH, IVH and cerebral edema, liver cirrhosis, coagulopathy and hyperammonemia and at significant risk of neurological worsening, death form hematoma expansion, hydrocephalus, seizure, CNS infection, bleeding, encephalopathy. This patient's care requires constant monitoring of vital signs, hemodynamics, respiratory and cardiac monitoring, review of multiple databases, neurological assessment, discussion with family, other  specialists and medical decision making of high complexity. I spent 35 minutes of  neurocritical care time in the care of this patient.  I discussed with CCM Dr. Lynetta Mare.  Rosalin Hawking, MD PhD Stroke Neurology 10/16/2018 10:35 AM    To contact Stroke Continuity provider, please refer to [email or web address] After hours, contact General Neurology

## 2018-10-16 NOTE — Progress Notes (Signed)
NAME:  Christian Sanders, MRN:  [id number], DOB:  05/12/1960, LOS: 5 ADMISSION DATLawerance Cruel:  10/27/2018, CONSULTATION DATE:  10/23/2018 REFERRING MD:  [name], CHIEF COMPLAINT:  ICH  Brief History   58 yo M found to have large L parietal hemorrage with intraventricular extension. [facility] UDS + for amphetamines / benzo's.  FFP, Vit K given.  To OR 7/11 for placement of right frontal intraventricular catheter.   Past Medical History  EtOH abuse Alcoholic Cirrhosis Hep C GERD GI Bleed Gastritis  BPD Panic Disorder PTSD Anxiety Depression Coagulopathy HTN Portal HTN BPH Chronic Pain   Significant Hospital Events   [facility]   Consults:  NSGY Neuro PCCM  Procedures:  ETT 7/10 >>  R lateral ventricle EVD 7/10>>  Significant Diagnostic Tests:  7/10 CT Head non-con> Large L posterior frontal/parietal lobe intra-axial hemorrhage measuring 54x57x6763mm, with associated edema and mass effect. Intraventricular extension with ventriculomegaly. Small L SAH. No SDH. Some Ventriculomegaly with edema at temporal and occipital horns. 11mm rightward midline shift. No uncal herniation. Partial effacement of suprasellar cistern. Blood present in 4th ventricle.  CXR 7/10 >> no acute cardiopulmonary abnormality. ETT at thoracic inlet and 7cm advancement is advised  CT Head (prior to EVD) 7/11 >> redemonstrated  L parietal bleed but now with extensive intraventricular extension and ventricular casting (personally reviewed) CT Head 7/12 >> EVD in good position in lateral ventricle. Ventricles appear decompressed and size of hemorrhage has diminished. 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  Stable shunted ventricular volume.    Micro Data:  7/10 SARS CoV2 >  negative  Antimicrobials:  Cefazolin 7/13 >>  Interim history/subjective:  Remains sedated with significant tachypnea. Not following commands with decreased spontaneous movement on left side. EVD raised today.    Objective   Blood pressure 129/69, pulse 85, temperature (!) 101 F (38.3 C), temperature source Axillary, resp. rate (!) 24, height 6\' 2"  (1.88 m), weight 111 kg, SpO2 98 %.    Vent Mode: PRVC FiO2 (%):  [40 %-50 %] 50 % Set Rate:  [12 bmp] 12 bmp Vt Set:  [660 mL] 660 mL PEEP:  [5 cmH20] 5 cmH20 Plateau Pressure:  [17 cmH20] 17 cmH20   Intake/Output Summary (Last 24 hours) at 10/16/2018 1145 Last data filed at 10/16/2018 1100 Gross per 24 hour  Intake 1994.31 ml  Output 1367 ml  Net 627.31 ml   Filed Weights   10/14/18 0500 10/15/18 0200 10/16/18 0500  Weight: 110.5 kg 111.3 kg 111 kg    Examination: General: ill appearing adult male lying in bed on vent in NAD HEENT: MM pink/moist, ETT, scleral icterus, EVD in place on R at 15 cm H2O with pink/bloody drainage Neuro: sedated on vent, PERL, no response to painful stimulation. CV: s1s2 rrr, no m/r/g PULM: tachypnea with increased accessory muscle use. GI: soft, round, bsx4 active  Extremities: warm/dry, trace generalized edema  Skin: no rashes or lesions.  Jaundiced  Resolved Hospital Problem list    Assessment & Plan:   Critically ill due to acute Respiratory Failure requiring mechanical ventilation in setting of ICH Persistent tachypnea, without change based on ventilator support.  Likely centrally mediated. - ABG to rule out mixed acid-base disorder.   Large L parietal ICH with Intraventricular  Extension  Maintaining BP goals spontaneously. Amphetamines may have provoked BP surge leading to hemorrhage. -ICH score 4 predicts poor 30 day prognosis, although age suggests better prognosis.  -s/p 3% NS - increase EVD to 15cm H2O, then rise to 20 at 8pm, follow up CT imagine on 7/16 - may require  tracheostomy if no improvement in mental status.   Critically ill due to acute encephalopathy requiring titration of sedatives. On lactulose and rifaximin for possible HE. No improvement on propofol. - Trial of dexmedetomidine/quetiapine based strategy -Withdrawal states less likely at this time. -Decrease neurocheck frequency as examination has remained stable   Fever due to possible aspiration vs hemorrhage., Bi-basilar infiltrates consistent with aspiration. Complete 7 days of antibiotics for S. pneumoniae  Alcoholic Cirrhosis Coagulopathy   Hx Hep C Hyperammonemia  P: Follow LFT's, INR Lactulose 30 mg TID  Continue Rifaximin  Caution with acetaminophen use given cirrhosis  No evidence for bleeding, no indication for transfusion at this time.  Avoid hepatotoxic agents   Hyperglycemia P: SSI   Leukocytosis  -likely reactive/chemical meningitis from blood in ventricles.  P: Monitor WBC / fever curve  Continue cefazolin  -did not receive surgical prophylaxis for EVD -pharmacy consult for cefazolin   Best practice:  Diet: NPO - tube feeds per recs Pain/Anxiety/Delirium protocol (if indicated): prn fentanyl, propofol   VAP protocol (if indicated): yes  DVT prophylaxis: SCD GI prophylaxis: protonix  Glucose control: SSI  Mobility: BR  Code Status: FULL  Family Communication: left message on daughter's voicemail-Jessica Mosso.  Disposition: continue ICU   Labs   CBC: Recent Labs  Lab 28-Oct-2018 2048  2018/10/28 2319  10/13/18 0342 10/14/18 0200 10/15/18 0551 10/15/18 1042 10/16/18 0536  WBC 9.0  --  9.7  --  20.6* 20.2* 13.2*  --  17.0*  NEUTROABS 5.8  --  8.1*  --   --   --   --   --   --   HGB 12.7*   < > 12.9*   < > 11.1* 11.7* 11.1* 10.2* 11.6*  HCT 35.7*   < > 37.6*   < > 31.8* 33.6* 32.8* 30.0* 34.9*  MCV 104.4*  --  110.3*  --  107.8* 108.0* 110.4*  --  112.9*  PLT 128*  --  114*  --  102* 110* 89*  --  94*   < > = values in this interval not  displayed.    Basic Metabolic Panel: Recent Labs  Lab 28-Oct-2018 2048 Oct 28, 2018 2105  10/12/18 0938 10/12/18 1618  10/13/18 0342 10/13/18 1849 10/14/18 0200  10/15/18 0551  10/15/18 1042 10/15/18 1600 10/15/18 2119 10/16/18 0536 10/16/18 0759  NA 137 138   < > 139 142   < > 148*   146* 154* 153*   < > 155*   < > 160* 158* 156* 157* 155*  K 3.7 3.6   < >  --   --   --  3.6  --  3.0*  --  3.3*  --  3.5  --   --  3.3*  --   CL 107 109  --   --   --   --  121*  --  124*  --  125*  --   --   --   --  127*  --   CO2 19*  --   --   --   --   --  19*  --  20*  --  22  --   --   --   --  23  --   GLUCOSE 130* 133*  --   --   --   --  139*  --  153*  --  110*  --   --   --   --  117*  --   BUN 11 9  --   --   --   --  15  --  21*  --  30*  --   --   --   --  33*  --   CREATININE 0.81 0.70  --   --   --   --  0.74  --  0.83  --  0.91  --   --   --   --  1.04  --   CALCIUM 9.1  --   --   --   --   --  8.8*  --  9.2  --  9.0  --   --   --   --  8.8*  --   MG  --   --    < > 1.9 1.9  --  2.0 2.0  --   --  2.2  --   --   --   --  2.4  --   PHOS  --   --   --  2.9 2.4*  --  1.6* 2.0*  --   --  2.3*  --   --   --   --   --   --    < > = values in this interval not displayed.   GFR: Estimated Creatinine Clearance: 103.9 mL/min (by C-G formula based on SCr of 1.04 mg/dL). Recent Labs  Lab 09-16-2018 2056  10/13/18 0342 10/14/18 0200 10/15/18 0551 10/16/18 0536  WBC  --    < > 20.6* 20.2* 13.2* 17.0*  LATICACIDVEN 3.2*  --   --   --   --   --    < > = values in this interval not displayed.    Liver Function Tests: Recent Labs  Lab 09-16-2018 2048 10/14/18 0200 10/15/18 0551 10/16/18 0536  AST 49* 60* 39 41  ALT 32 38 30 28  ALKPHOS 180* 124 102 115  BILITOT 1.8* 3.8* 3.4* 2.6*  PROT 7.8 6.8 6.3* 6.5  ALBUMIN 3.4* 3.0* 2.5* 2.4*   No results for input(s): LIPASE, AMYLASE in the last 168 hours. Recent Labs  Lab 09-16-2018 2048 10/13/18 0342 10/14/18 0200 10/15/18 1000 10/16/18 0536   AMMONIA 55* 98* 103* 103* 156*    ABG    Component Value Date/Time   PHART 7.538 (H) 10/15/2018 1042   PCO2ART 27.4 (L) 10/15/2018 1042   PO2ART 62.0 (L) 10/15/2018 1042   HCO3 23.4 10/15/2018 1042   TCO2 24 10/15/2018 1042   ACIDBASEDEF 4.0 (H) 06-15-202020 2327   O2SAT 95.0 10/15/2018 1042     Coagulation Profile: Recent Labs  Lab 09-16-2018 2048 10/13/18 0342 10/14/18 0200 10/15/18 0551 10/16/18 0536  INR 1.4* 1.7* 1.8* 2.2* 1.8*    Cardiac Enzymes: No results for input(s): CKTOTAL, CKMB, CKMBINDEX, TROPONINI in the last 168 hours.  HbA1C: Hgb A1c MFr Bld  Date/Time Value Ref Range Status  06-15-202020 11:19 PM 5.0 4.8 - 5.6 % Final    Comment:    (NOTE) Pre diabetes:          5.7%-6.4% Diabetes:              >6.4% Glycemic control for   <7.0% adults with diabetes  CBG: Recent Labs  Lab 10/15/18 2004 10/15/18 2335 10/16/18 0336 10/16/18 0744 10/16/18 1128  GLUCAP 95 104* 116* 104* 96    CRITICAL CARE Performed by: Lynnell Catalanavi [place]   Total critical care time: 35 minutes  Critical care time was exclusive of separately billable procedures and treating other patients.  Critical care was necessary to treat or prevent imminent or life-threatening deterioration.  Critical care was time spent personally by me on the following activities: development of treatment plan with patient and/or surrogate as well as nursing, discussions with consultants, evaluation of patient's response to treatment, examination of patient, obtaining history from patient or surrogate, ordering and performing treatments and interventions, ordering and review of laboratory studies, ordering and review of radiographic studies, pulse oximetry, re-evaluation of patient's condition and participation in multidisciplinary rounds.  Lynnell Catalanavi Talene Glastetter, MD [facility] ICU Physician [facility] [place] [facility]  Pager: [phone] Mobile: [phone] After hours: [phone].   10/16/2018, 11:45  AM

## 2018-10-17 ENCOUNTER — Inpatient Hospital Stay (HOSPITAL_COMMUNITY): Payer: Medicare Other

## 2018-10-17 DIAGNOSIS — K7031 Alcoholic cirrhosis of liver with ascites: Secondary | ICD-10-CM

## 2018-10-17 LAB — CBC
HCT: 34 % — ABNORMAL LOW (ref 39.0–52.0)
Hemoglobin: 11.1 g/dL — ABNORMAL LOW (ref 13.0–17.0)
MCH: 37.1 pg — ABNORMAL HIGH (ref 26.0–34.0)
MCHC: 32.6 g/dL (ref 30.0–36.0)
MCV: 113.7 fL — ABNORMAL HIGH (ref 80.0–100.0)
Platelets: 74 10*3/uL — ABNORMAL LOW (ref 150–400)
RBC: 2.99 MIL/uL — ABNORMAL LOW (ref 4.22–5.81)
RDW: 14.5 % (ref 11.5–15.5)
WBC: 10.4 10*3/uL (ref 4.0–10.5)
nRBC: 0.5 % — ABNORMAL HIGH (ref 0.0–0.2)

## 2018-10-17 LAB — COMPREHENSIVE METABOLIC PANEL
ALT: 22 U/L (ref 0–44)
AST: 39 U/L (ref 15–41)
Albumin: 2.2 g/dL — ABNORMAL LOW (ref 3.5–5.0)
Alkaline Phosphatase: 106 U/L (ref 38–126)
Anion gap: 7 (ref 5–15)
BUN: 39 mg/dL — ABNORMAL HIGH (ref 6–20)
CO2: 23 mmol/L (ref 22–32)
Calcium: 8.8 mg/dL — ABNORMAL LOW (ref 8.9–10.3)
Chloride: 127 mmol/L — ABNORMAL HIGH (ref 98–111)
Creatinine, Ser: 0.98 mg/dL (ref 0.61–1.24)
GFR calc Af Amer: >60 mL/min (ref >60)
GFR calc non Af Amer: >60 mL/min (ref >60)
Glucose, Bld: 129 mg/dL — ABNORMAL HIGH (ref 70–99)
Potassium: 3.5 mmol/L (ref 3.5–5.1)
Sodium: 157 mmol/L — ABNORMAL HIGH (ref 135–145)
Total Bilirubin: 2 mg/dL — ABNORMAL HIGH (ref 0.3–1.2)
Total Protein: 6 g/dL — ABNORMAL LOW (ref 6.5–8.1)

## 2018-10-17 LAB — GLUCOSE, CAPILLARY
Glucose-Capillary: 118 mg/dL — ABNORMAL HIGH (ref 70–99)
Glucose-Capillary: 116 mg/dL — ABNORMAL HIGH (ref 70–99)
Glucose-Capillary: 128 mg/dL — ABNORMAL HIGH (ref 70–99)
Glucose-Capillary: 118 mg/dL — ABNORMAL HIGH (ref 70–99)
Glucose-Capillary: 133 mg/dL — ABNORMAL HIGH (ref 70–99)
Glucose-Capillary: 129 mg/dL — ABNORMAL HIGH (ref 70–99)

## 2018-10-17 LAB — SODIUM
Sodium: 158 mmol/L — ABNORMAL HIGH (ref 135–145)
Sodium: 159 mmol/L — ABNORMAL HIGH (ref 135–145)
Sodium: 158 mmol/L — ABNORMAL HIGH (ref 135–145)

## 2018-10-17 LAB — MAGNESIUM: Magnesium: 2.7 mg/dL — ABNORMAL HIGH (ref 1.7–2.4)

## 2018-10-17 LAB — PROTIME-INR
INR: 1.7 — ABNORMAL HIGH (ref 0.8–1.2)
Prothrombin Time: 19.9 seconds — ABNORMAL HIGH (ref 11.4–15.2)

## 2018-10-17 LAB — AMMONIA: Ammonia: 189 umol/L — ABNORMAL HIGH (ref 9–35)

## 2018-10-17 MED ORDER — LACTULOSE 10 GM/15ML PO SOLN
45.0000 g | Freq: Three times a day (TID) | ORAL | Status: DC
  Administered 2018-10-17 – 2018-10-19 (×7): 45 g
  Filled 2018-10-17 (×10): qty 90

## 2018-10-17 NOTE — Progress Notes (Signed)
NAME:  Christian Sanders, MRN:  [id number], DOB:  04/04/1960, LOS: 6 ADMISSION DATE:  27-Oct-2018, CONSULTATION DATE:  27-Oct-2018 REFERRING MD:  [name], CHIEF COMPLAINT:  ICH  Brief History   58 yo M found to have large L parietal hemorrage with intraventricular extension. [facility] UDS + for amphetamines / benzo's.  FFP, Vit K given.  To OR 7/11 for placement of right frontal intraventricular catheter.   Past Medical History  EtOH abuse Alcoholic Cirrhosis Hep C GERD GI Bleed Gastritis  BPD Panic Disorder PTSD Anxiety Depression Coagulopathy HTN Portal HTN BPH Chronic Pain   Significant Hospital Events   [facility]   Consults:  NSGY Neuro PCCM  Procedures:  ETT 7/10 >>  R lateral ventricle EVD 7/10>>  Significant Diagnostic Tests:  7/10 CT Head non-con> Large L posterior frontal/parietal lobe intra-axial hemorrhage measuring 54x57x563mm, with associated edema and mass effect. Intraventricular extension with ventriculomegaly. Small L SAH. No SDH. Some Ventriculomegaly with edema at temporal and occipital horns. 11mm rightward midline shift. No uncal herniation. Partial effacement of suprasellar cistern. Blood present in 4th ventricle.  CXR 7/10 >> no acute cardiopulmonary abnormality. ETT at thoracic inlet and 7cm advancement is advised  CT Head (prior to EVD) 7/11 >> redemonstrated  L parietal bleed but now with extensive intraventricular extension and ventricular casting (personally reviewed) CT Head 7/12 >> EVD in good position in lateral ventricle. Ventricles appear decompressed and size of hemorrhage has diminished. 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  Stable shunted ventricular volume.   CT Head - Stable ventricular volumes post  clamping (personal review)  Micro Data:  7/10 SARS CoV2 > negative  Antimicrobials:  Cefazolin 7/13 >>7/15 Ceftriaxone 7/15  Interim history/subjective:  Remains sedated with need for Precedex to prevent ventilator dyssynchrony.  Still no response to painful stimuli.   Objective   Blood pressure (!) 94/59, pulse (!) 48, temperature 98.3 F (36.8 C), temperature source Axillary, resp. rate (!) 21, height 6\' 2"  (1.88 m), weight 110.5 kg, SpO2 97 %.    Vent Mode: PRVC FiO2 (%):  [50 %] 50 % Set Rate:  [12 bmp] 12 bmp Vt Set:  [660 mL] 660 mL PEEP:  [5 cmH20] 5 cmH20 Plateau Pressure:  [12 cmH20-15 cmH20] 12 cmH20   Intake/Output Summary (Last 24 hours) at 10/17/2018 0826 Last data filed at 10/17/2018 0700 Gross per 24 hour  Intake 2367.68 ml  Output 1656 ml  Net 711.68 ml   Filed Weights   10/15/18 0200 10/16/18 0500 10/17/18 0500  Weight: 111.3 kg 111 kg 110.5 kg    Examination: General: ill appearing adult male lying in bed on vent in NAD HEENT: MM pink/moist, ETT, scleral icterus, EVD clamped. Neuro: sedated on vent, PERL, no response to painful stimulation.  No asterixis CV: s1s2 rrr, no m/r/g PULM: Synchronous with ventilator on dexmedetomidine.  Chest clear to auscultation GI: soft, round, bsx4 active  Extremities: warm/dry, trace generalized edema  Skin: no rashes or lesions.  Jaundiced, diffuse spider nevi  Resolved Hospital Problem list    Assessment & Plan:   Critically ill due to acute Respiratory Failure requiring mechanical ventilation in setting of ICH Persistent tachypnea, without change based on ventilator support. . ABG shows mild respiratory alkalosis, consistent with centrally  mediated hyperventilation. -Continue respiratory support -Goals of care discussion guarding tracheostomy.   Large L parietal ICH with Intraventricular Extension  Maintaining BP goals spontaneously. Amphetamines may have provoked BP surge leading to hemorrhage. -ICH score 4  predicts poor 30 day prognosis, although age suggests better prognosis.  -s/p 3% NS - increase EVD to 15cm H2O, then rise to 20 at 8pm, follow up CT imagine on 7/16 - may require tracheostomy if no improvement in mental status.   Critically ill due to acute encephalopathy requiring titration of sedatives. On lactulose and rifaximin for possible HE. Mental status is unchanged despite modification and sedation strategy.  Fever due to possible aspiration vs hemorrhage., Bi-basilar infiltrates consistent with aspiration. Fever and leukocytosis are improving Complete 7 days of antibiotics for S. pneumoniae  Alcoholic Cirrhosis Coagulopathy   Hx Hep C Hyperammonemia is refractory, likely indicative of decompensated liver failure. Follow LFT's, INR Increase lactulose to 45 4 times daily Continue Rifaximin   Goals of care: Patient has had poor neurological recovery to this point Prognosis for acceptable functional recovery is poor from a neurological standpoint and diagnosis of cirrhosis further compounds his poor outcome Would not be a candidate for PEG tube given cirrhosis and ascites, therefore no long-term feeding option. Comfort care likely only reasonable option at this point. Will discuss with family.   Best practice:  Diet: NPO - tube feeds per recs Pain/Anxiety/Delirium protocol (if indicated): prn fentanyl, propofol   VAP protocol (if indicated): yes  DVT prophylaxis: SCD GI prophylaxis: protonix  Glucose control: SSI  Mobility: BR  Code Status: FULL  Family Communication: Spoke with daughter and son-in-law and updated them on the patient's condition.  They related that the information regarding prognosis that I am providing is different than what they had been led to believe based on video visits where they observed him moving his feet.  While initially they stated that they wanted a second opinion regarding his condition when I pointed out that his prognosis for full  recovery is poor, after I indicated that obtaining such a second opinion would be difficult given delays in transfer, they seem to acknowledge that his prognosis might not be good. Disposition: continue ICU care, have called Palliative care to assist with goals of care discussion.   Labs   CBC: Recent Labs  Lab 30-Oct-2018 2048  Oct 30, 2018 2319  10/13/18 0342 10/14/18 0200 10/15/18 0551 10/15/18 1042 10/16/18 0536 10/16/18 1246 10/17/18 0547  WBC 9.0  --  9.7  --  20.6* 20.2* 13.2*  --  17.0*  --  10.4  NEUTROABS 5.8  --  8.1*  --   --   --   --   --   --   --   --   HGB 12.7*   < > 12.9*   < > 11.1* 11.7* 11.1* 10.2* 11.6* 11.6* 11.1*  HCT 35.7*   < > 37.6*   < > 31.8* 33.6* 32.8* 30.0* 34.9* 34.0* 34.0*  MCV 104.4*  --  110.3*  --  107.8* 108.0* 110.4*  --  112.9*  --  113.7*  PLT 128*  --  114*  --  102* 110* 89*  --  94*  --  74*   < > = values in this interval not displayed.    Basic Metabolic Panel: Recent Labs  Lab 10/12/18 0938 10/12/18 1618  10/13/18 0342 10/13/18 1849 10/14/18 0200  10/15/18 0551  10/15/18 1042  10/16/18 0536 10/16/18 0759 10/16/18 1246 10/16/18 1436 10/16/18 2139  10/17/18 0547  NA 139 142   < > 148*  146* 154* 153*   < > 155*   < > 160*   < > 157* 155* 160* 159* 157* 157*  K  --   --   --  3.6  --  3.0*  --  3.3*  --  3.5  --  3.3*  --  3.3*  --   --  3.5  CL  --   --   --  121*  --  124*  --  125*  --   --   --  127*  --   --   --   --  127*  CO2  --   --   --  19*  --  20*  --  22  --   --   --  23  --   --   --   --  23  GLUCOSE  --   --   --  139*  --  153*  --  110*  --   --   --  117*  --   --   --   --  129*  BUN  --   --   --  15  --  21*  --  30*  --   --   --  33*  --   --   --   --  39*  CREATININE  --   --   --  0.74  --  0.83  --  0.91  --   --   --  1.04  --   --   --   --  0.98  CALCIUM  --   --   --  8.8*  --  9.2  --  9.0  --   --   --  8.8*  --   --   --   --  8.8*  MG 1.9 1.9  --  2.0 2.0  --   --  2.2  --   --   --  2.4  --    --   --   --  2.7*  PHOS 2.9 2.4*  --  1.6* 2.0*  --   --  2.3*  --   --   --   --   --   --   --   --   --    < > = values in this interval not displayed.   GFR: Estimated Creatinine Clearance: 110 mL/min (by C-G formula based on SCr of 0.98 mg/dL). Recent Labs  Lab 10/13/2018 2056  10/14/18 0200 10/15/18 0551 10/16/18 0536 10/17/18 0547  WBC  --    < > 20.2* 13.2* 17.0* 10.4  LATICACIDVEN 3.2*  --   --   --   --   --    < > = values in this interval not displayed.    Liver Function Tests: Recent Labs  Lab 10/24/2018 2048 10/14/18 0200 10/15/18 0551 10/16/18 0536 10/17/18 0547  AST 49* 60* 39 41 39  ALT 32 38 30 28 22   ALKPHOS 180* 124 102 115 106  BILITOT 1.8* 3.8* 3.4* 2.6* 2.0*  PROT 7.8 6.8 6.3* 6.5 6.0*  ALBUMIN 3.4* 3.0* 2.5* 2.4* 2.2*   No results for input(s): LIPASE, AMYLASE in the last 168 hours. Recent Labs  Lab 10/13/18 0342 10/14/18 0200 10/15/18 1000 10/16/18 0536 10/17/18 0547  AMMONIA 98* 103* 103* 156* 189*    ABG  Component Value Date/Time   PHART 7.496 (H) 10/16/2018 1246   PCO2ART 30.2 (L) 10/16/2018 1246   PO2ART 88.0 10/16/2018 1246   HCO3 23.1 10/16/2018 1246   TCO2 24 10/16/2018 1246   ACIDBASEDEF 4.0 (H) 10/10/2018 2327   O2SAT 97.0 10/16/2018 1246     Coagulation Profile: Recent Labs  Lab 10/13/18 0342 10/14/18 0200 10/15/18 0551 10/16/18 0536 10/17/18 0547  INR 1.7* 1.8* 2.2* 1.8* 1.7*    Cardiac Enzymes: No results for input(s): CKTOTAL, CKMB, CKMBINDEX, TROPONINI in the last 168 hours.  HbA1C: Hgb A1c MFr Bld  Date/Time Value Ref Range Status  10/17/2018 11:19 PM 5.0 4.8 - 5.6 % Final    Comment:    (NOTE) Pre diabetes:          5.7%-6.4% Diabetes:              >6.4% Glycemic control for   <7.0% adults with diabetes     CBG: Recent Labs  Lab 10/16/18 1524 10/16/18 1958 10/16/18 2350 10/17/18 0327 10/17/18 0739  GLUCAP 118* 132* 136* 118* 116*    CRITICAL CARE Performed by: Lynnell Catalanavi [place]   Total critical care time: 35 minutes   Critical care time was exclusive of separately billable procedures and treating other patients.  Critical care was necessary to treat or prevent imminent or life-threatening deterioration.  Critical care was time spent personally by me on the following activities: development of treatment plan with patient and/or surrogate as well as nursing, discussions with consultants, evaluation of patient's response to treatment, examination of patient, obtaining history from patient or surrogate, ordering and performing treatments and interventions, ordering and review of laboratory studies, ordering and review of radiographic studies, pulse oximetry, re-evaluation of patient's condition and participation in multidisciplinary rounds.  Lynnell Catalanavi Sahasra Belue, MD [facility] ICU Physician [facility]  Pager: [phone] Mobile: [phone] After hours: [phone].   10/17/2018, 8:26 AM

## 2018-10-17 NOTE — Plan of Care (Signed)
I called pt daughter Janett Billow and talked with her over the phone. She raised similar concern as she talked with Dr. Lynetta Mare. She said that she was told by RN that pt was moving left side and breathing on his own but now we are telling her that pt is not doing well. They felt shocked with the bad news. I told her that pt did move actively on the left side on Sunday, but then he had to be sedated for vent dyssynchronization. And then he had decompensated liver failure, high ammonia levels, encephalopathy all made his condition getting worse. For the last 4 days pt has not been able to move spontaneously and continued to be on ventilation support. With our talk, Janett Billow is able to understand the gravity of pt condition and she will come over to hospital along with her husband and pt girlfriend for further talk. I answered all her questions to her satisfaction.   Rosalin Hawking, MD PhD Stroke Neurology 10/17/2018 3:57 PM

## 2018-10-17 NOTE — Progress Notes (Signed)
STROKE TEAM PROGRESS NOTE   SUBJECTIVE (INTERVAL HISTORY) Pt no significant change of neuro status. Still on vent, intubated, sedation changed now on precedex. With weaning precedex, pt started coughing, desynchronizing with vent and has to be back on precedex. His INR 1.7, slight improvement but ammonia continues to trending up. He has bowel movement but not enough, will need increase lactulose dose.    OBJECTIVE Vitals:   10/17/18 0700 10/17/18 0800 10/17/18 0900 10/17/18 0916  BP: (!) 94/59 97/64 101/63 101/63  Pulse: (!) 48 (!) 53 (!) 52 (!) 53  Resp: (!) 21 16 17 18   Temp:  97.8 F (36.6 C)    TempSrc:  Axillary    SpO2: 97% 95% 95% 97%  Weight:      Height:        CBC:  Recent Labs  Lab 05/09/18 2048  05/09/18 2319  10/16/18 0536 10/16/18 1246 10/17/18 0547  WBC 9.0  --  9.7   < > 17.0*  --  10.4  NEUTROABS 5.8  --  8.1*  --   --   --   --   HGB 12.7*   < > 12.9*   < > 11.6* 11.6* 11.1*  HCT 35.7*   < > 37.6*   < > 34.9* 34.0* 34.0*  MCV 104.4*  --  110.3*   < > 112.9*  --  113.7*  PLT 128*  --  114*   < > 94*  --  74*   < > = values in this interval not displayed.    Basic Metabolic Panel:  Recent Labs  Lab 10/13/18 1849  10/15/18 0551  10/16/18 0536  10/16/18 1246  10/16/18 2139 10/17/18 0547  NA 154*   < > 155*   < > 157*   < > 160*   < > 157* 157*  K  --    < > 3.3*   < > 3.3*  --  3.3*  --   --  3.5  CL  --    < > 125*  --  127*  --   --   --   --  127*  CO2  --    < > 22  --  23  --   --   --   --  23  GLUCOSE  --    < > 110*  --  117*  --   --   --   --  129*  BUN  --    < > 30*  --  33*  --   --   --   --  39*  CREATININE  --    < > 0.91  --  1.04  --   --   --   --  0.98  CALCIUM  --    < > 9.0  --  8.8*  --   --   --   --  8.8*  MG 2.0  --  2.2  --  2.4  --   --   --   --  2.7*  PHOS 2.0*  --  2.3*  --   --   --   --   --   --   --    < > = values in this interval not displayed.    Lipid Panel:     Component Value Date/Time   CHOL 144  10/14/2018 0200   TRIG 206 (H) 10/16/2018 0759   HDL 66 10/14/2018 0200  CHOLHDL 2.2 10/14/2018 0200   VLDL 8 10/14/2018 0200   LDLCALC 70 10/14/2018 0200   HgbA1c:  Lab Results  Component Value Date   HGBA1C 5.0 10/18/2018   Urine Drug Screen:     Component Value Date/Time   LABOPIA NONE DETECTED 10/10/2018 2117   COCAINSCRNUR NONE DETECTED 10/07/2018 2117   COCAINSCRNUR NEG 03/17/2014 1456   LABBENZ POSITIVE (A) 10/03/2018 2117   AMPHETMU POSITIVE (A) 11/01/2018 2117   THCU NONE DETECTED 10/26/2018 2117   LABBARB NONE DETECTED 10/30/2018 2117    Alcohol Level     Component Value Date/Time   ETH <10 10/08/2018 2048    IMAGING Ct Head Wo Contrast  10/17/2018 1. Unchanged lobar, intraventricular, and subarachnoid clot. 2. EVD with stable normal ventricular volume. 3. 6 mm of midline shift.    Ct Angio Head W Or Wo Contrast Ct Angio Neck W Or Wo Contrast 10/15/2018 1. No visible explanation for lobar hemorrhage. 2. 3 mm aneurysm or infundibulum at the right carotid terminus. 3. 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 4. Atherosclerosis without flow limiting stenosis of major vessels. 5. Stable shunted ventricular volume.  No repeat hemorrhage.    Ct Head Wo Contrast  10/13/2018 1. Expected evolution of large left posterior frontal and parietal intraparenchymal hemorrhage. 2. Decompression of the ventricles following ventriculostomy tube placement. 3. Blood products previously seen in the fourth ventricle have cleared. 4. No new hemorrhage. Ct Head Wo Contrast  10/12/2018 1. No significant interval change in size and morphology of large intraparenchymal hematoma centered at the left frontoparietal region. Associated regional mass effect with 12 mm of left-to-right midline shift, relatively similar.  2. Intraventricular extension with large volume intraventricular hemorrhage, increased from previous exam. Slightly  worsened associated hydrocephalus.  3. No other new acute intracranial abnormality.   Ct Head Code Stroke Wo Contrast 10/28/2018 Large left frontal/parietal lobe junction intra-axial hemorrhage with estimated blood volume of 97 mL. Regional edema and mass effect with 11 mm of rightward midline shift. Intraventricular extension with moderate ventriculomegaly and transependymal edema. Small volume left convexity subarachnoid hemorrhage.  Dg Chest Portable 1 View 10/14/2018 1. Low lung volumes with bibasilar atelectasis versus airspace consolidation. 2. Support apparatus as described. 10/08/2018 1. The ETT terminates just above the thoracic inlet. Recommend advancing 7 cm. 21   Transthoracic Echocardiogram  10/13/2018  1. The left ventricle has hyperdynamic systolic function, with an ejection fraction of >65%. The cavity size was normal. There is mildly increased left ventricular wall thickness. Left ventricular diastolic Doppler parameters are consistent with  impaired relaxation.  2. The right ventricle has normal systolic function. The cavity was normal. There is no increase in right ventricular wall thickness.  3. The interatrial septum was not assessed.   PHYSICAL EXAM Temp:  [97.8 F (36.6 C)-101.2 F (38.4 C)] 97.8 F (36.6 C) (07/16 0800) Pulse Rate:  [44-89] 53 (07/16 0916) Resp:  [14-29] 18 (07/16 0916) BP: (89-137)/(50-79) 101/63 (07/16 0916) SpO2:  [94 %-100 %] 97 % (07/16 0916) FiO2 (%):  [50 %] 50 % (07/16 0916) Weight:  [110.5 kg] 110.5 kg (07/16 0500)  General - Well nourished, well developed, intubated on sedation. Sclera jaundice.   Ophthalmologic - fundi not visualized due to noncooperation.  Cardiovascular - Regular rate and rhythm.  Neuro - intubated on sedation, eyes closed, not following commands. With forced eye opening, eyes in mid position, not blinking to visual threat, doll's eyes sluggish, not  tracking, pupils 2mm, not reactive. Corneal reflex absent  bilaterally, gag and cough present. Breathing over the vent.  Facial symmetry not able to test due to ET tube.  Tongue protrusion not cooperative. On pain stimulation, slight withdraw on all extremities. Bilateral positive babinski. Sensation, coordination and gait not tested.   ASSESSMENT/PLAN Christian Sanders is a 58 y.o. male with history of  alcohol abuse, liver cirrhosis, hep C, Bipolar, hypertension including portal hypertension presented to [facility] emergency department after being found unresponsive by his wife. He did not receive IV t-PA due to hemorrhage. NS consult -> risks of surgery outweighed benefits.  ICH - Large left parietal ICH and IVH with midline shift - could be HTN in the setting of coagulopathy and thrombocytopenia due to cirrhosis   CT head 10/20/2018 - Large left frontal/parietal lobe junction intra-axial hemorrhage with regional edema and mass effect. Intraventricular extension with moderate ventriculomegaly and transependymal edema. Small volume left convexity subarachnoid hemorrhage.  CT Head 10/12/18 - No significant interval change of hematoma and MLS. Intraventricular extension with large volume intraventricular hemorrhage, increased from previous exam. Slightly worsened associated hydrocephalus.   CT head 10/13/2018 - decreased IVH and stable hematoma  CTA H&N 10/15/18 neg for source of ICH  CT head 7/16 ICH stable. EVD stable. Shift 6mm  2D Echo - EF >65%. No source of embolus   [employer] Corona Virus 2  - negative  LDL - 70  HgbA1c - 5.0  UDS - positive for amphetamines and benzodiazepines (Pt is on Xanax )  VTE prophylaxis - SCDs  No antithrombotic prior to admission, now on No antithrombotic due to hemorrhage  Therapy recommendations:  pending  Disposition:  Pending  Hydrocephalus  CT repeat 10/12/2018 increased ventriculomegaly and hydrocephalus  Status post EVD  Neurosurgery on board  CT repeat 10/13/2018 decrease IVH  CT head 7/14 stable  shunted volume  On Keppra 500 twice daily  on ancef 7/13 -> Rocephin 7/15  Currently EVD clamped today  CT head 7/16 ICH stable. EVD stable. Shift 6mm  NS plans to d/c EVD today  Cerebral edema  CT head showed mass-effect with midline shift  off 3% saline -> now on free water  Sodium 154-157-160-156-157->155->159->157  Sodium goal 150-155  Sodium every 6 hours  On 1/2 NS @ 50  CT head 10/13/2018 improved midline shift down from 12->6 mm  CT head 7/14 stable shunted volume  CT head 7/16 ICH stable. EVD stable. Shift 6mm  Decompensated liver failure Coagulopathy  Hyperammonemia  Thrombocytopenia  Hepatic encephalopathy Alcoholic Liver cirrhosis Hepatitis C  INR 1.4 s/p FFP -> 1.7 s/p FFP ->1.8 FFP and Vit K->2.2->1.8->1.7  INR goal < 1.5  Ammonia level 55-> 98->103->156->189, increased lactulose    Alb 3.0, AST 60, TBIL 3.8, DBIL 0.8, IBILI 3.0  Thrombocytopenia 128->114->102->110->89->94->74  Monitor INR and ammonia level daily  History of varices  History of hepatitis C  History of portal hypertension  History of alcohol abuse  On rifampin  CCM on board  Fever Possible aspiration  T-max 101.8->101.1->100.8->99.9->101.2->afebrile  WBC 9.0->9.7->20.6->20.2->13.2->17.0->10.4  on ancef 7/13 -> Rocephin 7/15  CCM on board  Respiratory Cx abundant streptococcus pneumoniaa  CXR bibasilar atx  On abx x 7 day course  UA pending  Acute Respiratory Failure in setting of ICH  CCM on board  Still on vent with sedation  Sedation switched from propofol to precedex  Also on seroquel 50mg  bid  Hypertension  Stable on the lower end . BP goal <  160 mm Hg . Treated with Cleviprex now off . Not on BP meds . Long-term BP goal normotensive  Dysphagia Malnutrition . Secondary to stroke . NPO . On tube feedings @ 40cc/h . Albumin 2.2  Tobacco abuse  Current smoker  Smoking cessation counseling will be provided  Other Stroke Risk  Factors  History of ETOH abuse  Obesity, Body mass index is 31.28 kg/m., recommend weight loss, diet and exercise as appropriate   Substance abuse -UDS positive amphetamine  Other Active Problems  Mild anemia due to chronic disease- Hb - 11.1->11.7->11.1->10.2->11.6->11.1 (macrocytic indices)   Hypokalemia 3.0 - replaced - 3.3 replaced -> 3.5->3.3->3.5  Hospital day # 6  This patient is critically ill due to [place], IVH and cerebral edema, liver cirrhosis, coagulopathy and hyperammonemia and at significant risk of neurological worsening, death form hematoma expansion, hydrocephalus, seizure, CNS infection, bleeding, encephalopathy. This patient's care requires constant monitoring of vital signs, hemodynamics, respiratory and cardiac monitoring, review of multiple databases, neurological assessment, discussion with family, other specialists and medical decision making of high complexity. I spent 35 minutes of neurocritical care time in the care of this patient.  I also discussed with CCM Dr. Lynetta Mare. I had long discussion with daughter Christian Sanders over the phone, updated pt current condition, treatment plan and potential prognosis. She expressed understanding and appreciation.    Rosalin Hawking, MD PhD Stroke Neurology 10/17/2018 9:57 AM    To contact Stroke Continuity provider, please refer to [email or web address] After hours, contact General Neurology

## 2018-10-17 NOTE — Progress Notes (Signed)
Patient transported on ventilator to CT and back to 5E52 with no complications. Vitals stable throughout trip.

## 2018-10-17 NOTE — Progress Notes (Signed)
Palliative:  Chart reviewed in detail. Called to speak with Merrily Pew, RN prior to calling family - Josh informed me that family is currently on their way into the hospital to visit patient. He tells me events of today - family very upset with poor prognosis explained to them this am - multiple calls to hospital leadership and requesting patient transfer to another hospital. Merrily Pew shares that they are coming to the hospital today to see patient and speak with CCM provider again, considering comfort care.   Josh will contact PMT if we can be of assistance today. Otherwise, will check back in tomorrow - if no decisions have been made, will plan to reach out to daughter tomorrow to continue goals of care discussions.   Thank you for this consult.  Juel Burrow, DNP, AGNP-C Palliative Medicine Team Team Phone # [phone]  Pager # [phone]  No charge

## 2018-10-18 DIAGNOSIS — Z515 Encounter for palliative care: Secondary | ICD-10-CM

## 2018-10-18 DIAGNOSIS — Z7189 Other specified counseling: Secondary | ICD-10-CM

## 2018-10-18 LAB — SODIUM: Sodium: 157 mmol/L — ABNORMAL HIGH (ref 135–145)

## 2018-10-18 LAB — PROTIME-INR
INR: 1.8 — ABNORMAL HIGH (ref 0.8–1.2)
Prothrombin Time: 20.5 seconds — ABNORMAL HIGH (ref 11.4–15.2)

## 2018-10-18 LAB — GLUCOSE, CAPILLARY
Glucose-Capillary: 112 mg/dL — ABNORMAL HIGH (ref 70–99)
Glucose-Capillary: 116 mg/dL — ABNORMAL HIGH (ref 70–99)
Glucose-Capillary: 101 mg/dL — ABNORMAL HIGH (ref 70–99)
Glucose-Capillary: 115 mg/dL — ABNORMAL HIGH (ref 70–99)
Glucose-Capillary: 116 mg/dL — ABNORMAL HIGH (ref 70–99)
Glucose-Capillary: 118 mg/dL — ABNORMAL HIGH (ref 70–99)

## 2018-10-18 LAB — AMMONIA: Ammonia: 112 umol/L — ABNORMAL HIGH (ref 9–35)

## 2018-10-18 NOTE — Consult Note (Signed)
Consultation Note Date: 10/18/2018   Patient Name: Christian Sanders  DOB: 1961/04/02  MRN: [id number]  Age / Sex: 58 y.o., male  PCP: Christian Sanders, [place] Referring Physician: Rigoberto Noel, MD  Reason for Consultation: Establishing goals of care  HPI/Patient Profile: 58 y.o. male  with past medical history of ETOH abuse, alcoholic cirrhosis, hep C, GERD, GI bleeds, borderline personality disorder, PTSD, depression, anxiety, panic disorder, and chronic pain admitted on 10/25/2018 with right sided weakness and aphasia. CT head revealed large ICH and IVH.  Patient experienced decline in mental status and was intubated for airway protection. 7/11 patient had right frontal intraventricular catheter placed. Patient has been unresponsive and unable to tolerate weaning of ventilator. Also of note, patient not a PEG tube candidate due to cirrhosis and ascites and therefore there are no long-term feeding options if trach were pursued. PMT consulted for [place].  Clinical Assessment and Goals of Care: I have reviewed medical records including EPIC notes, labs and imaging, and received report from RN - RN shares that family visited yesterday evening, no decisions were made. RN tells patient remains unresponsive and does not tolerate weaning trials.    I then spoke with patient's daughter, Christian Sanders, to discuss diagnosis prognosis, [place], EOL wishes, disposition and options.  I introduced Palliative Medicine as specialized medical care for people living with serious illness. It focuses on providing relief from the symptoms and stress of a serious illness. The goal is to improve quality of life for both the patient and the family.  We discussed a brief life review of the patient. Christian Sanders shares that patient quit drinking about 2 years ago when he received his cirrhosis diagnoses. She tells me initially he had several complications with GI bleeds but recently he had been doing  very well. She even notes that the day prior to patient's admission the whole family had been together and had a great.   Christian Sanders was quite tearful throughout our conversation. Emotional support was provided. She discusses visiting the patient yesterday evening - tells me how shocking it was to see the condition he was in but also helped her to accept reality of the situation.    We discussed patient's current illness and what it means in the larger context of his on-going co-morbidities.  Natural disease trajectory and expectations at EOL were discussed. We reviewed her conversations with Dr. Lynetta Sanders and Dr. Erlinda Sanders and all questions were addressed.   I attempted to elicit values and goals of care important to the patient.  She tells me that the patient had always told her if he were sick to "give me a fighting chance but I do not want to be dependent on others".  The difference between aggressive medical intervention and comfort care was considered in light of the patient's goals of care. Christian Sanders tells me she has accepted that her father is at the end of his life and does not want to prolong his suffering - she would like to focus on his comfort.  We had a detailed discussed of what this type of care entails - discussed liberating patient from ventilator, discussed using medications to control his symptoms and ensure that he does not suffer. She agrees to this. She tells me her husband is working today and asks if we can wait for withdrawal of care until Saturday.   We discussed code status and discussed that DNR is more appropriate given overall goals of care - she agrees to this. We discussed maintaining current  therapy until Saturday but not escalating. Discussed no more need for labs, tube feeding.   Christian Sanders plans to bring her husband, patient's sig other, and their pastor tomorrow for extubation. She requests to spend some time with him prior to extubation.  Questions and concerns were addressed.  The family was encouraged to call with questions or concerns.   Discussed above with Dr. Lynetta Sanders - CCM will move forward with extubation tomorrow when family is ready  Primary Decision Maker NEXT OF KIN - daughter Christian Sanders    SUMMARY OF RECOMMENDATIONS   - plan for terminal extubation Saturday - discussed with CCM who will order when family is ready - daughter requests some time with patient prior to extubation - aggressive symptom control as daughter's main concern is that patient not suffer - may want to start morphine drip prior to extubation tomorrow - per RN patient is comfortable with current regimen - please request chaplain at bedside per family request  Code Status/Advance Care Planning:  DNR  Prognosis:   Hours - Days  Discharge Planning: Anticipated Hospital Death      Primary Diagnoses: Present on Admission:  Intracerebral hemorrhage ([place])   I have reviewed the medical record, interviewed the patient and family, and examined the patient. The following aspects are pertinent.  Past Medical History:  Diagnosis Date   Abnormal CT of liver 12/20/2016   Anxiety    Bipolar disorder (HCC)    Cirrhosis (HCC)    Dentures complicating chewing    full upper, partial lower   Depression    Esophageal varices determined by endoscopy (HCC)    Gastritis and duodenitis    GERD (gastroesophageal reflux disease)    Headache    chronic, s/p facial injury and reconstruction   Hx of hepatitis C    Hypertension    Portal hypertensive gastropathy (HCC)    PTSD (post-traumatic stress disorder)    Vertebral column disorder    2 crushed thoracic vertebra   Social History   Socioeconomic History   Marital status: Single    Spouse name: Not on file   Number of children: Not on file   Years of education: Not on file   Highest education level: Not on file  Occupational History   Not on file  Social Needs   Financial resource strain: Not on file    Food insecurity    Worry: Not on file    Inability: Not on file   Transportation needs    Medical: Not on file    Non-medical: Not on file  Tobacco Use   Smoking status: Current Every Day Smoker    Packs/day: 0.25    Years: 30.00    Pack years: 7.50    Types: Cigarettes   Smokeless tobacco: Never Used  Substance and Sexual Activity   Alcohol use: No    Alcohol/week: 6.0 standard drinks    Types: 6 Cans of beer per week    Frequency: Never    Comment: None as of 03/08/17; previously 1-1.5 cases of beer a day in his 8s.   Drug use: No   Sexual activity: Never    Birth control/protection: None  Lifestyle   Physical activity    Days per week: Not on file    Minutes per session: Not on file   Stress: Not on file  Relationships   Social connections    Talks on phone: Not on file    Gets together: Not on file    Attends religious  service: Not on file    Active member of club or organization: Not on file    Attends meetings of clubs or organizations: Not on file    Relationship status: Not on file  Other Topics Concern   Not on file  Social History Narrative   Not on file   Family History  Problem Relation Age of Onset   Leukemia Father    Colon cancer Neg Hx    Liver disease Neg Hx    Scheduled Meds:   stroke: mapping our early stages of recovery book   Does not apply Once   chlorhexidine gluconate (MEDLINE KIT)  15 mL Mouth Rinse BID   docusate  50 mg Per Tube BID   insulin aspart  0-15 Units Subcutaneous Q4H   lactulose  45 g Per Tube TID   levETIRAcetam  500 mg Per Tube BID   mouth rinse  15 mL Mouth Rinse 10 times per day   pantoprazole sodium  40 mg Per Tube BID   QUEtiapine  50 mg Per Tube BID   rifaximin  550 mg Per Tube Q8H   sennosides  5 mL Per Tube BID   sodium chloride flush  10-40 mL Intracatheter Q12H   tamsulosin  0.4 mg Oral Daily   Continuous Infusions:  sodium chloride Stopped (10/18/18 1226)   cefTRIAXone  (ROCEPHIN)  IV Stopped (10/18/18 1256)   dexmedetomidine (PRECEDEX) IV infusion 1 mcg/kg/hr (10/18/18 1500)   PRN Meds:.acetaminophen **OR** acetaminophen (TYLENOL) oral liquid 160 mg/5 mL **OR** acetaminophen, fentaNYL (SUBLIMAZE) injection, labetalol, sodium chloride flush Allergies  Allergen Reactions   Bee Venom Anaphylaxis, Swelling and Other (See Comments)    Throat and Tongue swell   Ibuprofen Other (See Comments)    Due to liver   Pineapple Nausea And Vomiting   Pollen Extract Other (See Comments)    Runny nose, itchy, watery eyes, congestion   Tylenol [Acetaminophen] Other (See Comments)    Due to liver    Vital Signs: BP 120/72    Pulse (!) 42    Temp 98.8 F (37.1 C) (Axillary)    Resp 19    Ht '6\' 2"'  (1.88 m)    Wt 115.8 kg    SpO2 98%    BMI 32.78 kg/m  Pain Scale: CPOT       SpO2: SpO2: 98 % O2 Device:SpO2: 98 % O2 Flow Rate: .   IO: Intake/output summary:   Intake/Output Summary (Last 24 hours) at 10/18/2018 1735 Last data filed at 10/18/2018 1500 Gross per 24 hour  Intake 2558.67 ml  Output 1425 ml  Net 1133.67 ml    LBM: Last BM Date: 10/18/18 Baseline Weight: Weight: 110.4 kg Most recent weight: Weight: 115.8 kg     Palliative Assessment/Data: PPS 10%    The above conversation was completed via telephone due to the visitor restrictions during the COVID-19 pandemic. Thorough chart review and discussion with necessary members of the care team was completed as part of assessment. All issues were discussed and addressed but no physical exam was performed.  Time Total: 85 minutes Greater than 50%  of this time was spent counseling and coordinating care related to the above assessment and plan.  Juel Burrow, DNP, AGNP-C Palliative Medicine Team [phone] Pager: [phone]

## 2018-10-18 NOTE — Progress Notes (Signed)
Palliative:  Attempted to call patient's daughter at number listed in chart - no answer. Voice mail left with my call back number. Will await call back and try to call again this afternoon. If any other members of medical team speak with daughter - she can call palliative team at [phone].  Thank you.  Juel Burrow, DNP, AGNP-C Palliative Medicine Team Team Phone # [phone]  Pager # [phone]  NO CHARGE

## 2018-10-18 NOTE — Progress Notes (Signed)
STROKE TEAM PROGRESS NOTE   SUBJECTIVE (INTERVAL HISTORY) Patient lying in bed, still on ventilation.  Not responding.  Minimum withdrawal on pain stimulation.  INR 1.8.  Ammonia level much improved 112.  Palliative care services involved.   OBJECTIVE Vitals:   10/18/18 1200 10/18/18 1300 10/18/18 1400 10/18/18 1500  BP: 119/73 116/73 112/67 120/72  Pulse: (!) 53 (!) 57 (!) 52 (!) 42  Resp: (!) 21 20 18 19   Temp: 98.8 F (37.1 C)     TempSrc: Axillary     SpO2: 95% 96% 97% 98%  Weight:      Height:        CBC:  Recent Labs  Lab 2018/04/28 2048  2018/04/28 2319  10/16/18 0536 10/16/18 1246 10/17/18 0547  WBC 9.0  --  9.7   < > 17.0*  --  10.4  NEUTROABS 5.8  --  8.1*  --   --   --   --   HGB 12.7*   < > 12.9*   < > 11.6* 11.6* 11.1*  HCT 35.7*   < > 37.6*   < > 34.9* 34.0* 34.0*  MCV 104.4*  --  110.3*   < > 112.9*  --  113.7*  PLT 128*  --  114*   < > 94*  --  74*   < > = values in this interval not displayed.    Basic Metabolic Panel:  Recent Labs  Lab 10/13/18 1849  10/15/18 0551  10/16/18 0536  10/16/18 1246  10/17/18 0547  10/17/18 2057 10/18/18 0324  NA 154*   < > 155*   < > 157*   < > 160*   < > 157*   < > 158* 157*  K  --    < > 3.3*   < > 3.3*  --  3.3*  --  3.5  --   --   --   CL  --    < > 125*  --  127*  --   --   --  127*  --   --   --   CO2  --    < > 22  --  23  --   --   --  23  --   --   --   GLUCOSE  --    < > 110*  --  117*  --   --   --  129*  --   --   --   BUN  --    < > 30*  --  33*  --   --   --  39*  --   --   --   CREATININE  --    < > 0.91  --  1.04  --   --   --  0.98  --   --   --   CALCIUM  --    < > 9.0  --  8.8*  --   --   --  8.8*  --   --   --   MG 2.0  --  2.2  --  2.4  --   --   --  2.7*  --   --   --   PHOS 2.0*  --  2.3*  --   --   --   --   --   --   --   --   --    < > = values in this interval  not displayed.    Lipid Panel:     Component Value Date/Time   CHOL 144 10/14/2018 0200   TRIG 206 (H) 10/16/2018 0759   HDL  66 10/14/2018 0200   CHOLHDL 2.2 10/14/2018 0200   VLDL 8 10/14/2018 0200   LDLCALC 70 10/14/2018 0200   HgbA1c:  Lab Results  Component Value Date   HGBA1C 5.0 10-Nov-2018   Urine Drug Screen:     Component Value Date/Time   LABOPIA NONE DETECTED 2018-11-10 2117   COCAINSCRNUR NONE DETECTED 11/10/2018 2117   COCAINSCRNUR NEG 03/17/2014 1456   LABBENZ POSITIVE (A) November 10, 2018 2117   AMPHETMU POSITIVE (A) 2018/11/10 2117   THCU NONE DETECTED 11/10/18 2117   LABBARB NONE DETECTED 11/10/2018 2117    Alcohol Level     Component Value Date/Time   ETH <10 11/10/2018 2048    IMAGING Ct Head Wo Contrast  10/17/2018 1. Unchanged lobar, intraventricular, and subarachnoid clot. 2. EVD with stable normal ventricular volume. 3. 6 mm of midline shift.    Ct Angio Head W Or Wo Contrast Ct Angio Neck W Or Wo Contrast 10/15/2018 1. No visible explanation for lobar hemorrhage. 2. 3 mm aneurysm or infundibulum at the right carotid terminus. 3. 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 4. Atherosclerosis without flow limiting stenosis of major vessels. 5. Stable shunted ventricular volume.  No repeat hemorrhage.    Ct Head Wo Contrast  10/13/2018 1. Expected evolution of large left posterior frontal and parietal intraparenchymal hemorrhage. 2. Decompression of the ventricles following ventriculostomy tube placement. 3. Blood products previously seen in the fourth ventricle have cleared. 4. No new hemorrhage. Ct Head Wo Contrast  10/12/2018 1. No significant interval change in size and morphology of large intraparenchymal hematoma centered at the left frontoparietal region. Associated regional mass effect with 12 mm of left-to-right midline shift, relatively similar.  2. Intraventricular extension with large volume intraventricular hemorrhage, increased from previous exam. Slightly worsened associated hydrocephalus.  3. No other new acute  intracranial abnormality.   Ct Head Code Stroke Wo Contrast 11/10/18 Large left frontal/parietal lobe junction intra-axial hemorrhage with estimated blood volume of 97 mL. Regional edema and mass effect with 11 mm of rightward midline shift. Intraventricular extension with moderate ventriculomegaly and transependymal edema. Small volume left convexity subarachnoid hemorrhage.  Dg Chest Portable 1 View 10/14/2018 1. Low lung volumes with bibasilar atelectasis versus airspace consolidation. 2. Support apparatus as described. 2018-11-10 1. The ETT terminates just above the thoracic inlet. Recommend advancing 7 cm. 21   Transthoracic Echocardiogram  10/13/2018  1. The left ventricle has hyperdynamic systolic function, with an ejection fraction of >65%. The cavity size was normal. There is mildly increased left ventricular wall thickness. Left ventricular diastolic Doppler parameters are consistent with  impaired relaxation.  2. The right ventricle has normal systolic function. The cavity was normal. There is no increase in right ventricular wall thickness.  3. The interatrial septum was not assessed.   PHYSICAL EXAM Temp:  [98 F (36.7 C)-98.8 F (37.1 C)] 98.8 F (37.1 C) (07/17 1200) Pulse Rate:  [42-72] 42 (07/17 1500) Resp:  [14-34] 19 (07/17 1500) BP: (99-123)/(56-77) 120/72 (07/17 1500) SpO2:  [92 %-100 %] 98 % (07/17 1500) FiO2 (%):  [40 %-50 %] 40 % (07/17 1130) Weight:  [115.8 kg] 115.8 kg (07/17 0325)  General - Well nourished, well developed, intubated on Precedex. Sclera jaundice.   Ophthalmologic - fundi not visualized due  to noncooperation.  Cardiovascular - Regular rate and rhythm.  Neuro - intubated on Precedex, right eye closed, left eye half closed, not following commands. With forced eye opening, eyes in mid position, not blinking to visual threat, doll's eyes sluggish, not tracking, pupils 3 mm, reactive to light. Corneal reflex absent bilaterally, gag and cough  present but weak. Breathing over the vent.  Facial symmetry not able to test due to ET tube.  Tongue protrusion not cooperative. On pain stimulation, slight withdraw on all extremities. Bilateral positive babinski. Sensation, coordination and gait not tested.   ASSESSMENT/PLAN [name] is a 58 y.o. male with history of  alcohol abuse, liver cirrhosis, hep C, Bipolar, hypertension including portal hypertension presented [facility] emergency department after being found unresponsive by his wife. He did not receive IV t-PA due to hemorrhage. NS consult -> risks of surgery outweighed benefits.  ICH - Large left parietal ICH and IVH with midline shift - could be HTN in the setting of coagulopathy and thrombocytopenia due to cirrhosis   CT head 10/31/2018 - Large left frontal/parietal lobe junction intra-axial hemorrhage with regional edema and mass effect. Intraventricular extension with moderate ventriculomegaly and transependymal edema. Small volume left convexity subarachnoid hemorrhage.  CT Head 10/12/18 - No significant interval change of hematoma and MLS. Intraventricular extension with large volume intraventricular hemorrhage, increased from previous exam. Slightly worsened associated hydrocephalus.   CT head 10/13/2018 - decreased IVH and stable hematoma  CTA H&N 10/15/18 neg for source of ICH  CT head 7/16 ICH stable. EVD stable. Shift 6mm  2D Echo - EF >65%. No source of embolus   [employer] Corona Virus 2  - negative  LDL - 70  HgbA1c - 5.0  UDS - positive for amphetamines and benzodiazepines (Pt is on Xanax )  VTE prophylaxis - SCDs  No antithrombotic prior to admission, now on No antithrombotic due to hemorrhage  Therapy recommendations:  pending  Disposition:  Pending, palliative care involved for GOC discussion  Hydrocephalus  CT repeat 10/12/2018 increased ventriculomegaly and hydrocephalus  Status post EVD  Neurosurgery on board  CT repeat 10/13/2018 decrease  IVH  CT head 7/14 stable shunted volume  On Keppra 500 twice daily  on ancef 7/13 -> Rocephin 7/15  Currently EVD clamped today  CT head 7/16 ICH stable. EVD stable. Shift 6mm  EVD removed 7/16. Ok to remove staples and stitches in 1.5 weeks  Neurosurgery signed off  Cerebral edema  CT head showed mass-effect with midline shift  off 3% saline -> now on free water  Sodium 154-157-160-156-157->155->159->157  Sodium goal 150-155  Sodium every 6 hours  On 1/2 NS @ 50  CT head 10/13/2018 improved midline shift down from 12->6 mm  CT head 7/14 stable shunted volume  CT head 7/16 ICH stable. EVD stable. Shift 6mm  Decompensated liver failure Coagulopathy  Hyperammonemia  Thrombocytopenia  Hepatic encephalopathy Alcoholic Liver cirrhosis Hepatitis C  INR 1.4 s/p FFP -> 1.7 s/p FFP ->1.8 FFP and Vit K->2.2->1.8->1.7->1.8  INR goal < 1.5  Ammonia level 55-> 98->103->156->189->112 on increased lactulose    Alb 3.0, AST 60, TBIL 3.8, DBIL 0.8, IBILI 3.0  Thrombocytopenia 128->114->102->110->89->94->74  Monitor INR and ammonia level daily  History of varices  History of hepatitis C  History of portal hypertension  History of alcohol abuse  On rifampin  CCM on board  Fever Possible aspiration  T-max 101.8->101.1->100.8->99.9->101.2->afebrile  WBC 9.0->9.7->20.6->20.2->13.2->17.0->10.4  on ancef 7/13 -> Rocephin 7/15  CCM on board  Respiratory Cx abundant streptococcus pneumoniaa  CXR bibasilar atx  On abx x 7 day course  Acute Respiratory Failure in setting of ICH  CCM on board  Still on vent  Sedation switched from propofol to precedex  Also on seroquel 50mg  bid  Hypertension  Stable . BP goal < 160 mm Hg . Treated with Cleviprex now off . Not on BP meds . Long-term BP goal normotensive  Dysphagia Malnutrition . Secondary to stroke . NPO . On tube feedings @ 40cc/h . Albumin 2.2 . Not a PEG candidate given cirrhosis and  ascites, so no long-term feeding option  Tobacco abuse  Current smoker  Smoking cessation counseling will be provided  Other Stroke Risk Factors  History of ETOH abuse  Obesity, Body mass index is 32.78 kg/m., recommend weight loss, diet and exercise as appropriate   Substance abuse -UDS positive amphetamine  Other Active Problems  Mild anemia due to chronic disease- Hb - 11.1->11.7->11.1->10.2->11.6->11.1 (macrocytic indices)   Hypokalemia 3.0 - replaced - 3.3 replaced -> 3.5->3.3->3.5   Hospital day # 7  This patient is critically ill due to ICH, IVH and cerebral edema, liver cirrhosis, coagulopathy and hyperammonemia and at significant risk of neurological worsening, death form hematoma expansion, hydrocephalus, seizure, CNS infection, bleeding, encephalopathy. This patient's care requires constant monitoring of vital signs, hemodynamics, respiratory and cardiac monitoring, review of multiple databases, neurological assessment, discussion with family, other specialists and medical decision making of high complexity. I spent 35 minutes of neurocritical care time in the care of this patient. I had long discussion with daughter Shanda BumpsJessica over the phone yesterday, updated pt current condition, treatment plan and potential prognosis. She expressed understanding and appreciation.  Palliative care service on board today.   Marvel PlanJindong Shamecca Whitebread, MD PhD Stroke Neurology 10/18/2018 3:41 PM    To contact Stroke Continuity provider, please refer to [email or web address]. After hours, contact General Neurology

## 2018-10-18 NOTE — Progress Notes (Signed)
Nutrition Brief Note  Chart reviewed. Pt now transitioning to comfort care.  No further nutrition interventions warranted at this time.  Please re-consult as needed.   Shivon Hackel RD, LDN, CNSC [phone] Pager [phone] After Hours Pager    

## 2018-10-18 NOTE — Progress Notes (Signed)
Subjective: Patient continues on ventilator via ETT.  CT of brain yesterday after IVC clamped for a day showed stable ventricular size, without ventriculomegaly; little change overall in dominant hemisphere ICH.  Objective: Vital signs in last 24 hours: Vitals:   10/18/18 0500 10/18/18 0600 10/18/18 0700 10/18/18 0800  BP: 102/60 105/63 111/67 (!) 103/56  Pulse: (!) 56 (!) 54 (!) 54 (!) 51  Resp: 17 18 17 14   Temp:    98 F (36.7 C)  TempSrc:    Axillary  SpO2: 97% 98% 99% 97%  Weight:      Height:        Intake/Output from previous day: 07/16 0701 - 07/17 0700 In: 2898.8 [I.V.:1788.7; NG/GT:1010; IV Piggyback:100.1] Out: 1325 [Urine:1325] Intake/Output this shift: Total I/O In: 82.7 [I.V.:82.7] Out: -   Physical Exam: Not opening eyes to voice or pain.  Not following commands.  Minimal movement of extremities.  CBC Recent Labs    10/16/18 0536 10/16/18 1246 10/17/18 0547  WBC 17.0*  --  10.4  HGB 11.6* 11.6* 11.1*  HCT 34.9* 34.0* 34.0*  PLT 94*  --  74*   BMET Recent Labs    10/16/18 0536  10/16/18 1246  10/17/18 0547  10/17/18 2057 10/18/18 0324  NA 157*   < > 160*   < > 157*   < > 158* 157*  K 3.3*  --  3.3*  --  3.5  --   --   --   CL 127*  --   --   --  127*  --   --   --   CO2 23  --   --   --  23  --   --   --   GLUCOSE 117*  --   --   --  129*  --   --   --   BUN 33*  --   --   --  39*  --   --   --   CREATININE 1.04  --   --   --  0.98  --   --   --   CALCIUM 8.8*  --   --   --  8.8*  --   --   --    < > = values in this interval not displayed.    Studies/Results: Ct Head Wo Contrast  Result Date: 10/17/2018 CLINICAL DATA:  Follow-up known intracranial hemorrhage EXAM: CT HEAD WITHOUT CONTRAST TECHNIQUE: Contiguous axial images were obtained from the base of the skull through the vertex without intravenous contrast. COMPARISON:  Two days ago FINDINGS: Brain: Unchanged shape and extent of left parietal lobar hemorrhage centered in the white  matter measuring up to 6 x 5 x 4 cm on axial slices. There is a rim of vasogenic edema. Intraventricular extension with clot in the lateral ventricles that is unchanged. There is a right frontal EVD in stable position. Stable normal ventricular volume. Minimal hemorrhage along the catheter is unchanged. Mild patchy subarachnoid hemorrhage along the convexities is unchanged. No interval infarct. Unchanged midline shift when allowing for head tilting, measuring 6 mm today. Vascular: No acute finding Skull: No acute finding Sinuses/Orbits: Remote left orbital rim fractures and orbital floor fracture. IMPRESSION: 1. Unchanged lobar, intraventricular, and subarachnoid clot. 2. EVD with stable normal ventricular volume. 3. 6 mm of midline shift. Electronically Signed   By: Marnee SpringJonathon  Watts M.D.   On: 10/17/2018 06:46    Assessment/Plan: It will change from a neurosurgical perspective.  IVC removed and exit site stapled closed.  Staples and stitches can be removed in a week and a half.  Case discussed with Dr. Kipp Brood.  No further neurosurgical follow-up needed at this time.  Hosie Spangle, MD 10/18/2018, 9:22 AM

## 2018-10-18 NOTE — Progress Notes (Signed)
NAME:  Christian Sanders, MRN:  [id number], DOB:  10/18/1960, LOS: 7 ADMISSION DATE:  October 06, 2018, CONSULTATION DATE:  October 06, 2018 [facility]FERRING MD:  [name], CHIEF COMPLAINT:  ICH  Brief History   58 yo M found to have large L parietal hemorrage with intraventricular extension. [facility]ub[facility] at [facility] and [facility]. UDS + for amphetamines / benzo's.  FFP, Vit K given.  To OR 7/11 for placement of right frontal intraventricular catheter.   Past Medical History  EtOH abuse Alcoholic Cirrhosis Hep C GERD GI Bleed Gastritis  BPD Panic Disorder PTSD Anxiety Depression Coagulopathy HTN Portal HTN BPH Chronic Pain   Significant Hospital Events   [facility]   Consults:  NSGY Neuro PCCM  Procedures:  ETT 7/10 >>  R lateral ventricle EVD 7/10>>  Significant Diagnostic Tests:  7/10 CT Head non-con> Large L posterior frontal/parietal lobe intra-axial hemorrhage measuring 54x57x363mm, with associated edema and mass effect. Intraventricular extension with ventriculomegaly. Small L SAH. No SDH. Some Ventriculomegaly with edema at temporal and occipital horns. 11mm rightward midline shift. No uncal herniation. Partial effacement of suprasellar cistern. Blood present in 4th ventricle.  CXR 7/10 >> no acute cardiopulmonary abnormality. ETT at thoracic inlet and 7cm advancement is advised  CT Head (prior to EVD) 7/11 >> redemonstrated  L parietal bleed but now with extensive intraventricular extension and ventricular casting (personally reviewed) CT Head 7/12 >> EVD in good position in lateral ventricle. Ventricles appear decompressed and size of hemorrhage has diminished. 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  Stable shunted ventricular volume.   CT Head - Stable ventricular volumes post  clamping (personal review)  Micro Data:  7/10 SARS CoV2 > negative  Antimicrobials:  Cefazolin 7/13 >>7/15 Ceftriaxone 7/15  Interim history/subjective:  Remains sedated with need for Precedex to prevent ventilator dyssynchrony.  Still no response to painful stimuli.  Initiated goals of care discussion with family who were shocked and upset to hear of the patient's poor prognosis.   Objective   Blood pressure (!) 103/56, pulse (!) 51, temperature 98 F (36.7 C), temperature source Axillary, resp. rate 14, height 6\' 2"  (1.88 m), weight 115.8 kg, SpO2 97 %.    Vent Mode: PRVC FiO2 (%):  [50 %] 50 % Set Rate:  [12 bmp] 12 bmp Vt Set:  [660 mL] 660 mL PEEP:  [5 cmH20] 5 cmH20 Plateau Pressure:  [11 cmH20-15 cmH20] 11 cmH20   Intake/Output Summary (Last 24 hours) at 10/18/2018 0845 Last data filed at 10/18/2018 0800 Gross per 24 hour  Intake 2874.83 ml  Output 1325 ml  Net 1549.83 ml   Filed Weights   10/16/18 0500 10/17/18 0500 10/18/18 0325  Weight: 111 kg 110.5 kg 115.8 kg    Examination: General: ill appearing adult male lying in bed on vent in NAD HEENT: MM pink/moist, ETT, scleral icterus, EVD clamped. Neuro: sedated on vent, PERL, no response to painful stimulation.  No asterixis CV: s1s2 rrr, no m/r/g PULM: Synchronous with ventilator on dexmedetomidine.  Chest clear to auscultation GI: soft, round, bsx4 active  Extremities: warm/dry, trace generalized edema  Skin: no rashes or lesions.  Jaundiced, diffuse spider nevi  Resolved Hospital Problem list    Assessment & Plan:   Critically ill due to acute Respiratory Failure requiring mechanical ventilation in setting  of ICH Persistent tachypnea, without change based on ventilator support. . ABG shows mild respiratory alkalosis, consistent with centrally mediated hyperventilation. -Continue respiratory support -Goals of care discussion guarding tracheostomy.   Large L parietal ICH with Intraventricular Extension   Maintaining BP goals spontaneously. Amphetamines may have provoked BP surge leading to hemorrhage. -ICH score 4 predicts poor 30 day prognosis,  - EVD at 20 cmCSF with no change in mental status - for drain removal today.  - may require tracheostomy if no improvement in mental status.   Critically ill due to acute encephalopathy requiring titration of sedatives. On lactulose and rifaximin for possible HE. Mental status is unchanged despite modification and sedation strategy.  Fever due to possible aspiration vs hemorrhage., Bi-basilar infiltrates consistent with aspiration. Fever and leukocytosis are improving Complete 7 days of antibiotics for S. pneumoniae  Alcoholic Cirrhosis Coagulopathy   Hx Hep C Hyperammonemia is refractory, likely indicative of decompensated liver failure. Follow LFT's, INR Increase lactulose to 45 4 times daily Continue Rifaximin   Goals of care: Patient has had poor neurological recovery to this point Prognosis for acceptable functional recovery is poor from a neurological standpoint and diagnosis of cirrhosis further compounds his poor outcome Would not be a candidate for PEG tube given cirrhosis and ascites, therefore no long-term feeding option. Comfort care likely only reasonable option at this point. Will discuss with family.   Best practice:  Diet: NPO - tube feeds per recs Pain/Anxiety/Delirium protocol (if indicated): prn fentanyl, propofol   VAP protocol (if indicated): yes  DVT prophylaxis: SCD GI prophylaxis: protonix  Glucose control: SSI  Mobility: BR  Code Status: FULL  Family Communication: Spoke with daughter and son-in-law and updated them on the patient's condition.  They related that the information regarding prognosis that I am providing is different than what they had been led to believe based on video visits where they observed him moving his feet.  While initially they stated that they wanted a second opinion regarding his  condition when I pointed out that his prognosis for full recovery is poor, after I indicated that obtaining such a second opinion would be difficult given delays in transfer, they seem to acknowledge that his prognosis might not be good. Disposition: continue ICU care, have called Palliative care to assist with goals of care discussion.   Labs   CBC: Recent Labs  Lab 10/31/2018 2048  10/10/2018 2319  10/13/18 0342 10/14/18 0200 10/15/18 0551 10/15/18 1042 10/16/18 0536 10/16/18 1246 10/17/18 0547  WBC 9.0  --  9.7  --  20.6* 20.2* 13.2*  --  17.0*  --  10.4  NEUTROABS 5.8  --  8.1*  --   --   --   --   --   --   --   --   HGB 12.7*   < > 12.9*   < > 11.1* 11.7* 11.1* 10.2* 11.6* 11.6* 11.1*  HCT 35.7*   < > 37.6*   < > 31.8* 33.6* 32.8* 30.0* 34.9* 34.0* 34.0*  MCV 104.4*  --  110.3*  --  107.8* 108.0* 110.4*  --  112.9*  --  113.7*  PLT 128*  --  114*  --  102* 110* 89*  --  94*  --  74*   < > = values in this interval not displayed.    Basic Metabolic Panel: Recent Labs  Lab 10/12/18 0938 10/12/18 1618  10/13/18 0342 10/13/18 1849 10/14/18 0200  10/15/18 0551  10/15/18 1042  10/16/18 0536  10/16/18 1246  10/17/18 0547 10/17/18 0948 10/17/18 1630 10/17/18 2057 10/18/18 0324  NA 139 142   < > 148*  146* 154* 153*   < > 155*   < > 160*   < > 157*   < > 160*   < > 157* 158* 159* 158* 157*  K  --   --   --  3.6  --  3.0*  --  3.3*  --  3.5  --  3.3*  --  3.3*  --  3.5  --   --   --   --   CL  --   --   --  121*  --  124*  --  125*  --   --   --  127*  --   --   --  127*  --   --   --   --   CO2  --   --   --  19*  --  20*  --  22  --   --   --  23  --   --   --  23  --   --   --   --   GLUCOSE  --   --   --  139*  --  153*  --  110*  --   --   --  117*  --   --   --  129*  --   --   --   --   BUN  --   --   --  15  --  21*  --  30*  --   --   --  33*  --   --   --  39*  --   --   --   --   CREATININE  --   --   --  0.74  --  0.83  --  0.91  --   --   --  1.04  --   --   --   0.98  --   --   --   --   CALCIUM  --   --   --  8.8*  --  9.2  --  9.0  --   --   --  8.8*  --   --   --  8.8*  --   --   --   --   MG 1.9 1.9  --  2.0 2.0  --   --  2.2  --   --   --  2.4  --   --   --  2.7*  --   --   --   --   PHOS 2.9 2.4*  --  1.6* 2.0*  --   --  2.3*  --   --   --   --   --   --   --   --   --   --   --   --    < > = values in this interval not displayed.   GFR: Estimated Creatinine Clearance: 112.5 mL/min (by C-G formula based on SCr of 0.98 mg/dL). Recent Labs  Lab 10/31/2018 2056  10/14/18 0200 10/15/18 0551 10/16/18 0536 10/17/18 0547  WBC  --    < > 20.2* 13.2* 17.0* 10.4  LATICACIDVEN 3.2*  --   --   --   --   --    < > = values in  this interval not displayed.    Liver Function Tests: Recent Labs  Lab 10/07/2018 2048 10/14/18 0200 10/15/18 0551 10/16/18 0536 10/17/18 0547  AST 49* 60* 39 41 39  ALT 32 38 30 28 22   ALKPHOS 180* 124 102 115 106  BILITOT 1.8* 3.8* 3.4* 2.6* 2.0*  PROT 7.8 6.8 6.3* 6.5 6.0*  ALBUMIN 3.4* 3.0* 2.5* 2.4* 2.2*   No results for input(s): LIPASE, AMYLASE in the last 168 hours. Recent Labs  Lab 10/14/18 0200 10/15/18 1000 10/16/18 0536 10/17/18 0547 10/18/18 0324  AMMONIA 103* 103* 156* 189* 112*    ABG    Component Value Date/Time   PHART 7.496 (H) 10/16/2018 1246   PCO2ART 30.2 (L) 10/16/2018 1246   PO2ART 88.0 10/16/2018 1246   HCO3 23.1 10/16/2018 1246   TCO2 24 10/16/2018 1246   ACIDBASEDEF 4.0 (H) 10/26/2018 2327   O2SAT 97.0 10/16/2018 1246     Coagulation Profile: Recent Labs  Lab 10/14/18 0200 10/15/18 0551 10/16/18 0536 10/17/18 0547 10/18/18 0324  INR 1.8* 2.2* 1.8* 1.7* 1.8*    Cardiac Enzymes: No results for input(s): CKTOTAL, CKMB, CKMBINDEX, TROPONINI in the last 168 hours.  HbA1C: Hgb A1c MFr Bld  Date/Time Value Ref Range Status  10/17/2018 11:19 PM 5.0 4.8 - 5.6 % Final    Comment:    (NOTE) Pre diabetes:          5.7%-6.4% Diabetes:              >6.4% Glycemic control  for   <7.0% adults with diabetes     CBG: Recent Labs  Lab 10/17/18 1529 10/17/18 2052 10/17/18 2324 10/18/18 0321 10/18/18 0744  GLUCAP 118* 133* 129* 112* 116*    CRITICAL CARE Performed by: Kipp Brood  Total critical care time: 35 minutes   Critical care time was exclusive of separately billable procedures and treating other patients.  Critical care was necessary to treat or prevent imminent or life-threatening deterioration.  Critical care was time spent personally by me on the following activities: development of treatment plan with patient and/or surrogate as well as nursing, discussions with consultants, evaluation of patient's response to treatment, examination of patient, obtaining history from patient or surrogate, ordering and performing treatments and interventions, ordering and review of laboratory studies, ordering and review of radiographic studies, pulse oximetry, re-evaluation of patient's condition and participation in multidisciplinary rounds.  Kipp Brood, MD [facility] ICU Physician [place]  Pager: [phone] Mobile: [phone] After hours: [phone].   10/18/2018, 8:45 AM

## 2018-10-19 DIAGNOSIS — R188 Other ascites: Secondary | ICD-10-CM

## 2018-10-19 DIAGNOSIS — K7460 Unspecified cirrhosis of liver: Secondary | ICD-10-CM

## 2018-10-19 DIAGNOSIS — G9340 Encephalopathy, unspecified: Secondary | ICD-10-CM

## 2018-10-19 LAB — GLUCOSE, CAPILLARY
Glucose-Capillary: 107 mg/dL — ABNORMAL HIGH (ref 70–99)
Glucose-Capillary: 105 mg/dL — ABNORMAL HIGH (ref 70–99)

## 2018-10-19 MED ORDER — GLYCOPYRROLATE 0.2 MG/ML IJ SOLN
0.2000 mg | INTRAMUSCULAR | Status: DC | PRN
  Administered 2018-10-20 (×2): 0.2 mg via INTRAVENOUS
  Filled 2018-10-19 (×2): qty 1

## 2018-10-19 MED ORDER — MORPHINE 100MG IN NS 100ML (1MG/ML) PREMIX INFUSION
0.0000 mg/h | INTRAVENOUS | Status: DC
  Administered 2018-10-19 (×2): 5 mg/h via INTRAVENOUS
  Administered 2018-10-20: 20 mg/h via INTRAVENOUS
  Administered 2018-10-20: 8 mg/h via INTRAVENOUS
  Administered 2018-10-21: 20 mg/h via INTRAVENOUS
  Filled 2018-10-19 (×8): qty 100

## 2018-10-19 MED ORDER — MORPHINE BOLUS VIA INFUSION
5.0000 mg | INTRAVENOUS | Status: DC | PRN
  Filled 2018-10-19: qty 5

## 2018-10-19 MED ORDER — MORPHINE SULFATE (PF) 2 MG/ML IV SOLN
2.0000 mg | INTRAVENOUS | Status: DC | PRN
  Filled 2018-10-19: qty 1

## 2018-10-19 MED ORDER — GLYCOPYRROLATE 0.2 MG/ML IJ SOLN: 0.2000 mg | INTRAMUSCULAR | Status: DC | PRN

## 2018-10-19 MED ORDER — DIPHENHYDRAMINE HCL 50 MG/ML IJ SOLN: 25.0000 mg | INTRAMUSCULAR | Status: DC | PRN

## 2018-10-19 MED ORDER — ACETAMINOPHEN 325 MG PO TABS: 650.0000 mg | ORAL_TABLET | Freq: Four times a day (QID) | ORAL | Status: DC | PRN

## 2018-10-19 MED ORDER — POLYVINYL ALCOHOL 1.4 % OP SOLN
1.0000 [drp] | Freq: Four times a day (QID) | OPHTHALMIC | Status: DC | PRN
  Filled 2018-10-19: qty 15

## 2018-10-19 MED ORDER — METOPROLOL TARTRATE 25 MG PO TABS: 12.5000 mg | ORAL_TABLET | Freq: Four times a day (QID) | ORAL | Status: DC

## 2018-10-19 MED ORDER — DEXTROSE 5 % IV SOLN: INTRAVENOUS | Status: DC

## 2018-10-19 MED ORDER — LORAZEPAM 2 MG/ML IJ SOLN
2.0000 mg | INTRAMUSCULAR | Status: DC | PRN
  Administered 2018-10-20: 4 mg via INTRAVENOUS
  Administered 2018-10-20: 2 mg via INTRAVENOUS
  Filled 2018-10-19 (×2): qty 1
  Filled 2018-10-19: qty 2

## 2018-10-19 MED ORDER — ACETAMINOPHEN 650 MG RE SUPP: 650.0000 mg | Freq: Four times a day (QID) | RECTAL | Status: DC | PRN

## 2018-10-19 MED ORDER — GLYCOPYRROLATE 1 MG PO TABS
1.0000 mg | ORAL_TABLET | ORAL | Status: DC | PRN
  Filled 2018-10-19: qty 1

## 2018-10-19 NOTE — Progress Notes (Signed)
STROKE TEAM PROGRESS NOTE   SUBJECTIVE (INTERVAL HISTORY) Patient lying in bed, still on ventilator for resp failure  Not responding.  Minimum withdrawal on pain stimulation.   Palliative care services involved.. Family has decided on withdrawal of care and comfort care which be done later today.  OBJECTIVE Vitals:   10/19/18 0630 10/19/18 0700 10/19/18 0727 10/19/18 0800  BP: 105/62 103/60 103/61   Pulse: (!) 53 (!) 56 (!) 56   Resp: 18 19    Temp:    98.1 F (36.7 C)  TempSrc:    Axillary  SpO2: 98% 99% 100%   Weight:      Height:        CBC:  Recent Labs  Lab 10/16/18 0536 10/16/18 1246 10/17/18 0547  WBC 17.0*  --  10.4  HGB 11.6* 11.6* 11.1*  HCT 34.9* 34.0* 34.0*  MCV 112.9*  --  113.7*  PLT 94*  --  74*    Basic Metabolic Panel:  Recent Labs  Lab 10/13/18 1849  10/15/18 0551  10/16/18 0536  10/16/18 1246  10/17/18 0547  10/17/18 2057 10/18/18 0324  NA 154*   < > 155*   < > 157*   < > 160*   < > 157*   < > 158* 157*  K  --    < > 3.3*   < > 3.3*  --  3.3*  --  3.5  --   --   --   CL  --    < > 125*  --  127*  --   --   --  127*  --   --   --   CO2  --    < > 22  --  23  --   --   --  23  --   --   --   GLUCOSE  --    < > 110*  --  117*  --   --   --  129*  --   --   --   BUN  --    < > 30*  --  33*  --   --   --  39*  --   --   --   CREATININE  --    < > 0.91  --  1.04  --   --   --  0.98  --   --   --   CALCIUM  --    < > 9.0  --  8.8*  --   --   --  8.8*  --   --   --   MG 2.0  --  2.2  --  2.4  --   --   --  2.7*  --   --   --   PHOS 2.0*  --  2.3*  --   --   --   --   --   --   --   --   --    < > = values in this interval not displayed.    Lipid Panel:     Component Value Date/Time   CHOL 144 10/14/2018 0200   TRIG 206 (H) 10/16/2018 0759   HDL 66 10/14/2018 0200   CHOLHDL 2.2 10/14/2018 0200   VLDL 8 10/14/2018 0200   LDLCALC 70 10/14/2018 0200   HgbA1c:  Lab Results  Component Value Date   HGBA1C 5.0 10/29/2018   Urine Drug Screen:  Component Value Date/Time   LABOPIA NONE DETECTED 2018/10/14 2117   COCAINSCRNUR NONE DETECTED 10-14-18 2117   COCAINSCRNUR NEG 03/17/2014 1456   LABBENZ POSITIVE (A) 10/14/18 2117   AMPHETMU POSITIVE (A) 10-14-18 2117   THCU NONE DETECTED 10/14/2018 2117   LABBARB NONE DETECTED 10-14-2018 2117    Alcohol Level     Component Value Date/Time   ETH <10 10/14/18 2048    IMAGING Ct Head Wo Contrast  10/17/2018 1. Unchanged lobar, intraventricular, and subarachnoid clot. 2. EVD with stable normal ventricular volume. 3. 6 mm of midline shift.    Ct Angio Head W Or Wo Contrast Ct Angio Neck W Or Wo Contrast 10/15/2018 1. No visible explanation for lobar hemorrhage. 2. 3 mm aneurysm or infundibulum at the right carotid terminus. 3. 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 4. Atherosclerosis without flow limiting stenosis of major vessels. 5. Stable shunted ventricular volume.  No repeat hemorrhage.    Ct Head Wo Contrast  10/13/2018 1. Expected evolution of large left posterior frontal and parietal intraparenchymal hemorrhage. 2. Decompression of the ventricles following ventriculostomy tube placement. 3. Blood products previously seen in the fourth ventricle have cleared. 4. No new hemorrhage. Ct Head Wo Contrast  10/12/2018 1. No significant interval change in size and morphology of large intraparenchymal hematoma centered at the left frontoparietal region. Associated regional mass effect with 12 mm of left-to-right midline shift, relatively similar.  2. Intraventricular extension with large volume intraventricular hemorrhage, increased from previous exam. Slightly worsened associated hydrocephalus.  3. No other new acute intracranial abnormality.   Ct Head Code Stroke Wo Contrast 10-14-2018 Large left frontal/parietal lobe junction intra-axial hemorrhage with estimated blood volume of 97 mL. Regional edema and mass effect  with 11 mm of rightward midline shift. Intraventricular extension with moderate ventriculomegaly and transependymal edema. Small volume left convexity subarachnoid hemorrhage.  Dg Chest Portable 1 View 10/14/2018 1. Low lung volumes with bibasilar atelectasis versus airspace consolidation. 2. Support apparatus as described. October 14, 2018 1. The ETT terminates just above the thoracic inlet. Recommend advancing 7 cm. 21   Transthoracic Echocardiogram  10/13/2018  1. The left ventricle has hyperdynamic systolic function, with an ejection fraction of >65%. The cavity size was normal. There is mildly increased left ventricular wall thickness. Left ventricular diastolic Doppler parameters are consistent with  impaired relaxation.  2. The right ventricle has normal systolic function. The cavity was normal. There is no increase in right ventricular wall thickness.  3. The interatrial septum was not assessed.   PHYSICAL EXAM Temp:  [98.1 F (36.7 C)-98.8 F (37.1 C)] 98.1 F (36.7 C) (07/18 0800) Pulse Rate:  [42-72] 56 (07/18 0727) Resp:  [15-25] 19 (07/18 0700) BP: (98-120)/(55-84) 103/61 (07/18 0727) SpO2:  [95 %-100 %] 100 % (07/18 0727) FiO2 (%):  [40 %] 40 % (07/18 0727) Weight:  [114.6 kg] 114.6 kg (07/18 0500)  General - Well nourished, well developed, intubated  . Sclera jaundice.   Ophthalmologic - fundi not visualized due to noncooperation.  Cardiovascular - Regular rate and rhythm.  Neuro - intubated on ventilator.  Eyes are closed., right eye closed, left eye half closed, not following commands. With forced eye opening, eyes in mid position, not blinking to visual threat, doll's eyes sluggish, not tracking, pupils 3 mm, reactive to light. Corneal reflex absent bilaterally, gag and cough present but weak. Breathing over the vent.  Facial symmetry not able to test due to ET  tube.  Tongue protrusion not cooperative. On pain stimulation, slight withdraw on all extremities. Bilateral  positive babinski. Sensation, coordination and gait not tested.   ASSESSMENT/PLAN Christian Sanders is a 58 y.o. male with history of  alcohol abuse, liver cirrhosis, hep C, Bipolar, hypertension including portal hypertension [facility] after being found unresponsive by his wife. He did not receive IV t-PA due to hemorrhage. NS consult -> risks of surgery outweighed benefits.  ICH - Large left parietal ICH and IVH with midline shift - could be HTN in the setting of coagulopathy and thrombocytopenia due to cirrhosis   CT head 10/06/2018 - Large left frontal/parietal lobe junction intra-axial hemorrhage with regional edema and mass effect. Intraventricular extension with moderate ventriculomegaly and transependymal edema. Small volume left convexity subarachnoid hemorrhage.  CT Head 10/12/18 - No significant interval change of hematoma and MLS. Intraventricular extension with large volume intraventricular hemorrhage, increased from previous exam. Slightly worsened associated hydrocephalus.   CT head 10/13/2018 - decreased IVH and stable hematoma  CTA H&N 10/15/18 neg for source of ICH  CT head 7/16 ICH stable. EVD stable. Shift 6mm  2D Echo - EF >65%. No source of embolus   [employer] Corona Virus 2  - negative  LDL - 70  HgbA1c - 5.0  UDS - positive for amphetamines and benzodiazepines (Pt is on Xanax )  VTE prophylaxis - SCDs  No antithrombotic prior to admission, now on No antithrombotic due to hemorrhage  Therapy recommendations:  pending  Disposition:  Pending, palliative care involved for GOC discussion  Hydrocephalus  CT repeat 10/12/2018 increased ventriculomegaly and hydrocephalus  Status post EVD  Neurosurgery on board  CT repeat 10/13/2018 decrease IVH  CT head 7/14 stable shunted volume  On Keppra 500 twice daily  on ancef 7/13 -> Rocephin 7/15 - Day #4  Currently EVD clamped today  CT head 7/16 ICH stable. EVD stable. Shift  6mm  EVD removed 7/16. Ok to remove staples and stitches in 1.5 weeks  Neurosurgery signed off  Cerebral edema  CT head showed mass-effect with midline shift  off 3% saline -> now on free water  Sodium 154-157-160-156-157->155->159->157  Sodium goal 150-155  Sodium every 6 hours  On 1/2 NS @ 50  CT head 10/13/2018 improved midline shift down from 12->6 mm  CT head 7/14 stable shunted volume  CT head 7/16 ICH stable. EVD stable. Shift 6mm  Decompensated liver failure Coagulopathy  Hyperammonemia  Thrombocytopenia  Hepatic encephalopathy Alcoholic Liver cirrhosis Hepatitis C  INR 1.4 s/p FFP -> 1.7 s/p FFP ->1.8 FFP and Vit K->2.2->1.8->1.7->1.8  INR goal < 1.5  Ammonia level 55-> 98->103->156->189->112 on increased lactulose    Alb 3.0, AST 60, TBIL 3.8, DBIL 0.8, IBILI 3.0  Thrombocytopenia 128->114->102->110->89->94->74  Monitor INR and ammonia level daily  History of varices  History of hepatitis C  History of portal hypertension  History of alcohol abuse  On rifampin  CCM on board  Fever Possible aspiration  T-max 101.8->101.1->100.8->99.9->101.2->afebrile  WBC 9.0->9.7->20.6->20.2->13.2->17.0->10.4  on ancef 7/13 -> Rocephin 7/15 -  Day #4  CCM on board  Respiratory Cx - abundant streptococcus pneumoniaa  CXR bibasilar atx  On abx x 7 day course  Acute Respiratory Failure in setting of ICH  CCM on board  Still on vent  Sedation switched from propofol to precedex  Also on seroquel 50mg  bid  Hypertension  Stable . BP goal < 160 mm Hg . Treated with Cleviprex now off . Not  on BP meds . Long-term BP goal normotensive  Dysphagia Malnutrition . Secondary to stroke . NPO . On tube feedings @ 40cc/h . Albumin 2.2 . Not a PEG candidate given cirrhosis and ascites, so no long-term feeding option  Tobacco abuse  Current smoker  Smoking cessation counseling will be provided  Other Stroke Risk Factors  History of ETOH  abuse  Obesity, Body mass index is 32.44 kg/m., recommend weight loss, diet and exercise as appropriate   Substance abuse - UDS positive amphetamine  Other Active Problems  Mild anemia due to chronic disease- Hb - 11.1->11.7->11.1->10.2->11.6->11.1 (macrocytic indices)   Hypokalemia 3.0 - replaced - 3.3 replaced -> 3.5->3.3->3.5  PLAN  Palliative Care consulted 10/18/2018  Terminal extubation planned for today  Await family's arrival and I will be available for discussion with them if needed.  Discussed with critical care attending and patient's bedside nurse   Hospital day # 8  This patient is critically ill due to ICH, IVH and cerebral edema, liver cirrhosis, coagulopathy and hyperammonemia and at significant risk of neurological worsening, death form hematoma expansion, hydrocephalus, seizure, CNS infection, bleeding, encephalopathy. This patient's care requires constant monitoring of vital signs, hemodynamics, respiratory and cardiac monitoring, review of multiple databases, neurological assessment, discussion with family, other specialists and medical decision making of high complexity. I spent 35 minutes of neurocritical care time in the care of this patient. I had long discussion with daughter Shanda BumpsJessica over the phone yesterday, updated pt current condition, treatment plan and potential prognosis. She expressed understanding and appreciation.  Palliative care service on board and plan withdrawal of care today  Delia HeadyPramod Jarod Bozzo, MD Medical Director [facility] Pager: [phone] 10/19/2018 11:56 AM.      To contact Stroke Continuity provider, please refer to [email or web address]. After hours, contact General Neurology

## 2018-10-19 NOTE — Progress Notes (Addendum)
Was at bedside to meet with family regarding discussions that had earlier been had, 3 family members and 2 nurses in the room.  Comfort measures Withdrawal of aggressive ongoing measures based on patient's grim prognosis  Daughter was tearful Confrontational discussion with daughters significant other  I did make it known that we do not feel we can help him and his prognosis remains grim as discussed, comfort measures is appropriate in order not to continue prolonging his suffering.  They requested to spend more time with him at his bedside. There are no other questions.  Will place withdrawal of care orders once family is ready

## 2018-10-19 NOTE — Progress Notes (Signed)
NAME:  Christian Sanders, MRN:  [id number], DOB:  02/12/1961, LOS: 8 ADMISSION DATE:  10/03/2018, CONSULTATION DATE:  10/09/2018 REFERRING MD:  [name], CHIEF COMPLAINT:  ICH  Brief History   58 yo M found to have large L parietal hemorrage with intraventricular extension. Intubated at [facility] and [facility]. UDS + for amphetamines / benzo's.  FFP, Vit K given.  To OR 7/11 for placement of right frontal intraventricular catheter.   No significant changes in his status  Past Medical History  EtOH abuse Alcoholic Cirrhosis Hep C GERD GI Bleed Gastritis  BPD Panic Disorder PTSD Anxiety Depression Coagulopathy HTN Portal HTN BPH Chronic Pain   Significant Hospital Events   [facility]   Consults:  NSGY Neuro PCCM  Procedures:  ETT 7/10 >>  R lateral ventricle EVD 7/10>>  Significant Diagnostic Tests:  7/10 CT Head non-con> Large L posterior frontal/parietal lobe intra-axial hemorrhage measuring 54x57x3663mm, with associated edema and mass effect. Intraventricular extension with ventriculomegaly. Small L SAH. No SDH. Some Ventriculomegaly with edema at temporal and occipital horns. 11mm rightward midline shift. No uncal herniation. Partial effacement of suprasellar cistern. Blood present in 4th ventricle.  CXR 7/10 >> no acute cardiopulmonary abnormality. ETT at thoracic inlet and 7cm advancement is advised  CT Head (prior to EVD) 7/11 >> redemonstrated  L parietal bleed but now with extensive intraventricular extension and ventricular casting (personally reviewed) CT Head 7/12 >> EVD in good position in lateral ventricle. Ventricles appear decompressed and size of hemorrhage has diminished. 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  Stable shunted ventricular volume.    CT Head - Stable ventricular volumes post clamping (personal review)  Micro Data:  7/10 SARS CoV2 > negative  Antimicrobials:  Cefazolin 7/13 >>7/15 Ceftriaxone 7/15  Interim history/subjective:  Very brisk response to pain in lower extremity No response to sternal pressure  Objective   Blood pressure 103/61, pulse (!) 56, temperature 98.1 F (36.7 C), temperature source Axillary, resp. rate 19, height 6\' 2"  (1.88 m), weight 114.6 kg, SpO2 100 %.    Vent Mode: PRVC FiO2 (%):  [40 %] 40 % Set Rate:  [12 bmp] 12 bmp Vt Set:  [409[660 mL] 660 mL PEEP:  [5 cmH20] 5 cmH20 Pressure Support:  [5 cmH20-8 cmH20] 8 cmH20 Plateau Pressure:  [15 cmH20-20 cmH20] 15 cmH20   Intake/Output Summary (Last 24 hours) at 10/19/2018 0836 Last data filed at 10/19/2018 0700 Gross per 24 hour  Intake 1781.56 ml  Output 1525 ml  Net 256.56 ml   Filed Weights   10/17/18 0500 10/18/18 0325 10/19/18 0500  Weight: 110.5 kg 115.8 kg 114.6 kg    Examination: Ill-appearing, on vent Moist oral mucosa Sedated, on vent Synchronous with ventilator Clear breath sounds S1-S2 appreciated Bowel sounds appreciated Skin is warm and dry Jaundiced  Resolved Hospital Problem list    Assessment & Plan:   Intracerebral hemorrhage with multiple comorbidities -Grim prognosis -Goals of care discussions ongoing with family -Appreciate palliative care involvement -The anticipation is withdrawal of care after long family visited -Patient's brother requested to talk to physicians and will make myself available for this around noon -Discussed with neurology   Respiratory failure in the setting of intracerebral hemorrhage -Continue respiratory support on the vent -Continue goals of care  Large left parietal intracerebral hemorrhage with intraventricular extension -BP goals been maintained -Amphetamines may have provoked BP surgery leading to hemorrhage -No improvement in mental status at present -Did have an  EGD in place-removed 10/18/2018  Encephalopathy -Hepatic encephalopathy -Continue on lactulose and rifaximin -No changes in mental status noticed  Fever secondary to possible aspiration -To complete empiric antibiotic coverage for 7 days  Alcoholic cirrhosis Coagulopathy History of hepatitis C Hyperammonemia -Continue lactulose, continue rifaximin  Goals of care: -Appreciate palliative care involvement -Prognosis is grim -Multiple comorbidities makes possibility of functional recovery very poor -Not a candidate for PEG given cirrhosis and ascites -Continue to discuss with family   Best practice:  Diet: N.p.o. Pain/Anxiety/Delirium protocol (if indicated): PRN fentanyl, Precedex VAP protocol (if indicated): yes  DVT prophylaxis: SCD GI prophylaxis: Protonix Glucose control: SSI Mobility: BR  Code Status: DNR Family Communication: Family coming in this afternoon for possible withdrawal  We will also further discuss with patient's sibling  disposition: Continue ICU care, no escalation  Labs   CBC: Recent Labs  Lab 10/13/18 0342 10/14/18 0200 10/15/18 0551 10/15/18 1042 10/16/18 0536 10/16/18 1246 10/17/18 0547  WBC 20.6* 20.2* 13.2*  --  17.0*  --  10.4  HGB 11.1* 11.7* 11.1* 10.2* 11.6* 11.6* 11.1*  HCT 31.8* 33.6* 32.8* 30.0* 34.9* 34.0* 34.0*  MCV 107.8* 108.0* 110.4*  --  112.9*  --  113.7*  PLT 102* 110* 89*  --  94*  --  74*    Basic Metabolic Panel: Recent Labs  Lab 10/12/18 0938 10/12/18 1618  10/13/18 0342 10/13/18 1849 10/14/18 0200  10/15/18 0551  10/15/18 1042  10/16/18 0536  10/16/18 1246  10/17/18 0547 10/17/18 0948 10/17/18 1630 10/17/18 2057 10/18/18 0324  NA 139 142   < > 148*  146* 154* 153*   < > 155*   < > 160*   < > 157*   < > 160*   < > 157* 158* 159* 158* 157*  K  --   --    < > 3.6  --  3.0*  --  3.3*  --  3.5  --  3.3*  --  3.3*  --  3.5  --   --   --   --   CL  --   --   --  121*  --  124*  --  125*  --   --   --  127*   --   --   --  127*  --   --   --   --   CO2  --   --   --  19*  --  20*  --  22  --   --   --  23  --   --   --  23  --   --   --   --   GLUCOSE  --   --   --  139*  --  153*  --  110*  --   --   --  117*  --   --   --  129*  --   --   --   --   BUN  --   --   --  15  --  21*  --  30*  --   --   --  33*  --   --   --  39*  --   --   --   --   CREATININE  --   --   --  0.74  --  0.83  --  0.91  --   --   --  1.04  --   --   --  0.98  --   --   --   --   CALCIUM  --   --   --  8.8*  --  9.2  --  9.0  --   --   --  8.8*  --   --   --  8.8*  --   --   --   --   MG 1.9 1.9  --  2.0 2.0  --   --  2.2  --   --   --  2.4  --   --   --  2.7*  --   --   --   --   PHOS 2.9 2.4*  --  1.6* 2.0*  --   --  2.3*  --   --   --   --   --   --   --   --   --   --   --   --    < > = values in this interval not displayed.   GFR: Estimated Creatinine Clearance: 112 mL/min (by C-G formula based on SCr of 0.98 mg/dL). Recent Labs  Lab 10/14/18 0200 10/15/18 0551 10/16/18 0536 10/17/18 0547  WBC 20.2* 13.2* 17.0* 10.4    Liver Function Tests: Recent Labs  Lab 10/14/18 0200 10/15/18 0551 10/16/18 0536 10/17/18 0547  AST 60* 39 41 39  ALT 38 30 28 22   ALKPHOS 124 102 115 106  BILITOT 3.8* 3.4* 2.6* 2.0*  PROT 6.8 6.3* 6.5 6.0*  ALBUMIN 3.0* 2.5* 2.4* 2.2*   No results for input(s): LIPASE, AMYLASE in the last 168 hours. Recent Labs  Lab 10/14/18 0200 10/15/18 1000 10/16/18 0536 10/17/18 0547 10/18/18 0324  AMMONIA 103* 103* 156* 189* 112*    ABG    Component Value Date/Time   PHART 7.496 (H) 10/16/2018 1246   PCO2ART 30.2 (L) 10/16/2018 1246   PO2ART 88.0 10/16/2018 1246   HCO3 23.1 10/16/2018 1246   TCO2 24 10/16/2018 1246   ACIDBASEDEF 4.0 (H) 10-29-18 2327   O2SAT 97.0 10/16/2018 1246     Coagulation Profile: Recent Labs  Lab 10/14/18 0200 10/15/18 0551 10/16/18 0536 10/17/18 0547 10/18/18 0324  INR 1.8* 2.2* 1.8* 1.7* 1.8*    Cardiac Enzymes: No results for input(s):  CKTOTAL, CKMB, CKMBINDEX, TROPONINI in the last 168 hours.  HbA1C: Hgb A1c MFr Bld  Date/Time Value Ref Range Status  Oct 29, 2018 11:19 PM 5.0 4.8 - 5.6 % Final    Comment:    (NOTE) Pre diabetes:          5.7%-6.4% Diabetes:              >6.4% Glycemic control for   <7.0% adults with diabetes     CBG: Recent Labs  Lab 10/18/18 1526 10/18/18 1945 10/18/18 2325 10/19/18 0316 10/19/18 0730  GLUCAP 115* 116* 118* 107* 105*    CRITICAL CARE Performed by: Dasia Guerrier A Indi Willhite  The patient is critically ill with multiple organ system failure and requires high complexity decision making for assessment and support, frequent evaluation and titration of therapies, advanced monitoring, review of radiographic studies and interpretation of complex data.    Critical Care Time devoted to patient care services, exclusive of separately billable procedures, described in this note is 35 minutes.  Sherrilyn Rist, MD [place], PCCM Cell: [phone]

## 2018-10-19 NOTE — Progress Notes (Signed)
Extubated patient per  Laurin Coder, MD order.

## 2018-10-20 DIAGNOSIS — I619 Nontraumatic intracerebral hemorrhage, unspecified: Secondary | ICD-10-CM | POA: Insufficient documentation

## 2018-10-20 MED ORDER — LORAZEPAM 2 MG/ML IJ SOLN
2.0000 mg | Freq: Two times a day (BID) | INTRAMUSCULAR | Status: DC | PRN
  Administered 2018-10-20: 4 mg via INTRAVENOUS
  Filled 2018-10-20: qty 2

## 2018-10-20 MED ORDER — GLYCOPYRROLATE 1 MG PO TABS
1.0000 mg | ORAL_TABLET | ORAL | Status: DC | PRN
  Filled 2018-10-20: qty 1

## 2018-10-20 MED ORDER — LORAZEPAM 2 MG/ML IJ SOLN: 2.0000 mg | INTRAMUSCULAR | Status: DC | PRN

## 2018-10-20 MED ORDER — GLYCOPYRROLATE 0.2 MG/ML IJ SOLN: 0.2000 mg | INTRAMUSCULAR | Status: DC | PRN

## 2018-10-20 MED ORDER — GLYCOPYRROLATE 0.2 MG/ML IJ SOLN
0.2000 mg | INTRAMUSCULAR | Status: DC | PRN
  Administered 2018-10-20 (×3): 0.2 mg via INTRAVENOUS
  Filled 2018-10-20 (×3): qty 1

## 2018-10-20 NOTE — Progress Notes (Signed)
   NAME:  Christian Sanders, MRN:  [id number], DOB:  1960-04-30, LOS: 9 ADMISSION DATE:  October 16, 2018, CONSULTATION DATE:  16-Oct-2018 REFERRING MD:  [facility], CHIEF COMPLAINT:  ICH  Brief History   58 yo M found to have large L parietal hemorrage with intraventricular extension. Intubated at [facility] and transferred to [facility] for [place]. UDS + for amphetamines / benzo's.  FFP, Vit K given.  To OR 7/11 for placement of right frontal intraventricular catheter.   Made comfort measures 10/19/2018 Withdrawal of life-sustaining treatment performed 10/19/2018  Past Medical History  EtOH abuse Alcoholic Cirrhosis Hep C GERD GI Bleed Gastritis  BPD Panic Disorder PTSD Anxiety Depression Coagulopathy HTN Portal HTN BPH Chronic Pain   Significant Hospital Events   7/10 Transferred to [facility] for admission  Discussed with family members at bedside this morning 10/20/2018 Discussed with neurology 10/20/2018  Consults:  NSGY Neuro PCCM  Procedures:  ETT 7/10 >>  R lateral ventricle EVD 7/10>>  Significant Diagnostic Tests:  7/10 CT Head non-con> Large L posterior frontal/parietal lobe intra-axial hemorrhage measuring 54x57x41mm, with associated edema and mass effect. Intraventricular extension with ventriculomegaly. Small L SAH. No SDH. Some Ventriculomegaly with edema at temporal and occipital horns. 69mm rightward midline shift. No uncal herniation. Partial effacement of suprasellar cistern. Blood present in 4th ventricle.  CXR 7/10 >> no acute cardiopulmonary abnormality. ETT at thoracic inlet and 7cm advancement is advised  CT Head (prior to EVD) 7/11 >> redemonstrated  L parietal bleed but now with extensive intraventricular extension and ventricular casting (personally reviewed) CT Head 7/12 >> EVD in good position in lateral ventricle. Ventricles appear decompressed and size of hemorrhage has diminished. CTA Head/Neck 7/14 >> no visible explanation for lobar hemorrhage, 65mm aneurysm or infundibulum at  the R carotid terminus, 36mm pseudoaneurysm from the R ICA at the skull base, likely post traumatic in this location.  Stable shunted ventricular volume.   CT Head - Stable ventricular volumes post clamping (personal review)  Micro Data:  7/10 SARS CoV2 > negative  Antimicrobials:  Cefazolin 7/13 >>7/15 Ceftriaxone 7/15  Interim history/subjective:  Respiratory insufficiency  Objective   Comfort measures only  Examination: Off vent, tachypneic Gurgling respirations S1-S2 appreciated Rhonchi bilaterally   Resolved Hospital Problem list    Assessment & Plan:   Intracerebral hemorrhage, multiple comorbidities, brain initiation -Made comfort measures only -Withdrawal of life-sustaining interventions Discussed with family at bedside Issues with visitation-being discussed    Intracerebral hemorrhage with multiple comorbidities  Respiratory failure in the setting of intracerebral hemorrhage  Large left parietal intracerebral hemorrhage with intraventricular extension  Encephalopathy  Fever secondary to possible aspiration  Alcoholic cirrhosis Coagulopathy History of hepatitis C Hyperammonemia   Goals of care: -Comfort measures only  Best practice:  Comfort measures only Discussed with family at bedside

## 2018-10-20 NOTE — Progress Notes (Signed)
Transfer patient to palliative care floor  Currently on morphine PRN Ativan Robinul  Continue to manage per comfort measures protocol

## 2018-10-20 NOTE — Progress Notes (Signed)
STROKE TEAM PROGRESS NOTE   SUBJECTIVE (INTERVAL HISTORY) Patient was made comfort care yesterday upon discussion with family and extubated.  He is presently on morphine drip and appears to be in mild respiratory distress.  There are multiple family members at the bedside.  Family has several questions about his care which answered. OBJECTIVE Vitals:   10/20/18 0700 10/20/18 0800 10/20/18 0900 10/20/18 1035  BP:    134/88  Pulse: (!) 115 (!) 124 (!) 149 (!) 135  Resp: 17 (!) 22 (!) 26   Temp:    (!) 102.7 F (39.3 C)  TempSrc:    Oral  SpO2: (!) 65% (!) 56% (!) 64% (!) 81%  Weight:      Height:        CBC:  Recent Labs  Lab 10/16/18 0536 10/16/18 1246 10/17/18 0547  WBC 17.0*  --  10.4  HGB 11.6* 11.6* 11.1*  HCT 34.9* 34.0* 34.0*  MCV 112.9*  --  113.7*  PLT 94*  --  74*    Basic Metabolic Panel:  Recent Labs  Lab 10/13/18 1849  10/15/18 0551  10/16/18 0536  10/16/18 1246  10/17/18 0547  10/17/18 2057 10/18/18 0324  NA 154*   < > 155*   < > 157*   < > 160*   < > 157*   < > 158* 157*  K  --    < > 3.3*   < > 3.3*  --  3.3*  --  3.5  --   --   --   CL  --    < > 125*  --  127*  --   --   --  127*  --   --   --   CO2  --    < > 22  --  23  --   --   --  23  --   --   --   GLUCOSE  --    < > 110*  --  117*  --   --   --  129*  --   --   --   BUN  --    < > 30*  --  33*  --   --   --  39*  --   --   --   CREATININE  --    < > 0.91  --  1.04  --   --   --  0.98  --   --   --   CALCIUM  --    < > 9.0  --  8.8*  --   --   --  8.8*  --   --   --   MG 2.0  --  2.2  --  2.4  --   --   --  2.7*  --   --   --   PHOS 2.0*  --  2.3*  --   --   --   --   --   --   --   --   --    < > = values in this interval not displayed.    Lipid Panel:     Component Value Date/Time   CHOL 144 10/14/2018 0200   TRIG 206 (H) 10/16/2018 0759   HDL 66 10/14/2018 0200   CHOLHDL 2.2 10/14/2018 0200   VLDL 8 10/14/2018 0200   LDLCALC 70 10/14/2018 0200   HgbA1c:  Lab Results  Component  Value Date  HGBA1C 5.0 10/23/2018   Urine Drug Screen:     Component Value Date/Time   LABOPIA NONE DETECTED 10/10/2018 2117   COCAINSCRNUR NONE DETECTED 10/12/2018 2117   COCAINSCRNUR NEG 03/17/2014 1456   LABBENZ POSITIVE (A) 10/10/2018 2117   AMPHETMU POSITIVE (A) 10/20/2018 2117   THCU NONE DETECTED 10/20/2018 2117   LABBARB NONE DETECTED 10/27/2018 2117    Alcohol Level     Component Value Date/Time   ETH <10 10/09/2018 2048    IMAGING Ct Head Wo Contrast  10/17/2018 1. Unchanged lobar, intraventricular, and subarachnoid clot. 2. EVD with stable normal ventricular volume. 3. 6 mm of midline shift.    Ct Angio Head W Or Wo Contrast Ct Angio Neck W Or Wo Contrast 10/15/2018 1. No visible explanation for lobar hemorrhage. 2. 3 mm aneurysm or infundibulum at the right carotid terminus. 3. 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 4. Atherosclerosis without flow limiting stenosis of major vessels. 5. Stable shunted ventricular volume.  No repeat hemorrhage.    Ct Head Wo Contrast  10/13/2018 1. Expected evolution of large left posterior frontal and parietal intraparenchymal hemorrhage. 2. Decompression of the ventricles following ventriculostomy tube placement. 3. Blood products previously seen in the fourth ventricle have cleared. 4. No new hemorrhage. Ct Head Wo Contrast  10/12/2018 1. No significant interval change in size and morphology of large intraparenchymal hematoma centered at the left frontoparietal region. Associated regional mass effect with 12 mm of left-to-right midline shift, relatively similar.  2. Intraventricular extension with large volume intraventricular hemorrhage, increased from previous exam. Slightly worsened associated hydrocephalus.  3. No other new acute intracranial abnormality.   Ct Head Code Stroke Wo Contrast 10/14/2018 Large left frontal/parietal lobe junction intra-axial hemorrhage with  estimated blood volume of 97 mL. Regional edema and mass effect with 11 mm of rightward midline shift. Intraventricular extension with moderate ventriculomegaly and transependymal edema. Small volume left convexity subarachnoid hemorrhage.  Dg Chest Portable 1 View 10/14/2018 1. Low lung volumes with bibasilar atelectasis versus airspace consolidation. 2. Support apparatus as described. 10/08/2018 1. The ETT terminates just above the thoracic inlet. Recommend advancing 7 cm. 21   Transthoracic Echocardiogram  10/13/2018  1. The left ventricle has hyperdynamic systolic function, with an ejection fraction of >65%. The cavity size was normal. There is mildly increased left ventricular wall thickness. Left ventricular diastolic Doppler parameters are consistent with  impaired relaxation.  2. The right ventricle has normal systolic function. The cavity was normal. There is no increase in right ventricular wall thickness.  3. The interatrial septum was not assessed.   PHYSICAL EXAM Temp:  [102.7 F (39.3 C)] 102.7 F (39.3 C) (07/19 1035) Pulse Rate:  [66-149] 135 (07/19 1035) Resp:  [16-26] 26 (07/19 0900) BP: (115-150)/(59-88) 134/88 (07/19 1035) SpO2:  [56 %-97 %] 81 % (07/19 1035) FiO2 (%):  [40 %] 40 % (07/18 1524)  General - Well nourished, well developed, middle-aged Caucasian male in mild respiratory distress.  He is on morphine drip.. Sclera jaundice.   Ophthalmologic - fundi not visualized due to noncooperation.  Cardiovascular - Regular rate and rhythm.  Neuro -patient is sedated on morphine drip..  Eyes are closed., right eye closed, left eye half closed, not following commands. With forced eye opening, eyes in mid position, not blinking to visual threat, doll's eyes sluggish, not tracking, pupils 3 mm, reactive to light. Corneal reflex absent bilaterally, gag and cough present but weak. Breathing  over the vent.  Facial symmetry not able to test due to ET tube.  Tongue protrusion  not cooperative. On pain stimulation, slight withdraw on all extremities. Bilateral positive babinski. Sensation, coordination and gait not tested.   ASSESSMENT/PLAN [name] is a 58 y.o. male with history of  alcohol abuse, liver cirrhosis, hep C, Bipolar, hypertension including portal hypertension presented to [facility] emergency department after being found unresponsive by his wife. He did not receive IV t-PA due to hemorrhage. NS consult -> risks of surgery outweighed benefits.  ICH - Large left parietal ICH and IVH with midline shift - could be HTN in the setting of coagulopathy and thrombocytopenia due to cirrhosis   CT head 2018-10-31 - Large left frontal/parietal lobe junction intra-axial hemorrhage with regional edema and mass effect. Intraventricular extension with moderate ventriculomegaly and transependymal edema. Small volume left convexity subarachnoid hemorrhage.  CT Head 10/12/18 - No significant interval change of hematoma and MLS. Intraventricular extension with large volume intraventricular hemorrhage, increased from previous exam. Slightly worsened associated hydrocephalus.   CT head 10/13/2018 - decreased IVH and stable hematoma  CTA H&N 10/15/18 neg for source of ICH  CT head 7/16 ICH stable. EVD stable. Shift 49mm  2D Echo - EF >65%. No source of embolus   [employer] Virus 2  - negative  LDL - 70  HgbA1c - 5.0  UDS - positive for amphetamines and benzodiazepines (Pt is on Xanax )  VTE prophylaxis - SCDs  No antithrombotic prior to admission, now on No antithrombotic due to hemorrhage  Therapy recommendations:  pending  Disposition:  Pending, palliative care involved for [place] discussion  Hydrocephalus  CT repeat 10/12/2018 increased ventriculomegaly and hydrocephalus  Status post EVD  Neurosurgery on board  CT repeat 10/13/2018 decrease IVH  CT head 7/14 stable shunted volume  On Keppra 500 twice daily  on ancef 7/13 -> Rocephin 7/15 - Day  #4  Currently EVD clamped today  CT head 7/16 ICH stable. EVD stable. Shift 42mm  EVD removed 7/16. Ok to remove staples and stitches in 1.5 weeks  Neurosurgery signed off  Cerebral edema  CT head showed mass-effect with midline shift  off 3% saline -> now on free water  Sodium 154-157-160-156-157->155->159->157  Sodium goal 150-155  Sodium every 6 hours  On 1/2 NS @ 50  CT head 10/13/2018 improved midline shift down from 12->6 mm  CT head 7/14 stable shunted volume  CT head 7/16 ICH stable. EVD stable. Shift 93mm  Decompensated liver failure Coagulopathy  Hyperammonemia  Thrombocytopenia  Hepatic encephalopathy Alcoholic Liver cirrhosis Hepatitis C  INR 1.4 s/p FFP -> 1.7 s/p FFP ->1.8 FFP and Vit K->2.2->1.8->1.7->1.8  INR goal < 1.5  Ammonia level 55-> 98->103->156->189->112 on increased lactulose    Alb 3.0, AST 60, TBIL 3.8, DBIL 0.8, IBILI 3.0  Thrombocytopenia 128->114->102->110->89->94->74  Monitor INR and ammonia level daily  History of varices  History of hepatitis C  History of portal hypertension  History of alcohol abuse  On rifampin  CCM on board  Fever Possible aspiration  T-max 101.8->101.1->100.8->99.9->101.2->afebrile  WBC 9.0->9.7->20.6->20.2->13.2->17.0->10.4  on ancef 7/13 -> Rocephin 7/15 -  Day #4  CCM on board  Respiratory Cx - abundant streptococcus pneumoniaa  CXR bibasilar atx  On abx x 7 day course  Acute Respiratory Failure in setting of [employer]  CCM on board  Still on vent  Sedation switched from propofol to precedex  Also on seroquel 50mg  bid  Hypertension  Stable . BP  goal < 160 mm Hg . Treated with Cleviprex now off . Not on BP meds . Long-term BP goal normotensive  Dysphagia Malnutrition . Secondary to stroke . NPO . On tube feedings @ 40cc/h . Albumin 2.2 . Not a PEG candidate given cirrhosis and ascites, so no long-term feeding option  Tobacco abuse  Current smoker  Smoking  cessation counseling will be provided  Other Stroke Risk Factors  History of ETOH abuse  Obesity, Body mass index is 32.44 kg/m., recommend weight loss, diet and exercise as appropriate   Substance abuse - UDS positive amphetamine  Other Active Problems  Mild anemia due to chronic disease- Hb - 11.1->11.7->11.1->10.2->11.6->11.1 (macrocytic indices)   Hypokalemia 3.0 - replaced - 3.3 replaced -> 3.5->3.3->3.5  PLAN  Palliative Care consulted 10/18/2018  Terminal extubation done on 10/19/18    Discussed with patient's fianc, mother and 2 sisters at the bedside and answered questions about his prognosis and plans for comfort care.  Also discussed with with critical care attending and patient's bedside nurse  Recommend transfer to 6 [address]. palliative care floor for ongoing comfort care needs.   Hospital day # 9  This patient is critically ill due to ICH, IVH and cerebral edema, liver cirrhosis, coagulopathy and hyperammonemia and at significant risk of neurological worsening, death form hematoma expansion, hydrocephalus, seizure, CNS infection, bleeding, encephalopathy. This patient's care requires constant monitoring of vital signs, hemodynamics, respiratory and cardiac monitoring, review of multiple databases, neurological assessment, discussion with family, other specialists and medical decision making of high complexity. I spent 30 minutes of neurocritical care time in the care of this patient.  Stroke team will sign off.  Kindly call for questions.  Discussed with critical care attending who is in agreement with plan  Delia HeadyPramod Sethi, MD Medical Director [facility] Pager: [phone] 10/20/2018 12:04 PM.      To contact Stroke Continuity provider, please refer to [email or web address]. After hours, contact General Neurology

## 2018-10-20 NOTE — Progress Notes (Signed)
Pt condition continues to deteriorate. Daughter contacted with update. MD contacted for order changes as pt comfort levels not adequate at maximum morphine drip rate,waiting for response

## 2018-10-20 NOTE — Progress Notes (Signed)
Patient maxed out on morphine We will increase frequency of Ativan to nightly to keep patient comfortable

## 2018-10-20 NOTE — Progress Notes (Signed)
Prn ativan changed to q1hr prn by MD  for increased comfort. 4mg  iv ativan  administered,family at bedside and agreeable to changes

## 2018-10-22 LAB — CULTURE, RESPIRATORY W GRAM STAIN

## 2018-10-29 ENCOUNTER — Telehealth: Payer: Self-pay | Admitting: *Deleted

## 2018-10-29 NOTE — Telephone Encounter (Signed)
Received original signed D/C, [facility] Notified for pick up.

## 2018-11-02 NOTE — Progress Notes (Signed)
   October 25, 2018 0420  Attending [place]  Attending Physician Notified Y  Attending Physician (First and Last Name) Bernarda Caffey  Will the above attending physician sign death certificate? No  Post Mortem Checklist  Date of Death 10-25-2018  Time of Death 0405  Pronounced By Abran Richard R  Next of kin notified Yes  Name of next of kin notified of death 45  Contact Person's Relationship to Patient Daughter  Contact Person's Phone Number [phone]  Was the patient a No Code Blue or a Limited Code Blue? No  Did the patient die unattended? No  Patient restrained? Not applicable  Height 6\' 2"  (1.88 m)  Weight 114.6 kg  [place] [facility]  Notification Date October 25, 2018  Notification Time [place] [facility] Number [phone]  Is patient a potential donor? N  Autopsy  Autopsy requested by N/A  Patient Belongings/Medications Returned  Patient belongings from bedside/safe/pharmacy returned  Yes  Valuables returned to? Ranelle Oyster  Specify valuables returned necklace  Medical Examiner  Is this a medical examiner's case? N

## 2018-11-02 NOTE — Death Summary Note (Signed)
DEATH SUMMARY   Patient Details  Name: Becker Christopher MRN: [id number] DOB: [birth year]  Admission/Discharge Information   Admit Date:  10-18-18  Date of Death: Date of Death: 2018/10/28  Time of Death: Time of Death: 0405  Length of Stay: July 19, 2022  Referring Physician: Denyce Robert, FNP   Reason(s) for Hospitalization  Patient presented to the hospital with right-sided weakness and aphasia Had progressive decline in mental status  Diagnoses  Preliminary cause of death:   Cardiorespiratory arrest Large left parietal intracerebral hemorrhage with intra-ventricular extension  Secondary Diagnoses (including complications and co-morbidities):  Active Problems:   Intracranial hemorrhage (HCC)   Goals of care, counseling/discussion   Palliative care by specialist   Intracerebral hemorrhage Alcoholic cirrhosis Coagulopathy Hyperammonemia PTSD Alcohol use disorder Chronic pain syndrome Brief Hospital Course (including significant findings, care, treatment, and services provided and events leading to death)  Whitney Bingaman is a 58 y.o. year old male who presented to the hospital with right-sided weakness and aphasia, progressive decline in mental status Found to have a large parietal bleed with intraventricular extension on 10/18/22 There was extension noted on 7/11-EVD placed There was ventricular decompression noted on repeat CT Patient not making any neurological recovery Started on medication for encephalopathy-possible hepatic encephalopathy with patient not showing any significant improvement with treatment With no neurological improvement, outlook noted to be poor.  Patient would not be a candidate for a PEG tube secondary to cirrhosis and ascites-comfort measures felt to be a reasonable option and this was discussed with the family Palliative care was consulted to be involved in the care Decision was made to withdraw life-sustaining treatment and make patient comfortable on  7/18 Patient expired on 2018-10-28 at 0405  Pertinent Labs and Studies  Significant Diagnostic Studies Ct Angio Head W Or Wo Contrast  Result Date: 10/15/2018 CLINICAL DATA:  Follow-up intracranial hemorrhage EXAM: CT ANGIOGRAPHY HEAD AND NECK TECHNIQUE: Multidetector CT imaging of the head and neck was performed using the standard protocol during bolus administration of intravenous contrast. Multiplanar CT image reconstructions and MIPs were obtained to evaluate the vascular anatomy. Carotid stenosis measurements (when applicable) are obtained utilizing NASCET criteria, using the distal internal carotid diameter as the denominator. CONTRAST:  55mL OMNIPAQUE IOHEXOL 350 MG/ML SOLN COMPARISON:  Head CT from 2 days ago FINDINGS: CT HEAD FINDINGS Brain: Unchanged left posterior frontal and parietal parenchymal hemorrhage with rim of edema. The shape and extent is unchanged. Decreasing density of intraventricular clot, although still extensive at the level of the lateral ventricles. Right frontal ventriculostomy with tip near the foramina Monroe. Stable ventricular volume. Midline shift measures 6 mm from left to right, essentially stable. Small volume subarachnoid hemorrhage mainly along the right cerebral convexity, stable. Negative for interval infarct. Vascular: See below Skull: Negative Sinuses: Intubation with nasopharyngeal fluid/debris. Essentially stable right maxillary and ethmoid sinus opacification Orbits: Remote orbital rim repair on the left.  No acute finding Review of the MIP images confirms the above findings CTA NECK FINDINGS Aortic arch: Normal.  Three vessel branching Right carotid system: No flow limiting stenosis or plaque ulceration. Tortuous distal ICA with pseudoaneurysm at the skull base measuring 8 mm in diameter. Aneurysm projects posteriorly and is smoothly contoured. Left carotid system: Atherosclerotic plaque without ulceration or flow limiting stenosis at the ICA bulb. Vertebral  arteries: No proximal subclavian stenosis or ulceration. Both vertebral arteries are smooth and widely patent Skeleton: No acute or aggressive finding Other neck: Intubation with unremarkable tube positioning Upper chest: Minimally visualized opacity in  the dependent left lung, likely atelectasis Review of the MIP images confirms the above findings CTA HEAD FINDINGS Anterior circulation: Atherosclerotic plaque on the carotid siphons with mild narrowing at the left paraclinoid ICA. No evidence of underlying vascular malformation or aneurysm at the level of lobar hemorrhage. There is a 2.7 mm aneurysm or infundibulum projecting medially and posteriorly at the right carotid terminus. No vessel beading or major branch occlusion Posterior circulation: The vertebral and basilar arteries are smooth and diffusely patent atheromatous irregularity of the posterior cerebral arteries with mild borderline moderate narrowing at the right P2 segment. Venous sinuses: Patent as permitted by contrast timing Anatomic variants: None significant Delayed phase: Not obtained Review of the MIP images confirms the above findings IMPRESSION: 1. No visible explanation for lobar hemorrhage. 2. 3 mm aneurysm or infundibulum at the right carotid terminus. 3. 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 4. Atherosclerosis without flow limiting stenosis of major vessels. 5. Stable shunted ventricular volume.  No repeat hemorrhage. Electronically Signed   By: Marnee SpringJonathon  Watts M.D.   On: 10/15/2018 08:09   Ct Head Wo Contrast  Result Date: 10/17/2018 CLINICAL DATA:  Follow-up known intracranial hemorrhage EXAM: CT HEAD WITHOUT CONTRAST TECHNIQUE: Contiguous axial images were obtained from the base of the skull through the vertex without intravenous contrast. COMPARISON:  Two days ago FINDINGS: Brain: Unchanged shape and extent of left parietal lobar hemorrhage centered in the white matter  measuring up to 6 x 5 x 4 cm on axial slices. There is a rim of vasogenic edema. Intraventricular extension with clot in the lateral ventricles that is unchanged. There is a right frontal EVD in stable position. Stable normal ventricular volume. Minimal hemorrhage along the catheter is unchanged. Mild patchy subarachnoid hemorrhage along the convexities is unchanged. No interval infarct. Unchanged midline shift when allowing for head tilting, measuring 6 mm today. Vascular: No acute finding Skull: No acute finding Sinuses/Orbits: Remote left orbital rim fractures and orbital floor fracture. IMPRESSION: 1. Unchanged lobar, intraventricular, and subarachnoid clot. 2. EVD with stable normal ventricular volume. 3. 6 mm of midline shift. Electronically Signed   By: Marnee SpringJonathon  Watts M.D.   On: 10/17/2018 06:46   Ct Head Wo Contrast  Result Date: 10/13/2018 CLINICAL DATA:  Intracranial hemorrhage, known, follow-up. Drain in place. EXAM: CT HEAD WITHOUT CONTRAST TECHNIQUE: Contiguous axial images were obtained from the base of the skull through the vertex without intravenous contrast. COMPARISON:  CT head without contrast 10/25/2018 and 10/12/2018 FINDINGS: Brain: There is expected evolution of large left posterior frontal and parietal parenchymal hemorrhage. Hemorrhage now measures 6.9 x 6.5 x 3.2 cm. Extension into the lateral ventricles is again noted. There is layering blood in the lateral ventricles. Minimal blood is seen into the fourth ventricle, significantly improved. There is decompression of the lateral ventricles following ventriculostomy placement. No new hemorrhage is midline shift is improving, now measuring 6 mm. Vascular: No hyperdense vessel or unexpected calcification. Skull: Right frontal burr hole is present. Calvarium is otherwise intact. Sinuses/Orbits: Postsurgical changes are noted the left anterior maxilla. Fluid in the right maxillary sinus is improved. Scattered ethmoid opacification is again  seen. IMPRESSION: 1. Expected evolution of large left posterior frontal and parietal intraparenchymal hemorrhage. 2. Decompression of the ventricles following ventriculostomy tube placement. 3. Blood products previously seen in the fourth ventricle have cleared. 4. No new hemorrhage. Electronically Signed   By: Marin Robertshristopher  Mattern M.D.   On: 10/13/2018 11:19  Ct Head Wo Contrast  Result Date: 10/12/2018 CLINICAL DATA:  Follow-up examination for acute intracranial hemorrhage. EXAM: CT HEAD WITHOUT CONTRAST TECHNIQUE: Contiguous axial images were obtained from the base of the skull through the vertex without intravenous contrast. COMPARISON:  Prior CT from 10-26-2018. FINDINGS: Brain: Large intraparenchymal hematoma centered at the left frontoparietal region, overall similar in size and morphology as compared to previous exam. Surrounding low-density vasogenic edema with regional mass effect. Associated intraventricular extension with intraventricular hemorrhage within the lateral, third, and fourth ventricles. Degree of intraventricular hemorrhage is increased from previous. Temporal horn dilatation compatible with associated hydrocephalus, slightly worsened from previous. Associated left-to-right shift of up to approximately 12 mm, relatively similar. Basilar cistern crowding without transtentorial herniation, similar to previous. No other new acute intracranial hemorrhage. No acute large vessel territory infarct. No extra-axial fluid collection. Vascular: No appreciable hyperdense vessel. Subarachnoid hemorrhage extends along the course of the left MCA, similar to previous. Skull: Scalp soft tissues and calvarium demonstrate no acute finding. Sinuses/Orbits: Globes and orbital soft tissues demonstrate no acute finding. Sequelae of prior ORIF seen at the left face. Scattered mucosal thickening with air-fluid levels within the right maxillary and sphenoid sinuses. Endotracheal and enteric tubes partially  visualized. Mastoid air cells remain clear. Other: None. IMPRESSION: 1. No significant interval change in size and morphology of large intraparenchymal hematoma centered at the left frontoparietal region. Associated regional mass effect with 12 mm of left-to-right midline shift, relatively similar. 2. Intraventricular extension with large volume intraventricular hemorrhage, increased from previous exam. Slightly worsened associated hydrocephalus. 3. No other new acute intracranial abnormality. Electronically Signed   By: Rise Mu M.D.   On: 10/12/2018 03:09   Ct Angio Neck W Or Wo Contrast  Result Date: 10/15/2018 CLINICAL DATA:  Follow-up intracranial hemorrhage EXAM: CT ANGIOGRAPHY HEAD AND NECK TECHNIQUE: Multidetector CT imaging of the head and neck was performed using the standard protocol during bolus administration of intravenous contrast. Multiplanar CT image reconstructions and MIPs were obtained to evaluate the vascular anatomy. Carotid stenosis measurements (when applicable) are obtained utilizing NASCET criteria, using the distal internal carotid diameter as the denominator. CONTRAST:  75mL OMNIPAQUE IOHEXOL 350 MG/ML SOLN COMPARISON:  Head CT from 2 days ago FINDINGS: CT HEAD FINDINGS Brain: Unchanged left posterior frontal and parietal parenchymal hemorrhage with rim of edema. The shape and extent is unchanged. Decreasing density of intraventricular clot, although still extensive at the level of the lateral ventricles. Right frontal ventriculostomy with tip near the foramina Monroe. Stable ventricular volume. Midline shift measures 6 mm from left to right, essentially stable. Small volume subarachnoid hemorrhage mainly along the right cerebral convexity, stable. Negative for interval infarct. Vascular: See below Skull: Negative Sinuses: Intubation with nasopharyngeal fluid/debris. Essentially stable right maxillary and ethmoid sinus opacification Orbits: Remote orbital rim repair on the  left.  No acute finding Review of the MIP images confirms the above findings CTA NECK FINDINGS Aortic arch: Normal.  Three vessel branching Right carotid system: No flow limiting stenosis or plaque ulceration. Tortuous distal ICA with pseudoaneurysm at the skull base measuring 8 mm in diameter. Aneurysm projects posteriorly and is smoothly contoured. Left carotid system: Atherosclerotic plaque without ulceration or flow limiting stenosis at the ICA bulb. Vertebral arteries: No proximal subclavian stenosis or ulceration. Both vertebral arteries are smooth and widely patent Skeleton: No acute or aggressive finding Other neck: Intubation with unremarkable tube positioning Upper chest: Minimally visualized opacity in the dependent left lung, likely atelectasis Review of the MIP images  confirms the above findings CTA HEAD FINDINGS Anterior circulation: Atherosclerotic plaque on the carotid siphons with mild narrowing at the left paraclinoid ICA. No evidence of underlying vascular malformation or aneurysm at the level of lobar hemorrhage. There is a 2.7 mm aneurysm or infundibulum projecting medially and posteriorly at the right carotid terminus. No vessel beading or major branch occlusion Posterior circulation: The vertebral and basilar arteries are smooth and diffusely patent atheromatous irregularity of the posterior cerebral arteries with mild borderline moderate narrowing at the right P2 segment. Venous sinuses: Patent as permitted by contrast timing Anatomic variants: None significant Delayed phase: Not obtained Review of the MIP images confirms the above findings IMPRESSION: 1. No visible explanation for lobar hemorrhage. 2. 3 mm aneurysm or infundibulum at the right carotid terminus. 3. 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 4. Atherosclerosis without flow limiting stenosis of major vessels. 5. Stable shunted ventricular volume.  No repeat hemorrhage.  Electronically Signed   By: Marnee SpringJonathon  Watts M.D.   On: 10/15/2018 08:09   Dg Chest Port 1 View  Result Date: 10/14/2018 CLINICAL DATA:  Respiratory failure. EXAM: PORTABLE CHEST 1 VIEW COMPARISON:  October 11, 2018 FINDINGS: Endotracheal tube in satisfactory position. Enteric catheter transverses the thorax. Cardiomediastinal silhouette is normal. Mediastinal contours appear intact. There is no evidence of pneumothorax. Low lung volumes with bibasilar atelectasis versus airspace consolidation. Osseous structures are without acute abnormality. Soft tissues are grossly normal. IMPRESSION: 1. Low lung volumes with bibasilar atelectasis versus airspace consolidation. 2. Support apparatus as described. Electronically Signed   By: Ted Mcalpineobrinka  Dimitrova M.D.   On: 10/14/2018 09:02   Dg Chest Portable 1 View  Result Date: 10/24/2018 CLINICAL DATA:  Evaluate ETT placement EXAM: PORTABLE CHEST 1 VIEW COMPARISON:  October 29, 2016 FINDINGS: The ETT terminates just above the thoracic inlet at 11 cm above the carina. Recommend advancing 7 cm. No pneumothorax. The cardiomediastinal silhouette is unremarkable given technique. No focal infiltrates. IMPRESSION: 1. The ETT terminates just above the thoracic inlet. Recommend advancing 7 cm. Findings called to Dr. Estell HarpinZammit. Electronically Signed   By: Gerome Samavid  Williams III M.D   On: 10/26/2018 21:21   Ct Head Code Stroke Wo Contrast  Result Date: 10/28/2018 CLINICAL DATA:  Code stroke. 58 year old male altered mental status, found unresponsive tonight. Last known normal 1 day ago. EXAM: CT HEAD WITHOUT CONTRAST TECHNIQUE: Contiguous axial images were obtained from the base of the skull through the vertex without intravenous contrast. COMPARISON:  Head CT 05/03/2017. FINDINGS: Brain: Large left posterior frontal/parietal lobe region intra-axial hemorrhage encompassing 54 x 57 x 63 millimeters (AP by transverse by CC) for an estimated blood volume of 97 milliliters. Regional edema and  mass effect. Intraventricular extension with moderate ventriculomegaly. There is also a small volume of left convexity subarachnoid hemorrhage. No subdural hematoma identified. There is mild-to-moderate ventriculomegaly with evidence of transependymal edema at the temporal and occipital horns. There is rightward midline shift of 11 millimeters. No uncal herniation at this time. There is partial effacement of the suprasellar cistern. The remaining basilar cisterns are patent. There is blood in the 4th ventricle. Outside the area of hemorrhage gray-white matter differentiation is preserved. Vascular: Mild Calcified atherosclerosis at the skull base. Skull: No acute osseous abnormality identified. Chronic left orbital floor fracture and previous left orbital wall and maxilla reconstruction. Sinuses/Orbits: Moderate new right ethmoid and maxillary sinus mucosal thickening. Other visible paranasal sinuses and mastoids are stable. Other: No acute  orbit or scalp soft tissue findings. ASPECTS [facility]([place] Stroke Program Early CT Score) Total score (0-10 with 10 being normal): Not applicable, acute hemorrhage. IMPRESSION: Large left frontal/parietal lobe junction intra-axial hemorrhage with estimated blood volume of 97 mL. Regional edema and mass effect with 11 mm of rightward midline shift. Intraventricular extension with moderate ventriculomegaly and transependymal edema. Small volume left convexity subarachnoid hemorrhage. Critical Value/emergent results were called by telephone at the time of interpretation on July 14, 2018 at 8:54 pm to Dr. Bethann BerkshireJOSEPH ZAMMIT , who verbally acknowledged these results. Electronically Signed   By: Odessa FlemingH  Hall M.D.   On: 0April 12, 2020 20:55   [place] Ekg Site Rite  Result Date: 10/13/2018 If Site Rite image not attached, placement could not be confirmed due to current cardiac rhythm.   Microbiology Recent Results (from the past 240 hour(s))  Culture, respiratory (non-expectorated)     Status: None    Collection Time: 10/14/18 10:59 AM   Specimen: Tracheal Aspirate; Respiratory  Result Value Ref Range Status   Specimen Description TRACHEAL ASPIRATE  Final   Special Requests NONE  Final   Gram Stain   Final    RARE WBC PRESENT, PREDOMINANTLY PMN MODERATE GRAM NEGATIVE RODS MODERATE GRAM POSITIVE COCCI IN PAIRS RARE GRAM POSITIVE RODS Performed at [facility], [address] [place], [place] [id number]    Culture ABUNDANT STREPTOCOCCUS PNEUMONIAE  Final   Report Status 10/22/2018 FINAL  Final   Organism ID, Bacteria STREPTOCOCCUS PNEUMONIAE  Final      Susceptibility   Streptococcus pneumoniae - MIC*    ERYTHROMYCIN >=8 RESISTANT Resistant     LEVOFLOXACIN 0.5 SENSITIVE Sensitive     VANCOMYCIN <=0.12 SENSITIVE Sensitive     PENICILLIN (non-meningitis) <=0.06 SENSITIVE Sensitive     CEFTRIAXONE (non-meningitis) <=0.12 SENSITIVE Sensitive     * ABUNDANT STREPTOCOCCUS PNEUMONIAE    Lab Basic Metabolic Panel: Recent Labs  Lab 10/16/18 0536  10/16/18 1246  10/17/18 0547 10/17/18 0948 10/17/18 1630 10/17/18 2057 10/18/18 0324  NA 157*   < > 160*   < > 157* 158* 159* 158* 157*  K 3.3*  --  3.3*  --  3.5  --   --   --   --   CL 127*  --   --   --  127*  --   --   --   --   CO2 23  --   --   --  23  --   --   --   --   GLUCOSE 117*  --   --   --  129*  --   --   --   --   BUN 33*  --   --   --  39*  --   --   --   --   CREATININE 1.04  --   --   --  0.98  --   --   --   --   CALCIUM 8.8*  --   --   --  8.8*  --   --   --   --   MG 2.4  --   --   --  2.7*  --   --   --   --    < > = values in this interval not displayed.   Liver Function Tests: Recent Labs  Lab 10/16/18 0536 10/17/18 0547  AST 41 39  ALT 28 22  ALKPHOS 115 106  BILITOT 2.6* 2.0*  PROT  6.5 6.0*  ALBUMIN 2.4* 2.2*   No results for input(s): LIPASE, AMYLASE in the last 168 hours. Recent Labs  Lab 10/16/18 0536 10/17/18 0547 10/18/18 0324  AMMONIA 156* 189* 112*   CBC: Recent Labs  Lab  10/16/18 0536 10/16/18 1246 10/17/18 0547  WBC 17.0*  --  10.4  HGB 11.6* 11.6* 11.1*  HCT 34.9* 34.0* 34.0*  MCV 112.9*  --  113.7*  PLT 94*  --  74*   Cardiac Enzymes: No results for input(s): CKTOTAL, CKMB, CKMBINDEX, TROPONINI in the last 168 hours. Sepsis Labs: Recent Labs  Lab 10/16/18 0536 10/17/18 0547  WBC 17.0* 10.4    Procedures/Operations  EVD Endotracheal intubation   Petula Rotolo A Rushil Kimbrell 10/22/2018, 5:57 PM

## 2018-11-02 NOTE — Progress Notes (Signed)
Waiting for pt's daughter, Parag Dorton, to come.  Funeral home details undecided yet at this time.

## 2018-11-02 NOTE — Progress Notes (Signed)
Patient passed away this AM  Spoke with patients daughter at bedside,( both herself and her significant other who are at bedside were impaired, very fidgety)  She is requesting an autopsy because of concerns regarding if anything was done prior to coming in to the hospital that may have contributed to his death.  I did explain our findings that led to [place] transitioning patient to comfort measurres -Large intraparenchymal hematoma with mass-effect, worsening hydrocephalus Multiple comorbidities including chronic liver disease-with decompensated liver failure, coagulopathy, hepatic encephalopathy, respiratory failure  Toxicology positive for amphetamines and benzodiazepines(patient on Xanax)  Patient prognosis determined to be grim was the reasoning for making him comfort measures only Palliative care was involved with goals of care discussions  She is insistent on having an autopsy done We will contact medical examiner and proceed from there  At the end of our discussion, daughter had no other questions

## 2018-11-02 NOTE — Progress Notes (Signed)
38 Daughter states that they have now decided to not do an autopsy. She states that understands cause of death and does not have the money for a private autopsy.

## 2018-11-02 DEATH — deceased

## 2018-11-21 ENCOUNTER — Other Ambulatory Visit: Payer: Self-pay | Admitting: Nurse Practitioner

## 2018-11-21 DIAGNOSIS — K7460 Unspecified cirrhosis of liver: Secondary | ICD-10-CM

## 2018-11-27 ENCOUNTER — Ambulatory Visit: Payer: Medicare Other | Admitting: Nurse Practitioner

## 2018-12-27 ENCOUNTER — Other Ambulatory Visit (HOSPITAL_COMMUNITY): Payer: Medicare Other

## 2018-12-31 ENCOUNTER — Ambulatory Visit (HOSPITAL_COMMUNITY): Admit: 2018-12-31 | Payer: Medicare Other | Admitting: Gastroenterology

## 2018-12-31 SURGERY — ESOPHAGOGASTRODUODENOSCOPY (EGD) WITH PROPOFOL
Anesthesia: Monitor Anesthesia Care

## 2019-01-02 ENCOUNTER — Ambulatory Visit (HOSPITAL_COMMUNITY): Payer: Medicare Other | Admitting: Psychiatry

## 2020-07-09 IMAGING — DX DG LUMBAR SPINE COMPLETE 4+V
5 series · 5 of 5 positions shown · non-contrast
Comparison: CT 10/22/2017

CLINICAL DATA: Radicular pain

EXAM:
LUMBAR SPINE - COMPLETE 4+ VIEW

[l-spine ap]
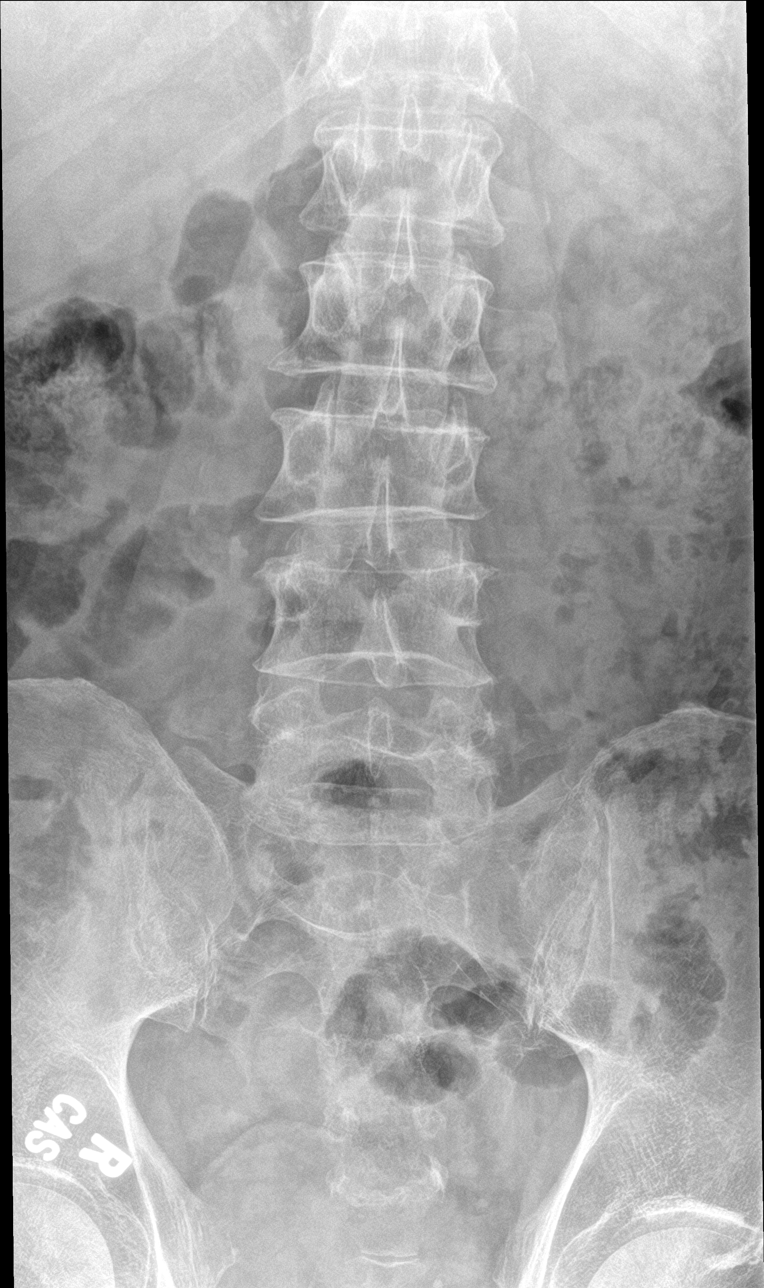

[l-spine obl (1 of 2)]
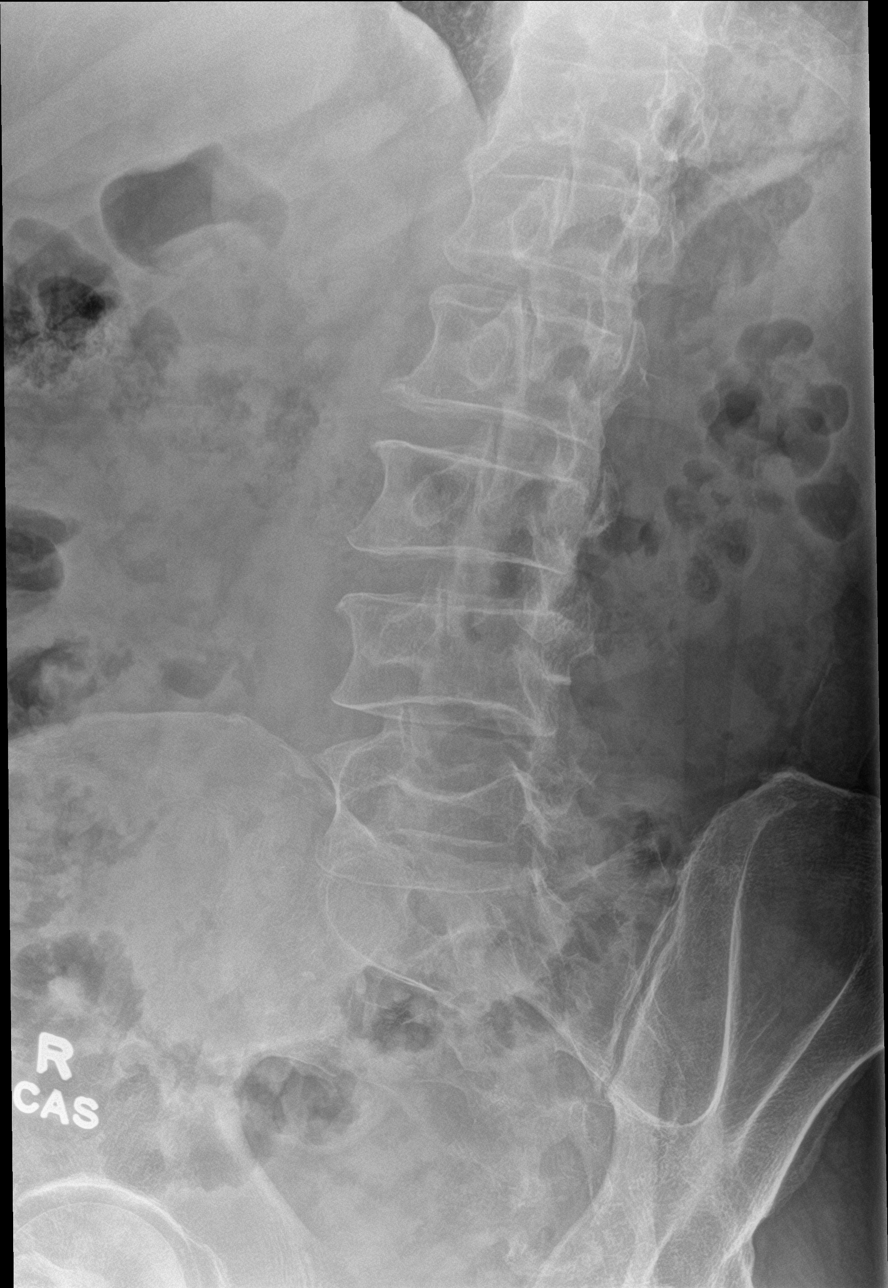

[l-spine obl (2 of 2)]
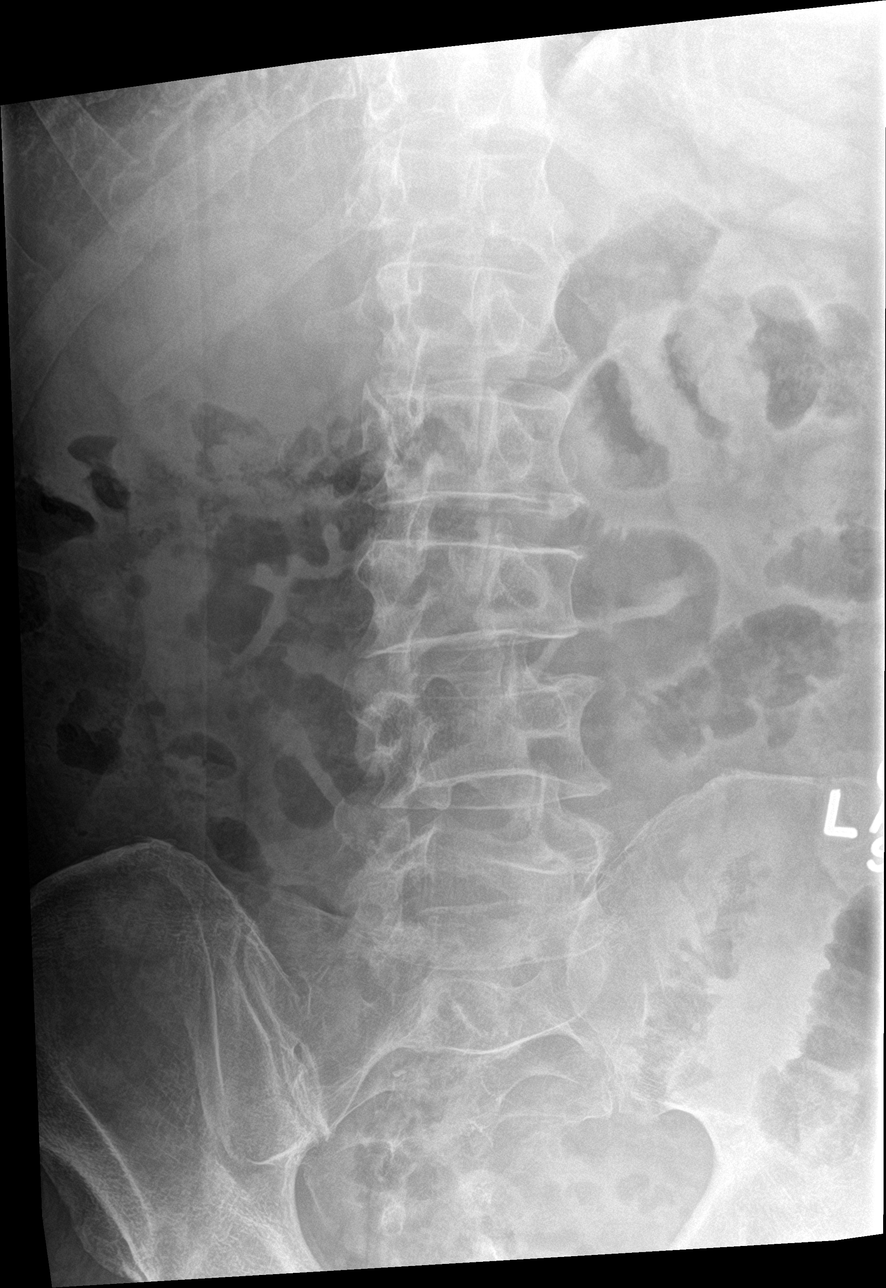

[l-spine lat]
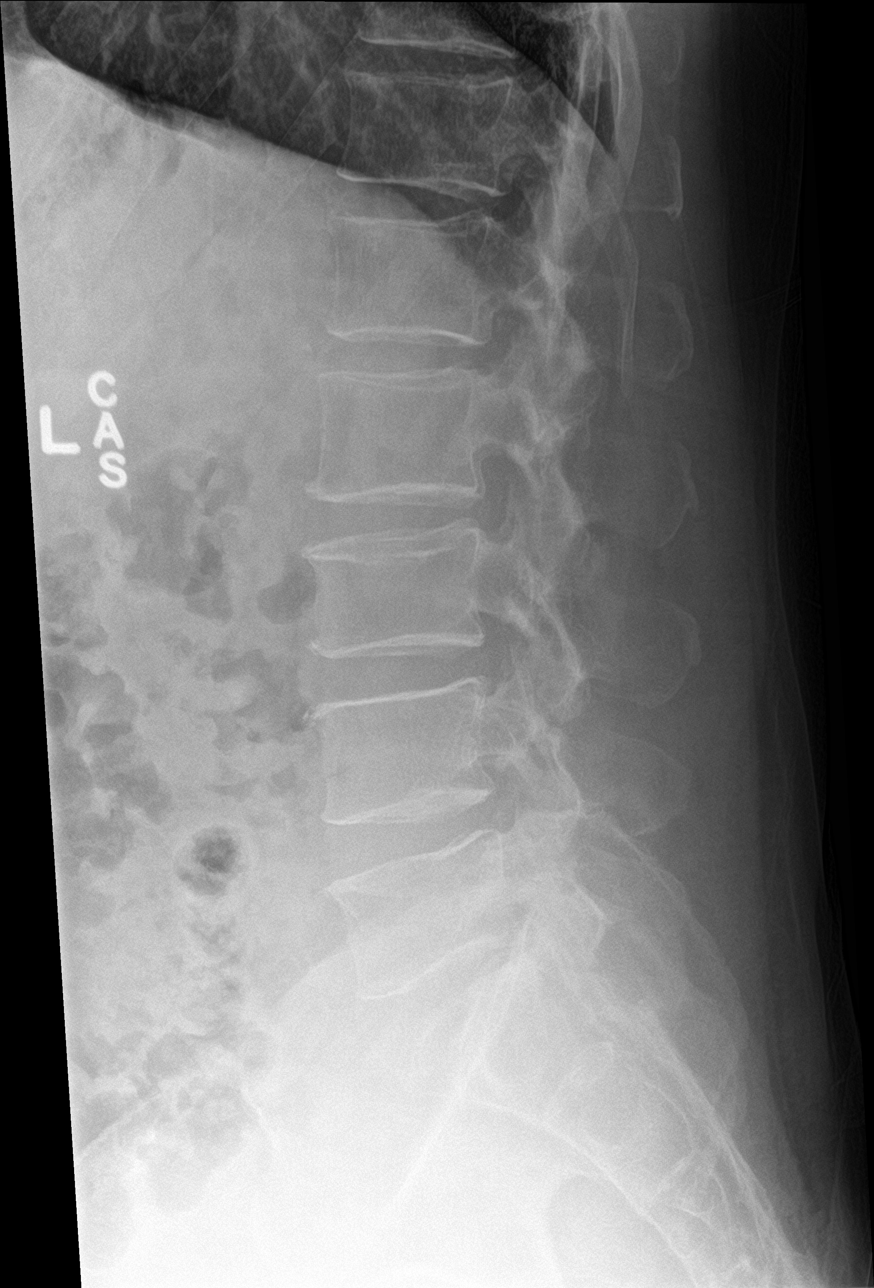

[l-spine spot]
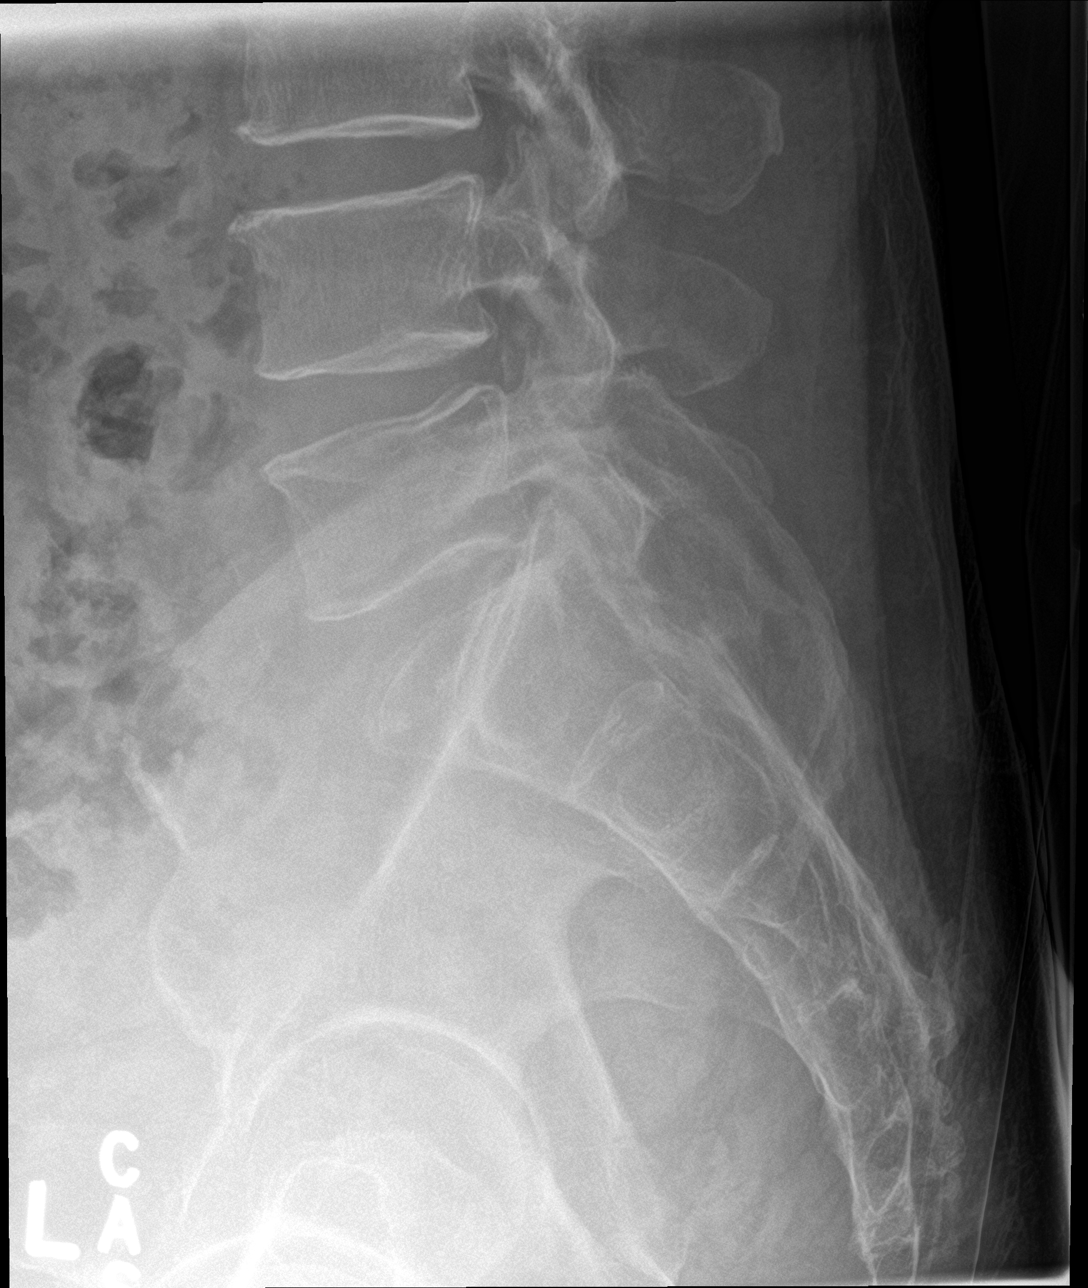

[5 of 5 positions shown; findings below may reference images not displayed]

FINDINGS: Five non rib-bearing lumbar type vertebra. Vertebral body heights
are maintained. Disc spaces are preserved. Minimal anterior
osteophyte at L2-L3, L3-L4.
IMPRESSION: Minimal degenerative change.  No acute osseous abnormality.
